# Patient Record
Sex: Female | Born: 1990 | Race: White | Hispanic: Yes | Marital: Single | Smoking: Former smoker
Health system: Southern US, Community
[De-identification: ages and names within clinical notes are randomized; demographics above are authoritative.]

## PROBLEM LIST (undated history)

## (undated) DIAGNOSIS — F909 Attention-deficit hyperactivity disorder, unspecified type: Secondary | ICD-10-CM

## (undated) DIAGNOSIS — F32A Depression, unspecified: Secondary | ICD-10-CM

## (undated) DIAGNOSIS — K311 Adult hypertrophic pyloric stenosis: Secondary | ICD-10-CM

## (undated) DIAGNOSIS — F4310 Post-traumatic stress disorder, unspecified: Secondary | ICD-10-CM

## (undated) DIAGNOSIS — M797 Fibromyalgia: Secondary | ICD-10-CM

## (undated) DIAGNOSIS — K760 Fatty (change of) liver, not elsewhere classified: Secondary | ICD-10-CM

## (undated) DIAGNOSIS — M40202 Unspecified kyphosis, cervical region: Secondary | ICD-10-CM

## (undated) DIAGNOSIS — F329 Major depressive disorder, single episode, unspecified: Secondary | ICD-10-CM

## (undated) DIAGNOSIS — F429 Obsessive-compulsive disorder, unspecified: Secondary | ICD-10-CM

## (undated) DIAGNOSIS — F609 Personality disorder, unspecified: Secondary | ICD-10-CM

## (undated) DIAGNOSIS — F419 Anxiety disorder, unspecified: Secondary | ICD-10-CM

## (undated) DIAGNOSIS — R5600 Simple febrile convulsions: Secondary | ICD-10-CM

## (undated) DIAGNOSIS — F319 Bipolar disorder, unspecified: Secondary | ICD-10-CM

## (undated) DIAGNOSIS — I341 Nonrheumatic mitral (valve) prolapse: Secondary | ICD-10-CM

## (undated) HISTORY — DX: Fibromyalgia: M79.7

## (undated) HISTORY — PX: OTHER SURGICAL HISTORY: SHX169

## (undated) HISTORY — DX: Personality disorder, unspecified: F60.9

## (undated) HISTORY — DX: Adult hypertrophic pyloric stenosis: K31.1

## (undated) HISTORY — DX: Unspecified kyphosis, cervical region: M40.202

## (undated) HISTORY — DX: Attention-deficit hyperactivity disorder, unspecified type: F90.9

---

## 2014-02-17 HISTORY — PX: APPENDECTOMY: SHX54

## 2017-07-10 ENCOUNTER — Other Ambulatory Visit: Payer: Self-pay

## 2017-07-10 ENCOUNTER — Emergency Department
Admission: EM | Admit: 2017-07-10 | Discharge: 2017-07-10 | Disposition: A | Discharge status: Home / Self Care | Payer: Medicaid Other | Attending: Emergency Medicine | Admitting: Emergency Medicine

## 2017-07-10 ENCOUNTER — Encounter: Payer: Self-pay | Admitting: Emergency Medicine

## 2017-07-10 DIAGNOSIS — F319 Bipolar disorder, unspecified: Secondary | ICD-10-CM | POA: Diagnosis not present

## 2017-07-10 DIAGNOSIS — F4310 Post-traumatic stress disorder, unspecified: Secondary | ICD-10-CM

## 2017-07-10 DIAGNOSIS — F419 Anxiety disorder, unspecified: Secondary | ICD-10-CM

## 2017-07-10 DIAGNOSIS — Z760 Encounter for issue of repeat prescription: Secondary | ICD-10-CM

## 2017-07-10 DIAGNOSIS — F4312 Post-traumatic stress disorder, chronic: Secondary | ICD-10-CM | POA: Insufficient documentation

## 2017-07-10 DIAGNOSIS — F429 Obsessive-compulsive disorder, unspecified: Secondary | ICD-10-CM

## 2017-07-10 HISTORY — DX: Anxiety disorder, unspecified: F41.9

## 2017-07-10 HISTORY — DX: Bipolar disorder, unspecified: F31.9

## 2017-07-10 HISTORY — DX: Obsessive-compulsive disorder, unspecified: F42.9

## 2017-07-10 HISTORY — DX: Post-traumatic stress disorder, unspecified: F43.10

## 2017-07-10 MED ORDER — GABAPENTIN 400 MG PO CAPS: 400.0000 mg | ORAL_CAPSULE | Freq: Three times a day (TID) | ORAL | 2 refills | Status: DC

## 2017-07-10 MED ORDER — ATOMOXETINE HCL 25 MG PO CAPS: 25.0000 mg | ORAL_CAPSULE | Freq: Every day | ORAL | 2 refills | Status: DC

## 2017-07-10 MED ORDER — RISPERIDONE 2 MG PO TABS: 2.0000 mg | ORAL_TABLET | Freq: Every day | ORAL | 2 refills | Status: DC

## 2017-07-10 NOTE — ED Notes (Signed)
Pateint needs medication refill on gabapentin 400 mg TID, risperidone 2mg  at bedtime, atomoxetine 25 mg QD.

## 2017-07-10 NOTE — ED Triage Notes (Signed)
Arrive for medication refill.  States has appointment set up for February 1st with [facility] [place].

## 2017-07-10 NOTE — ED Provider Notes (Signed)
[facility] Emergency Department Provider Note  ____________________________________________  Time seen: Approximately 4:22 PM  I have reviewed the triage vital signs and the nursing notes.   HISTORY  Chief Complaint Medication Refill    HPI Courtney [name] is a 26 y.o. female who presents the emergency department requesting medication refill.  Patient needs medication refill on gabapentin 400 mg TID, risperidone 2mg  at bedtime, atomoxetine 25 mg QD. Patient just moved from [place], is establishing care with [facility] but the first available appointment was February 1.  Patient reports that she will be out of her medications prior to this time.  Patient reports that she has been on this dose for long-term with good effects. Patient has a history of anxiety, bipolar disorder, OCD, PTSD.  Patient denies any suicidal or homicidal ideations.  No other complaints at this time.    Past Medical History:  Diagnosis Date  . Anxiety   . Bipolar 1 disorder (HCC)   . OCD (obsessive compulsive disorder)   . PTSD (post-traumatic stress disorder)     There are no active problems to display for this patient.   History reviewed. No pertinent surgical history.  Prior to Admission medications   Medication Sig Start Date End Date Taking? Authorizing Provider  atomoxetine (STRATTERA) 25 MG capsule Take 1 capsule (25 mg total) by mouth daily. 07/10/17 07/10/18  Kanen Mottola, Delorise Royals, PA-C  gabapentin (NEURONTIN) 400 MG capsule Take 1 capsule (400 mg total) by mouth 3 (three) times daily. 07/10/17 07/10/18  Genita Nilsson, Delorise Royals, PA-C  risperiDONE (RISPERDAL) 2 MG tablet Take 1 tablet (2 mg total) by mouth at bedtime. 07/10/17 07/10/18  Tyreisha Ungar, Delorise Royals, PA-C    Allergies Concerta [methylphenidate hcl er (cd)] and Vyvanse [lisdexamfetamine]  No family history on file.  Social History Social History   Tobacco Use  . Smoking status: Never Smoker  . Smokeless tobacco:  Never Used  Substance Use Topics  . Alcohol use: Not on file  . Drug use: Not on file     Review of Systems  Constitutional: No fever/chills Eyes: No visual changes.  Cardiovascular: no chest pain. Respiratory: no cough. No SOB. Gastrointestinal: No abdominal pain.  No nausea, no vomiting.  No diarrhea.  No constipation. Musculoskeletal: Negative for musculoskeletal pain. Skin: Negative for rash, abrasions, lacerations, ecchymosis. Neurological: Negative for headaches, focal weakness or numbness. Psychological: History of bipolar, OCD, PTSD, anxiety.  Patient denies any suicidal or homicidal ideations.  No complaints other than necessary medication refill. 10-point ROS otherwise negative.  ____________________________________________   PHYSICAL EXAM:  VITAL SIGNS: ED Triage Vitals [07/10/17 1540]  Enc Vitals Group     BP      Pulse      Resp      Temp      Temp src      SpO2      Weight 140 lb (63.5 kg)     Height 5\' 1"  (1.549 m)     Head Circumference      Peak Flow      Pain Score      Pain Loc      Pain Edu?      Excl. in GC?      Constitutional: Alert and oriented. Well appearing and in no acute distress. Eyes: Conjunctivae are normal. PERRL. EOMI. Head: Atraumatic. ENT:      Ears:       Nose: No congestion/rhinnorhea.      Mouth/Throat: Mucous membranes are moist.  Neck: No  stridor.    Cardiovascular: Normal rate, regular rhythm. Normal S1 and S2.  Good peripheral circulation. Respiratory: Normal respiratory effort without tachypnea or retractions. Lungs CTAB. Good air entry to the bases with no decreased or absent breath sounds. Musculoskeletal: Full range of motion to all extremities. No gross deformities appreciated. Neurologic:  Normal speech and language. No gross focal neurologic deficits are appreciated.  Skin:  Skin is warm, dry and intact. No rash noted. Psychiatric: Mood and affect are normal. Speech and behavior are normal. Patient exhibits  appropriate insight and judgement.   ____________________________________________   LABS (all labs ordered are listed, but only abnormal results are displayed)  Labs Reviewed - No data to display ____________________________________________  EKG   ____________________________________________  RADIOLOGY   No results found.  ____________________________________________    PROCEDURES  Procedure(s) performed:    Procedures    Medications - No data to display   ____________________________________________   INITIAL IMPRESSION / ASSESSMENT AND PLAN / ED COURSE  Pertinent labs & imaging results that were available during my care of the patient were reviewed by me and considered in my medical decision making (see chart for details).  Review of the [place] CSRS was performed in accordance of the [facility] prior to dispensing any controlled drugs.     Patient's diagnosis is consistent with medication refill.  Patient has no complaints just needs medication refill until she can establish with new care.  Patient just [place] from [place] Patient will be discharged home with prescriptions for Strattera, Neurontin, Risperdal. Patient is to follow up with psychiatry to establish new care or primary care as needed or otherwise directed. Patient is given ED precautions to return to the ED for any worsening or new symptoms.     ____________________________________________  FINAL CLINICAL IMPRESSION(S) / ED DIAGNOSES  Final diagnoses:  Medication refill  Anxiety  Bipolar 1 disorder (HCC)  Obsessive-compulsive disorder, unspecified type  PTSD (post-traumatic stress disorder)      NEW MEDICATIONS STARTED DURING THIS VISIT:  ED Discharge Orders        Ordered    gabapentin (NEURONTIN) 400 MG capsule  3 times daily     07/10/17 1648    risperiDONE (RISPERDAL) 2 MG tablet  Daily at bedtime     07/10/17 1648    atomoxetine (STRATTERA) 25 MG capsule  Daily     07/10/17  1648          This chart was dictated using voice recognition software/Dragon. Despite best efforts to proofread, errors can occur which can change the meaning. Any change was purely unintentional.    Racheal PatchesCuthriell, Mattisen Pohlmann D, PA-C 07/10/17 1656    Arnaldo NatalMalinda, Paul F, MD 07/10/17 [phone]

## 2017-10-16 LAB — LIPID PANEL
Cholesterol: 100 (ref 0–200)
HDL: 57 (ref 35–70)
LDL Cholesterol: 32
Triglycerides: 54 (ref 40–160)

## 2017-10-16 LAB — HM PAP SMEAR

## 2017-10-16 LAB — TSH: TSH: 0.23 — AB (ref 0.41–5.90)

## 2017-10-16 LAB — HEMOGLOBIN A1C: Hemoglobin A1C: 5.9

## 2017-11-10 ENCOUNTER — Other Ambulatory Visit: Payer: Self-pay

## 2017-11-10 ENCOUNTER — Encounter: Payer: Self-pay | Admitting: Emergency Medicine

## 2017-11-10 ENCOUNTER — Emergency Department
Admission: EM | Admit: 2017-11-10 | Discharge: 2017-11-10 | Disposition: A | Discharge status: Home / Self Care | Payer: Medicaid Other | Attending: Emergency Medicine | Admitting: Emergency Medicine

## 2017-11-10 DIAGNOSIS — F419 Anxiety disorder, unspecified: Secondary | ICD-10-CM | POA: Diagnosis not present

## 2017-11-10 DIAGNOSIS — F319 Bipolar disorder, unspecified: Secondary | ICD-10-CM | POA: Insufficient documentation

## 2017-11-10 DIAGNOSIS — B9689 Other specified bacterial agents as the cause of diseases classified elsewhere: Secondary | ICD-10-CM

## 2017-11-10 DIAGNOSIS — R519 Headache, unspecified: Secondary | ICD-10-CM

## 2017-11-10 DIAGNOSIS — R51 Headache: Secondary | ICD-10-CM | POA: Diagnosis not present

## 2017-11-10 DIAGNOSIS — R300 Dysuria: Secondary | ICD-10-CM | POA: Diagnosis present

## 2017-11-10 DIAGNOSIS — N760 Acute vaginitis: Secondary | ICD-10-CM | POA: Insufficient documentation

## 2017-11-10 DIAGNOSIS — Z79899 Other long term (current) drug therapy: Secondary | ICD-10-CM | POA: Insufficient documentation

## 2017-11-10 LAB — POCT PREGNANCY, URINE: Preg Test, Ur: NEGATIVE

## 2017-11-10 LAB — URINALYSIS, COMPLETE (UACMP) WITH MICROSCOPIC
Bacteria, UA: NONE SEEN
Bilirubin Urine: NEGATIVE
Glucose, UA: NEGATIVE mg/dL
Hgb urine dipstick: NEGATIVE
Ketones, ur: 5 mg/dL — AB
Leukocytes, UA: NEGATIVE
Nitrite: NEGATIVE
Protein, ur: NEGATIVE mg/dL
Specific Gravity, Urine: 1.021 (ref 1.005–1.030)
pH: 6 (ref 5.0–8.0)

## 2017-11-10 LAB — WET PREP, GENITAL
Sperm: NONE SEEN
Trich, Wet Prep: NONE SEEN
Yeast Wet Prep HPF POC: NONE SEEN

## 2017-11-10 LAB — CHLAMYDIA/NGC RT PCR (ARMC ONLY)
Chlamydia Tr: NOT DETECTED
N gonorrhoeae: NOT DETECTED

## 2017-11-10 MED ORDER — METRONIDAZOLE 500 MG PO TABS
2000.0000 mg | ORAL_TABLET | Freq: Once | ORAL | Status: AC
  Administered 2017-11-10: 2000 mg via ORAL
  Filled 2017-11-10: qty 4

## 2017-11-10 MED ORDER — ONDANSETRON 4 MG PO TBDP
4.0000 mg | ORAL_TABLET | Freq: Once | ORAL | Status: AC
  Administered 2017-11-10: 4 mg via ORAL
  Filled 2017-11-10: qty 1

## 2017-11-10 MED ORDER — BUTALBITAL-APAP-CAFFEINE 50-325-40 MG PO TABS: 1.0000 | ORAL_TABLET | Freq: Four times a day (QID) | ORAL | 0 refills | Status: DC | PRN

## 2017-11-10 NOTE — ED Triage Notes (Signed)
Presents with some dysuria and slight vaginal discharge for about 1 week  Also has had some intermittent left sided headache   No n/v/d or fever  States she gotten new  glasses in Oct

## 2017-11-10 NOTE — ED Provider Notes (Signed)
[facility] Emergency Department Provider Note   ____________________________________________   First MD Initiated Contact with Patient 11/10/17 1227     (approximate)  I have reviewed the triage vital signs and the nursing notes.   HISTORY  Chief Complaint Dysuria and Headache   HPI Courtney [name] is a 27 y.o. female who presents to the emergency department for treatment of multiple medical complaints that include dysuria, vaginal discharge, and headache. She has a long history of headaches. This one started 2 days ago.  Patient states that she has an appointment scheduled to see a neurologist.  This headache is no different than her typical headache.  She has not attempted any alleviating measures.  Vaginal discharge started approximately 1 week ago.  She has some dysuria as well.  She has had a change in sexual partner and reports unprotected intercourse.  Past Medical History:  Diagnosis Date  . Anxiety   . Bipolar 1 disorder (HCC)   . OCD (obsessive compulsive disorder)   . PTSD (post-traumatic stress disorder)     There are no active problems to display for this patient.   History reviewed. No pertinent surgical history.  Prior to Admission medications   Medication Sig Start Date End Date Taking? Authorizing Provider  amphetamine-dextroamphetamine (ADDERALL) 30 MG tablet Take 30 mg by mouth daily.   Yes [provider]  atomoxetine (STRATTERA) 25 MG capsule Take 1 capsule (25 mg total) by mouth daily. 07/10/17 07/10/18  Cuthriell, Delorise Royals, PA-C  butalbital-acetaminophen-caffeine (FIORICET, ESGIC) 50-325-40 MG tablet Take 1 tablet by mouth every 6 (six) hours as needed for headache. 11/10/17 11/10/18  Darvell Monteforte, Rulon Eisenmenger B, FNP  gabapentin (NEURONTIN) 400 MG capsule Take 1 capsule (400 mg total) by mouth 3 (three) times daily. 07/10/17 07/10/18  Cuthriell, Delorise Royals, PA-C  risperiDONE (RISPERDAL) 2 MG tablet Take 1 tablet (2 mg total) by  mouth at bedtime. 07/10/17 07/10/18  Cuthriell, Delorise Royals, PA-C    Allergies Concerta [methylphenidate hcl er (cd)] and Vyvanse [lisdexamfetamine]  No family history on file.  Social History Social History   Tobacco Use  . Smoking status: Never Smoker  . Smokeless tobacco: Never Used  Substance Use Topics  . Alcohol use: Not on file    Comment: rarely  . Drug use: Yes    Types: Marijuana    Comment: occasional    Review of Systems  Constitutional: No fever/chills Eyes: No visual changes. ENT: No sore throat. Cardiovascular: Denies chest pain. Respiratory: Denies shortness of breath. Gastrointestinal: No abdominal pain.  Occasional nausea, no vomiting.  No diarrhea.  No constipation. Genitourinary: Positive for dysuria. Musculoskeletal: Negative for back pain. Skin: Negative for rash. Neurological: Positive for headaches, negative for focal weakness or numbness. ____________________________________________   PHYSICAL EXAM:  VITAL SIGNS: ED Triage Vitals  Enc Vitals Group     BP 11/10/17 1239 98/60     Pulse Rate 11/10/17 1239 100     Resp 11/10/17 1239 20     Temp 11/10/17 1239 97.7 F (36.5 C)     Temp Source 11/10/17 1239 Oral     SpO2 11/10/17 1239 98 %     Weight 11/10/17 1222 200 lb (90.7 kg)     Height 11/10/17 1222 4\' 11"  (1.499 m)     Head Circumference --      Peak Flow --      Pain Score 11/10/17 1221 4     Pain Loc --      Pain Edu? --  Excl. in GC? --     Constitutional: Alert and oriented. Well appearing and in no acute distress. Eyes: Conjunctivae are normal. PERRL. EOMI. Head: Atraumatic. Nose: No congestion/rhinnorhea. Mouth/Throat: Mucous membranes are moist.  Oropharynx non-erythematous. Neck: No stridor.   Respiratory: Normal respiratory effort.  No retractions. Gastrointestinal: Soft and nontender. No distention. No abdominal bruits. No CVA tenderness.  Pelvic exam reveals a thin, white frothy vaginal discharge.  Cervix is  closed.  No active bleeding noted.  No cervical motion tenderness. Musculoskeletal: No lower extremity tenderness nor edema.  No joint effusions. Neurologic:  Normal speech and language. No gross focal neurologic deficits are appreciated. No gait instability. Skin:  Skin is warm, dry and intact. No rash noted. Psychiatric: Mood and affect are normal. Speech and behavior are normal.  ____________________________________________   LABS (all labs ordered are listed, but only abnormal results are displayed)  Labs Reviewed  WET PREP, GENITAL - Abnormal; Notable for the following components:      Result Value   Clue Cells Wet Prep HPF POC PRESENT (*)    WBC, Wet Prep HPF POC FEW (*)    All other components within normal limits  URINALYSIS, COMPLETE (UACMP) WITH MICROSCOPIC - Abnormal; Notable for the following components:   Color, Urine YELLOW (*)    APPearance CLEAR (*)    Ketones, ur 5 (*)    Squamous Epithelial / LPF 0-5 (*)    All other components within normal limits  CHLAMYDIA/NGC RT PCR ([facility] ONLY)  POCT PREGNANCY, URINE  POC URINE PREG, ED   ____________________________________________  EKG  Not indicated ____________________________________________  RADIOLOGY  ED MD interpretation:   Official radiology report(s): No results found.  ____________________________________________   PROCEDURES  Procedure(s) performed: None  Procedures  Critical Care performed: No  ____________________________________________   INITIAL IMPRESSION / ASSESSMENT AND PLAN / ED COURSE  As part of my medical decision making, I reviewed the following data within the electronic MEDICAL RECORD NUMBER    27 year old female presenting to the emergency department for treatment and evaluation of headache and vaginal discharge.  Headache was mentioned more as a side note then the main reason for coming to the emergency department today.  She was encouraged to follow-up with neurology  as she has already scheduled.  She was encouraged to take Tylenol or ibuprofen for the headache until further medication management as advised by the specialist.  Pelvic exam was completed and specimen sent to the lab.  Symptoms and exam are most likely consistent with bacterial vaginosis.  ----------------------------------------- 3:09 PM on 11/10/2017 ----------------------------------------- Wet prep results consistent with symptoms and exam.  She was given 2 g of Flagyl plus Zofran while here in the emergency department. She was advised to return to the ER for symptoms of concern if unable to schedule an appointment with the health department. ____________________________________________   FINAL CLINICAL IMPRESSION(S) / ED DIAGNOSES  Final diagnoses:  Bacterial vaginosis  Nonintractable headache, unspecified chronicity pattern, unspecified headache type     ED Discharge Orders        Ordered    butalbital-acetaminophen-caffeine (FIORICET, ESGIC) 50-325-40 MG tablet  Every 6 hours PRN     11/10/17 1455       Note:  This document was prepared using Dragon voice recognition software and may include unintentional dictation errors.    Chinita Pester, FNP 11/10/17 1512    [name], MD 11/10/17 [phone]

## 2017-12-31 ENCOUNTER — Ambulatory Visit: Payer: Medicaid Other | Admitting: Family Medicine

## 2017-12-31 ENCOUNTER — Encounter: Payer: Self-pay | Admitting: Family Medicine

## 2017-12-31 VITALS — BP 108/62 | HR 93 | Temp 98.1°F | Resp 16 | Ht 60.0 in | Wt 196.4 lb

## 2017-12-31 DIAGNOSIS — F429 Obsessive-compulsive disorder, unspecified: Secondary | ICD-10-CM | POA: Insufficient documentation

## 2017-12-31 DIAGNOSIS — F419 Anxiety disorder, unspecified: Secondary | ICD-10-CM | POA: Insufficient documentation

## 2017-12-31 DIAGNOSIS — F902 Attention-deficit hyperactivity disorder, combined type: Secondary | ICD-10-CM | POA: Insufficient documentation

## 2017-12-31 DIAGNOSIS — B372 Candidiasis of skin and nail: Secondary | ICD-10-CM

## 2017-12-31 DIAGNOSIS — F312 Bipolar disorder, current episode manic severe with psychotic features: Secondary | ICD-10-CM | POA: Insufficient documentation

## 2017-12-31 DIAGNOSIS — Z79899 Other long term (current) drug therapy: Secondary | ICD-10-CM

## 2017-12-31 DIAGNOSIS — K760 Fatty (change of) liver, not elsewhere classified: Secondary | ICD-10-CM | POA: Diagnosis not present

## 2017-12-31 DIAGNOSIS — K5909 Other constipation: Secondary | ICD-10-CM

## 2017-12-31 DIAGNOSIS — Z23 Encounter for immunization: Secondary | ICD-10-CM

## 2017-12-31 DIAGNOSIS — R1031 Right lower quadrant pain: Secondary | ICD-10-CM

## 2017-12-31 DIAGNOSIS — F909 Attention-deficit hyperactivity disorder, unspecified type: Secondary | ICD-10-CM | POA: Insufficient documentation

## 2017-12-31 DIAGNOSIS — F4310 Post-traumatic stress disorder, unspecified: Secondary | ICD-10-CM | POA: Insufficient documentation

## 2017-12-31 DIAGNOSIS — M797 Fibromyalgia: Secondary | ICD-10-CM | POA: Diagnosis not present

## 2017-12-31 DIAGNOSIS — F319 Bipolar disorder, unspecified: Secondary | ICD-10-CM

## 2017-12-31 DIAGNOSIS — Z13220 Encounter for screening for lipoid disorders: Secondary | ICD-10-CM

## 2017-12-31 DIAGNOSIS — R7303 Prediabetes: Secondary | ICD-10-CM

## 2017-12-31 MED ORDER — KETOCONAZOLE 2 % EX SHAM: 1.0000 "application " | MEDICATED_SHAMPOO | Freq: Every day | CUTANEOUS | 0 refills | Status: DC

## 2017-12-31 NOTE — Progress Notes (Signed)
Name: Courtney [name]   MRN: [id number]    DOB: 1990/10/30   Date:12/31/2017       Progress Note  Subjective  Chief Complaint  Chief Complaint  Patient presents with  . Establish Care    HPI  Morbid obesity: weight started to creep up at age 27, max weight of 237 lbs in 2015, lost while homeless. January  2017 down to 157 lbs, she has been gaining weight again, pre-diabetes and would like to go back on Adipex, explained that we do not prescribe that, but we can refer her to dietician and consider saxenda or victoza. - she denies family history of thyroid cancer or personal history of pancreatitis   Abdominal pain/chronic constipation: she has intermittent abdominal pain, still daily, it can be RUQ, RLQ, also has dyspepsia, bloating, epigastric, burning sensation on anterior chest and some nausea. She also has constipation and strain to have bowel movements, bowel movements every other day, [place] can vary, but currently 3-4. She has a history of black stools but not over the past  6 months.   Bipolar disorder: seeing psychiatrist at [facility] , Dr. Arlester Marker, compliant with medication, she states currently not sleeping well, feels like having hypomania, denies suicidal thoughts or ideation.   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 She was getting medication from psychiatrist in the past. Advised to discuss it with current psychiatrist.   Pre-diabetes: she had labs done at [facility] back in March and it was 5.9%. Denies polyphagia - eats because she gets bored, she has  polydipsia and  Polyuria, states her medications makes her feel thirsty all the time.    Patient Active Problem List   Diagnosis Date Noted  . PTSD (post-traumatic stress disorder) 12/31/2017  . Bipolar 1 disorder (HCC) 12/31/2017  . Anxiety 12/31/2017  . OCD (obsessive compulsive disorder) 12/31/2017  . ADHD 12/31/2017    Past Surgical  History:  Procedure Laterality Date  . APPENDECTOMY  02/17/2014    Family History  Problem Relation Age of Onset  . COPD Mother   . Hypertension Mother   . Asthma Mother   . Arthritis Mother   . Pancreatic cancer Mother        slow growing  . ADD / ADHD Mother   . Bipolar disorder Father   . Arthritis Father   . Hypercholesterolemia Father   . Asthma Brother   . Hernia Brother   . ADD / ADHD Brother   . Hypertension Maternal Grandmother   . Heart disease Maternal Grandmother 77  . Rheumatic fever Maternal Grandmother   . Heart attack Maternal Grandmother        Bypass Surgery  . Pancreatic cancer Maternal Grandfather   . Liver cancer Maternal Grandfather   . Obesity Paternal Grandmother     Social History   Socioeconomic History  . Marital status: Single    Spouse name: Not on file  . Number of children: 0  . Years of education: Not on file  . Highest education level: Not on file  Occupational History  . Occupation: disability     Comment: mental health   Social Needs  . Financial resource strain: Somewhat hard  . Food insecurity:    Worry: Never true    Inability: Never true  . Transportation needs:    Medical: No    Non-medical: No  Tobacco Use  . Smoking status: Light Tobacco Smoker  Years: 10.00    Types: Cigarettes    Start date: 01/01/2008  . Smokeless tobacco: Never Used  . Tobacco comment: smokes very occasionally-social smoker or stressed out  Substance and Sexual Activity  . Alcohol use: Yes    Comment: occasionally drink a beer  . Drug use: Yes    Types: Marijuana  . Sexual activity: Yes    Partners: Male    Birth control/protection: Condom  Lifestyle  . Physical activity:    Days per week: 5 days    Minutes per session: 60 min  . Stress: Rather much  Relationships  . Social connections:    Talks on phone: More than three times a week    Gets together: Three times a week    Attends religious service: More than 4 times per year     Active member of club or organization: Yes    Attends meetings of clubs or organizations: More than 4 times per year    Relationship status: Never married  . Intimate partner violence:    Fear of current or ex partner: No    Emotionally abused: No    Physically abused: No    Forced sexual activity: No  Other Topics Concern  . Not on file  Social History Narrative   Moved to [place] from [facility] to be closer to family. Parents moved here in 2018.   On disability for bipolar disorder since young age, had to be placed in a group home and foster home because of behavior. She is now living with parents since she decided to take her medications.      Current Outpatient Medications:  .  amphetamine-dextroamphetamine (ADDERALL XR) 10 MG 24 hr capsule, Take 1 capsule by mouth daily., Disp: , Rfl:  .  atomoxetine (STRATTERA) 40 MG capsule, Take 1 capsule by mouth daily., Disp: , Rfl:  .  hydrOXYzine (ATARAX/VISTARIL) 25 MG tablet, Take 25-50 mg by mouth at bedtime as needed for anxiety (Insomnia)., Disp: , Rfl:  .  lamoTRIgine (LAMICTAL) 25 MG tablet, Take 2 tablets by mouth daily., Disp: , Rfl:  .  Omega-3 Fatty Acids (FISH OIL) 500 MG CAPS, Take 1 capsule by mouth daily., Disp: , Rfl:  .  risperiDONE (RISPERDAL) 1 MG tablet, Take 3 tablets by mouth at bedtime., Disp: , Rfl:   Allergies  Allergen Reactions  . Concerta [Methylphenidate Hcl Er (Cd)] Other (See Comments)    Insomnia and Manic   . Vyvanse [Lisdexamfetamine] Other (See Comments)    Bounce off of the wall     ROS  Constitutional: Negative for fever , positive weight change.  Respiratory: Negative for cough and shortness of breath.   Cardiovascular: Negative for chest pain or palpitations.  Gastrointestinal: Negative for abdominal pain, no bowel changes.  Musculoskeletal: Negative for gait problem or joint swelling.  Skin: Negative for rash.  Neurological: Negative for dizziness or headache.  No other specific complaints in a  complete review of systems (except as listed in HPI above).  Objective  Vitals:   12/31/17 1107  BP: 108/62  Pulse: 93  Resp: 16  Temp: 98.1 F (36.7 C)  TempSrc: Oral  SpO2: 97%  Weight: 196 lb 6.4 oz (89.1 kg)  Height: 5' (1.524 m)    Body mass index is 38.36 kg/m.  Physical Exam  Constitutional: Patient appears well-developed and well-nourished. Obese  No distress.  HEENT: head atraumatic, normocephalic, pupils equal and reactive to light,  neck supple, throat within normal limits Cardiovascular: Normal  rate, regular rhythm and normal heart sounds.  No murmur heard. No BLE edema. Pulmonary/Chest: Effort normal and breath sounds normal. No respiratory distress. Abdominal: Soft.  There is no tenderness Muscular skeletal: some trigger points positive  Psychiatric: Patient has a normal mood and affect. behavior is normal. Judgment and thought content normal.  Recent Results (from the past 2160 hour(s))  Urinalysis, Complete w Microscopic     Status: Abnormal   Collection Time: 11/10/17 12:43 PM  Result Value Ref Range   Color, Urine YELLOW (A) YELLOW   APPearance CLEAR (A) CLEAR   Specific Gravity, Urine 1.021 1.005 - 1.030   pH 6.0 5.0 - 8.0   Glucose, UA NEGATIVE NEGATIVE mg/dL   Hgb urine dipstick NEGATIVE NEGATIVE   Bilirubin Urine NEGATIVE NEGATIVE   Ketones, ur 5 (A) NEGATIVE mg/dL   Protein, ur NEGATIVE NEGATIVE mg/dL   Nitrite NEGATIVE NEGATIVE   Leukocytes, UA NEGATIVE NEGATIVE   RBC / HPF 6-30 0 - 5 RBC/hpf   WBC, UA 0-5 0 - 5 WBC/hpf   Bacteria, UA NONE SEEN NONE SEEN   Squamous Epithelial / LPF 0-5 (A) NONE SEEN   Mucus PRESENT     Comment: Performed at [facility], [address] [place], [place] [address]  Pregnancy, urine POC     Status: None   Collection Time: 11/10/17 12:47 PM  Result Value Ref Range   Preg Test, Ur NEGATIVE NEGATIVE    Comment:        THE SENSITIVITY OF THIS METHODOLOGY IS >24 mIU/mL   Wet prep, genital      Status: Abnormal   Collection Time: 11/10/17  1:55 PM  Result Value Ref Range   Yeast Wet Prep HPF POC NONE SEEN NONE SEEN   Trich, Wet Prep NONE SEEN NONE SEEN   Clue Cells Wet Prep HPF POC PRESENT (A) NONE SEEN   WBC, Wet Prep HPF POC FEW (A) NONE SEEN   Sperm NONE SEEN     Comment: Performed at [facility], [address] [place], [place] [address]  Chlamydia/NGC rt PCR ([facility] only)     Status: None   Collection Time: 11/10/17  1:55 PM  Result Value Ref Range   Specimen source GC/Chlam ENDOCERVICAL    Chlamydia Tr NOT DETECTED NOT DETECTED   N gonorrhoeae NOT DETECTED NOT DETECTED    Comment: (NOTE) 100  This methodology has not been evaluated in pregnant women or in 200  patients with a history of hysterectomy. 300 400  This methodology will not be performed on patients less than 65  years of age. Performed at [facility], [address] [place], [place] [address]       PHQ2/9: Depression screen [facility] 2/9 12/31/2017  Decreased Interest 1  Down, Depressed, Hopeless 0  PHQ - 2 Score 1  Altered sleeping 3  Tired, decreased energy 1  Change in appetite 2  Feeling bad or failure about yourself  3  Trouble concentrating 3  Moving slowly or fidgety/restless 3  Suicidal thoughts 2  PHQ-9 Score 18  Difficult doing work/chores Extremely dIfficult   Seeing psychiatrist    Fall Risk: Fall Risk  12/31/2017  Falls in the past year? No     Functional Status Survey: Is the patient deaf or have difficulty hearing?: Yes(clogged ears) Does the patient have difficulty seeing, even when wearing glasses/contacts?: Yes(prescription glasses-trouble seeing a couple of months after getting glasses) Does the patient have difficulty concentrating, remembering, or making  decisions?: Yes Does the patient have difficulty walking or climbing stairs?: No Does the patient have difficulty dressing or bathing?: No Does the patient have difficulty doing errands alone such  as visiting a doctor's office or shopping?: No    Assessment & Plan  1. Bipolar 1 disorder (HCC)  Continue   2. Morbid obesity (HCC)  - Amb ref to Medical Nutrition Therapy-MNT Discussed with the patient the risk posed by an increased BMI. Discussed importance of portion control, calorie counting and at least 150 minutes of physical activity weekly. Avoid sweet beverages and drink more water. Eat at least 6 servings of fruit and vegetables daily   3. Candidal intertrigo  - ketoconazole (NIZORAL) 2 % shampoo; Apply 1 application topically at bedtime. Apply to skin and remove after 5 minutes  Dispense: 120 mL; Refill: 0  4. Chronic constipation  - Ambulatory referral to Gastroenterology  5. Intermittent right lower quadrant abdominal pain  - Ambulatory referral to Gastroenterology  6. Fatty liver disease, nonalcoholic  - Ambulatory referral to Gastroenterology  7. Long-term use of high-risk medication  Reviewed labs with patient done 10/2017 at [facility]   8. Prediabetes   - Hemoglobin A1c  9. Lipid screening  - Lipid panel  10. Need for HPV vaccination  - HPV 9-valent vaccine,Recombinat  11. Need for Tdap vaccination  She states she had it recently

## 2017-12-31 NOTE — Patient Instructions (Signed)
Obesity, Adult  Obesity is the condition of having too much total body fat. Being overweight or obese means that your weight is greater than what is considered healthy for your body size. Obesity is determined by a measurement called BMI. BMI is an estimate of body fat and is calculated from height and weight. For adults, a BMI of 30 or higher is considered obese.  Obesity can eventually lead to other health concerns and major illnesses, including:  · Stroke.  · Coronary artery disease (CAD).  · Type 2 diabetes.  · Some types of cancer, including cancers of the colon, breast, uterus, and gallbladder.  · Osteoarthritis.  · High blood pressure (hypertension).  · High cholesterol.  · Sleep apnea.  · Gallbladder stones.  · Infertility problems.    What are the causes?  The main cause of obesity is taking in (consuming) more calories than your body uses for energy. Other factors that contribute to this condition may include:  · Being born with genes that make you more likely to become obese.  · Having a medical condition that causes obesity. These conditions include:  ? Hypothyroidism.  ? Polycystic ovarian syndrome (PCOS).  ? Binge-eating disorder.  ? Cushing syndrome.  · Taking certain medicines, such as steroids, antidepressants, and seizure medicines.  · Not being physically active (sedentary lifestyle).  · Living where there are limited places to exercise safely or buy healthy foods.  · Not getting enough sleep.    What increases the risk?  The following factors may increase your risk of this condition:  · Having a family history of obesity.  · Being a woman of African-American descent.  · Being a man of Hispanic descent.    What are the signs or symptoms?  Having excessive body fat is the main symptom of this condition.  How is this diagnosed?  This condition may be diagnosed based on:  · Your symptoms.  · Your medical history.  · A physical exam. Your health care provider may measure:  ? Your BMI. If you are an  adult with a BMI between 25 and less than 30, you are considered overweight. If you are an adult with a BMI of 30 or higher, you are considered obese.  ? The distances around your hips and your waist (circumferences). These may be compared to each other to help diagnose your condition.  ? Your skinfold thickness. Your health care provider may gently pinch a fold of your skin and measure it.    How is this treated?  Treatment for this condition often includes changing your lifestyle. Treatment may include some or all of the following:  · Dietary changes. Work with your health care provider and a dietitian to set a weight-loss goal that is healthy and reasonable for you. Dietary changes may include eating:  ? Smaller portions. A portion size is the amount of a particular food that is healthy for you to eat at one time. This varies from person to person.  ? Low-calorie or low-fat options.  ? More whole grains, fruits, and vegetables.  · Regular physical activity. This may include aerobic activity (cardio) and strength training.  · Medicine to help you lose weight. Your health care provider may prescribe medicine if you are unable to lose 1 pound a week after 6 weeks of eating more healthily and doing more physical activity.  · Surgery. Surgical options may include gastric banding and gastric bypass. Surgery may be done if:  ? Other   treatments have not helped to improve your condition.  ? You have a BMI of 40 or higher.  ? You have life-threatening health problems related to obesity.    Follow these instructions at home:    Eating and drinking    · Follow recommendations from your health care provider about what you eat and drink. Your health care provider may advise you to:  ? Limit fast foods, sweets, and processed snack foods.  ? Choose low-fat options, such as low-fat milk instead of whole milk.  ? Eat 5 or more servings of fruits or vegetables every day.  ? Eat at home more often. This gives you more control over  what you eat.  ? Choose healthy foods when you eat out.  ? Learn what a healthy portion size is.  ? Keep low-fat snacks on hand.  ? Avoid sugary drinks, such as soda, fruit juice, iced tea sweetened with sugar, and flavored milk.  ? Eat a healthy breakfast.  · Drink enough water to keep your urine clear or pale yellow.  · Do not go without eating for long periods of time (do not fast) or follow a fad diet. Fasting and fad diets can be unhealthy and even dangerous.  Physical Activity  · Exercise regularly, as told by your health care provider. Ask your health care provider what types of exercise are safe for you and how often you should exercise.  · Warm up and stretch before being active.  · Cool down and stretch after being active.  · Rest between periods of activity.  Lifestyle  · Limit the time that you spend in front of your TV, computer, or video game system.  · Find ways to reward yourself that do not involve food.  · Limit alcohol intake to no more than 1 drink a day for nonpregnant women and 2 drinks a day for men. One drink equals 12 oz of beer, 5 oz of wine, or 1½ oz of hard liquor.  General instructions  · Keep a weight loss journal to keep track of the food you eat and how much you exercise you get.  · Take over-the-counter and prescription medicines only as told by your health care provider.  · Take vitamins and supplements only as told by your health care provider.  · Consider joining a support group. Your health care provider may be able to recommend a support group.  · Keep all follow-up visits as told by your health care provider. This is important.  Contact a health care provider if:  · You are unable to meet your weight loss goal after 6 weeks of dietary and lifestyle changes.  This information is not intended to replace advice given to you by your health care provider. Make sure you discuss any questions you have with your health care provider.  Document Released: 09/04/2004 Document Revised:  12/31/2015 Document Reviewed: 05/16/2015  Elsevier Interactive Patient Education © 2018 Elsevier Inc.

## 2018-01-01 ENCOUNTER — Encounter: Payer: Self-pay | Admitting: Family Medicine

## 2018-01-05 ENCOUNTER — Encounter: Payer: Self-pay | Admitting: Family Medicine

## 2018-01-07 ENCOUNTER — Ambulatory Visit: Payer: Medicaid Other | Admitting: Gastroenterology

## 2018-01-07 ENCOUNTER — Encounter: Payer: Self-pay | Admitting: Gastroenterology

## 2018-01-07 VITALS — BP 102/67 | HR 76 | Temp 97.7°F | Ht 60.0 in | Wt 197.4 lb

## 2018-01-07 DIAGNOSIS — R12 Heartburn: Secondary | ICD-10-CM

## 2018-01-07 DIAGNOSIS — R1013 Epigastric pain: Secondary | ICD-10-CM | POA: Diagnosis not present

## 2018-01-07 DIAGNOSIS — K760 Fatty (change of) liver, not elsewhere classified: Secondary | ICD-10-CM

## 2018-01-07 DIAGNOSIS — K5900 Constipation, unspecified: Secondary | ICD-10-CM | POA: Diagnosis not present

## 2018-01-07 MED ORDER — RANITIDINE HCL 75 MG PO TABS: 75.0000 mg | ORAL_TABLET | Freq: Two times a day (BID) | ORAL | 1 refills | Status: DC

## 2018-01-07 MED ORDER — PSYLLIUM 55.46 % PO POWD: 1.0000 | Freq: Every day | ORAL | 1 refills | Status: DC

## 2018-01-07 NOTE — Patient Instructions (Signed)
Gastroesophageal Reflux Disease, Adult Normally, food travels down the esophagus and stays in the stomach to be digested. If a person has gastroesophageal reflux disease (GERD), food and stomach acid move back up into the esophagus. When this happens, the esophagus becomes sore and swollen (inflamed). Over time, GERD can make small holes (ulcers) in the lining of the esophagus. Follow these instructions at home: Diet  Follow a diet as told by your doctor. You may need to avoid foods and drinks such as: ? Coffee and tea (with or without caffeine). ? Drinks that contain alcohol. ? Energy drinks and sports drinks. ? Carbonated drinks or sodas. ? Chocolate and cocoa. ? Peppermint and mint flavorings. ? Garlic and onions. ? Horseradish. ? Spicy and acidic foods, such as peppers, chili powder, curry powder, vinegar, hot sauces, and BBQ sauce. ? Citrus fruit juices and citrus fruits, such as oranges, lemons, and limes. ? Tomato-based foods, such as red sauce, chili, salsa, and pizza with red sauce. ? Fried and fatty foods, such as donuts, french fries, potato chips, and high-fat dressings. ? High-fat meats, such as hot dogs, rib eye steak, sausage, ham, and bacon. ? High-fat dairy items, such as whole milk, butter, and cream cheese.  Eat small meals often. Avoid eating large meals.  Avoid drinking large amounts of liquid with your meals.  Avoid eating meals during the 2-3 hours before bedtime.  Avoid lying down right after you eat.  Do not exercise right after you eat. General instructions  Pay attention to any changes in your symptoms.  Take over-the-counter and prescription medicines only as told by your doctor. Do not take aspirin, ibuprofen, or other NSAIDs unless your doctor says it is okay.  Do not use any tobacco products, including cigarettes, chewing tobacco, and e-cigarettes. If you need help quitting, ask your doctor.  Wear loose clothes. Do not wear anything tight around  your waist.  Raise (elevate) the head of your bed about 6 inches (15 cm).  Try to lower your stress. If you need help doing this, ask your doctor.  If you are overweight, lose an amount of weight that is healthy for you. Ask your doctor about a safe weight loss goal.  Keep all follow-up visits as told by your doctor. This is important. Contact a doctor if:  You have new symptoms.  You lose weight and you do not know why it is happening.  You have trouble swallowing, or it hurts to swallow.  You have wheezing or a cough that keeps happening.  Your symptoms do not get better with treatment.  You have a hoarse voice. Get help right away if:  You have pain in your arms, neck, jaw, teeth, or back.  You feel sweaty, dizzy, or light-headed.  You have chest pain or shortness of breath.  You throw up (vomit) and your throw up looks like blood or coffee grounds.  You pass out (faint).  Your poop (stool) is bloody or black.  You cannot swallow, drink, or eat. This information is not intended to replace advice given to you by your health care provider. Make sure you discuss any questions you have with your health care provider. Document Released: 01/14/2008 Document Revised: 01/03/2016 Document Reviewed: 11/22/2014 Elsevier Interactive Patient Education  2018 [employer]. High-Fiber Diet Fiber, also called dietary fiber, is a type of carbohydrate found in fruits, vegetables, whole grains, and beans. A high-fiber diet can have many health benefits. Your health care provider may recommend a high-fiber diet  to help:  Prevent constipation. Fiber can make your bowel movements more regular.  Lower your cholesterol.  Relieve hemorrhoids, uncomplicated diverticulosis, or irritable bowel syndrome.  Prevent overeating as part of a weight-loss plan.  Prevent heart disease, type 2 diabetes, and certain cancers.  What is my plan? The recommended daily intake of fiber includes:  38  grams for men under age 43.  30 grams for men over age 91.  25 grams for women under age 6.  21 grams for women over age 68.  You can get the recommended daily intake of dietary fiber by eating a variety of fruits, vegetables, grains, and beans. Your health care provider may also recommend a fiber supplement if it is not possible to get enough fiber through your diet. What do I need to know about a high-fiber diet?  Fiber supplements have not been widely studied for their effectiveness, so it is better to get fiber through food sources.  Always check the fiber content on thenutrition facts label of any prepackaged food. Look for foods that contain at least 5 grams of fiber per serving.  Ask your dietitian if you have questions about specific foods that are related to your condition, especially if those foods are not listed in the following section.  Increase your daily fiber consumption gradually. Increasing your intake of dietary fiber too quickly may cause bloating, cramping, or gas.  Drink plenty of water. Water helps you to digest fiber. What foods can I eat? Grains Whole-grain breads. Multigrain cereal. Oats and oatmeal. Brown rice. Barley. Bulgur wheat. Millet. Bran muffins. Popcorn. Rye wafer crackers. Vegetables Sweet potatoes. Spinach. Kale. Artichokes. Cabbage. Broccoli. Green peas. Carrots. Squash. Fruits Berries. Pears. Apples. Oranges. Avocados. Prunes and raisins. Dried figs. Meats and Other Protein Sources Navy, kidney, pinto, and soy beans. Split peas. Lentils. Nuts and seeds. Dairy Fiber-fortified yogurt. Beverages Fiber-fortified soy milk. Fiber-fortified orange juice. Other Fiber bars. The items listed above may not be a complete list of recommended foods or beverages. Contact your dietitian for more options. What foods are not recommended? Grains White bread. Pasta made with refined flour. White rice. Vegetables Fried potatoes. Canned vegetables.  Well-cooked vegetables. Fruits Fruit juice. Cooked, strained fruit. Meats and Other Protein Sources Fatty cuts of meat. Fried Environmental education officer or fried fish. Dairy Milk. Yogurt. Cream cheese. Sour cream. Beverages Soft drinks. Other Cakes and pastries. Butter and oils. The items listed above may not be a complete list of foods and beverages to avoid. Contact your dietitian for more information. What are some tips for including high-fiber foods in my diet?  Eat a wide variety of high-fiber foods.  Make sure that half of all grains consumed each day are whole grains.  Replace breads and cereals made from refined flour or white flour with whole-grain breads and cereals.  Replace white rice with brown rice, bulgur wheat, or millet.  Start the day with a breakfast that is high in fiber, such as a cereal that contains at least 5 grams of fiber per serving.  Use beans in place of meat in soups, salads, or pasta.  Eat high-fiber snacks, such as berries, raw vegetables, nuts, or popcorn. This information is not intended to replace advice given to you by your health care provider. Make sure you discuss any questions you have with your health care provider. Document Released: 07/28/2005 Document Revised: 01/03/2016 Document Reviewed: 01/10/2014 Elsevier Interactive Patient Education  2018 [employer].   F/u 3 months

## 2018-01-07 NOTE — Progress Notes (Signed)
Courtney [name] [address]  [address]  [place], [place] [address]  Main: [phone]  Fax: [phone]   Gastroenterology Consultation  Referring Provider:     [name], MD Primary Care Physician:  [name], MD Primary Gastroenterologist:  Dr. Melodie [name] Reason for Consultation:     Chronic abdominal pain, fatty liver        HPI:   Chief complaint: Abdominal pain  Courtney [name] is a 27 y.o. y/o female referred for consultation & management  by Dr. [name], MD.    Reports history of chronic abdominal pain, multiple locations, midepigastric, right upper quadrant, left upper quadrant, left lower quadrant.  Cramping, 4/10, intermittent, unrelated to meals.  Reports heartburn.  Is not on any acid reducers.  Denies dysphagia.  Reports taking Protonix for 1 week about 2 years ago.  Does not know if it helped.  No nausea or vomiting.  No weight loss.  Takes magnesium for constipation, and has 1-2 loose stools every other day with it.  Patient previously lived in [place], and states she saw a GI there in 2016 due to abdominal pain.  She states an EGD and colonoscopy were scheduled, and she could not tolerate the colonoscopy prep, GoLYTELY, and her procedures were canceled.  She also reports having appendicitis at the time and underwent appendectomy.    States, she had an ultrasound of the liver by them at that time as well, and it showed fatty liver.  She was told it was due to obesity and was encouraged to lose weight.    Reports history of pyloric stenosis as a child.  No NSAID use.  Past Medical History:  Diagnosis Date  . ADHD    As a child  . Anxiety   . Bipolar 1 disorder (HCC)   . OCD (obsessive compulsive disorder)   . PTSD (post-traumatic stress disorder)   . Pyloric stenosis     Past Surgical History:  Procedure Laterality Date  . APPENDECTOMY  02/17/2014    Prior to Admission medications   Medication Sig Start Date End  Date Taking? Authorizing Provider  amphetamine-dextroamphetamine (ADDERALL XR) 10 MG 24 hr capsule Take 1 capsule by mouth daily. 12/04/17 01/03/18  [provider]  atomoxetine (STRATTERA) 40 MG capsule Take 1 capsule by mouth daily. 12/04/17 03/04/18  [provider]  hydrOXYzine (ATARAX/VISTARIL) 25 MG tablet Take 25-50 mg by mouth at bedtime as needed for anxiety (Insomnia).    [provider]  ketoconazole (NIZORAL) 2 % shampoo Apply 1 application topically at bedtime. Apply to skin and remove after 5 minutes 12/31/17   [name], MD  lamoTRIgine (LAMICTAL) 25 MG tablet Take 2 tablets by mouth daily. 12/04/17 04/03/18  [provider]  Omega-3 Fatty Acids (FISH OIL) 500 MG CAPS Take 1 capsule by mouth daily.    [provider]  risperiDONE (RISPERDAL) 1 MG tablet Take 3 tablets by mouth at bedtime. 12/04/17 03/04/18  [provider]    Family History  Problem Relation Age of Onset  . COPD Mother   . Hypertension Mother   . Asthma Mother   . Arthritis Mother   . Pancreatic cancer Mother        slow growing  . ADD / ADHD Mother   . Bipolar disorder Father   . Arthritis Father   . Hypercholesterolemia Father   . Asthma Brother   . Hernia Brother   . ADD / ADHD Brother   . Hypertension Maternal  Grandmother   . Heart disease Maternal Grandmother 19  . Rheumatic fever Maternal Grandmother   . Heart attack Maternal Grandmother        Bypass Surgery  . Pancreatic cancer Maternal Grandfather   . Liver cancer Maternal Grandfather   . Obesity Paternal Grandmother      Social History   Tobacco Use  . Smoking status: Former Smoker    Packs/day: 0.25    Years: 10.00    Pack years: 2.50    Types: Cigarettes    Start date: 01/01/2008  . Smokeless tobacco: Never Used  Substance Use Topics  . Alcohol use: Yes    Comment: occasionally drink a beer  . Drug use: Yes    Types: Marijuana    Allergies as of 01/07/2018 - Review  Complete 12/31/2017  Allergen Reaction Noted  . Concerta [methylphenidate hcl er (cd)] Other (See Comments) 07/10/2017  . Vyvanse [lisdexamfetamine] Other (See Comments) 07/10/2017    Review of Systems:    All systems reviewed and negative except where noted in HPI.   Physical Exam:  LMP 12/28/2017  Patient's last menstrual period was 12/28/2017.  Vitals:   01/07/18 1326  BP: 102/67  Pulse: 76  Temp: 97.7 F (36.5 C)  TempSrc: Oral  Weight: 197 lb 6.4 oz (89.5 kg)  Height: 5' (1.524 m)    Psych:  Alert and cooperative. Normal mood and affect. General:   Alert,  Well-developed, well-nourished, pleasant and cooperative in NAD Head:  Normocephalic and atraumatic. Eyes:  Sclera clear, no icterus.   Conjunctiva pink. Ears:  Normal auditory acuity. Nose:  No deformity, discharge, or lesions. Mouth:  No deformity or lesions,oropharynx pink & moist. Neck:  Supple; no masses or thyromegaly. Lungs:  Respirations even and unlabored.  Clear throughout to auscultation.   No wheezes, crackles, or rhonchi. No acute distress. Heart:  Regular rate and rhythm; no murmurs, clicks, rubs, or gallops. Abdomen:  Normal bowel sounds.  No bruits.  Soft, non-tender and non-distended without masses, hepatosplenomegaly or hernias noted.  No guarding or rebound tenderness.    Msk:  Symmetrical without gross deformities. Good, equal movement & strength bilaterally. Pulses:  Normal pulses noted. Extremities:  No clubbing or edema.  No cyanosis. Neurologic:  Alert and oriented x3;  grossly normal neurologically. Skin:  Intact without significant lesions or rashes. No jaundice. Lymph Nodes:  No significant cervical adenopathy. Psych:  Alert and cooperative. Normal mood and affect.   Labs: CBC, CMP reviewed in care everywhere  Imaging Studies: No results found.  Assessment and Plan:   Dewey Neukam is a 27 y.o. y/o female has been referred for chronic abdominal pain, and fatty liver  Abdominal  pain, likely due to chronic constipation versus GERD versus dyspepsia  Patient educated extensively on acid reflux lifestyle modification, including buying a bed wedge, not eating 3 hrs before bedtime, diet modifications, and handout given for the same.  Will check stool for H. Pylori Start Zantac twice daily for heartburn.  Patient asked to start this only after providing her stool sample for H. pylori  Due to loose stools, likely due to magnesium use, will also check C. difficile and GI stool panel  High-fiber diet Patient asked to stop taking magnesium MiraLAX or Metamucil daily with goal of 1-2 soft bowel movements daily.  If not at goal, patient instructed to increase dose to twice daily.  If loose stools with the medication, patient asked to decrease the medication to every other day, or half dose  daily.  Patient verbalized understanding No alarm symptoms present to indicate colonoscopy at this time  Last labs, noted in care everywhere, from March 2019 show normal liver enzymes, normal platelets, normal bilirubin and albumin.  No clinical, or chemical evidence of cirrhosis Patient educated about avoiding hepatotoxic drugs, including alcohol Will obtain liver ultrasound to evaluate reported history of fatty liver   Dr Courtney [name]

## 2018-01-08 ENCOUNTER — Other Ambulatory Visit
Admission: RE | Admit: 2018-01-08 | Discharge: 2018-01-08 | Disposition: A | Discharge status: Home / Self Care | Payer: Medicaid Other | Source: Ambulatory Visit | Attending: Gastroenterology | Admitting: Gastroenterology

## 2018-01-08 ENCOUNTER — Telehealth: Payer: Self-pay | Admitting: Gastroenterology

## 2018-01-08 DIAGNOSIS — K5900 Constipation, unspecified: Secondary | ICD-10-CM | POA: Insufficient documentation

## 2018-01-08 DIAGNOSIS — K760 Fatty (change of) liver, not elsewhere classified: Secondary | ICD-10-CM

## 2018-01-08 LAB — GASTROINTESTINAL PANEL BY PCR, STOOL (REPLACES STOOL CULTURE)

## 2018-01-08 LAB — C DIFFICILE QUICK SCREEN W PCR REFLEX
C Diff antigen: NEGATIVE
C Diff interpretation: NOT DETECTED
C Diff toxin: NEGATIVE

## 2018-01-08 NOTE — Telephone Encounter (Signed)
Pt left vm she would like to see if she can get her sonogram done closer to [employer] area  Not [place]

## 2018-01-08 NOTE — Telephone Encounter (Signed)
PT IS RETURNING CALL FOR DEBBIE SHE STATES SHE COULD NOT UNDERSTAND HER VOICE MAIL PLEASE CALL PT

## 2018-01-08 NOTE — Telephone Encounter (Signed)
Left message for pt to contact ultrasound, as she spoke to them regarding the location and appt.

## 2018-01-08 NOTE — Telephone Encounter (Signed)
After speaking with pt it was noted that I gave her [facility] instead of [facility]. Pt given # [phone] this phone call. Pt was very kind and understanding and will contact [facility] to cancel that appt.

## 2018-01-12 ENCOUNTER — Other Ambulatory Visit: Payer: Self-pay | Admitting: Gastroenterology

## 2018-01-12 DIAGNOSIS — A048 Other specified bacterial intestinal infections: Secondary | ICD-10-CM

## 2018-01-12 LAB — H. PYLORI ANTIGEN, STOOL: H. Pylori Stool Ag, Eia: POSITIVE — AB

## 2018-01-12 MED ORDER — AMOXICILLIN 500 MG PO TABS: 1000.0000 mg | ORAL_TABLET | Freq: Two times a day (BID) | ORAL | 0 refills | Status: AC

## 2018-01-12 MED ORDER — OMEPRAZOLE 20 MG PO CPDR: 20.0000 mg | DELAYED_RELEASE_CAPSULE | Freq: Two times a day (BID) | ORAL | 0 refills | Status: DC

## 2018-01-12 MED ORDER — CLARITHROMYCIN 250 MG PO TABS: 500.0000 mg | ORAL_TABLET | Freq: Two times a day (BID) | ORAL | 0 refills | Status: AC

## 2018-01-13 ENCOUNTER — Telehealth: Payer: Self-pay | Admitting: Gastroenterology

## 2018-01-13 NOTE — Telephone Encounter (Signed)
Pt left vm she states her pharmacy called with several rx refill notifications she was wondering if she was prescribted those because of her results of the stool sample one rx was amoxcicilin please call pt

## 2018-01-13 NOTE — Telephone Encounter (Signed)
Pt called

## 2018-01-14 ENCOUNTER — Telehealth: Payer: Self-pay

## 2018-01-14 NOTE — Telephone Encounter (Signed)
Copied from CRM [id number]. Topic: General - Other >> Jan 14, 2018 11:16 AM Trula Slade wrote: Reason for CRM:   Patient wanted the provider to know that Medicaid does not cover a Nutritionist and a Dietician.  Please advise.

## 2018-01-15 ENCOUNTER — Ambulatory Visit
Admission: RE | Admit: 2018-01-15 | Discharge: 2018-01-15 | Disposition: A | Discharge status: Home / Self Care | Payer: Medicaid Other | Source: Ambulatory Visit | Attending: Gastroenterology | Admitting: Gastroenterology

## 2018-01-15 DIAGNOSIS — K760 Fatty (change of) liver, not elsewhere classified: Secondary | ICD-10-CM | POA: Diagnosis present

## 2018-01-15 NOTE — Telephone Encounter (Signed)
Give her information about bariatric surgery classes at [facility]

## 2018-01-15 NOTE — Telephone Encounter (Signed)
Patient states the Dietian states it is covered but when she called Medicaid they stated it is not covered. So the patient is unable to pay out of pocket for it and did not know what to do because she is interested in [facility].

## 2018-01-15 NOTE — Telephone Encounter (Signed)
Send her some diet information for DM

## 2018-01-18 ENCOUNTER — Other Ambulatory Visit: Payer: Medicaid Other

## 2018-01-18 NOTE — Telephone Encounter (Signed)
Informed patient to call [facility] to call inquire on detailed information:  [phone]

## 2018-01-25 ENCOUNTER — Ambulatory Visit: Payer: Medicaid Other | Admitting: Skilled Nursing Facility1

## 2018-02-02 ENCOUNTER — Telehealth: Payer: Self-pay | Admitting: Gastroenterology

## 2018-02-02 NOTE — Telephone Encounter (Signed)
PT  Left vm she states she been taking her  rx Amoxicillin and rx  clomisasine for 2 weeks now and she is still having burning pain on anus and sharp abdominal pain she would like a call regarding her pain

## 2018-02-03 NOTE — Telephone Encounter (Signed)
I spoke with pt and she will contact us if pain continues of which her rectum hurts, questionable hemorrhoids and pt is using cream. She is aware that we may need to do EGD/colonoscopy.

## 2018-02-15 ENCOUNTER — Telehealth: Payer: Self-pay | Admitting: Gastroenterology

## 2018-02-15 NOTE — Telephone Encounter (Signed)
Pt was told to call Eunice Blase and let her know after she finished the rx antibiotic to call her she is still having pain please call pt cb [phone]

## 2018-02-19 NOTE — Telephone Encounter (Signed)
Left message that I would send Dr. Michele Mcalpine message to her My Chart.

## 2018-02-24 ENCOUNTER — Ambulatory Visit: Payer: Medicaid Other | Admitting: Family Medicine

## 2018-02-24 ENCOUNTER — Encounter: Payer: Self-pay | Admitting: Family Medicine

## 2018-02-24 DIAGNOSIS — M797 Fibromyalgia: Secondary | ICD-10-CM | POA: Diagnosis not present

## 2018-02-24 DIAGNOSIS — K760 Fatty (change of) liver, not elsewhere classified: Secondary | ICD-10-CM | POA: Diagnosis not present

## 2018-02-24 DIAGNOSIS — R7303 Prediabetes: Secondary | ICD-10-CM | POA: Diagnosis not present

## 2018-02-24 DIAGNOSIS — K5909 Other constipation: Secondary | ICD-10-CM | POA: Diagnosis not present

## 2018-02-24 DIAGNOSIS — F319 Bipolar disorder, unspecified: Secondary | ICD-10-CM | POA: Diagnosis not present

## 2018-02-24 DIAGNOSIS — Z114 Encounter for screening for human immunodeficiency virus [HIV]: Secondary | ICD-10-CM

## 2018-02-24 MED ORDER — LAMOTRIGINE 100 MG PO TABS: 200.0000 mg | ORAL_TABLET | Freq: Every day | ORAL | 3 refills | Status: DC
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
## 2018-02-25 LAB — COMPLETE METABOLIC PANEL WITH GFR
AG Ratio: 1.7 (calc) (ref 1.0–2.5)
ALT: 24 U/L (ref 6–29)
AST: 21 U/L (ref 10–30)
Albumin: 4.2 g/dL (ref 3.6–5.1)
Alkaline phosphatase (APISO): 49 U/L (ref 33–115)
BUN: 13 mg/dL (ref 7–25)
CO2: 24 mmol/L (ref 20–32)
Calcium: 9.4 mg/dL (ref 8.6–10.2)
Chloride: 101 mmol/L (ref 98–110)
Creat: 0.91 mg/dL (ref 0.50–1.10)
GFR, Est African American: 102 mL/min/{1.73_m2} (ref >=60)
GFR, Est Non African American: 88 mL/min/{1.73_m2} (ref >=60)
Globulin: 2.5 g/dL (calc) (ref 1.9–3.7)
Glucose, Bld: 84 mg/dL (ref 65–139)
Potassium: 3.8 mmol/L (ref 3.5–5.3)
Sodium: 137 mmol/L (ref 135–146)
Total Bilirubin: 0.4 mg/dL (ref 0.2–1.2)
Total Protein: 6.7 g/dL (ref 6.1–8.1)

## 2018-02-25 LAB — LIPID PANEL
Cholesterol: 90 mg/dL (ref <200)
HDL: 51 mg/dL (ref >50)
LDL Cholesterol (Calc): 30 mg/dL (calc)
Non-HDL Cholesterol (Calc): 39 mg/dL (calc) (ref <130)
Total CHOL/HDL Ratio: 1.8 (calc) (ref <5.0)
Triglycerides: 30 mg/dL (ref <150)

## 2018-02-25 LAB — CBC WITH DIFFERENTIAL/PLATELET
Basophils Absolute: 42 cells/uL (ref 0–200)
Basophils Relative: 0.8 %
Eosinophils Absolute: 207 cells/uL (ref 15–500)
Eosinophils Relative: 3.9 %
HCT: 37.7 % (ref 35.0–45.0)
Hemoglobin: 12.4 g/dL (ref 11.7–15.5)
Lymphs Abs: 1829 cells/uL (ref 850–3900)
MCH: 27.3 pg (ref 27.0–33.0)
MCHC: 32.9 g/dL (ref 32.0–36.0)
MCV: 83 fL (ref 80.0–100.0)
MPV: 11.2 fL (ref 7.5–12.5)
Monocytes Relative: 14.3 %
Neutro Abs: 2465 cells/uL (ref 1500–7800)
Neutrophils Relative %: 46.5 %
Platelets: 241 10*3/uL (ref 140–400)
RBC: 4.54 10*6/uL (ref 3.80–5.10)
RDW: 14.3 % (ref 11.0–15.0)
Total Lymphocyte: 34.5 %
WBC mixed population: 758 cells/uL (ref 200–950)
WBC: 5.3 10*3/uL (ref 3.8–10.8)

## 2018-02-25 LAB — HEMOGLOBIN A1C
Hgb A1c MFr Bld: 5.5 % of total Hgb (ref <5.7)
Mean Plasma Glucose: 111 (calc)
eAG (mmol/L): 6.2 (calc)

## 2018-02-25 LAB — HIV ANTIBODY (ROUTINE TESTING W REFLEX): HIV 1&2 Ab, 4th Generation: NONREACTIVE

## 2018-02-26 ENCOUNTER — Encounter (HOSPITAL_COMMUNITY): Payer: Self-pay | Admitting: *Deleted

## 2018-02-26 ENCOUNTER — Other Ambulatory Visit: Payer: Self-pay

## 2018-02-26 ENCOUNTER — Emergency Department (HOSPITAL_COMMUNITY)
Admission: EM | Admit: 2018-02-26 | Discharge: 2018-02-27 | Disposition: A | Discharge status: Other Acute Inpatient Hospital | Payer: Medicaid Other | Attending: Emergency Medicine | Admitting: Emergency Medicine

## 2018-02-26 DIAGNOSIS — F3113 Bipolar disorder, current episode manic without psychotic features, severe: Secondary | ICD-10-CM | POA: Insufficient documentation

## 2018-02-26 DIAGNOSIS — Z79899 Other long term (current) drug therapy: Secondary | ICD-10-CM | POA: Diagnosis not present

## 2018-02-26 DIAGNOSIS — F3112 Bipolar disorder, current episode manic without psychotic features, moderate: Secondary | ICD-10-CM

## 2018-02-26 DIAGNOSIS — Z87891 Personal history of nicotine dependence: Secondary | ICD-10-CM | POA: Diagnosis not present

## 2018-02-26 DIAGNOSIS — F329 Major depressive disorder, single episode, unspecified: Secondary | ICD-10-CM | POA: Diagnosis present
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
## 2018-02-27 ENCOUNTER — Inpatient Hospital Stay (HOSPITAL_COMMUNITY)
Admission: AD | Admit: 2018-02-27 | Discharge: 2018-03-03 | DRG: 885 | Disposition: A | Discharge status: Home / Self Care | Payer: Medicaid Other | Source: Intra-hospital | Attending: Psychiatry | Admitting: Psychiatry

## 2018-02-27 ENCOUNTER — Encounter (HOSPITAL_COMMUNITY): Payer: Self-pay

## 2018-02-27 ENCOUNTER — Other Ambulatory Visit: Payer: Self-pay

## 2018-02-27 DIAGNOSIS — F909 Attention-deficit hyperactivity disorder, unspecified type: Secondary | ICD-10-CM | POA: Diagnosis present

## 2018-02-27 DIAGNOSIS — R45851 Suicidal ideations: Secondary | ICD-10-CM | POA: Diagnosis present

## 2018-02-27 DIAGNOSIS — F312 Bipolar disorder, current episode manic severe with psychotic features: Secondary | ICD-10-CM

## 2018-02-27 DIAGNOSIS — G4700 Insomnia, unspecified: Secondary | ICD-10-CM | POA: Diagnosis present

## 2018-02-27 DIAGNOSIS — F3160 Bipolar disorder, current episode mixed, unspecified: Secondary | ICD-10-CM | POA: Diagnosis present

## 2018-02-27 DIAGNOSIS — F1290 Cannabis use, unspecified, uncomplicated: Secondary | ICD-10-CM | POA: Diagnosis not present

## 2018-02-27 DIAGNOSIS — F4312 Post-traumatic stress disorder, chronic: Secondary | ICD-10-CM

## 2018-02-27 DIAGNOSIS — F1220 Cannabis dependence, uncomplicated: Secondary | ICD-10-CM | POA: Diagnosis not present

## 2018-02-27 DIAGNOSIS — F1590 Other stimulant use, unspecified, uncomplicated: Secondary | ICD-10-CM | POA: Diagnosis present

## 2018-02-27 DIAGNOSIS — Z638 Other specified problems related to primary support group: Secondary | ICD-10-CM | POA: Diagnosis not present

## 2018-02-27 DIAGNOSIS — F4310 Post-traumatic stress disorder, unspecified: Secondary | ICD-10-CM | POA: Diagnosis present

## 2018-02-27 DIAGNOSIS — Z888 Allergy status to other drugs, medicaments and biological substances status: Secondary | ICD-10-CM | POA: Diagnosis not present

## 2018-02-27 DIAGNOSIS — R45850 Homicidal ideations: Secondary | ICD-10-CM | POA: Diagnosis present

## 2018-02-27 DIAGNOSIS — Z9049 Acquired absence of other specified parts of digestive tract: Secondary | ICD-10-CM

## 2018-02-27 DIAGNOSIS — Z8659 Personal history of other mental and behavioral disorders: Secondary | ICD-10-CM

## 2018-02-27 DIAGNOSIS — F3113 Bipolar disorder, current episode manic without psychotic features, severe: Secondary | ICD-10-CM | POA: Diagnosis not present

## 2018-02-27 DIAGNOSIS — Z87891 Personal history of nicotine dependence: Secondary | ICD-10-CM | POA: Diagnosis not present

## 2018-02-27 DIAGNOSIS — F419 Anxiety disorder, unspecified: Secondary | ICD-10-CM | POA: Diagnosis present

## 2018-02-27 DIAGNOSIS — Z915 Personal history of self-harm: Secondary | ICD-10-CM

## 2018-02-27 DIAGNOSIS — Z79899 Other long term (current) drug therapy: Secondary | ICD-10-CM | POA: Diagnosis not present

## 2018-02-27 DIAGNOSIS — Z818 Family history of other mental and behavioral disorders: Secondary | ICD-10-CM

## 2018-02-27 DIAGNOSIS — F319 Bipolar disorder, unspecified: Secondary | ICD-10-CM | POA: Diagnosis present

## 2018-02-27 DIAGNOSIS — F310 Bipolar disorder, current episode hypomanic: Secondary | ICD-10-CM | POA: Diagnosis not present

## 2018-02-27 LAB — CBC WITH DIFFERENTIAL/PLATELET
Basophils Absolute: 0 10*3/uL (ref 0.0–0.1)
Basophils Relative: 1 %
Eosinophils Absolute: 0.4 10*3/uL (ref 0.0–0.7)
Eosinophils Relative: 6 %
HCT: 37.3 % (ref 36.0–46.0)
Hemoglobin: 12.1 g/dL (ref 12.0–15.0)
Lymphocytes Relative: 48 %
Lymphs Abs: 3.1 10*3/uL (ref 0.7–4.0)
MCH: 27.8 pg (ref 26.0–34.0)
MCHC: 32.4 g/dL (ref 30.0–36.0)
MCV: 85.6 fL (ref 78.0–100.0)
Monocytes Absolute: 0.5 10*3/uL (ref 0.1–1.0)
Monocytes Relative: 8 %
Neutro Abs: 2.4 10*3/uL (ref 1.7–7.7)
Neutrophils Relative %: 37 %
Platelets: 257 10*3/uL (ref 150–400)
RBC: 4.36 MIL/uL (ref 3.87–5.11)
RDW: 15.6 % — ABNORMAL HIGH (ref 11.5–15.5)
WBC: 6.4 10*3/uL (ref 4.0–10.5)

## 2018-02-27 LAB — COMPREHENSIVE METABOLIC PANEL
ALT: 29 U/L (ref 0–44)
AST: 20 U/L (ref 15–41)
Albumin: 3.9 g/dL (ref 3.5–5.0)
Alkaline Phosphatase: 45 U/L (ref 38–126)
Anion gap: 8 (ref 5–15)
BUN: 13 mg/dL (ref 6–20)
CO2: 25 mmol/L (ref 22–32)
Calcium: 9.1 mg/dL (ref 8.9–10.3)
Chloride: 102 mmol/L (ref 98–111)
Creatinine, Ser: 0.95 mg/dL (ref 0.44–1.00)
GFR calc Af Amer: >60 mL/min (ref >60)
GFR calc non Af Amer: >60 mL/min (ref >60)
Glucose, Bld: 95 mg/dL (ref 70–99)
Potassium: 3.3 mmol/L — ABNORMAL LOW (ref 3.5–5.1)
Sodium: 135 mmol/L (ref 135–145)
Total Bilirubin: 0.6 mg/dL (ref 0.3–1.2)
Total Protein: 7.2 g/dL (ref 6.5–8.1)

## 2018-02-27 LAB — RAPID URINE DRUG SCREEN, HOSP PERFORMED
Amphetamines: POSITIVE — AB
Benzodiazepines: NOT DETECTED
Cocaine: NOT DETECTED
Opiates: NOT DETECTED
Tetrahydrocannabinol: POSITIVE — AB

## 2018-02-27 LAB — ETHANOL: Alcohol, Ethyl (B): <10 mg/dL (ref <10)

## 2018-02-27 LAB — I-STAT BETA HCG BLOOD, ED (MC, WL, AP ONLY): I-stat hCG, quantitative: <5 m[IU]/mL (ref <5)

## 2018-02-27 MED ORDER — HYDROXYZINE HCL 25 MG PO TABS
25.0000 mg | ORAL_TABLET | Freq: Every evening | ORAL | Status: DC | PRN
  Administered 2018-02-27: 50 mg via ORAL
  Filled 2018-02-27 (×2): qty 1

## 2018-02-27 MED ORDER — DIPHENHYDRAMINE HCL 25 MG PO CAPS: 25.0000 mg | ORAL_CAPSULE | Freq: Four times a day (QID) | ORAL | Status: DC | PRN

## 2018-02-27 MED ORDER — IBUPROFEN 400 MG PO TABS
600.0000 mg | ORAL_TABLET | Freq: Three times a day (TID) | ORAL | Status: DC | PRN
Start: 2018-02-27 — End: 2018-02-27
  Filled 2018-02-27: qty 2

## 2018-02-27 MED ORDER — LORAZEPAM 1 MG PO TABS: 1.0000 mg | ORAL_TABLET | Freq: Four times a day (QID) | ORAL | Status: DC | PRN

## 2018-02-27 MED ORDER — ACETAMINOPHEN 325 MG PO TABS
650.0000 mg | ORAL_TABLET | Freq: Four times a day (QID) | ORAL | Status: DC | PRN
  Administered 2018-02-27: 650 mg via ORAL
  Filled 2018-02-27: qty 2

## 2018-02-27 MED ORDER — HALOPERIDOL 5 MG PO TABS: 5.0000 mg | ORAL_TABLET | Freq: Four times a day (QID) | ORAL | Status: DC | PRN

## 2018-02-27 MED ORDER — HYDROXYZINE HCL 25 MG PO TABS
25.0000 mg | ORAL_TABLET | Freq: Three times a day (TID) | ORAL | Status: DC | PRN
  Administered 2018-02-27 – 2018-02-28 (×2): 25 mg via ORAL
  Filled 2018-02-27 (×2): qty 1

## 2018-02-27 MED ORDER — HYDROXYZINE HCL 25 MG PO TABS: 25.0000 mg | ORAL_TABLET | Freq: Three times a day (TID) | ORAL | Status: DC | PRN

## 2018-02-27 MED ORDER — RISPERIDONE 1 MG PO TABS: 3.0000 mg | ORAL_TABLET | Freq: Every day | ORAL | Status: DC

## 2018-02-27 MED ORDER — LAMOTRIGINE 25 MG PO TABS
200.0000 mg | ORAL_TABLET | Freq: Every day | ORAL | Status: DC
  Administered 2018-02-27: 200 mg via ORAL
  Filled 2018-02-27: qty 8

## 2018-02-27 MED ORDER — LORAZEPAM 1 MG PO TABS
1.0000 mg | ORAL_TABLET | Freq: Once | ORAL | Status: AC
  Administered 2018-02-27: 1 mg via ORAL
  Filled 2018-02-27: qty 1

## 2018-02-27 MED ORDER — ALUM & MAG HYDROXIDE-SIMETH 200-200-20 MG/5ML PO SUSP: 30.0000 mL | ORAL | Status: DC | PRN

## 2018-02-27 MED ORDER — RISPERIDONE 3 MG PO TABS
3.0000 mg | ORAL_TABLET | Freq: Every day | ORAL | Status: DC
  Administered 2018-02-27 – 2018-03-02 (×4): 3 mg via ORAL
  Filled 2018-02-27: qty 1
  Filled 2018-02-27: qty 3
  Filled 2018-02-27 (×4): qty 1

## 2018-02-27 MED ORDER — PANTOPRAZOLE SODIUM 40 MG PO TBEC
40.0000 mg | DELAYED_RELEASE_TABLET | Freq: Every day | ORAL | Status: DC
  Administered 2018-03-01 – 2018-03-03 (×3): 40 mg via ORAL
  Filled 2018-02-27 (×8): qty 1

## 2018-02-27 MED ORDER — AMPHETAMINE-DEXTROAMPHET ER 5 MG PO CP24
10.0000 mg | ORAL_CAPSULE | Freq: Every day | ORAL | Status: DC
  Filled 2018-02-27 (×3): qty 2

## 2018-02-27 MED ORDER — MAGNESIUM HYDROXIDE 400 MG/5ML PO SUSP: 30.0000 mL | Freq: Every day | ORAL | Status: DC | PRN

## 2018-02-27 MED ORDER — ACETAMINOPHEN 325 MG PO TABS
ORAL_TABLET | ORAL | Status: AC
  Administered 2018-02-27: 650 mg via ORAL
  Filled 2018-02-27: qty 2

## 2018-02-27 MED ORDER — ONDANSETRON HCL 4 MG PO TABS
4.0000 mg | ORAL_TABLET | Freq: Three times a day (TID) | ORAL | Status: DC | PRN
  Filled 2018-02-27: qty 1

## 2018-02-27 MED ORDER — TRAZODONE HCL 50 MG PO TABS
50.0000 mg | ORAL_TABLET | Freq: Every evening | ORAL | Status: DC | PRN
  Administered 2018-03-01 – 2018-03-02 (×2): 50 mg via ORAL
  Filled 2018-02-27 (×2): qty 1

## 2018-02-27 MED ORDER — HALOPERIDOL LACTATE 5 MG/ML IJ SOLN: 5.0000 mg | Freq: Four times a day (QID) | INTRAMUSCULAR | Status: DC | PRN

## 2018-02-27 MED ORDER — ATOMOXETINE HCL 60 MG PO CAPS
60.0000 mg | ORAL_CAPSULE | Freq: Every day | ORAL | Status: DC
  Filled 2018-02-27 (×3): qty 1

## 2018-02-27 MED ORDER — DIPHENHYDRAMINE HCL 50 MG/ML IJ SOLN: 25.0000 mg | Freq: Four times a day (QID) | INTRAMUSCULAR | Status: DC | PRN

## 2018-02-27 MED ORDER — PANTOPRAZOLE SODIUM 40 MG PO TBEC
40.0000 mg | DELAYED_RELEASE_TABLET | Freq: Every day | ORAL | Status: DC
  Filled 2018-02-27: qty 1

## 2018-02-27 MED ORDER — LORAZEPAM 2 MG/ML IJ SOLN: 1.0000 mg | Freq: Four times a day (QID) | INTRAMUSCULAR | Status: DC | PRN

## 2018-02-27 MED ORDER — LAMOTRIGINE 200 MG PO TABS
200.0000 mg | ORAL_TABLET | Freq: Every day | ORAL | Status: DC
  Administered 2018-02-28 – 2018-03-03 (×4): 200 mg via ORAL
  Filled 2018-02-27 (×6): qty 1

## 2018-02-27 MED ORDER — ACETAMINOPHEN 325 MG PO TABS
650.0000 mg | ORAL_TABLET | Freq: Once | ORAL | Status: AC
  Administered 2018-02-27: 650 mg via ORAL
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
## 2018-02-27 NOTE — Progress Notes (Signed)
Did not attend group 
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
## 2018-02-27 NOTE — Tx Team (Signed)
Initial Treatment Plan 02/27/2018 3:18 PM Montserrat Shek WUJ:[id number]    PATIENT STRESSORS: Financial difficulties Marital or family conflict Occupational concerns Substance abuse   PATIENT STRENGTHS: Barrister's clerk for treatment/growth Supportive family/friends   PATIENT IDENTIFIED PROBLEMS: depression  anxiety  "learn coping skills"  "spirituality/work on anxiety."               DISCHARGE CRITERIA:  Ability to meet basic life and health needs Adequate post-discharge living arrangements Improved stabilization in mood, thinking, and/or behavior Medical problems require only outpatient monitoring  PRELIMINARY DISCHARGE PLAN: Attend aftercare/continuing care group Attend PHP/IOP Return to previous living arrangement  PATIENT/FAMILY INVOLVEMENT: This treatment plan has been presented to and reviewed with the patient, Raevin Wierenga, and/or family member.  The patient and family have been given the opportunity to ask questions and make suggestions.  Bethann Punches, RN 02/27/2018, 3:18 PM
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
## 2018-02-27 NOTE — BH Assessment (Addendum)
Reassessment:  02/27/2018 Patient presented orientated x4, mood "happy...doing good", affect flat with pressured speech and circumstantial thought process, denied current SI, HI, AVH, and paranoia.  Patient denied having current thoughts of harming Step Father, although she reports having them in the past.  Patient reports feeling "refreshed" from last night sleep, denied feeling manic, however indicated feeling "hyper."  Patient had to be redirected back to assessment questions several times.  Patient reports a plan to return home after hospital discharge to help Mother who had surgery recently.    Per Reola Calkins, NP; Patient continues to meet inpatient criteria and placement will be sought.  Dr. Charm Barges provided Patient's disposition.

## 2018-02-27 NOTE — ED Notes (Signed)
When asked if pt feels suicidal, she states, "I don't really feel suicidal, I just don't feel comfortable where I live". Pt denies plan of suicide.

## 2018-02-27 NOTE — ED Notes (Signed)
TTS in progress 
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
## 2018-02-27 NOTE — ED Provider Notes (Signed)
8:15 AM- IVY from [facility] called and states they are still recommending inpatient and she is on a bed search.   Terrilee Files, MD 02/28/18 [phone]

## 2018-02-27 NOTE — Progress Notes (Signed)
Patient has been accepted to [facility], bed 506-1. The accepting provider is Feliz Beam and the attending doctor is Dr. Viviano Simas. AC will call nurse when the bed is available.   Moss Mc MSW, LCSW, LCAS Clinical Social Worker 02/27/2018 9:43 AM

## 2018-02-27 NOTE — ED Notes (Signed)
[facility] recommended over nite observation and patient is to be re-evaluated in the am.

## 2018-02-27 NOTE — ED Provider Notes (Signed)
4:45 AM [facility], [facility], states they recommend inpatient admission, [facility] does not have a bed right now, but may later this morning. She is voluntarily.    Devoria Albe, MD 02/27/18 [phone]

## 2018-02-27 NOTE — ED Notes (Signed)
Patient given a sandwich and soda.

## 2018-02-28 LAB — TSH: TSH: 1.56 u[IU]/mL (ref 0.350–4.500)

## 2018-02-28 MED ORDER — PSYLLIUM 95 % PO PACK
1.0000 | PACK | Freq: Every day | ORAL | Status: DC
  Administered 2018-02-28 – 2018-03-02 (×3): 1 via ORAL
  Filled 2018-02-28 (×4): qty 1

## 2018-02-28 NOTE — [facility] Group Notes (Signed)
[facility] Group Notes:  (Nursing)  Date:  02/28/2018  Time:  5:58 PM  Type of Therapy:  Nurse Education  Participation Level:  Active  Participation Quality:  Appropriate and Attentive  Affect:  Appropriate  Cognitive:  Alert and Appropriate  Insight:  Appropriate and Good  Engagement in Group:  Engaged  Modes of Intervention:  Education and Exploration  Summary of Progress/Problems: Group discussed 'thought chaining' for problem solving- identified how behaviors, thoughts, and emotions are interrelated.   Shela Nevin 02/28/2018, 5:58 PM

## 2018-02-28 NOTE — [facility] Group Notes (Signed)
[facility] LCSW Group Therapy Note  Date/Time:  02/28/2018  11:00AM-12:00PM  Type of Therapy and Topic:  Group Therapy:  Music and Mood  Participation Level:  Active   Description of Group: In this process group, members listened to a variety of genres of music and identified that different types of music evoke different responses.  Patients were encouraged to identify music that was soothing for them and music that was energizing for them.  Patients discussed how this knowledge can help with wellness and recovery in various ways including managing depression and anxiety as well as encouraging healthy sleep habits.    Therapeutic Goals: 1. Patients will explore the impact of different varieties of music on mood 2. Patients will verbalize the thoughts they have when listening to different types of music 3. Patients will identify music that is soothing to them as well as music that is energizing to them 4. Patients will discuss how to use this knowledge to assist in maintaining wellness and recovery 5. Patients will explore the use of music as a coping skill  Summary of Patient Progress:  At the beginning of group, patient expressed that he felt "good" and she interacted a great deal during group, at times having to be redirected from talking over the music or being intrusive with other patients.  She said at the end of group she felt "amazing."  Therapeutic Modalities: Solution Focused Brief Therapy Activity   Ambrose Mantle, LCSW
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
## 2018-02-28 NOTE — Progress Notes (Signed)
[facility] MD Progress Note  02/28/2018 2:03 PM Courtney [name]  MRN:  [id number] Subjective: I am doing amazing.  Of being grounded for weeks, and I cannot do it anymore.  I am moving out now.  I smoked marijuana when I need to but not every second of the day.  I wanted to kill myself and my dead as soon as I got out of the house.  However once I was on the elements I immediately began to feel better because I knew I would have to come back there.  I can take care of myself.  Last time I praised for patients, however I got only jobs did help with my patients.  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  I do what I got to do, and will have him to pick me up.   Objective: 27 year old female with past psychiatric history, admitted to the hospital for suicidal homicidal ideations after increased stressors at home.  Patient presents with anxious affect, and mood is congruent.  She presents with pressured speech, flight of ideas, tangentiality, and increased volume.  She remains alert and oriented, calm and cooperative.  She is currently minimizing her depression, anxiety, and helplessness at this time rating both 0 out of 10 with 10 being the worst.  Patient is observed interacting well with peers and milieu, however continues to present with rapid speech.  There does not appear to be any obvious thought blocking, preoccupations, and or response to internal stimuli.  She was previously started on medication to include hydroxyzine, Lamictal, and risperidone which were her home medications.  She was also started on Adderall XR and Strattera which is being held during his hospital admission.  She is  denied any suicidal, homicidal, and or auditory visual hallucinations. Principal Problem: <principal problem not specified> Diagnosis:   Patient Active Problem List   Diagnosis Date Noted  . Bipolar disorder, unspecified (HCC) [F31.9] 02/27/2018  . Posttraumatic stress disorder  [F43.10]   . Cannabis dependence (HCC) [F12.20]   . PTSD (post-traumatic stress disorder) [F43.10] 12/31/2017  . Bipolar I disorder, current or most recent episode manic, with psychotic features (HCC) [F31.2] 12/31/2017  . Anxiety [F41.9] 12/31/2017  . OCD (obsessive compulsive disorder) [F42.9] 12/31/2017  . ADHD [F90.9] 12/31/2017  . Prediabetes [R73.03] 12/31/2017   Total Time spent with patient: 30 minutes  Past Psychiatric History: Patient admitted 2/20 hospitalizations while she lived in [place].  This is her first hospitalization at [facility].  She has been on multiple medications in the past.  [facility].  Past Medical History:  Past Medical History:  Diagnosis Date  . ADHD    As a child  . Anxiety   . Bipolar 1 disorder (HCC)   . OCD (obsessive compulsive disorder)   . PTSD (post-traumatic stress disorder)   . Pyloric stenosis     Past Surgical History:  Procedure Laterality Date  . APPENDECTOMY  02/17/2014   Family History:  Family History  Problem Relation Age of Onset  . COPD Mother   . Hypertension Mother   . Asthma Mother   . Arthritis Mother   . Pancreatic cancer Mother        slow growing  . ADD / ADHD Mother   . Other Mother        Spinal Stenosis - currently in surgery today  . Bipolar disorder Father   .  Arthritis Father   . Hypercholesterolemia Father   . Asthma Brother   . Hernia Brother   . ADD / ADHD Brother   . Hypertension Maternal Grandmother   . Heart disease Maternal Grandmother 37  . Rheumatic fever Maternal Grandmother   . Heart attack Maternal Grandmother        Bypass Surgery  . Pancreatic cancer Maternal Grandfather   . Liver cancer Maternal Grandfather   . Obesity Paternal Grandmother    Family Psychiatric  History: She stated she had a family history of bipolar disorder as well as depression.  She stated her mother had bipolar disorder.   Social History:  Social  History   Substance and Sexual Activity  Alcohol Use Yes   Comment: occasionally drink a beer     Social History   Substance and Sexual Activity  Drug Use Yes  . Types: Marijuana   Comment: cocaine in the past but not currently     Social History   Socioeconomic History  . Marital status: Single    Spouse name: Not on file  . Number of children: 0  . Years of education: Not on file  . Highest education level: Not on file  Occupational History  . Occupation: disability     Comment: mental health   Social Needs  . Financial resource strain: Somewhat hard  . Food insecurity:    Worry: Never true    Inability: Never true  . Transportation needs:    Medical: No    Non-medical: No  Tobacco Use  . Smoking status: Former Smoker    Packs/day: 0.25    Years: 10.00    Pack years: 2.50    Types: Cigarettes    Start date: 01/01/2008  . Smokeless tobacco: Never Used  Substance and Sexual Activity  . Alcohol use: Yes    Comment: occasionally drink a beer  . Drug use: Yes    Types: Marijuana    Comment: cocaine in the past but not currently   . Sexual activity: Yes    Partners: Male    Birth control/protection: Condom  Lifestyle  . Physical activity:    Days per week: 5 days    Minutes per session: 60 min  . Stress: Rather much  Relationships  . Social connections:    Talks on phone: More than three times a week    Gets together: Three times a week    Attends religious service: More than 4 times per year    Active member of club or organization: Yes    Attends meetings of clubs or organizations: More than 4 times per year    Relationship status: Never married  Other Topics Concern  . Not on file  Social History Narrative   Moved to [place] from [facility] to be closer to family. Parents moved here in 2018.   On disability for bipolar disorder since young age, had to be placed in a group home and foster home because of behavior. She is now living with parents since she decided to  take her medications.    Additional Social History:    Pain Medications: See MAR Prescriptions: See MAR Over the Counter: See MAR History of alcohol / drug use?: Yes Longest period of sobriety (when/how long): Unknown Negative Consequences of Use: Personal relationships Withdrawal Symptoms: Other (Comment)(none) Name of Substance 1: Marijuana 1 - Age of First Use: Adolescent 1 - Amount (size/oz): "1 hit" 1 - Frequency: Daily 1 - Duration: Ongoing 1 -  Last Use / Amount: 02/26/18                  Sleep: Fair  Appetite:  Fair  Current Medications: Current Facility-Administered Medications  Medication Dose Route Frequency Provider Last Rate Last Dose  . acetaminophen (TYLENOL) tablet 650 mg  650 mg Oral Q6H PRN Money, Gerlene Burdock, FNP   650 mg at 02/27/18 2143  . alum & mag hydroxide-simeth (MAALOX/MYLANTA) 200-200-20 MG/5ML suspension 30 mL  30 mL Oral Q4H PRN Money, Feliz Beam B, FNP      . diphenhydrAMINE (BENADRYL) capsule 25 mg  25 mg Oral Q6H PRN Money, Gerlene Burdock, FNP       Or  . diphenhydrAMINE (BENADRYL) injection 25 mg  25 mg Intramuscular Q6H PRN Money, Feliz Beam B, FNP      . haloperidol (HALDOL) tablet 5 mg  5 mg Oral Q6H PRN Money, Feliz Beam B, FNP       Or  . haloperidol lactate (HALDOL) injection 5 mg  5 mg Intramuscular Q6H PRN Money, Gerlene Burdock, FNP      . hydrOXYzine (ATARAX/VISTARIL) tablet 25 mg  25 mg Oral TID PRN Mariel Craft, MD   25 mg at 02/27/18 2141  . lamoTRIgine (LAMICTAL) tablet 200 mg  200 mg Oral Daily Money, Gerlene Burdock, FNP   200 mg at 02/28/18 0826  . LORazepam (ATIVAN) tablet 1 mg  1 mg Oral Q6H PRN Money, Gerlene Burdock, FNP       Or  . LORazepam (ATIVAN) injection 1 mg  1 mg Intramuscular Q6H PRN Money, Feliz Beam B, FNP      . magnesium hydroxide (MILK OF MAGNESIA) suspension 30 mL  30 mL Oral Daily PRN Money, Gerlene Burdock, FNP      . pantoprazole (PROTONIX) EC tablet 40 mg  40 mg Oral Daily Money, Gerlene Burdock, FNP      . psyllium (HYDROCIL/METAMUCIL) packet 1  packet  1 packet Oral QHS Mariel Craft, MD      . risperiDONE (RISPERDAL) tablet 3 mg  3 mg Oral QHS Money, Gerlene Burdock, FNP   3 mg at 02/27/18 2139  . traZODone (DESYREL) tablet 50 mg  50 mg Oral QHS PRN Money, Gerlene Burdock, FNP        Lab Results:  Results for orders placed or performed during the hospital encounter of 02/27/18 (from the past 48 hour(s))  TSH     Status: None   Collection Time: 02/28/18  6:33 AM  Result Value Ref Range   TSH 1.560 0.350 - 4.500 uIU/mL    Comment: Performed by a 3rd Generation assay with a functional sensitivity of <=0.01 uIU/mL. Performed at [facility], [address] [place], [place] [address]     Blood Alcohol level:  Lab Results  Component Value Date   ETH <10 02/27/2018    Metabolic Disorder Labs: Lab Results  Component Value Date   HGBA1C 5.5 02/24/2018   MPG 111 02/24/2018   No results found for: PROLACTIN Lab Results  Component Value Date   CHOL 90 02/24/2018   TRIG 30 02/24/2018   HDL 51 02/24/2018   CHOLHDL 1.8 02/24/2018   LDLCALC 30 02/24/2018   LDLCALC 32 10/16/2017    Physical Findings: AIMS: Facial and Oral Movements Muscles of Facial Expression: None, normal Lips and Perioral Area: None, normal Jaw: None, normal Tongue: None, normal,Extremity Movements Upper (arms, wrists, hands, fingers): None, normal Lower (legs, knees, ankles, toes): None, normal, Trunk Movements Neck, shoulders,  hips: None, normal, Overall Severity Severity of abnormal movements (highest score from questions above): None, normal Incapacitation due to abnormal movements: None, normal Patient's awareness of abnormal movements (rate only patient's report): No Awareness, Dental Status Current problems with teeth and/or dentures?: No Does patient usually wear dentures?: No  CIWA:  CIWA-Ar Total: 0 COWS:  COWS Total Score: 1  Musculoskeletal: Strength & Muscle Tone: within normal limits Gait & Station: normal Patient leans:  N/A  Psychiatric Specialty Exam: Physical Exam  ROS  Blood pressure 114/78, pulse 80, temperature 98.7 F (37.1 C), temperature source Oral, resp. rate 20, height 4' 11.92" (1.522 m), weight 85.3 kg (188 lb), last menstrual period 02/22/2018, SpO2 99 %.Body mass index is 36.81 kg/m.  General Appearance: Fairly Groomed  Eye Contact:  Good  Speech:  Clear and Coherent and Pressured  Volume:  Increased  Mood:  Euphoric  Affect:  Appropriate and Congruent  Thought Process:  Linear and Descriptions of Associations: Intact  Orientation:  Full (Time, Place, and Person)  Thought Content:  Logical  Suicidal Thoughts:  No  Homicidal Thoughts:  No  Memory:  Immediate;   Fair Recent;   Fair  Judgement:  Intact  Insight:  Present  Psychomotor Activity:  Normal and Increased  Concentration:  Concentration: Fair and Attention Span: Fair  Recall:  [employer] of Knowledge:  Fair  Language:  Fair  Akathisia:  No  Handed:  Right  AIMS (if indicated):     Assets:  Communication Skills Desire for Improvement Financial Resources/Insurance Leisure Time Physical Health Social Support Vocational/Educational  ADL's:  Intact  Cognition:  WNL  Sleep:  Number of Hours: 6.75     Treatment Plan Summary: Daily contact with patient to assess and evaluate symptoms and progress in treatment and Medication management  Treatment Plan/Recommendations:  1 Admit for crisis management and stabilization. Estimated length of stay 5-7 days past his current stay of 1.  2 Individual and group therapy. 3 Medication management for depression, and anxiety to reduce current symptoms to base line and improve the overall levels of functioning: Medications reviewed with the patient and she stated no untoward effects, home medications in place.  Continue the lamical 200 mg p.o. daily, risperidone 3 mg p.o. nightly, and trazodone 50 mg p.o. nightly as needed for insomnia.  Patient also has severe agitation protocol  ordered to include, Haldol, and Ativan for severe anxiety, psychosis, and moderate agitation. 4 Coping skills for depression and anxiety developing.  5 Continue crisis stabilization and management.  6 Address health issues- monitor vital signs, stable; , orders placed.  7 Treatment plan in progress to prevent relapse prevention and self care.  8 Psychosocial education regarding relapse prevention and self care 9 Heath care follow up as needed for any health concerns 10 Call for consult with hospitalist for additional specialty patient services as needed.    Truman Hayward, FNP 02/28/2018, 2:03 PM

## 2018-02-28 NOTE — [facility] Group Notes (Signed)
Patient did not attend orientation and goals group.  

## 2018-02-28 NOTE — Progress Notes (Signed)
D. Pt presents with an anxious affect congruent with mood- very talkative but friendly during interactions- calm, cooperative behavior. Per pt's self inventory, pt rates her depression, hopelessness and anxiety a 0/0/1, respectively. Pt observed interacting well with peers in milieu- Pt writes that her most important goal today to work on is "getting home" and writes that she will do "everything that is expected of me" to meet that goal. Pt currently denies SI/HI and AV hallucinations A. Labs and vitals monitored. Pt compliant with medications. Pt supported emotionally and encouraged to express concerns and ask questions.   R. Pt remains safe with 15 minute checks. Will continue POC.

## 2018-03-01 DIAGNOSIS — Z87891 Personal history of nicotine dependence: Secondary | ICD-10-CM

## 2018-03-01 DIAGNOSIS — F310 Bipolar disorder, current episode hypomanic: Secondary | ICD-10-CM

## 2018-03-01 NOTE — Progress Notes (Signed)
Patient denies SI, HI and AVH this shift.  Patient has been compliant with medications, attended groups and engaged in unit activities.  Patient has had no incidents of behavioral dsycontrol.   Assess patient for safety, offer medications as prescribed, engage patient in 1:1 staff talks.   Patient able to contract for safety. Continue to monitor as planned.  

## 2018-03-01 NOTE — Progress Notes (Signed)
[facility] MD Progress Note  03/01/2018 5:57 PM Courtney [name]  MRN:  [id number]     Courtney [name] is seen, chart reviewed. The chart findings discussed with the treatment team. Courtney [name] is seen with treatment team.  She discusses being raised in foster care for "behavioral problems (arrested at age 27 years) and having PTSD as a result from "things I saw".  She states a history of self harm as a teen, and cut again 2 weeks ago.  She has a hx of overdose in 2013 after finding out she was homeless. She describes her last SI to overdose with alcohol was 2017 prior to this admission.  Patient requests medication review and states that she was on Straterra and Adderall from her outpatient provider. She smokes marijuana "when I need to".  She denies SI, HI AVH today.  She states that she was wanting to hurt her step dad when she was grounded, but did not intend to hurt him. Reports a good appetite and states she is resting well. Courtney [name] denies any symptoms of depression, delusional thoughts or paranoia, and does not appear to be responding to any internal stimuli. Patient is visible on the milieu.  Patient seen attending group sessions with active and engaged participation. Courtney [name] has agreed to continue the current plan of care already in progress. She denies any other issues or concerns. Support encouragement reassurance was provided.   HPI:  27 year old female with past psychiatric history, admitted to the hospital for suicidal homicidal ideations after increased stressors at home.  Patient presents with anxious affect, and mood is congruent.  She presents with pressured speech, flight of ideas, tangentiality, and increased volume.  She remains alert and oriented, calm and cooperative.  She is currently minimizing her depression, anxiety, and helplessness at this time rating both 0 out of 10 with 10 being the worst.  Patient is observed interacting well with peers and milieu, however continues  to present with rapid speech.  There does not appear to be any obvious thought blocking, preoccupations, and or response to internal stimuli.  She was previously started on medication to include hydroxyzine, Lamictal, and risperidone which were her home medications.  She was also started on Adderall XR and Strattera which is being held during his hospital admission.  She is  denied any suicidal, homicidal, and or auditory visual hallucinations.   Principal Problem: Bipolar disorder, unspecified (HCC) Diagnosis:   Patient Active Problem List   Diagnosis Date Noted  . Bipolar disorder, unspecified (HCC) [F31.9] 02/27/2018  . Posttraumatic stress disorder [F43.10]   . Cannabis dependence (HCC) [F12.20]   . PTSD (post-traumatic stress disorder) [F43.10] 12/31/2017  . Bipolar I disorder, current or most recent episode manic, with psychotic features (HCC) [F31.2] 12/31/2017  . Anxiety [F41.9] 12/31/2017  . OCD (obsessive compulsive disorder) [F42.9] 12/31/2017  . ADHD [F90.9] 12/31/2017  . Prediabetes [R73.03] 12/31/2017   Total Time spent with patient: 35 min  Past Psychiatric History: Patient admitted 2/20 hospitalizations while she lived in [place].  This is her first hospitalization at [facility]  She has been on multiple medications in the past.  [facility].  Past Medical History:  Past Medical History:  Diagnosis Date  . ADHD    As a child  . Anxiety   . Bipolar 1 disorder (HCC)   . OCD (obsessive compulsive disorder)   . PTSD (post-traumatic stress disorder)   . Pyloric stenosis  Past Surgical History:  Procedure Laterality Date  . APPENDECTOMY  02/17/2014   Family History:  Family History  Problem Relation Age of Onset  . COPD Mother   . Hypertension Mother   . Asthma Mother   . Arthritis Mother   . Pancreatic cancer Mother        slow growing  . ADD / ADHD Mother   . Other Mother        Spinal Stenosis  - currently in surgery today  . Bipolar disorder Father   . Arthritis Father   . Hypercholesterolemia Father   . Asthma Brother   . Hernia Brother   . ADD / ADHD Brother   . Hypertension Maternal Grandmother   . Heart disease Maternal Grandmother 24  . Rheumatic fever Maternal Grandmother   . Heart attack Maternal Grandmother        Bypass Surgery  . Pancreatic cancer Maternal Grandfather   . Liver cancer Maternal Grandfather   . Obesity Paternal Grandmother    Family Psychiatric  History: She stated she had a family history of bipolar disorder as well as depression.  She stated her mother had bipolar disorder.   Social History:  Social History   Substance and Sexual Activity  Alcohol Use Yes   Comment: occasionally drink a beer     Social History   Substance and Sexual Activity  Drug Use Yes  . Types: Marijuana   Comment: cocaine in the past but not currently     Social History   Socioeconomic History  . Marital status: Single    Spouse name: Not on file  . Number of children: 0  . Years of education: Not on file  . Highest education level: Not on file  Occupational History  . Occupation: disability     Comment: mental health   Social Needs  . Financial resource strain: Somewhat hard  . Food insecurity:    Worry: Never true    Inability: Never true  . Transportation needs:    Medical: No    Non-medical: No  Tobacco Use  . Smoking status: Former Smoker    Packs/day: 0.25    Years: 10.00    Pack years: 2.50    Types: Cigarettes    Start date: 01/01/2008  . Smokeless tobacco: Never Used  Substance and Sexual Activity  . Alcohol use: Yes    Comment: occasionally drink a beer  . Drug use: Yes    Types: Marijuana    Comment: cocaine in the past but not currently   . Sexual activity: Yes    Partners: Male    Birth control/protection: Condom  Lifestyle  . Physical activity:    Days per week: 5 days    Minutes per session: 60 min  . Stress: Rather much   Relationships  . Social connections:    Talks on phone: More than three times a week    Gets together: Three times a week    Attends religious service: More than 4 times per year    Active member of club or organization: Yes    Attends meetings of clubs or organizations: More than 4 times per year    Relationship status: Never married  Other Topics Concern  . Not on file  Social History Narrative   Moved to [place] from [facility] to be closer to family. Parents moved here in 2018.   On disability for bipolar disorder since young age, had to be placed in a group home  and foster home because of behavior. She is now living with parents since she decided to take her medications.    Additional Social History:    Pain Medications: See MAR Prescriptions: See MAR Over the Counter: See MAR History of alcohol / drug use?: Yes Longest period of sobriety (when/how long): Unknown Negative Consequences of Use: Personal relationships Withdrawal Symptoms: Other (Comment)(none) Name of Substance 1: Marijuana 1 - Age of First Use: Adolescent 1 - Amount (size/oz): "1 hit" 1 - Frequency: Daily 1 - Duration: Ongoing 1 - Last Use / Amount: 02/26/18                  Sleep: Fair  Appetite:  Fair  Current Medications: Current Facility-Administered Medications  Medication Dose Route Frequency Provider Last Rate Last Dose  . acetaminophen (TYLENOL) tablet 650 mg  650 mg Oral Q6H PRN Money, Gerlene Burdock, FNP   650 mg at 02/27/18 2143  . alum & mag hydroxide-simeth (MAALOX/MYLANTA) 200-200-20 MG/5ML suspension 30 mL  30 mL Oral Q4H PRN Money, Feliz Beam B, FNP      . diphenhydrAMINE (BENADRYL) capsule 25 mg  25 mg Oral Q6H PRN Money, Gerlene Burdock, FNP       Or  . diphenhydrAMINE (BENADRYL) injection 25 mg  25 mg Intramuscular Q6H PRN Money, Feliz Beam B, FNP      . haloperidol (HALDOL) tablet 5 mg  5 mg Oral Q6H PRN Money, Feliz Beam B, FNP       Or  . haloperidol lactate (HALDOL) injection 5 mg  5 mg Intramuscular Q6H PRN  Money, Gerlene Burdock, FNP      . hydrOXYzine (ATARAX/VISTARIL) tablet 25 mg  25 mg Oral TID PRN Mariel Craft, MD   25 mg at 02/28/18 2123  . lamoTRIgine (LAMICTAL) tablet 200 mg  200 mg Oral Daily Money, Gerlene Burdock, FNP   200 mg at 03/01/18 1610  . LORazepam (ATIVAN) tablet 1 mg  1 mg Oral Q6H PRN Money, Gerlene Burdock, FNP       Or  . LORazepam (ATIVAN) injection 1 mg  1 mg Intramuscular Q6H PRN Money, Feliz Beam B, FNP      . magnesium hydroxide (MILK OF MAGNESIA) suspension 30 mL  30 mL Oral Daily PRN Money, Gerlene Burdock, FNP      . pantoprazole (PROTONIX) EC tablet 40 mg  40 mg Oral Daily Money, Gerlene Burdock, FNP   40 mg at 03/01/18 0738  . psyllium (HYDROCIL/METAMUCIL) packet 1 packet  1 packet Oral QHS Mariel Craft, MD   1 packet at 02/28/18 2121  . risperiDONE (RISPERDAL) tablet 3 mg  3 mg Oral QHS Money, Gerlene Burdock, FNP   3 mg at 02/28/18 2121  . traZODone (DESYREL) tablet 50 mg  50 mg Oral QHS PRN Money, Gerlene Burdock, FNP        Lab Results:  Results for orders placed or performed during the hospital encounter of 02/27/18 (from the past 48 hour(s))  TSH     Status: None   Collection Time: 02/28/18  6:33 AM  Result Value Ref Range   TSH 1.560 0.350 - 4.500 uIU/mL    Comment: Performed by a 3rd Generation assay with a functional sensitivity of <=0.01 uIU/mL. Performed at [facility], [address], [place], [place] [address]     Blood Alcohol level:  Lab Results  Component Value Date   [facility] <10 02/27/2018    Metabolic Disorder Labs: Lab Results  Component Value Date  HGBA1C 5.5 02/24/2018   MPG 111 02/24/2018   No results found for: PROLACTIN Lab Results  Component Value Date   CHOL 90 02/24/2018   TRIG 30 02/24/2018   HDL 51 02/24/2018   CHOLHDL 1.8 02/24/2018   LDLCALC 30 02/24/2018   LDLCALC 32 10/16/2017    Physical Findings: AIMS: Facial and Oral Movements Muscles of Facial Expression: None, normal Lips and Perioral Area: None, normal Jaw: None,  normal Tongue: None, normal,Extremity Movements Upper (arms, wrists, hands, fingers): None, normal Lower (legs, knees, ankles, toes): None, normal, Trunk Movements Neck, shoulders, hips: None, normal, Overall Severity Severity of abnormal movements (highest score from questions above): None, normal Incapacitation due to abnormal movements: None, normal Patient's awareness of abnormal movements (rate only patient's report): No Awareness, Dental Status Current problems with teeth and/or dentures?: No Does patient usually wear dentures?: No  CIWA:  CIWA-Ar Total: 0 COWS:  COWS Total Score: 1  Musculoskeletal: Strength & Muscle Tone: within normal limits Gait & Station: normal Patient leans: N/A  Psychiatric Specialty Exam: Physical Exam  ROS  Blood pressure 137/86, pulse 81, temperature 98.2 F (36.8 C), temperature source Oral, resp. rate 16, height 4' 11.92" (1.522 m), weight 85.3 kg (188 lb), last menstrual period 02/22/2018, SpO2 99 %.Body mass index is 36.81 kg/m.  General Appearance: Fairly Groomed  Eye Contact:  Good  Speech:  Clear and Coherent and Pressured  Volume:  Increased  Mood:  Euphoric  Affect:  Appropriate and Congruent  Thought Process:  Linear and Descriptions of Associations: Intact  Orientation:  Full (Time, Place, and Person)  Thought Content:  Logical  Suicidal Thoughts:  No  Homicidal Thoughts:  No  Memory:  Immediate;   Fair Recent;   Fair  Judgement:  Intact  Insight:  Present  Psychomotor Activity:  Normal and Increased  Concentration:  Concentration: Fair and Attention Span: Fair  Recall:  [employer] of Knowledge:  Fair  Language:  Fair  Akathisia:  No  Handed:  Right  AIMS (if indicated):     Assets:  Communication Skills Desire for Improvement Financial Resources/Insurance Leisure Time Physical Health Social Support Vocational/Educational  ADL's:  Intact  Cognition:  WNL  Sleep:  Number of Hours: 6.75     Treatment Plan  Summary: Daily contact with patient to assess and evaluate symptoms and progress in treatment and Medication management  Treatment Plan/Recommendations:  1 Admit for crisis management and stabilization. Estimated length of stay 5-7 days past his current stay of 1.  2 Individual and group therapy. 3 Medication management for depression, and anxiety to reduce current symptoms to base line and improve the overall levels of functioning: Medications reviewed with the patient and she stated no untoward effects, home medications in place.  Continue the Lamictal 200 mg p.o. daily, risperidone 3 mg p.o. nightly, and trazodone 50 mg p.o. nightly as needed for insomnia.  Patient also has severe agitation protocol ordered to include, Haldol, and Ativan for severe anxiety, psychosis, and moderate agitation. 4 Coping skills for depression and anxiety developing.  5 Continue crisis stabilization and management.  6 Address health issues- monitor vital signs, stable; , orders placed.  7 Treatment plan in progress to prevent relapse prevention and self care.  8 Psychosocial education regarding relapse prevention and self care 9 Heath care follow up as needed for any health concerns 10 Call for consult with hospitalist for additional specialty patient services as needed.   Medications:  -Continue Lamictal 200 mg QD for  mood stabilization  -Continue Risperdal 3mg  at HS for psychotic symptoms   -Continue Trazodone 50 mg at HS PRN for sleep  - Continue Atarax 25 mg tid prn.  - Will not recommend Straterra or Adderall    Mariel Craft, MD 03/01/2018, 5:57 PM

## 2018-03-01 NOTE — Progress Notes (Signed)
Recreation Therapy Notes  Date: 7.22.19 Time: 1000 Location: 500 Hall Dayroom  Group Topic: Coping Skills  Goal Area(s) Addresses:  Patient will be able to identify positive coping skills. Patient will be able to identify benefit of using coping skills post d/c.  Behavioral Response: Engaged  Intervention: Worksheet  Activity: Orthoptist.  Patients were to identify the things that have them "stuck" and write them inside the web.  Patients were to then write positive coping skills on the outside of the web they could use to deal with their struggles.  Education: Pharmacologist, Building control surveyor.   Education Outcome: Acknowledges understanding/In group clarification offered/Needs additional education.   Clinical Observations/Feedback: Pt identified her struggles as anxiety, lack of freedom, self-esteem, money, energy and social life.  Pt also stated she struggles with depression, her step dad and "me time".  Pt identified her coping skills as shopping, music, reading, singing, writing, reaching out for help, exercise, cry it out and daily affirmations.  Pt stated if she used her positive coping skills she would be "happier".    Caroll Rancher, LRT/CTRS     Lillia Abed, Hodan Wurtz A 03/01/2018 2:18 PM

## 2018-03-01 NOTE — Progress Notes (Signed)
Adult Psychoeducational Group Note  Date:  03/01/2018 Time:  9:22 PM  Group Topic/Focus:  Wrap-Up Group:   The focus of this group is to help patients review their daily goal of treatment and discuss progress on daily workbooks.  Participation Level:  Active  Participation Quality:  Appropriate  Affect:  Appropriate  Cognitive:  Appropriate  Insight: Appropriate  Engagement in Group:  Engaged  Modes of Intervention:  Discussion  Additional Comments: The patient expressed that she rates today a 9 and attended groups.The patient also said that she reached all goals.  Octavio Manns 03/01/2018, 9:22 PM

## 2018-03-01 NOTE — Tx Team (Signed)
Interdisciplinary Treatment and Diagnostic Plan Update  03/01/2018 Time of Session: 8:49 AM  Courtney [name] MRN: [id number]  Principal Diagnosis: <principal problem not specified>  Secondary Diagnoses: Active Problems:   Bipolar I disorder, current or most recent episode manic, with psychotic features ([place])   Bipolar disorder, unspecified ([place])   Posttraumatic stress disorder   Cannabis dependence ([place])   Current Medications:  Current Facility-Administered Medications  Medication Dose Route Frequency Provider Last Rate Last Dose  . acetaminophen (TYLENOL) tablet 650 mg  650 mg Oral Q6H PRN Money, Lowry Ram, FNP   650 mg at 02/27/18 2143  . alum & mag hydroxide-simeth (MAALOX/MYLANTA) 200-200-20 MG/5ML suspension 30 mL  30 mL Oral Q4H PRN Money, Darnelle Maffucci B, FNP      . diphenhydrAMINE (BENADRYL) capsule 25 mg  25 mg Oral Q6H PRN Money, Lowry Ram, FNP       Or  . diphenhydrAMINE (BENADRYL) injection 25 mg  25 mg Intramuscular Q6H PRN Money, Darnelle Maffucci B, FNP      . haloperidol (HALDOL) tablet 5 mg  5 mg Oral Q6H PRN Money, Darnelle Maffucci B, FNP       Or  . haloperidol lactate (HALDOL) injection 5 mg  5 mg Intramuscular Q6H PRN Money, Lowry Ram, FNP      . hydrOXYzine (ATARAX/VISTARIL) tablet 25 mg  25 mg Oral TID PRN Lavella Hammock, MD   25 mg at 02/28/18 2123  . lamoTRIgine (LAMICTAL) tablet 200 mg  200 mg Oral Daily Money, Lowry Ram, FNP   200 mg at 03/01/18 8638  . LORazepam (ATIVAN) tablet 1 mg  1 mg Oral Q6H PRN Money, Lowry Ram, FNP       Or  . LORazepam (ATIVAN) injection 1 mg  1 mg Intramuscular Q6H PRN Money, Darnelle Maffucci B, FNP      . magnesium hydroxide (MILK OF MAGNESIA) suspension 30 mL  30 mL Oral Daily PRN Money, Lowry Ram, FNP      . pantoprazole (PROTONIX) EC tablet 40 mg  40 mg Oral Daily Money, Lowry Ram, FNP   40 mg at 03/01/18 0738  . psyllium (HYDROCIL/METAMUCIL) packet 1 packet  1 packet Oral QHS Lavella Hammock, MD   1 packet at 02/28/18 2121  . risperiDONE (RISPERDAL) tablet 3 mg  3  mg Oral QHS Money, Lowry Ram, FNP   3 mg at 02/28/18 2121  . traZODone (DESYREL) tablet 50 mg  50 mg Oral QHS PRN Money, Lowry Ram, FNP        PTA Medications: Medications Prior to Admission  Medication Sig Dispense Refill Last Dose  . amphetamine-dextroamphetamine (ADDERALL XR) 10 MG 24 hr capsule Take 1 capsule by mouth daily.   Past Week at Unknown time  . Ascorbic Acid (VITAMIN C) 1000 MG tablet Take 1,000 mg by mouth daily.   Past Week at Unknown time  . atomoxetine (STRATTERA) 60 MG capsule Take 60 mg by mouth daily.    Past Week at Unknown time  . hydrOXYzine (ATARAX/VISTARIL) 25 MG tablet Take 25 mg by mouth at bedtime as needed for anxiety (Insomnia).    Past Week at Unknown time  . lamoTRIgine (LAMICTAL) 100 MG tablet Take 2 tablets (200 mg total) by mouth daily. 60 tablet 3 Past Week at Unknown time  . Multiple Vitamin (MULTIVITAMIN) tablet Take 1 tablet by mouth daily.   Past Week at Unknown time  . Omega-3 Fatty Acids (FISH OIL) 500 MG CAPS Take 1 capsule by mouth daily.   Past Week  at Unknown time  . Psyllium 55.46 % POWD Take 1 Scoop by mouth daily. 1 Bottle 1 Past Week at Unknown time  . risperiDONE (RISPERDAL) 1 MG tablet Take 3 tablets by mouth at bedtime.   Past Week at Unknown time  . Vitamin E 400 units TABS Take by mouth daily.   Past Week at Unknown time    Patient Stressors: Financial difficulties Marital or family conflict Occupational concerns Substance abuse  Patient Strengths: Agricultural engineer for treatment/growth Supportive family/friends  Treatment Modalities: Medication Management, Group therapy, Case management,  1 to 1 session with clinician, Psychoeducation, Recreational therapy.   Physician Treatment Plan for Primary Diagnosis: <principal problem not specified> Long Term Goal(s): Improvement in symptoms so as ready for discharge  Short Term Goals: Ability to identify changes in lifestyle to reduce recurrence of condition will  improve Ability to verbalize feelings will improve Ability to disclose and discuss suicidal ideas Ability to demonstrate self-control will improve Ability to identify and develop effective coping behaviors will improve Ability to maintain clinical measurements within normal limits will improve Ability to identify triggers associated with substance abuse/mental health issues will improve Ability to identify changes in lifestyle to reduce recurrence of condition will improve Ability to verbalize feelings will improve Ability to disclose and discuss suicidal ideas Ability to demonstrate self-control will improve Ability to identify and develop effective coping behaviors will improve Ability to maintain clinical measurements within normal limits will improve Ability to identify triggers associated with substance abuse/mental health issues will improve  Medication Management: Evaluate patient's response, side effects, and tolerance of medication regimen.  Therapeutic Interventions: 1 to 1 sessions, Unit Group sessions and Medication administration.  Evaluation of Outcomes: Adequate for Discharge  Physician Treatment Plan for Secondary Diagnosis: Active Problems:   Bipolar I disorder, current or most recent episode manic, with psychotic features ([place])   Bipolar disorder, unspecified ([place])   Posttraumatic stress disorder   Cannabis dependence ([place])   Long Term Goal(s): Improvement in symptoms so as ready for discharge  Short Term Goals: Ability to identify changes in lifestyle to reduce recurrence of condition will improve Ability to verbalize feelings will improve Ability to disclose and discuss suicidal ideas Ability to demonstrate self-control will improve Ability to identify and develop effective coping behaviors will improve Ability to maintain clinical measurements within normal limits will improve Ability to identify triggers associated with substance abuse/mental health issues  will improve Ability to identify changes in lifestyle to reduce recurrence of condition will improve Ability to verbalize feelings will improve Ability to disclose and discuss suicidal ideas Ability to demonstrate self-control will improve Ability to identify and develop effective coping behaviors will improve Ability to maintain clinical measurements within normal limits will improve Ability to identify triggers associated with substance abuse/mental health issues will improve  Medication Management: Evaluate patient's response, side effects, and tolerance of medication regimen.  Therapeutic Interventions: 1 to 1 sessions, Unit Group sessions and Medication administration.  Evaluation of Outcomes: Adequate for Discharge   RN Treatment Plan for Primary Diagnosis: <principal problem not specified> Long Term Goal(s): Knowledge of disease and therapeutic regimen to maintain health will improve  Short Term Goals: Ability to identify and develop effective coping behaviors will improve and Compliance with prescribed medications will improve  Medication Management: RN will administer medications as ordered by provider, will assess and evaluate patient's response and provide education to patient for prescribed medication. RN will report any adverse and/or side effects to prescribing provider.  Therapeutic Interventions:  1 on 1 counseling sessions, Psychoeducation, Medication administration, Evaluate responses to treatment, Monitor vital signs and CBGs as ordered, Perform/monitor CIWA, COWS, AIMS and Fall Risk screenings as ordered, Perform wound care treatments as ordered.  Evaluation of Outcomes: Adequate for Discharge   LCSW Treatment Plan for Primary Diagnosis: <principal problem not specified> Long Term Goal(s): Safe transition to appropriate next level of care at discharge, Engage patient in therapeutic group addressing interpersonal concerns.  Short Term Goals: Engage patient in  aftercare planning with referrals and resources  Therapeutic Interventions: Assess for all discharge needs, 1 to 1 time with Social worker, Explore available resources and support systems, Assess for adequacy in community support network, Educate family and significant other(s) on suicide prevention, Complete Psychosocial Assessment, Interpersonal group therapy.  Evaluation of Outcomes: Met  Return home, follow up with current provider   Progress in Treatment: Attending groups:Has attended 50% of groups Participating in groups: Yes when attending Taking medication as prescribed: Yes Toleration medication: Yes, no side effects reported at this time Family/Significant other contact made: No Patient understands diagnosis: Yes AEB asking for help with  Discussing patient identified problems/goals with staff: Yes Medical problems stabilized or resolved: Yes Denies suicidal/homicidal ideation: Yes Issues/concerns per patient self-inventory: None Other: N/A  New problem(s) identified: None identified at this time.   New Short Term/Long Term Goal(s): "I need help with learning more coping skills, and patience.  Discharge Plan or Barriers:   Reason for Continuation of Hospitalization: Anger/rage Depression  Medication stabilization   Estimated Length of Stay: Likely d/c tomorrow  Attendees: Patient: Courtney [name] 03/01/2018  8:49 AM  Physician: Melba Coon, MD 03/01/2018  8:49 AM  Nursing: Sena Hitch, RN 03/01/2018  8:49 AM  RN Care Manager: Lars Pinks, RN 03/01/2018  8:49 AM  Social Worker: Ripley Fraise 03/01/2018  8:49 AM  Recreational Therapist: Winfield Cunas 03/01/2018  8:49 AM  Other: Norberto Sorenson 03/01/2018  8:49 AM  Other:  03/01/2018  8:49 AM    Scribe for Treatment Team:  Roque Lias LCSW 03/01/2018 8:49 AM

## 2018-03-01 NOTE — [facility] Group Notes (Signed)
LCSW Group Therapy Note   03/01/2018 1:15pm   Type of Therapy and Topic:  Group Therapy:  Overcoming Obstacles   Participation Level:  Did Not Attend   Description of Group:    In this group patients will be encouraged to explore what they see as obstacles to their own wellness and recovery. They will be guided to discuss their thoughts, feelings, and behaviors related to these obstacles. The group will process together ways to cope with barriers, with attention given to specific choices patients can make. Each patient will be challenged to identify changes they are motivated to make in order to overcome their obstacles. This group will be process-oriented, with patients participating in exploration of their own experiences as well as giving and receiving support and challenge from other group members.   Therapeutic Goals: 1. Patient will identify personal and current obstacles as they relate to admission. 2. Patient will identify barriers that currently interfere with their wellness or overcoming obstacles.  3. Patient will identify feelings, thought process and behaviors related to these barriers. 4. Patient will identify two changes they are willing to make to overcome these obstacles:      Summary of Patient Progress      Therapeutic Modalities:   Cognitive Behavioral Therapy Solution Focused Therapy Motivational Interviewing Relapse Prevention Therapy  Ida Rogue, LCSW 03/01/2018 4:11 PM

## 2018-03-01 NOTE — Progress Notes (Signed)
Patient has been up in the hallway and nursing station talking with staff tonight. She is anxious but pleasant with pressured speech. She is concerned with how much longer she will be here and Clinical research associate encouraged her to speak with her doctor on tomorrow concerning discharge. She was compliant with her medications. Support given and safety maintained on unit with 15 min checks.

## 2018-03-02 ENCOUNTER — Encounter (HOSPITAL_COMMUNITY): Payer: Self-pay | Admitting: Behavioral Health

## 2018-03-02 MED ORDER — PANTOPRAZOLE SODIUM 40 MG PO TBEC: 40.0000 mg | DELAYED_RELEASE_TABLET | Freq: Every day | ORAL | 0 refills | Status: DC

## 2018-03-02 MED ORDER — RISPERIDONE 1 MG PO TABS: 3.0000 mg | ORAL_TABLET | Freq: Every day | ORAL | 0 refills | Status: DC

## 2018-03-02 MED ORDER — TRAZODONE HCL 50 MG PO TABS: 50.0000 mg | ORAL_TABLET | Freq: Every evening | ORAL | 0 refills | Status: DC | PRN

## 2018-03-02 MED ORDER — HYDROXYZINE HCL 25 MG PO TABS: 25.0000 mg | ORAL_TABLET | Freq: Three times a day (TID) | ORAL | 0 refills | Status: DC | PRN

## 2018-03-02 MED ORDER — LAMOTRIGINE 100 MG PO TABS: 200.0000 mg | ORAL_TABLET | Freq: Every day | ORAL | 0 refills | Status: DC

## 2018-03-02 NOTE — Progress Notes (Signed)
Pt attended AA group this evening.  

## 2018-03-02 NOTE — Progress Notes (Signed)
Recreation Therapy Notes  Date: 7.23.19 Time: 1000 Location: 500 Hall Dayroom  Group Topic: Communication  Goal Area(s) Addresses:  Patient will effectively communicate with peers in group.  Patient will verbalize benefit of healthy communication. Patient will verbalize positive effect of healthy communication on post d/c goals.  Patient will identify communication techniques that made activity effective for group.   Behavioral Response: Engaged  Intervention: Drawings, pencils, blank paper  Activity: Geometrical Drawings.  Patients were put into groups of 2.  Each person would take turns being the listener and the speaker.  The speaker will describe the picture to their partner.  The listener is to draw the picture as it is described to them.  However, the listener can not ask any questions besides "can you repeat that".  Partners would then switch roles and get a different picture.  Education: Communication, Discharge Planning  Education Outcome: Acknowledges understanding/In group clarification offered/Needs additional education.   Clinical Observations/Feedback: Pt stated eye contact was apart of communication.  Pt stated her partner gave detailed instructions.  Pt stated as the speaker, it was hard not being able to confirm things with her partner.  Pt also stated enunciating your words will help others understand what you are saying.      Caroll Rancher, LRT/CTRS      Caroll Rancher A 03/02/2018 11:47 AM

## 2018-03-02 NOTE — Progress Notes (Signed)
Recreation Therapy Notes  Animal-Assisted Activity (AAA) Program Checklist/Progress Notes Patient Eligibility Criteria Checklist & Daily Group note for Rec Tx Intervention  Date: 7.23.19 Time: 1430 Location: 300 Hall Group Room   AAA/T Program Assumption of Risk Form signed by Patient/ or Parent Legal Guardian YES   Patient is free of allergies or sever asthma YES   Patient reports no fear of animals YES  Patient reports no history of cruelty to animals YES   Patient understands his/her participation is voluntary YES   Patient washes hands before animal contact YES   Patient washes hands after animal contact YES   Behavioral Response: Engaged  Education: Charity fundraiser, Appropriate Animal Interaction   Education Outcome: Acknowledges understanding/In group clarification offered/Needs additional education.   Clinical Observations/Feedback:  Pt attended and participated in activity.    Caroll Rancher, LRT/CTRS     Caroll Rancher A 03/02/2018 3:34 PM

## 2018-03-02 NOTE — Progress Notes (Signed)
Recreation Therapy Notes  INPATIENT RECREATION THERAPY ASSESSMENT  Patient Details Name: Courtney [name] MRN: [id number] DOB: [birth year] Today's Date: 03/02/2018       Information Obtained From: Patient  Able to Participate in Assessment/Interview: Yes  Patient Presentation: Oriented, Alert  Reason for Admission (Per Patient): Self-injurious Behavior, Other (Comments)(Pt stated she was having thoughts of hurting herself; Needed to get out, felt she was going to hurt her step dad)  Patient Stressors: Family, Other (Comment)(Step dad; Surveyor, quantity)  Coping Skills:   Self-Injury, Journal, Sports, Arguments, Music, Exercise, Impulsivity, Talk, Art, Prayer, Other (Comment), Avoidance, Intrusive Behavior, Read, Dance, Hot Bath/Shower(Netflix)  Leisure Interests (2+):  Exercise - Lifting Weights, Individual - Writing, Individual - Other (Comment), Music - Singing(Do make up)  Frequency of Recreation/Participation: Other (Comment)(Daily)  Awareness of Community Resources:  Yes  Community Resources:  Research scientist (physical sciences), Nutritional therapist, Other (Comment)(Bars)  Current Use: Yes  If no, Barriers?:    Expressed Interest in [employer] Information: No  [place]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

## 2018-03-02 NOTE — Progress Notes (Signed)
Patient ID: Courtney [name], female   DOB: [birth year], 27 y.o.   MRN: [id number] Pt continue to be very loud and hyper-verbal. Pt denied all, "I just had to get away from my stepfather; I felt better immediately I got into the EMS vehicle." Pt admitted to be manic, "I will rather be manic than to be depressed." Support, encouragement, and safe environment provided. 15-minute safety checks continue. Pt attended wrap-up group. Pt was med compliant.

## 2018-03-02 NOTE — Progress Notes (Signed)
D: Patient observed up and restless on unit. Mood elevated, patient intrusive. Patient states she is leaving tomorrow and hopes to leave as early as possible. "My brother wants me home early, before he goes to work." Patient's affect animated, speech somewhat tangential. Reports various somatic complaints.   A: Medicated per orders, prn trazadone given for sleep. Medication education provided. Level III obs in place for safety. Emotional support offered. Patient encouraged to complete Suicide Safety Plan before discharge. Encouraged to attend and participate in unit programming.    R: Patient verbalizes understanding of POC. On reassess, patient states she cannot sleep however has been in the dayroom socializing. Instructed to go to bed and patient complied. Patient denies SI/HI/AVH and remains safe on level III obs. Will continue to monitor throughout the night.
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
## 2018-03-02 NOTE — Progress Notes (Signed)
[facility] MD Progress Note  03/02/2018 6:56 PM Courtney [name]  MRN:  [id number]     Courtney [name] is seen, chart reviewed. The chart findings discussed with the treatment team. Courtney [name] is seen. Patient was set for discharge, but was unable to have safe transportation or place to stay.  Her mother is in hospital, and patient is unable to go home to step-father.  She has a Veterinary surgeon that is helping her to arrange safe housing tomorrow. Patient is distraught about her circumstances. She is hopeful for discharge tomorrow. Patient makes several requests for personal belongings in order to prepare for discharge, and gets upset when she can not have them due to it being a violation of unit rules.  Patient is transferred to 300 unit to allow for more group therapy. Reports a good appetite and states she is resting well. Courtney [name] denies any symptoms of depression, SIHI, AVH, delusional thoughts or paranoia, and does not appear to be responding to any internal stimuli. Patient is visible on the milieu.  Patient seen attending group sessions with active and engaged participation. Courtney [name] has agreed to continue the current plan of care already in progress. She denies any other issues or concerns. Support encouragement reassurance was provided.    HPI:  27 year old female with past psychiatric history, admitted to the hospital for suicidal homicidal ideations after increased stressors at home.  Patient presents with anxious affect, and mood is congruent.  She presents with pressured speech, flight of ideas, tangentiality, and increased volume.  She remains alert and oriented, calm and cooperative.  She is currently minimizing her depression, anxiety, and helplessness at this time rating both 0 out of 10 with 10 being the worst.  Patient is observed interacting well with peers and milieu, however continues to present with rapid speech.  There does not appear to be any obvious thought blocking,  preoccupations, and or response to internal stimuli.  She was previously started on medication to include hydroxyzine, Lamictal, and risperidone which were her home medications.  She was also started on Adderall XR and Strattera which is being held during his hospital admission.  She is  denied any suicidal, homicidal, and or auditory visual hallucinations.   Principal Problem: Bipolar disorder, unspecified (HCC) Diagnosis:   Patient Active Problem List   Diagnosis Date Noted  . Bipolar disorder, unspecified (HCC) [F31.9] 02/27/2018  . Posttraumatic stress disorder [F43.10]   . Cannabis dependence (HCC) [F12.20]   . PTSD (post-traumatic stress disorder) [F43.10] 12/31/2017  . Bipolar I disorder, current or most recent episode manic, with psychotic features (HCC) [F31.2] 12/31/2017  . Anxiety [F41.9] 12/31/2017  . OCD (obsessive compulsive disorder) [F42.9] 12/31/2017  . ADHD [F90.9] 12/31/2017  . Prediabetes [R73.03] 12/31/2017   Total Time spent with patient: 35 min  Past Psychiatric History: Patient admitted 2/20 hospitalizations while she lived in [place].  This is her first hospitalization at [facility].  She has been on multiple medications in the past.  [facility] issues.  Past Medical History:  Past Medical History:  Diagnosis Date  . ADHD    As a child  . Anxiety   . Bipolar 1 disorder (HCC)   . OCD (obsessive compulsive disorder)   . PTSD (post-traumatic stress disorder)   . Pyloric stenosis     Past Surgical History:  Procedure Laterality Date  . APPENDECTOMY  02/17/2014   Family History:  Family History  Problem Relation Age of Onset  .  COPD Mother   . Hypertension Mother   . Asthma Mother   . Arthritis Mother   . Pancreatic cancer Mother        slow growing  . ADD / ADHD Mother   . Other Mother        Spinal Stenosis - currently in surgery today  . Bipolar disorder Father   . Arthritis Father   .  Hypercholesterolemia Father   . Asthma Brother   . Hernia Brother   . ADD / ADHD Brother   . Hypertension Maternal Grandmother   . Heart disease Maternal Grandmother 58  . Rheumatic fever Maternal Grandmother   . Heart attack Maternal Grandmother        Bypass Surgery  . Pancreatic cancer Maternal Grandfather   . Liver cancer Maternal Grandfather   . Obesity Paternal Grandmother    Family Psychiatric  History: She stated she had a family history of bipolar disorder as well as depression.  She stated her mother had bipolar disorder.   Social History:  Social History   Substance and Sexual Activity  Alcohol Use Yes   Comment: occasionally drink a beer     Social History   Substance and Sexual Activity  Drug Use Yes  . Types: Marijuana   Comment: cocaine in the past but not currently     Social History   Socioeconomic History  . Marital status: Single    Spouse name: Not on file  . Number of children: 0  . Years of education: Not on file  . Highest education level: Not on file  Occupational History  . Occupation: disability     Comment: mental health   Social Needs  . Financial resource strain: Somewhat hard  . Food insecurity:    Worry: Never true    Inability: Never true  . Transportation needs:    Medical: No    Non-medical: No  Tobacco Use  . Smoking status: Former Smoker    Packs/day: 0.25    Years: 10.00    Pack years: 2.50    Types: Cigarettes    Start date: 01/01/2008  . Smokeless tobacco: Never Used  Substance and Sexual Activity  . Alcohol use: Yes    Comment: occasionally drink a beer  . Drug use: Yes    Types: Marijuana    Comment: cocaine in the past but not currently   . Sexual activity: Yes    Partners: Male    Birth control/protection: Condom  Lifestyle  . Physical activity:    Days per week: 5 days    Minutes per session: 60 min  . Stress: Rather much  Relationships  . Social connections:    Talks on phone: More than three times a  week    Gets together: Three times a week    Attends religious service: More than 4 times per year    Active member of club or organization: Yes    Attends meetings of clubs or organizations: More than 4 times per year    Relationship status: Never married  Other Topics Concern  . Not on file  Social History Narrative   Moved to [place] from [facility] to be closer to family. Parents moved here in 2018.   On disability for bipolar disorder since young age, had to be placed in a group home and foster home because of behavior. She is now living with parents since she decided to take her medications.    Additional Social History:  Pain Medications: See MAR Prescriptions: See MAR Over the Counter: See MAR History of alcohol / drug use?: Yes Longest period of sobriety (when/how long): Unknown Negative Consequences of Use: Personal relationships Withdrawal Symptoms: Other (Comment)(none) Name of Substance 1: Marijuana 1 - Age of First Use: Adolescent 1 - Amount (size/oz): "1 hit" 1 - Frequency: Daily 1 - Duration: Ongoing 1 - Last Use / Amount: 02/26/18                  Sleep: Good  Appetite:  Good  Current Medications: Current Facility-Administered Medications  Medication Dose Route Frequency Provider Last Rate Last Dose  . acetaminophen (TYLENOL) tablet 650 mg  650 mg Oral Q6H PRN Money, Gerlene Burdock, FNP   650 mg at 02/27/18 2143  . alum & mag hydroxide-simeth (MAALOX/MYLANTA) 200-200-20 MG/5ML suspension 30 mL  30 mL Oral Q4H PRN Money, Feliz Beam B, FNP      . diphenhydrAMINE (BENADRYL) capsule 25 mg  25 mg Oral Q6H PRN Money, Gerlene Burdock, FNP       Or  . diphenhydrAMINE (BENADRYL) injection 25 mg  25 mg Intramuscular Q6H PRN Money, Feliz Beam B, FNP      . haloperidol (HALDOL) tablet 5 mg  5 mg Oral Q6H PRN Money, Feliz Beam B, FNP       Or  . haloperidol lactate (HALDOL) injection 5 mg  5 mg Intramuscular Q6H PRN Money, Gerlene Burdock, FNP      . hydrOXYzine (ATARAX/VISTARIL) tablet 25 mg  25 mg Oral  TID PRN Mariel Craft, MD   25 mg at 02/28/18 2123  . lamoTRIgine (LAMICTAL) tablet 200 mg  200 mg Oral Daily Money, Gerlene Burdock, FNP   200 mg at 03/02/18 0829  . LORazepam (ATIVAN) tablet 1 mg  1 mg Oral Q6H PRN Money, Gerlene Burdock, FNP       Or  . LORazepam (ATIVAN) injection 1 mg  1 mg Intramuscular Q6H PRN Money, Feliz Beam B, FNP      . magnesium hydroxide (MILK OF MAGNESIA) suspension 30 mL  30 mL Oral Daily PRN Money, Gerlene Burdock, FNP      . pantoprazole (PROTONIX) EC tablet 40 mg  40 mg Oral Daily Money, Gerlene Burdock, FNP   40 mg at 03/02/18 0829  . psyllium (HYDROCIL/METAMUCIL) packet 1 packet  1 packet Oral QHS Mariel Craft, MD   1 packet at 03/01/18 2114  . risperiDONE (RISPERDAL) tablet 3 mg  3 mg Oral QHS Money, Gerlene Burdock, FNP   3 mg at 03/01/18 2052  . traZODone (DESYREL) tablet 50 mg  50 mg Oral QHS PRN Money, Gerlene Burdock, FNP   50 mg at 03/01/18 2052    Lab Results:  No results found for this or any previous visit (from the past 48 hour(s)).  Blood Alcohol level:  Lab Results  Component Value Date   ETH <10 02/27/2018    Metabolic Disorder Labs: Lab Results  Component Value Date   HGBA1C 5.5 02/24/2018   MPG 111 02/24/2018   No results found for: PROLACTIN Lab Results  Component Value Date   CHOL 90 02/24/2018   TRIG 30 02/24/2018   HDL 51 02/24/2018   CHOLHDL 1.8 02/24/2018   LDLCALC 30 02/24/2018   LDLCALC 32 10/16/2017    Physical Findings: AIMS: Facial and Oral Movements Muscles of Facial Expression: None, normal Lips and Perioral Area: None, normal Jaw: None, normal Tongue: None, normal,Extremity Movements Upper (arms, wrists, hands, fingers): None, normal Lower (legs, knees,  ankles, toes): None, normal, Trunk Movements Neck, shoulders, hips: None, normal, Overall Severity Severity of abnormal movements (highest score from questions above): None, normal Incapacitation due to abnormal movements: None, normal Patient's awareness of abnormal movements (rate only  patient's report): No Awareness, Dental Status Current problems with teeth and/or dentures?: No Does patient usually wear dentures?: No  CIWA:  CIWA-Ar Total: 0 COWS:  COWS Total Score: 1  Musculoskeletal: Strength & Muscle Tone: within normal limits Gait & Station: normal Patient leans: N/A  Psychiatric Specialty Exam: Physical Exam  Nursing note and vitals reviewed. Constitutional: She is oriented to person, place, and time.  Neurological: She is alert and oriented to person, place, and time.  Psychiatric:  Intrusive, at nurses desk with multiple requests and emotionally labile when doesn't get what requested      Review of Systems  Constitutional: Negative.   Respiratory: Negative.   Cardiovascular: Negative.   Gastrointestinal: Negative.   Musculoskeletal: Negative.   Neurological: Negative.   Psychiatric/Behavioral: Negative for depression, hallucinations, substance abuse and suicidal ideas. The patient is not nervous/anxious and does not have insomnia.     Blood pressure 139/79, pulse 80, temperature 99.1 F (37.3 C), resp. rate 16, height 4' 11.92" (1.522 m), weight 85.3 kg (188 lb), last menstrual period 02/22/2018, SpO2 99 %.Body mass index is 36.81 kg/m.  General Appearance: Neat  Eye Contact:  Good  Speech:  Clear and Coherent and Pressured  Volume:  Increased  Mood:  Irritable  Affect:  Congruent  Thought Process:  Linear and Descriptions of Associations: Intact  Orientation:  Full (Time, Place, and Person)  Thought Content:  Logical  Suicidal Thoughts:  No  Homicidal Thoughts:  No  Memory:  Immediate;   Fair Recent;   Fair  Judgement:  Intact  Insight:  Present  Psychomotor Activity:  Normal and Increased  Concentration:  Concentration: Fair and Attention Span: Fair  Recall:  [employer] of Knowledge:  Fair  Language:  Fair  Akathisia:  No  Handed:  Right  AIMS (if indicated):     Assets:  Communication Skills Desire for Improvement Financial  Resources/Insurance Leisure Time Physical Health Social Support Vocational/Educational  ADL's:  Intact  Cognition:  WNL  Sleep:  Number of Hours: 6.75     Treatment Plan Summary: Daily contact with patient to assess and evaluate symptoms and progress in treatment and Medication management  Treatment Plan/Recommendations:   Continue inpatient admission  Medication:  Continue the Lamictal 200 mg p.o. Daily for mood stabilization Continue Risperdal 3 mg HS for psychotic features.   PRN medications: trazodone 50 mg p.o. nightly as needed for insomnia.  Patient also has severe agitation protocol ordered to include, Haldol, and Ativan for severe anxiety, psychosis, and moderate agitation.   Individual and group therapy. -Coping skills for depression and anxiety developing.  - Continue crisis stabilization and management.  - Address health issues- monitor vital signs, stable; , orders placed.  - Treatment plan in progress to prevent relapse prevention and self care.  - Psychosocial education regarding relapse prevention and self care - Heath care follow up as needed for any health concerns - Call for consult with hospitalist for additional specialty patient services as needed.   Disposition planning ongoing.    - Will not recommend Straterra or Adderall    Mariel Craft, MD 03/02/2018, 6:56 PM

## 2018-03-02 NOTE — Plan of Care (Signed)
Problem: Medication: Goal: Compliance with prescribed medication regimen will improve Outcome: Progressing   Problem: Safety: Goal: Ability to disclose and discuss suicidal ideas will improve Outcome: Progressing D: Pt presents with irritable / labile mood blunted affect and crying spells at intervals during shift. Verbally abusive towards staff "I'm using my fucking phone and you're disrespecting me" when staff attempted to review unit rules with her in relation to phone use. Pt was later came up to staff and was apologetic for her behavior earlier "I'm sorry for everything, Denies SI, HI, AVH and pain. Rates her depression, anxiety and hopelessness all 0/10. Reports she's slept well last night with good appetite, normal energy and good concentration level.  A: Introduced self to pt. Emotional support and availability provided. All medications given with verbal education and effects monitored. Safety checks maintained at Q 15 minutes intervals without outburst or self harm gestures.   R: Pt receptive to care. Compliant with medications when offered. Denies adverse drug reactions when assessed this shift. Tolerates all PO intake well. Remains safe on and off unit.

## 2018-03-02 NOTE — BHH Group Notes (Signed)
LCSW Group Therapy Notes 03/02/2018 1:15pm Type of Therapy and Topic:  Group Therapy:  Communication Participation Level:  Active  Description of Group: Patients will identify how individuals communicate with one another appropriately and inappropriately.  Patients will be guided to discuss their thoughts, feelings and behaviors related to barriers when communicating.  The group will process together ways to execute positive and appropriate communication with attention given to how one uses behavior, tone and body language.  Patients will be encouraged to reflect on a situation where they were successfully able to communicate and what made this example successful.  Group will identify specific changes they are motivated to make in order to overcome communication barriers with self, peers, authority, and parents.  This group will be process-oriented with patients participating in exploration of their own experiences, giving and receiving support, and challenging self and other group members.   Therapeutic Goals 1. Patient will identify how people communicate (body language, facial expression, and electronics).  Group will also discuss tone, voice and how these impact what is communicated and what is received. 2. Patient will identify feelings (such as fear or worry), thought process and behaviors related to why people internalize feelings rather than express self openly. 3. Patient will identify two changes they are willing to make to overcome communication barriers 4. Members will then practice through role play how to communicate using I statements, I feel statements, and acknowledging feelings rather than displacing feelings on others Summary of Patient Progress: Stayed the entire time, engaged throughout.  Cited friends both in [place] and here as supports.  Stated they give her advice and support, and she reciprocates.  Therapeutic Modalities Cognitive Behavioral Therapy Motivational  Interviewing Solution Focused Therapy  Courtney [name], [place] 03/02/2018 2:07 PM

## 2018-03-03 NOTE — Progress Notes (Signed)
Patient was cheerful and elevated upon initial approach. Patient voiced no concerns, denies SI HI AVH. Patient compliant with medications prescribed per provider. No questions or concerns.  Safety maintained with 15 minute checks as well as environmental checks. Will continue to monitor.

## 2018-03-03 NOTE — Progress Notes (Signed)
Discharge note: Patient reviewed discharge paperwork with RN including prescriptions, follow up appointments, and lab work. Patient given the opportunity to ask questions. All concerns were addressed. All belongings were returned to patient. Denied SI/HI/AVH. Patient thanked staff for their care while at the hospital.  Patient was discharged to lobby with taxi voucher in hand. Writer walked patient to taxi.

## 2018-03-03 NOTE — BHH Suicide Risk Assessment (Signed)
[facility] Discharge Suicide Risk Assessment   Principal Problem: Bipolar disorder, unspecified [facility]) Discharge Diagnoses:  Patient Active Problem List   Diagnosis Date Noted  . Bipolar disorder, unspecified (HCC) [F31.9] 02/27/2018  . Posttraumatic stress disorder [F43.10]   . Cannabis dependence (HCC) [F12.20]   . PTSD (post-traumatic stress disorder) [F43.10] 12/31/2017  . Bipolar I disorder, current or most recent episode manic, with psychotic features (HCC) [F31.2] 12/31/2017  . Anxiety [F41.9] 12/31/2017  . OCD (obsessive compulsive disorder) [F42.9] 12/31/2017  . ADHD [F90.9] 12/31/2017  . Prediabetes [R73.03] 12/31/2017    Total Time spent with patient: 35 min   HPI:  27 year old female with past psychiatric history, admitted to the hospital for suicidal homicidal ideations after increased stressors at home.  Patient presents with anxious affect, and mood is congruent.  She presents with pressured speech, flight of ideas, tangentiality, and increased volume.  She remains alert and oriented, calm and cooperative.  She is currently minimizing her depression, anxiety, and helplessness at this time rating both 0 out of 10 with 10 being the worst.  Patient is observed interacting well with peers and milieu, however continues to present with rapid speech.  There does not appear to be any obvious thought blocking, preoccupations, and or response to internal stimuli.  She was previously started on medication to include hydroxyzine, Lamictal, and risperidone which were her home medications.  She was also started on Adderall XR and Strattera which is being held during his hospital admission.  She is  denied any suicidal, homicidal, and or auditory visual hallucinations.   Courtney [name] is seen, chart reviewed. The chart findings discussed with the treatment team. Patient was set for discharge yesterday, but was unable to have safe transportation or place to stay.  Her mother is in hospital, and  patient is unable to go home to step-father.  She has been able to secure safe housing, and transportation by her brother. She is optimistic about the plan.  She is smiling, reports her mood as "good and feels ready to discharge". Reports a good appetite and states she is resting well. Courtney [name] denies any symptoms of depression, SIHI, AVH, delusional thoughts or paranoia, and does not appear to be responding to any internal stimuli. Patient is visible on the milieu. She has attended group sessions with active and engaged participation. Courtney [name] agreed to continue the current plan of care already in progress. She was able to engage in safety planning including plan to return to [facility] or contact emergency services if she feels unable to maintain her own safety or the safety of others. Pt had no further questions, comments, or concerns. She denies any other issues or concerns. Support encouragement reassurance was provided.  Musculoskeletal: Strength & Muscle Tone: within normal limits Gait & Station: normal Patient leans: N/A  Psychiatric Specialty Exam: Review of Systems  Constitutional: Negative.   Respiratory: Negative.   Cardiovascular: Negative.   Gastrointestinal: Negative.   Genitourinary: Negative.   Musculoskeletal: Negative.   Neurological: Negative.   Psychiatric/Behavioral: Negative for depression, hallucinations, substance abuse and suicidal ideas. The patient is not nervous/anxious and does not have insomnia.     Blood pressure 127/90, pulse 99, temperature 98.3 F (36.8 C), temperature source Oral, resp. rate 16, height 4' 11.92" (1.522 m), weight 85.3 kg (188 lb), last menstrual period 02/22/2018, SpO2 99 %.Body mass index is 36.81 kg/m.  General Appearance: Casual and Neat  Eye Contact::  Good  Speech:  Clear and Coherent and Normal Rate409  Volume:  Normal  Mood:  Euthymic  Affect:  Appropriate and Congruent  Thought Process:  Coherent, Goal Directed,  Linear and Descriptions of Associations: Intact  Orientation:  Full (Time, Place, and Person)  Thought Content:  Logical and Hallucinations: None  Suicidal Thoughts:  No  Homicidal Thoughts:  No  Memory:  Immediate;   Good Recent;   Good Remote;   Good  Judgement:  Fair  Insight:  Fair  Psychomotor Activity:  Normal  Concentration:  Good  Recall:  Good  Fund of Knowledge:Good  Language: Good  Akathisia:  No  AIMS (if indicated):     Assets:  Communication Skills Desire for Improvement Housing Social Support Transportation  Sleep:  Number of Hours: 6.25  Cognition: WNL  ADL's:  Intact   Mental Status Per Nursing Assessment::   On Admission:  NA(pt denies)  Demographic Factors:  Adolescent or young adult, Caucasian, Low socioeconomic status and Unemployed  Loss Factors: No longer able to live with mother and step-father  Historical Factors: Prior suicide attempts, Family history of mental illness or substance abuse, Impulsivity and behavioral problems  Risk Reduction Factors:   Positive social support, Positive therapeutic relationship and Positive coping skills or problem solving skills  Continued Clinical Symptoms:  Bipolar Disorder:   Mixed State  In remission  Cognitive Features That Contribute To Risk:  None    Suicide Risk:  Minimal: No identifiable suicidal ideation.  Patients presenting with no risk factors but with morbid ruminations; may be classified as minimal risk based on the severity of the depressive symptoms  Follow-up Information    [facility] Follow up on 03/05/2018.   Why:  Friday at 10AM with Nickie Retort. Also, please follow up with Judee Clara for medication management. Contact information: [address] [place], [place] [address] Phone: [phone] F: [phone]          Plan Of Care/Follow-up recommendations:  Activity:  as tolerated Diet:  as tolerated   Continue medications as per  hospitalization:  Continue the Lamictal 200 mg p.o. Daily for mood stabilization Continue Risperdal 3 mg HS for psychotic features. Do not recommend restarting Adderall after discharge, and use of Strattera under supervision of prescribing provider.  PRN medications: trazodone 50 mg p.o. nightly as needed for insomnia.    She was able to engage in safety planning including plan to return to [facility] or contact emergency services if she feels unable to maintain her own safety or the safety of others. Pt had no further questions, comments, or concerns.    Discharge 03/03/18 with travel voucher to care of her brother.  She was able to engage in safety planning including plan to return to [facility] or contact emergency services if she feels unable to maintain her own safety or the safety of others. Pt had no further questions, comments, or concerns.     Mariel Craft, MD 03/03/2018, 9:23 AM

## 2018-03-03 NOTE — Progress Notes (Signed)
Recreation Therapy Notes  Date: 7.24.19 Time: 0930 Location: [address] Dayroom  Group Topic: Stress Management  Goal Area(s) Addresses:  Patient will verbalize importance of using healthy stress management.  Patient will identify positive emotions associated with healthy stress management.   Behavioral Response: Engaged  Intervention: Stress Management  Activity : Meditation.  LRT introduced the stress management technique of meditation.  LRT played a meditation that focused on choices.  Patients were to follow along as meditation was read to fully engage in the activity.  Education:  Stress Management, Discharge Planning.   Education Outcome: Acknowledges edcuation/In group clarification offered/Needs additional education  Clinical Observations/Feedback: Pt attended and participated in activity.    Caroll Rancher, LRT/CTRS     Lillia Abed, Analyah Mcconnon A 03/03/2018 10:41 AM

## 2018-03-03 NOTE — Plan of Care (Signed)
  Problem: Coping: Goal: Coping ability will improve Outcome: Adequate for Discharge   Problem: Health Behavior/Discharge Planning: Goal: Identification of resources available to assist in meeting health care needs will improve Outcome: Adequate for Discharge   Problem: Medication: Goal: Compliance with prescribed medication regimen will improve Outcome: Adequate for Discharge   Problem: Education: Goal: Knowledge of [facility] Education information/materials will improve Outcome: Adequate for Discharge Goal: Emotional status will improve Outcome: Adequate for Discharge Goal: Mental status will improve Outcome: Adequate for Discharge Goal: Verbalization of understanding the information provided will improve Outcome: Adequate for Discharge   Problem: Coping: Goal: Ability to identify and develop effective coping behavior will improve Outcome: Adequate for Discharge   Problem: Self-Concept: Goal: Ability to identify factors that promote anxiety will improve Outcome: Adequate for Discharge Goal: Level of anxiety will decrease Outcome: Adequate for Discharge Goal: Ability to modify response to factors that promote anxiety will improve Outcome: Adequate for Discharge   Problem: Education: Goal: Utilization of techniques to improve thought processes will improve Outcome: Adequate for Discharge Goal: Knowledge of the prescribed therapeutic regimen will improve Outcome: Adequate for Discharge   Problem: Activity: Goal: Interest or engagement in leisure activities will improve Outcome: Adequate for Discharge Goal: Imbalance in normal sleep/wake cycle will improve Outcome: Adequate for Discharge   Problem: Coping: Goal: Coping ability will improve Outcome: Adequate for Discharge Goal: Will verbalize feelings Outcome: Adequate for Discharge   Problem: Health Behavior/Discharge Planning: Goal: Ability to make decisions will improve Outcome: Adequate for  Discharge Goal: Compliance with therapeutic regimen will improve Outcome: Adequate for Discharge   Problem: Role Relationship: Goal: Will demonstrate positive changes in social behaviors and relationships Outcome: Adequate for Discharge   Problem: Safety: Goal: Ability to disclose and discuss suicidal ideas will improve Outcome: Adequate for Discharge Goal: Ability to identify and utilize support systems that promote safety will improve Outcome: Adequate for Discharge   Problem: Self-Concept: Goal: Will verbalize positive feelings about self Outcome: Adequate for Discharge Goal: Level of anxiety will decrease Outcome: Adequate for Discharge

## 2018-03-03 NOTE — [facility] Suicide Risk Assessment (Signed)
[facility] INPATIENT:  Family/Significant Other Suicide Prevention Education  Suicide Prevention Education:  Patient Refusal for Family/Significant Other Suicide Prevention Education: The patient Courtney [name] has refused to provide written consent for family/significant other to be provided Family/Significant Other Suicide Prevention Education during admission and/or prior to discharge.  Physician notified.  SPE completed with pt, as pt refused to consent to family contact. SPI pamphlet provided to pt and pt was encouraged to share information with support network, ask questions, and talk about any concerns relating to SPE. Pt denies access to guns/firearms and verbalized understanding of information provided. Mobile Crisis information also provided to pt.   Rona Ravens LCSW 03/03/2018, 8:56 AM

## 2018-03-03 NOTE — Progress Notes (Signed)
  [facility] Adult Case Management Discharge Plan :  Will you be returning to the same living situation after discharge:  No. Pt plans to go to brother's home.  At discharge, do you have transportation home?: Yes,  taxi voucher Do you have the ability to pay for your medications: Yes,  mental health  Release of information consent forms completed and submitted to medical records by CSW.  Patient to Follow up at: Follow-up Information    [facility] Follow up on 03/05/2018.   Why:  Friday at 10AM with Nickie Retort. Also, please follow up with Judee Clara for medication management. Contact information: [address] [place], [place] [address] Phone: [phone] F: [phone]          Next level of care provider has access to [facility] Link:no  Safety Planning and Suicide Prevention discussed: Yes,  SPE completed with pt; collateral contact made with pt's mother by [facility] LCSW. SPI pamphlet and Mobile Crisis information provided to pt.   Have you used any form of tobacco in the last 30 days? (Cigarettes, Smokeless Tobacco, Cigars, and/or Pipes): Yes  Has patient been referred to the Quitline?: Patient refused referral  Patient has been referred for addiction treatment: Yes  Rona Ravens, LCSW 03/03/2018, 8:54 AM

## 2018-03-19 DIAGNOSIS — F321 Major depressive disorder, single episode, moderate: Secondary | ICD-10-CM | POA: Insufficient documentation

## 2018-03-19 DIAGNOSIS — F419 Anxiety disorder, unspecified: Secondary | ICD-10-CM | POA: Insufficient documentation

## 2018-03-22 ENCOUNTER — Encounter: Payer: Self-pay | Admitting: Family Medicine

## 2018-03-24 ENCOUNTER — Telehealth: Payer: Self-pay

## 2018-03-24 NOTE — Telephone Encounter (Signed)
Pt called stating she is having sharp stomach pain, burping, burning sensation in stomach near "belly button". She is barely eating. What do you advise?

## 2018-03-30 NOTE — Telephone Encounter (Signed)
Left message that a stool specimen ordered on 6/4 for f/u to H Pylori does not look like this was done, to have this completed to make sure infection was cleared up and further assessment after that.

## 2018-04-19 ENCOUNTER — Ambulatory Visit: Payer: Medicaid Other | Admitting: Gastroenterology

## 2018-04-19 ENCOUNTER — Encounter: Payer: Self-pay | Admitting: Gastroenterology

## 2018-04-19 DIAGNOSIS — K5900 Constipation, unspecified: Secondary | ICD-10-CM

## 2018-04-30 NOTE — Telephone Encounter (Signed)
Pt was no show for 04/19/2018 appointment.

## 2018-05-31 ENCOUNTER — Ambulatory Visit: Payer: Medicaid Other | Admitting: Family Medicine

## 2018-06-26 ENCOUNTER — Other Ambulatory Visit: Payer: Self-pay

## 2018-06-26 ENCOUNTER — Emergency Department
Admission: EM | Admit: 2018-06-26 | Discharge: 2018-06-26 | Disposition: A | Discharge status: Home / Self Care | Payer: Medicaid Other | Attending: Emergency Medicine | Admitting: Emergency Medicine

## 2018-06-26 ENCOUNTER — Encounter: Payer: Self-pay | Admitting: Emergency Medicine

## 2018-06-26 DIAGNOSIS — Z760 Encounter for issue of repeat prescription: Secondary | ICD-10-CM

## 2018-06-26 DIAGNOSIS — F419 Anxiety disorder, unspecified: Secondary | ICD-10-CM | POA: Insufficient documentation

## 2018-06-26 DIAGNOSIS — Z79899 Other long term (current) drug therapy: Secondary | ICD-10-CM | POA: Insufficient documentation

## 2018-06-26 DIAGNOSIS — F429 Obsessive-compulsive disorder, unspecified: Secondary | ICD-10-CM | POA: Diagnosis not present

## 2018-06-26 DIAGNOSIS — F909 Attention-deficit hyperactivity disorder, unspecified type: Secondary | ICD-10-CM | POA: Insufficient documentation

## 2018-06-26 DIAGNOSIS — F4310 Post-traumatic stress disorder, unspecified: Secondary | ICD-10-CM | POA: Insufficient documentation

## 2018-06-26 DIAGNOSIS — F319 Bipolar disorder, unspecified: Secondary | ICD-10-CM

## 2018-06-26 DIAGNOSIS — Z87891 Personal history of nicotine dependence: Secondary | ICD-10-CM | POA: Insufficient documentation

## 2018-06-26 MED ORDER — HYDROXYZINE HCL 50 MG PO TABS: ORAL_TABLET | ORAL | 0 refills | Status: AC

## 2018-06-26 MED ORDER — HYDROXYZINE HCL 50 MG PO TABS: ORAL_TABLET | ORAL | 0 refills | Status: DC

## 2018-06-26 MED ORDER — RISPERIDONE 2 MG PO TABS: 4.0000 mg | ORAL_TABLET | Freq: Every day | ORAL | 0 refills | Status: DC

## 2018-06-26 MED ORDER — LAMOTRIGINE 100 MG PO TABS: 200.0000 mg | ORAL_TABLET | Freq: Every day | ORAL | 0 refills | Status: DC

## 2018-06-26 NOTE — Discharge Instructions (Addendum)
Take the medications as prescribed.  We have provided a one-month supply.  You need to follow-up with a primary care doctor and/her mental health provider within the next month to continue your medications.  You should also have a PPD test placed to screen for tuberculosis.  Return to the ER for any new or acute medical issues.

## 2018-06-26 NOTE — ED Notes (Signed)
Pt to ED for med refill , denies SI/HI . States concussion x72months ago and continue memory issues.

## 2018-06-26 NOTE — ED Triage Notes (Addendum)
States needs refill for respiradol, lamictal, and vistiril. States has been off meds for 2 days. Denies SI. Arrives with duffle bag, states just got in from [facility] and will be staying with parents. Also states had a fall in September and has headaches since. States lost glasses 2 months ago and has not been using since and vision is poor.

## 2018-06-26 NOTE — ED Provider Notes (Signed)
[facility] Emergency Department Provider Note ____________________________________________   First MD Initiated Contact with Patient 06/26/18 [phone]     (approximate)  I have reviewed the triage vital signs and the nursing notes.   HISTORY  Chief Complaint Medication Refill and Headache    HPI Courtney [name] is a 27 y.o. female with PMH as noted below who presents with multiple complaints.  1.  The patient was recently released from jail and states that she ran out of several of her medications a few days ago.  She requests prescriptions for Risperdal, Lamictal, and Vistaril.  She denies any acute psychiatric symptoms.  2.  The patient states that while she was in jail a few months ago she slipped and fell.  She has had some intermittent "memory issues" since that time.  She denies any severe headaches, weakness or numbness, vision changes, or other neurologic symptoms  3.  The patient wanted to know if there are any diseases she should be tested for after having been released from jail.   Past Medical History:  Diagnosis Date  . ADHD    As a child  . Anxiety   . Bipolar 1 disorder (HCC)   . OCD (obsessive compulsive disorder)   . PTSD (post-traumatic stress disorder)   . Pyloric stenosis     Patient Active Problem List   Diagnosis Date Noted  . Severe anxiety 03/19/2018  . Moderate major depression (HCC) 03/19/2018  . Bipolar disorder, unspecified (HCC) 02/27/2018  . Posttraumatic stress disorder   . Cannabis dependence (HCC)   . PTSD (post-traumatic stress disorder) 12/31/2017  . Bipolar I disorder, current or most recent episode manic, with psychotic features (HCC) 12/31/2017  . Anxiety 12/31/2017  . OCD (obsessive compulsive disorder) 12/31/2017  . ADHD 12/31/2017  . Prediabetes 12/31/2017    Past Surgical History:  Procedure Laterality Date  . APPENDECTOMY  02/17/2014    Prior to Admission medications   Medication Sig Start  Date End Date Taking? Authorizing Provider  Ascorbic Acid (VITAMIN C) 1000 MG tablet Take 1,000 mg by mouth daily.    [provider]  hydrOXYzine (ATARAX/VISTARIL) 50 MG tablet Take 1 tablet (50 mg total) by mouth daily with breakfast AND 2 tablets (100 mg total) at bedtime. 06/26/18 07/26/18  [name] Bucy, MD  lamoTRIgine (LAMICTAL) 100 MG tablet Take 2 tablets (200 mg total) by mouth daily. 06/26/18 07/26/18  [name] Bucy, MD  Multiple Vitamin (MULTIVITAMIN) tablet Take 1 tablet by mouth daily.    [provider]  Omega-3 Fatty Acids (FISH OIL) 500 MG CAPS Take 1 capsule by mouth daily.    [provider]  pantoprazole (PROTONIX) 40 MG tablet Take 1 tablet (40 mg total) by mouth daily. 03/03/18   Denzil Magnuson, NP  Psyllium 55.46 % POWD Take 1 Scoop by mouth daily. 01/07/18   Pasty Spillers, MD  risperiDONE (RISPERDAL) 2 MG tablet Take 2 tablets (4 mg total) by mouth at bedtime. 06/26/18 07/26/18  [name] Bucy, MD  traZODone (DESYREL) 50 MG tablet Take 1 tablet (50 mg total) by mouth at bedtime as needed for sleep. 03/02/18   Denzil Magnuson, NP  Vitamin E 400 units TABS Take by mouth daily.    [provider]    Allergies Concerta [methylphenidate hcl er (cd)] and Vyvanse [lisdexamfetamine]  Family History  Problem Relation Age of Onset  . COPD Mother   . Hypertension Mother   . Asthma Mother   . Arthritis Mother   .  Pancreatic cancer Mother        slow growing  . ADD / ADHD Mother   . Other Mother        Spinal Stenosis - currently in surgery today  . Bipolar disorder Father   . Arthritis Father   . Hypercholesterolemia Father   . Asthma Brother   . Hernia Brother   . ADD / ADHD Brother   . Hypertension Maternal Grandmother   . Heart disease Maternal Grandmother 71  . Rheumatic fever Maternal Grandmother   . Heart attack Maternal Grandmother        Bypass Surgery  . Pancreatic cancer Maternal Grandfather   .  Liver cancer Maternal Grandfather   . Obesity Paternal Grandmother     Social History Social History   Tobacco Use  . Smoking status: Former Smoker    Packs/day: 0.25    Years: 10.00    Pack years: 2.50    Types: Cigarettes    Start date: 01/01/2008  . Smokeless tobacco: Never Used  Substance Use Topics  . Alcohol use: Yes    Comment: occasionally drink a beer  . Drug use: Yes    Types: Marijuana    Comment: cocaine in the past but not currently     Review of Systems  Constitutional: No fever. Eyes: No visual changes. ENT: No sore throat. Cardiovascular: Denies chest pain. Respiratory: Denies shortness of breath. Gastrointestinal: No vomiting.  Genitourinary: Negative for dysuria.  Musculoskeletal: Negative for back pain. Skin: Negative for rash. Neurological: Negative for headache.   ____________________________________________   PHYSICAL EXAM:  VITAL SIGNS: ED Triage Vitals  Enc Vitals Group     BP 06/26/18 0933 120/74     Pulse Rate 06/26/18 0933 85     Resp 06/26/18 0933 18     Temp 06/26/18 0933 97.8 F (36.6 C)     Temp Source 06/26/18 0933 Oral     SpO2 06/26/18 0933 98 %     Weight 06/26/18 0934 175 lb (79.4 kg)     Height 06/26/18 0934 4\' 11"  (1.499 m)     Head Circumference --      Peak Flow --      Pain Score 06/26/18 0933 5     Pain Loc --      Pain Edu? --      Excl. in GC? --     Constitutional: Alert and oriented. Well appearing and in no acute distress. Eyes: Conjunctivae are normal.  EOMI.  PERRLA. Head: Atraumatic. Nose: No congestion/rhinnorhea. Mouth/Throat: Mucous membranes are moist.   Neck: Normal range of motion.  Cardiovascular: Good peripheral circulation. Respiratory: Normal respiratory effort.  Gastrointestinal: No distention.  Musculoskeletal: Extremities warm and well perfused.  Neurologic:  Normal speech and language.  Motor and sensory intact in all extremities.  Cranial nerves II through XII intact.  Normal  coordination.  No gross focal neurologic deficits are appreciated.  Skin:  Skin is warm and dry. No rash noted. Psychiatric: Mood and affect are normal.  No SI or HI.  Speech and behavior are normal.  ____________________________________________   LABS (all labs ordered are listed, but only abnormal results are displayed)  Labs Reviewed - No data to display ____________________________________________  EKG   ____________________________________________  RADIOLOGY    ____________________________________________   PROCEDURES  Procedure(s) performed: No  Procedures  Critical Care performed: No ____________________________________________   INITIAL IMPRESSION / ASSESSMENT AND PLAN / ED COURSE  Pertinent labs & imaging results that were available during  my care of the patient were reviewed by me and considered in my medical decision making (see chart for details).  27 year old female with PMH as noted above presents after having just returned to the area of the [place] and states that she was just released from prison.  She has primarily come to request medication with prescriptions since she just ran out of her medications, but also reports a head injury a few months ago and wanted to find out about general testing for disease that she could have been exposed to while in jail.  1.  Prescriptions: I have provided prescriptions for a one-month supply of the patient's Vistaril, Lamictal, and Risperdal.  She reports that while she was in jail the Risperdal was increased to 4 mg so this is what I prescribed.  She has plans to follow-up with a primary care doctor and a behavioral health provider now that she has returned to the area.  2.  Head injury: The patient has a normal neurologic exam.  I advised that her that her symptoms are consistent with a mild concussion, but given the time interval there is no indication for imaging or further ED work-up.  She can follow-up with her  regular doctor.  3.  Disease screening: I advised patient that screening for TB at her regular doctor with a PPD would be appropriate  The patient currently denies SI or HI and has no acute psychiatric symptoms.  She is calm and cooperative.  I answered all of her questions.  Return precautions given, and she expresses understanding. ____________________________________________   FINAL CLINICAL IMPRESSION(S) / ED DIAGNOSES  Final diagnoses:  Encounter for medication refill      NEW MEDICATIONS STARTED DURING THIS VISIT:  Current Discharge Medication List       Note:  This document was prepared using Dragon voice recognition software and may include unintentional dictation errors.    [name] Bucy, MD 06/26/18 1043

## 2018-07-20 ENCOUNTER — Encounter: Payer: Self-pay | Admitting: Family Medicine

## 2018-07-20 ENCOUNTER — Ambulatory Visit: Payer: Medicaid Other | Admitting: Family Medicine

## 2018-07-20 VITALS — BP 96/70 | HR 70 | Resp 16 | Ht 60.0 in | Wt 175.0 lb

## 2018-07-20 DIAGNOSIS — F319 Bipolar disorder, unspecified: Secondary | ICD-10-CM

## 2018-07-20 DIAGNOSIS — R202 Paresthesia of skin: Secondary | ICD-10-CM

## 2018-07-20 DIAGNOSIS — Z79899 Other long term (current) drug therapy: Secondary | ICD-10-CM

## 2018-07-20 DIAGNOSIS — M797 Fibromyalgia: Secondary | ICD-10-CM

## 2018-07-20 DIAGNOSIS — Z23 Encounter for immunization: Secondary | ICD-10-CM | POA: Diagnosis not present

## 2018-07-20 DIAGNOSIS — K760 Fatty (change of) liver, not elsewhere classified: Secondary | ICD-10-CM

## 2018-07-20 DIAGNOSIS — R7303 Prediabetes: Secondary | ICD-10-CM | POA: Diagnosis not present

## 2018-07-20 NOTE — Progress Notes (Signed)
Name: Courtney [name]   MRN: [id number]    DOB: October 03, 1990   Date:07/21/2018       Progress Note  Subjective  Chief Complaint  Chief Complaint  Patient presents with  . Numbness  . Abdominal Pain    HPI  Obesity: weight started to creep up at age 27, max weight of 237 lbs in 2015, lost while homeless. January 2017 down to 157 lbs, she has been eating healthier, avoiding sodas, drinking water, going to the gym, and states home scale showed a 10 lbs weight loss.   H. Pylori: seen by GI , treated for 2 weeks but still has symptoms of epigastric pain, some anal pain and also some right lower quadrant pain, no longer on pantoprazole, she has follow up with GI tomorrow.   Bipolar disorder: she was seeing psychiatrist at [name] , Dr. Arlester Marker, but is currently going to local Judee Clara NP for medications and a therapist at [facility]. She spent a lot of time talking about the way she was arrested, also about [employer], how mistreated she was in the past. She states current regiment is working well for her. She went to [place] to live with friend after argument with her father but he is back and getting along better now  FMS: she used to take gabapentin inconsistently and stopped, recently noticing more pain, tingling and burning all over her body including her scalp, numbness worse on hands and feet, she has some myoclonus at times, we will check B12 and consider EMG  Pre-diabetes: we will recheck labs, denies polyphagia, polyuria or polydipsia.   Patient Active Problem List   Diagnosis Date Noted  . Severe anxiety 03/19/2018  . Moderate major depression (HCC) 03/19/2018  . Bipolar disorder, unspecified (HCC) 02/27/2018  . Posttraumatic stress disorder   . Cannabis dependence (HCC)   . PTSD (post-traumatic stress disorder) 12/31/2017  . Bipolar I disorder, current or most recent episode manic, with psychotic features (HCC) 12/31/2017  . Anxiety 12/31/2017  . OCD  (obsessive compulsive disorder) 12/31/2017  . ADHD 12/31/2017  . Prediabetes 12/31/2017    Past Surgical History:  Procedure Laterality Date  . APPENDECTOMY  02/17/2014    Family History  Problem Relation Age of Onset  . COPD Mother   . Hypertension Mother   . Asthma Mother   . Arthritis Mother   . Pancreatic cancer Mother        slow growing  . ADD / ADHD Mother   . Other Mother        Spinal Stenosis - currently in surgery today  . Bipolar disorder Father   . Arthritis Father   . Hypercholesterolemia Father   . Asthma Brother   . Hernia Brother   . ADD / ADHD Brother   . Hypertension Maternal Grandmother   . Heart disease Maternal Grandmother 38  . Rheumatic fever Maternal Grandmother   . Heart attack Maternal Grandmother        Bypass Surgery  . Pancreatic cancer Maternal Grandfather   . Liver cancer Maternal Grandfather   . Obesity Paternal Grandmother     Social History   Socioeconomic History  . Marital status: Single    Spouse name: Not on file  . Number of children: 0  . Years of education: Not on file  . Highest education level: Not on file  Occupational History  . Occupation: disability     Comment: mental health   Social Needs  . Physicist, medical  strain: Somewhat hard  . Food insecurity:    Worry: Never true    Inability: Never true  . Transportation needs:    Medical: No    Non-medical: No  Tobacco Use  . Smoking status: Former Smoker    Packs/day: 0.25    Years: 10.00    Pack years: 2.50    Types: Cigarettes    Start date: 01/01/2008  . Smokeless tobacco: Never Used  Substance and Sexual Activity  . Alcohol use: Yes    Comment: occasionally drink a beer  . Drug use: Yes    Types: Marijuana    Comment: cocaine in the past but not currently   . Sexual activity: Yes    Partners: Male    Birth control/protection: Condom  Lifestyle  . Physical activity:    Days per week: 5 days    Minutes per session: 60 min  . Stress: Rather much   Relationships  . Social connections:    Talks on phone: More than three times a week    Gets together: Three times a week    Attends religious service: More than 4 times per year    Active member of club or organization: Yes    Attends meetings of clubs or organizations: More than 4 times per year    Relationship status: Never married  . Intimate partner violence:    Fear of current or ex partner: No    Emotionally abused: No    Physically abused: No    Forced sexual activity: No  Other Topics Concern  . Not on file  Social History Narrative   Moved to [place] from [facility] to be closer to family. Parents moved here in 2018.   On disability for bipolar disorder since young age, had to be placed in a group home and foster home because of behavior. She is now living with parents since she decided to take her medications.      Current Outpatient Medications:  .  hydrOXYzine (ATARAX/VISTARIL) 50 MG tablet, Take 1 tablet (50 mg total) by mouth daily with breakfast AND 2 tablets (100 mg total) at bedtime., Disp: 90 tablet, Rfl: 0 .  lamoTRIgine (LAMICTAL) 100 MG tablet, Take 2 tablets (200 mg total) by mouth daily., Disp: 60 tablet, Rfl: 0 .  Multiple Vitamin (MULTIVITAMIN) tablet, Take 1 tablet by mouth daily., Disp: , Rfl:  .  Psyllium 55.46 % POWD, Take 1 Scoop by mouth daily., Disp: 1 Bottle, Rfl: 1 .  risperiDONE (RISPERDAL) 2 MG tablet, Take 2 tablets (4 mg total) by mouth at bedtime., Disp: 60 tablet, Rfl: 0 .  ARIPiprazole (ABILIFY) 20 MG tablet, Take 1 tablet by mouth at bedtime., Disp: , Rfl: 0 .  CONCERTA 36 MG CR tablet, Take 1 tablet by mouth daily., Disp: , Rfl: 0  Allergies  Allergen Reactions  . Vyvanse [Lisdexamfetamine] Other (See Comments)    Bounce off of the wall    I personally reviewed active problem list, medication list, allergies, family history, social history with the patient/caregiver today.   ROS  Constitutional: Negative for fever, positive for  weight change.   Respiratory: Negative for cough and shortness of breath.   Cardiovascular: Negative for chest pain or palpitations.  Gastrointestinal: Negative for abdominal pain, no bowel changes.  Musculoskeletal: Negative for gait problem or joint swelling.  Skin: Negative for rash.  Neurological: Negative for dizziness or headache.  No other specific complaints in a complete review of systems (except as listed in HPI  above).  Objective  Vitals:   07/20/18 1412  BP: 96/70  Pulse: 70  Resp: 16  SpO2: 97%  Weight: 175 lb (79.4 kg)  Height: 5' (1.524 m)     Body mass index is 34.18 kg/m.  Physical Exam  Constitutional: Patient appears well-developed and well-nourished. Obese  No distress.  HEENT: head atraumatic, normocephalic, pupils equal and reactive to light, neck supple, throat within normal limits Cardiovascular: Normal rate, regular rhythm and normal heart sounds.  No murmur heard. No BLE edema. Pulmonary/Chest: Effort normal and breath sounds normal. No respiratory distress. Abdominal: Soft.  There is no tenderness. Psychiatric: Patient has a normal mood and affect. behavior is normal. Judgment and thought content normal. Neurological: she has paresthesia on hands and feet  PHQ2/9: Depression screen [facility] 2/9 07/20/2018 02/24/2018 12/31/2017  Decreased Interest 2 0 1  Down, Depressed, Hopeless 1 1 0  PHQ - 2 Score 3 1 1   Altered sleeping 0 3 3  Tired, decreased energy 2 0 1  Change in appetite 0 3 2  Feeling bad or failure about yourself  3 1 3   Trouble concentrating 1 2 3   Moving slowly or fidgety/restless 1 3 3   Suicidal thoughts 0 3 2  PHQ-9 Score 10 8 18   Difficult doing work/chores Very difficult Extremely dIfficult Extremely dIfficult     Fall Risk: Fall Risk  07/20/2018 12/31/2017  Falls in the past year? 0 No      Assessment & Plan  1. Paresthesia  - B12 and Folate Panel  2. Need for Tdap vaccination  - Tdap vaccine greater than or equal to 7yo IM  3.  Need for HPV vaccine  - HPV 9-valent vaccine,Recombinat  4. Bipolar 1 disorder (HCC)  Under the care of psychiatrist   5. Prediabetes  - Hemoglobin A1c  6. Fibromyalgia  She has taken gabapentin in the past, still has daily pain   7. Fatty liver disease, nonalcoholic  Recheck labs  8. Long-term use of high-risk medication  - COMPLETE METABOLIC PANEL WITH GFR

## 2018-07-21 ENCOUNTER — Ambulatory Visit (INDEPENDENT_AMBULATORY_CARE_PROVIDER_SITE_OTHER): Payer: Medicaid Other | Admitting: Gastroenterology

## 2018-07-21 ENCOUNTER — Encounter: Payer: Self-pay | Admitting: Gastroenterology

## 2018-07-21 VITALS — BP 108/69 | HR 86 | Ht 60.0 in | Wt 175.4 lb

## 2018-07-21 DIAGNOSIS — K760 Fatty (change of) liver, not elsewhere classified: Secondary | ICD-10-CM | POA: Diagnosis not present

## 2018-07-21 DIAGNOSIS — A048 Other specified bacterial intestinal infections: Secondary | ICD-10-CM | POA: Diagnosis not present

## 2018-07-21 LAB — COMPLETE METABOLIC PANEL WITH GFR
AG Ratio: 1.4 (calc) (ref 1.0–2.5)
ALT: 12 U/L (ref 6–29)
AST: 14 U/L (ref 10–30)
Albumin: 4.1 g/dL (ref 3.6–5.1)
Alkaline phosphatase (APISO): 49 U/L (ref 33–115)
BUN: 16 mg/dL (ref 7–25)
CO2: 29 mmol/L (ref 20–32)
Calcium: 9.7 mg/dL (ref 8.6–10.2)
Chloride: 102 mmol/L (ref 98–110)
Creat: 0.77 mg/dL (ref 0.50–1.10)
GFR, Est African American: 124 mL/min/{1.73_m2} (ref >=60)
GFR, Est Non African American: 107 mL/min/{1.73_m2} (ref >=60)
Globulin: 3 g/dL (calc) (ref 1.9–3.7)
Glucose, Bld: 88 mg/dL (ref 65–99)
Potassium: 4.7 mmol/L (ref 3.5–5.3)
Sodium: 140 mmol/L (ref 135–146)
Total Bilirubin: 0.3 mg/dL (ref 0.2–1.2)
Total Protein: 7.1 g/dL (ref 6.1–8.1)

## 2018-07-21 LAB — B12 AND FOLATE PANEL
Folate: 15.2 ng/mL
Vitamin B-12: 547 pg/mL (ref 200–1100)

## 2018-07-21 LAB — HEMOGLOBIN A1C
Hgb A1c MFr Bld: 5.5 % of total Hgb (ref <5.7)
Mean Plasma Glucose: 111 (calc)
eAG (mmol/L): 6.2 (calc)

## 2018-07-21 NOTE — Progress Notes (Signed)
[name], MD [address]  [address]  [place], [place] [address]  Main: [phone]  Fax: [phone]   Primary Care Physician: [name], MD  Primary Gastroenterologist:  Dr. [name]  Chief Complaint  Patient presents with  . Follow-up    rectum pain, irregular stool    HPI: Courtney [name] is a 27 y.o. female here for follow-up of fatty liver, H. pylori, abdominal pain.  Patient was treated with triple therapy and has completed therapy but continues to report abdominal pain.  Also reports rectal pain and bright red blood per rectum that she attributes to hemorrhoids.  Labs otherwise reassuring with normal hemoglobin, platelets, liver function, liver enzymes.  Underwent liver ultrasound in May 2019 that showed fatty liver.  Reports chronic history of abdominal pain, diffuse, crampy, 410, intermittent, unrelated to meals.  No dysphagia.  She states she was scheduled for an EGD and colonoscopy in 2016 due to abdominal pain but as she could not tolerate the colonoscopy prep her procedures were canceled.  Has history of appendectomy.  Current Outpatient Medications  Medication Sig Dispense Refill  . ARIPiprazole (ABILIFY) 20 MG tablet Take 1 tablet by mouth at bedtime.  0  . CONCERTA 36 MG CR tablet Take 1 tablet by mouth daily.  0  . hydrOXYzine (ATARAX/VISTARIL) 50 MG tablet Take 1 tablet (50 mg total) by mouth daily with breakfast AND 2 tablets (100 mg total) at bedtime. 90 tablet 0  . lamoTRIgine (LAMICTAL) 100 MG tablet Take 2 tablets (200 mg total) by mouth daily. 60 tablet 0  . Multiple Vitamin (MULTIVITAMIN) tablet Take 1 tablet by mouth daily.    . Psyllium 55.46 % POWD Take 1 Scoop by mouth daily. 1 Bottle 1  . risperiDONE (RISPERDAL) 2 MG tablet Take 2 tablets (4 mg total) by mouth at bedtime. 60 tablet 0   No current facility-administered medications for this visit.     Allergies as of 07/21/2018 - Review Complete 07/21/2018    Allergen Reaction Noted  . Vyvanse [lisdexamfetamine] Other (See Comments) 07/10/2017    ROS:  General: Negative for anorexia, weight loss, fever, chills, fatigue, weakness. ENT: Negative for hoarseness, difficulty swallowing , nasal congestion. CV: Negative for chest pain, angina, palpitations, dyspnea on exertion, peripheral edema.  Respiratory: Negative for dyspnea at rest, dyspnea on exertion, cough, sputum, wheezing.  GI: See history of present illness. GU:  Negative for dysuria, hematuria, urinary incontinence, urinary frequency, nocturnal urination.  Endo: Negative for unusual weight change.    Physical Examination:   BP 108/69   Pulse 86   Ht 5' (1.524 m)   Wt 175 lb 6.4 oz (79.6 kg)   BMI 34.26 kg/m   General: Well-nourished, well-developed in no acute distress.  Eyes: No icterus. Conjunctivae pink. Mouth: Oropharyngeal mucosa moist and pink , no lesions erythema or exudate. Neck: Supple, Trachea midline Abdomen: Bowel sounds are normal, nontender, nondistended, no hepatosplenomegaly or masses, no abdominal bruits or hernia , no rebound or guarding.   Extremities: No lower extremity edema. No clubbing or deformities. Neuro: Alert and oriented x 3.  Grossly intact. Skin: Warm and dry, no jaundice.   Psych: Alert and cooperative, normal mood and affect.   Labs: CMP     Component Value Date/Time   NA 140 07/20/2018 1433   K 4.7 07/20/2018 1433   CL 102 07/20/2018 1433   CO2 29 07/20/2018 1433   GLUCOSE 88 07/20/2018 1433   BUN 16 07/20/2018 1433   CREATININE  0.77 07/20/2018 1433   CALCIUM 9.7 07/20/2018 1433   PROT 7.1 07/20/2018 1433   ALBUMIN 3.9 02/27/2018 0140   AST 14 07/20/2018 1433   ALT 12 07/20/2018 1433   ALKPHOS 45 02/27/2018 0140   BILITOT 0.3 07/20/2018 1433   GFRNONAA 107 07/20/2018 1433   GFRAA 124 07/20/2018 1433   Lab Results  Component Value Date   WBC 6.4 02/27/2018   HGB 12.1 02/27/2018   HCT 37.3 02/27/2018   MCV 85.6 02/27/2018    PLT 257 02/27/2018    Imaging Studies: No results found.  Assessment and Plan:   Courtney [name] is a 27 y.o. y/o female with abdominal pain despite H. pylori treatment here for follow-up  An EGD and colonoscopy was supposedly scheduled in the past in [place] but patient was unable to tolerate the prep at that time With availability of Plenvu and Suprep, which are much less volume patient would like to attempt colonoscopy given her ongoing rectal pain and possible bleeding from hemorrhoids Her labs are otherwise reassuring  EGD would also allow [name] to rule out any peptic ulcer disease given her ongoing abdominal pain despite H. pylori treatment  I have discussed alternative options, risks & benefits,  which include, but are not limited to, bleeding, infection, perforation,respiratory complication & drug reaction.  The patient agrees with this plan & written consent will be obtained.    She is unsure if she received hepatitis B vaccine in the past We will check for immunity and if not present she should get hepatitis a and B vaccination due to her history of fatty liver Finding of fatty liver on imaging discussed with patient Diet, weight loss, and exercise encouraged along with avoiding hepatotoxic drugs including alcohol Risk of progression to cirrhosis if above measures are not instituted were discussed as well, and patient verbalized understanding    Dr [name]

## 2018-07-22 ENCOUNTER — Other Ambulatory Visit: Payer: Self-pay

## 2018-07-22 LAB — HEPATITIS A ANTIBODY, TOTAL: Hep A Total Ab: NEGATIVE

## 2018-07-22 LAB — HEPATITIS B SURFACE ANTIGEN: Hepatitis B Surface Ag: NEGATIVE

## 2018-07-22 LAB — HCV COMMENT:

## 2018-07-22 LAB — HEPATITIS C ANTIBODY (REFLEX): HCV Ab: <0.1 s/co ratio (ref 0.0–0.9)

## 2018-07-22 LAB — HEPATITIS B CORE ANTIBODY, TOTAL: Hep B Core Total Ab: NEGATIVE

## 2018-07-22 LAB — HEPATITIS B SURFACE ANTIBODY,QUALITATIVE: Hep B Surface Ab, Qual: NONREACTIVE

## 2018-07-30 ENCOUNTER — Other Ambulatory Visit: Payer: Self-pay

## 2018-07-30 ENCOUNTER — Telehealth: Payer: Self-pay | Admitting: Gastroenterology

## 2018-07-30 DIAGNOSIS — K6289 Other specified diseases of anus and rectum: Secondary | ICD-10-CM

## 2018-07-30 DIAGNOSIS — R1084 Generalized abdominal pain: Secondary | ICD-10-CM

## 2018-07-30 NOTE — Telephone Encounter (Signed)
Pt is calling to schedule a endoscopy and colonoscopy please call pt

## 2018-07-30 NOTE — Telephone Encounter (Signed)
I spoke with pt and EGD/Colonoscopy scheduled for 09/03/2018 at [facility]. Prep instructions given on visit.

## 2018-08-16 ENCOUNTER — Encounter: Payer: Self-pay | Admitting: Family Medicine

## 2018-08-20 ENCOUNTER — Ambulatory Visit: Payer: Medicaid Other | Admitting: Family Medicine

## 2018-08-26 ENCOUNTER — Ambulatory Visit: Payer: Medicaid Other | Admitting: Family Medicine

## 2018-08-26 ENCOUNTER — Ambulatory Visit
Admission: RE | Admit: 2018-08-26 | Discharge: 2018-08-26 | Disposition: A | Discharge status: Home / Self Care | Payer: Medicaid Other | Attending: Family Medicine | Admitting: Family Medicine

## 2018-08-26 ENCOUNTER — Ambulatory Visit
Admission: RE | Admit: 2018-08-26 | Discharge: 2018-08-26 | Disposition: A | Discharge status: Home / Self Care | Payer: Medicaid Other | Source: Ambulatory Visit | Attending: Family Medicine | Admitting: Family Medicine

## 2018-08-26 ENCOUNTER — Encounter: Payer: Self-pay | Admitting: Family Medicine

## 2018-08-26 VITALS — BP 110/70 | HR 100 | Temp 98.0°F | Resp 16 | Ht 60.0 in | Wt 170.7 lb

## 2018-08-26 DIAGNOSIS — R05 Cough: Secondary | ICD-10-CM | POA: Diagnosis present

## 2018-08-26 DIAGNOSIS — M797 Fibromyalgia: Secondary | ICD-10-CM

## 2018-08-26 DIAGNOSIS — R0602 Shortness of breath: Secondary | ICD-10-CM | POA: Insufficient documentation

## 2018-08-26 DIAGNOSIS — R059 Cough, unspecified: Secondary | ICD-10-CM

## 2018-08-26 DIAGNOSIS — R202 Paresthesia of skin: Secondary | ICD-10-CM | POA: Diagnosis not present

## 2018-08-26 MED ORDER — BUDESONIDE-FORMOTEROL FUMARATE 80-4.5 MCG/ACT IN AERO: 2.0000 | INHALATION_SPRAY | Freq: Two times a day (BID) | RESPIRATORY_TRACT | 3 refills | Status: DC

## 2018-08-26 NOTE — Progress Notes (Signed)
Name: Courtney [name]   MRN: [id number]    DOB: [birth year]   Date:08/26/2018       Progress Note  Subjective  Chief Complaint  Chief Complaint  Patient presents with  . Follow-up    lab work  . Cough    for 3 weeks    HPI  Pt presents for follow up - she is new to me today.  FMS: she used to take gabapentin inconsistently and stopped,recently noticing more pain, tingling and burning all over her body including her scalp, numbness worse on hands and feet, she has some myoclonus at times.  PCP checked Labs in December 2019 - normal B12, folate, A1C, CMP.  Per notes from PCP Dr. Carlynn Purl - suggested referral to neurology which we will provide today.  Did recommend that she consider pain management referral in the future if Neurology unable to determine cause of pain.  Cough: Ongoing for about 3 weeks - congested/productive cough.  Started out as a cold, and cough never went away completely.  Denies fevers/chills, no abnormal body aches, no hemoptysis, she is not smoking, no shortness of breath, sore throat, chest pain.  Discussed prednisone, she wants to avoid due to risk of mood issues.  Patient Active Problem List   Diagnosis Date Noted  . Severe anxiety 03/19/2018  . Moderate major depression (HCC) 03/19/2018  . Bipolar disorder, unspecified (HCC) 02/27/2018  . Posttraumatic stress disorder   . Cannabis dependence (HCC)   . PTSD (post-traumatic stress disorder) 12/31/2017  . Bipolar I disorder, current or most recent episode manic, with psychotic features (HCC) 12/31/2017  . Anxiety 12/31/2017  . OCD (obsessive compulsive disorder) 12/31/2017  . ADHD 12/31/2017  . Prediabetes 12/31/2017    Past Surgical History:  Procedure Laterality Date  . APPENDECTOMY  02/17/2014    Family History  Problem Relation Age of Onset  . COPD Mother   . Hypertension Mother   . Asthma Mother   . Arthritis Mother   . Pancreatic cancer Mother        slow growing  . ADD / ADHD Mother   .  Other Mother        Spinal Stenosis - currently in surgery today  . Bipolar disorder Father   . Arthritis Father   . Hypercholesterolemia Father   . Asthma Brother   . Hernia Brother   . ADD / ADHD Brother   . Hypertension Maternal Grandmother   . Heart disease Maternal Grandmother 58  . Rheumatic fever Maternal Grandmother   . Heart attack Maternal Grandmother        Bypass Surgery  . Pancreatic cancer Maternal Grandfather   . Liver cancer Maternal Grandfather   . Obesity Paternal Grandmother     Social History   Socioeconomic History  . Marital status: Single    Spouse name: Not on file  . Number of children: 0  . Years of education: Not on file  . Highest education level: Not on file  Occupational History  . Occupation: disability     Comment: mental health   Social Needs  . Financial resource strain: Somewhat hard  . Food insecurity:    Worry: Never true    Inability: Never true  . Transportation needs:    Medical: No    Non-medical: No  Tobacco Use  . Smoking status: Former Smoker    Packs/day: 0.25    Years: 10.00    Pack years: 2.50    Types: Cigarettes  Start date: 01/01/2008  . Smokeless tobacco: Never Used  Substance and Sexual Activity  . Alcohol use: Yes    Comment: occasionally drink a beer  . Drug use: Not Currently    Types: Marijuana    Comment: cocaine in the past but not currently   . Sexual activity: Yes    Partners: Male    Birth control/protection: Condom  Lifestyle  . Physical activity:    Days per week: 5 days    Minutes per session: 60 min  . Stress: Rather much  Relationships  . Social connections:    Talks on phone: More than three times a week    Gets together: Three times a week    Attends religious service: More than 4 times per year    Active member of club or organization: Yes    Attends meetings of clubs or organizations: More than 4 times per year    Relationship status: Never married  . Intimate partner violence:     Fear of current or ex partner: No    Emotionally abused: No    Physically abused: No    Forced sexual activity: No  Other Topics Concern  . Not on file  Social History Narrative   Moved to [place] from [facility] to be closer to family. Parents moved here in 2018.   On disability for bipolar disorder since young age, had to be placed in a group home and foster home because of behavior. She is now living with parents since she decided to take her medications.      Current Outpatient Medications:  .  ARIPiprazole (ABILIFY) 20 MG tablet, Take 1 tablet by mouth at bedtime., Disp: , Rfl: 0 .  CONCERTA 36 MG CR tablet, Take 1 tablet by mouth daily., Disp: , Rfl: 0 .  lamoTRIgine (LAMICTAL) 200 MG tablet, Take 200 mg by mouth daily., Disp: , Rfl:  .  Multiple Vitamin (MULTIVITAMIN) tablet, Take 1 tablet by mouth daily., Disp: , Rfl:  .  Psyllium 55.46 % POWD, Take 1 Scoop by mouth daily., Disp: 1 Bottle, Rfl: 1 .  risperiDONE (RISPERDAL) 2 MG tablet, Take 2 tablets (4 mg total) by mouth at bedtime., Disp: 60 tablet, Rfl: 0 .  lamoTRIgine (LAMICTAL) 100 MG tablet, Take 2 tablets (200 mg total) by mouth daily., Disp: 60 tablet, Rfl: 0  Allergies  Allergen Reactions  . Vyvanse [Lisdexamfetamine] Other (See Comments)    Bounce off of the wall    I personally reviewed active problem list, medication list, allergies, notes from last encounter, lab results with the patient/caregiver today.   ROS  Ten systems reviewed and is negative except as mentioned in HPI.  Objective  Vitals:   08/26/18 1326  BP: 110/70  Pulse: 100  Resp: 16  Temp: 98 F (36.7 C)  TempSrc: Oral  SpO2: 96%  Weight: 170 lb 11.2 oz (77.4 kg)  Height: 5' (1.524 m)   Body mass index is 33.34 kg/m.  Physical Exam Constitutional: Patient appears well-developed and well-nourished. No distress.  HENT: Head: Normocephalic and atraumatic. Neck: Normal range of motion. Neck supple. Cardiovascular: Normal rate, regular rhythm and  normal heart sounds.  No murmur heard. No BLE edema. Pulmonary/Chest: Effort normal and breath sounds normal. No respiratory distress. Musculoskeletal: Normal range of motion, no joint effusions. No gross deformities Neurological: Pt is alert and oriented to person, place, and time. No cranial nerve deficit. Coordination, balance, strength, speech and gait are normal.  Skin: Skin is warm  and dry. No rash noted. No erythema.  Psychiatric: Patient has a normal mood and affect. behavior is normal. Judgment and thought content normal.  No results found for this or any previous visit (from the past 72 hour(s)).  PHQ2/9: Depression screen [facility] 2/9 08/26/2018 07/20/2018 02/24/2018 12/31/2017  Decreased Interest 1 2 0 1  Down, Depressed, Hopeless 1 1 1  0  PHQ - 2 Score 2 3 1 1   Altered sleeping 2 0 3 3  Tired, decreased energy 1 2 0 1  Change in appetite 0 0 3 2  Feeling bad or failure about yourself  - 3 1 3   Trouble concentrating 2 1 2 3   Moving slowly or fidgety/restless 0 1 3 3   Suicidal thoughts 0 0 3 2  PHQ-9 Score 7 10 8 18   Difficult doing work/chores Not difficult at all Very difficult Extremely dIfficult Extremely dIfficult   Fall Risk: Fall Risk  08/26/2018 07/20/2018 12/31/2017  Falls in the past year? 0 0 No  Number falls in past yr: 0 - -  Injury with Fall? 0 - -  Follow up Falls evaluation completed - -   Assessment & Plan  1. Paresthesia - Ambulatory referral to Neurology  2. Fibromyalgia - Ambulatory referral to Neurology  3. Cough - budesonide-formoterol (SYMBICORT) 80-4.5 MCG/ACT inhaler; Inhale 2 puffs into the lungs 2 (two) times daily.  Dispense: 1 Inhaler; Refill: 3 - DG Chest 2 View; Future  4. Shortness of breath - budesonide-formoterol (SYMBICORT) 80-4.5 MCG/ACT inhaler; Inhale 2 puffs into the lungs 2 (two) times daily.  Dispense: 1 Inhaler; Refill: 3 - DG Chest 2 View; Future

## 2018-08-26 NOTE — Addendum Note (Signed)
Addended by: Rihan Schueler, Sherrill Raring on: 08/26/2018 02:09 PM   Modules accepted: Orders

## 2018-09-03 ENCOUNTER — Ambulatory Visit: Payer: Medicaid Other | Admitting: Anesthesiology

## 2018-09-03 ENCOUNTER — Encounter: Payer: Self-pay | Admitting: *Deleted

## 2018-09-03 ENCOUNTER — Ambulatory Visit
Admission: RE | Admit: 2018-09-03 | Discharge: 2018-09-03 | Disposition: A | Discharge status: Home / Self Care | Payer: Medicaid Other | Attending: Gastroenterology | Admitting: Gastroenterology

## 2018-09-03 ENCOUNTER — Encounter
Admission: RE | Disposition: A | Discharge status: Home / Self Care | Payer: Self-pay | Source: Home / Self Care | Attending: Gastroenterology

## 2018-09-03 ENCOUNTER — Telehealth: Payer: Self-pay | Admitting: Gastroenterology

## 2018-09-03 DIAGNOSIS — Z87891 Personal history of nicotine dependence: Secondary | ICD-10-CM | POA: Insufficient documentation

## 2018-09-03 DIAGNOSIS — Q453 Other congenital malformations of pancreas and pancreatic duct: Secondary | ICD-10-CM | POA: Diagnosis not present

## 2018-09-03 DIAGNOSIS — F4310 Post-traumatic stress disorder, unspecified: Secondary | ICD-10-CM | POA: Insufficient documentation

## 2018-09-03 DIAGNOSIS — F429 Obsessive-compulsive disorder, unspecified: Secondary | ICD-10-CM | POA: Diagnosis not present

## 2018-09-03 DIAGNOSIS — Z79899 Other long term (current) drug therapy: Secondary | ICD-10-CM | POA: Diagnosis not present

## 2018-09-03 DIAGNOSIS — F319 Bipolar disorder, unspecified: Secondary | ICD-10-CM | POA: Diagnosis not present

## 2018-09-03 DIAGNOSIS — K621 Rectal polyp: Secondary | ICD-10-CM | POA: Diagnosis not present

## 2018-09-03 DIAGNOSIS — R1013 Epigastric pain: Secondary | ICD-10-CM | POA: Diagnosis present

## 2018-09-03 DIAGNOSIS — K2950 Unspecified chronic gastritis without bleeding: Secondary | ICD-10-CM | POA: Insufficient documentation

## 2018-09-03 DIAGNOSIS — K228 Other specified diseases of esophagus: Secondary | ICD-10-CM

## 2018-09-03 DIAGNOSIS — K921 Melena: Secondary | ICD-10-CM | POA: Diagnosis not present

## 2018-09-03 DIAGNOSIS — F419 Anxiety disorder, unspecified: Secondary | ICD-10-CM | POA: Diagnosis not present

## 2018-09-03 DIAGNOSIS — K6289 Other specified diseases of anus and rectum: Secondary | ICD-10-CM

## 2018-09-03 DIAGNOSIS — K210 Gastro-esophageal reflux disease with esophagitis: Secondary | ICD-10-CM | POA: Diagnosis not present

## 2018-09-03 DIAGNOSIS — R1084 Generalized abdominal pain: Secondary | ICD-10-CM

## 2018-09-03 HISTORY — PX: ESOPHAGOGASTRODUODENOSCOPY (EGD) WITH PROPOFOL: SHX5813

## 2018-09-03 HISTORY — PX: COLONOSCOPY WITH PROPOFOL: SHX5780

## 2018-09-03 HISTORY — DX: Fatty (change of) liver, not elsewhere classified: K76.0

## 2018-09-03 SURGERY — COLONOSCOPY WITH PROPOFOL
Anesthesia: General

## 2018-09-03 MED ORDER — SODIUM CHLORIDE 0.9 % IV SOLN
INTRAVENOUS | Status: DC
  Administered 2018-09-03: 1000 mL via INTRAVENOUS

## 2018-09-03 MED ORDER — PROPOFOL 500 MG/50ML IV EMUL
INTRAVENOUS | Status: AC
  Filled 2018-09-03: qty 50

## 2018-09-03 MED ORDER — LIDOCAINE HCL (CARDIAC) PF 100 MG/5ML IV SOSY
PREFILLED_SYRINGE | INTRAVENOUS | Status: DC | PRN
  Administered 2018-09-03: 50 mg via INTRAVENOUS

## 2018-09-03 MED ORDER — PROPOFOL 10 MG/ML IV BOLUS
INTRAVENOUS | Status: AC
  Filled 2018-09-03: qty 20

## 2018-09-03 MED ORDER — LIDOCAINE HCL (PF) 1 % IJ SOLN
INTRAMUSCULAR | Status: AC
  Administered 2018-09-03: 0.3 mL
  Filled 2018-09-03: qty 2

## 2018-09-03 MED ORDER — PROPOFOL 500 MG/50ML IV EMUL
INTRAVENOUS | Status: DC | PRN
  Administered 2018-09-03: 100 ug/kg/min via INTRAVENOUS

## 2018-09-03 NOTE — Telephone Encounter (Signed)
Pt would like to change her pharmacy to [facility] in [place] to [address] please change

## 2018-09-03 NOTE — Transfer of Care (Signed)
Immediate Anesthesia Transfer of Care Note  Patient: Courtney [name]  Procedure(s) Performed: COLONOSCOPY WITH PROPOFOL (N/A ) ESOPHAGOGASTRODUODENOSCOPY (EGD) WITH PROPOFOL (N/A )  Patient Location: PACU and Endoscopy Unit  Anesthesia Type:MAC  Level of Consciousness: awake  Airway & Oxygen Therapy: Patient Spontanous Breathing  Post-op Assessment: Report given to RN  Post vital signs: Reviewed  Last Vitals:  Vitals Value Taken Time  BP    Temp    Pulse 77 09/03/2018  9:04 AM  Resp 15 09/03/2018  9:04 AM  SpO2 100 % 09/03/2018  9:04 AM  Vitals shown include unvalidated device data.  Last Pain:  Vitals:   09/03/18 0903  TempSrc: (P) Tympanic  PainSc:          Complications: No apparent anesthesia complications

## 2018-09-03 NOTE — Telephone Encounter (Signed)
Done

## 2018-09-03 NOTE — Anesthesia Post-op Follow-up Note (Signed)
Anesthesia QCDR form completed.        

## 2018-09-03 NOTE — Op Note (Signed)
[facility] Patient Name: Courtney [name] Procedure Date: 09/03/2018 7:21 AM MRN: [id number] Account #: [id number] Date of Birth: [birth year] Admit Type: Outpatient Age: 28 Room: [facility] ENDO ROOM 2 Gender: Female Note Status: Finalized Procedure:            Colonoscopy Indications:          Hematochezia Providers:             B. Maximino Greenland MD, MD Medicines:            Monitored Anesthesia Care Complications:        No immediate complications. Procedure:            Pre-Anesthesia Assessment:                       - ASA Grade Assessment: II - A patient with mild                        systemic disease.                       - Prior to the procedure, a History and Physical was                        performed, and patient medications, allergies and                        sensitivities were reviewed. The patient's tolerance of                        previous anesthesia was reviewed.                       - The risks and benefits of the procedure and the                        sedation options and risks were discussed with the                        patient. All questions were answered and informed                        consent was obtained.                       - Patient identification and proposed procedure were                        verified prior to the procedure by the physician, the                        nurse, the anesthesiologist, the anesthetist and the                        technician. The procedure was verified in the procedure                        room.                       After obtaining informed consent, the colonoscope was  passed under direct vision. Throughout the procedure,                        the patient's blood pressure, pulse, and oxygen                        saturations were monitored continuously. The                        Colonoscope was introduced through the anus and       advanced to the the cecum, identified by appendiceal                        orifice and ileocecal valve. The colonoscopy was                        performed with ease. The patient tolerated the                        procedure well. The quality of the bowel preparation                        was good. Findings:      The perianal and digital rectal examinations were normal.      A 3 mm polyp was found in the rectum. The polyp was sessile. The polyp       was removed with a cold biopsy forceps. Resection and retrieval were       complete.      The exam was otherwise without abnormality.      The rectum, sigmoid colon, descending colon, transverse colon, ascending       colon and cecum appeared normal.      The retroflexed view of the distal rectum and anal verge was normal and       showed no anal or rectal abnormalities. Impression:           - One 3 mm polyp in the rectum, removed with a cold                        biopsy forceps. Resected and retrieved.                       - The examination was otherwise normal.                       - The rectum, sigmoid colon, descending colon,                        transverse colon, ascending colon and cecum are normal.                       - The distal rectum and anal verge are normal on                        retroflexion view. Recommendation:       - Discharge patient to home (with escort).                       - Advance diet as tolerated.                       -  Continue present medications.                       - Await pathology results.                       - The findings and recommendations were discussed with                        the patient.                       - The findings and recommendations were discussed with                        the patient's family.                       - Return to primary care physician as previously                        scheduled.                       - Repeat colonoscopy in 5 years if  polyp shows adenoma.                        If hyperplastic repeat at age 28 for screenig. Procedure Code(s):    --- Professional ---                       [id number], Colonoscopy, flexible; with biopsy, single or                        multiple Diagnosis Code(s):    --- Professional ---                       K62.1, Rectal polyp                       K92.1, Melena (includes Hematochezia) CPT copyright 2018 American Medical Association. All rights reserved. The codes documented in this report are preliminary and upon coder review may  be revised to meet current compliance requirements.  Melodie BouillonVarnita , MD Michel BickersVarnita B. Maximino Greenlandahiliani MD, MD 09/03/2018 9:00:27 AM This report has been signed electronically. Number of Addenda: 0 Note Initiated On: 09/03/2018 7:21 AM Scope Withdrawal Time: 0 hours 15 minutes 45 seconds  Total Procedure Duration: 0 hours 21 minutes 22 seconds  Estimated Blood Loss: Estimated blood loss: none.      [facility]

## 2018-09-03 NOTE — Op Note (Signed)
[facility] Gastroenterology Patient Name: Courtney [name] Procedure Date: 09/03/2018 7:22 AM MRN: [id number] Account #: [id number] Date of Birth: 1991/02/11 Admit Type: Outpatient Age: 28 Room: [facility] ENDO ROOM 2 Gender: Female Note Status: Finalized Procedure:            Upper GI endoscopy Indications:          Epigastric abdominal pain Providers:            Cloys Vera B. Maximino Greenland MD, MD Medicines:            Monitored Anesthesia Care Complications:        No immediate complications. Procedure:            Pre-Anesthesia Assessment:                       - Prior to the procedure, a History and Physical was                        performed, and patient medications, allergies and                        sensitivities were reviewed. The patient's tolerance of                        previous anesthesia was reviewed.                       - The risks and benefits of the procedure and the                        sedation options and risks were discussed with the                        patient. All questions were answered and informed                        consent was obtained.                       - Patient identification and proposed procedure were                        verified prior to the procedure by the physician, the                        nurse, the anesthesiologist, the anesthetist and the                        technician. The procedure was verified in the procedure                        room.                       - ASA Grade Assessment: II - A patient with mild                        systemic disease.                       After obtaining informed consent, the endoscope was  passed under direct vision. Throughout the procedure,                        the patient's blood pressure, pulse, and oxygen                        saturations were monitored continuously. The Endoscope                        was introduced through the mouth, and  advanced to the                        second part of duodenum. The upper GI endoscopy was                        accomplished with ease. The patient tolerated the                        procedure well. Findings:      One tongue of salmon-colored mucosa was present from 34 to 35 cm. No       other visible abnormalities were present. The maximum longitudinal       extent of these esophageal mucosal changes was 1 cm in length. Mucosa       was biopsied with a cold forceps for histology in a targeted manner and       in 4 quadrants.      The exam of the esophagus was otherwise normal.      A single 5 mm papule (nodule) with no bleeding and no stigmata of recent       bleeding was found in the gastric antrum. It appeared umbilicated and       suggestive of a pancreatic rest.      The entire examined stomach was normal. Biopsies were obtained in the       gastric body, at the incisura and in the gastric antrum with cold       forceps for histology. Biopsies were taken with a cold forceps for       Helicobacter pylori testing.      The duodenal bulb, second portion of the duodenum and examined duodenum       were normal. Impression:           - Salmon-colored mucosa suspicious for short-segment                        Barrett's esophagus. Biopsied.                       - A single papule (nodule) found in the stomach.                       - Normal stomach. Biopsied.                       - Normal duodenal bulb, second portion of the duodenum                        and examined duodenum.                       - Biopsies were obtained in the gastric body, at the  incisura and in the gastric antrum. Recommendation:       - Discharge patient to home (with escort).                       - Advance diet as tolerated.                       - Continue present medications.                       - Patient has a contact number available for                        emergencies. The  signs and symptoms of potential                        delayed complications were discussed with the patient.                        Return to normal activities tomorrow. Written discharge                        instructions were provided to the patient.                       - Discharge patient to home (with escort).                       - The findings and recommendations were discussed with                        the patient.                       - The findings and recommendations were discussed with                        the patient's family.                       - Follow an antireflux regimen.                       - Take prescribed proton pump inhibitor or H2 blocker                        (antacid) medications 30 - 60 minutes before meals.                       - Return to my office as previously scheduled. Procedure Code(s):    --- Professional ---                       [phone], Esophagogastroduodenoscopy, flexible, transoral;                        with biopsy, single or multiple Diagnosis Code(s):    --- Professional ---                       K22.8, Other specified diseases of esophagus  K31.89, Other diseases of stomach and duodenum                       R10.13, Epigastric pain CPT copyright 2018 American Medical Association. All rights reserved. The codes documented in this report are preliminary and upon coder review may  be revised to meet current compliance requirements.  [name], MD Michel Bickers B. Maximino Greenland MD, MD 09/03/2018 8:30:53 AM This report has been signed electronically. Number of Addenda: 0 Note Initiated On: 09/03/2018 7:22 AM Estimated Blood Loss: Estimated blood loss: none.      [facility]

## 2018-09-03 NOTE — H&P (Signed)
[name], MD [address], [place], [place], [address] [address], [address], [place], [place], [address] Phone: [phone]  Fax: [phone]  Primary Care Physician:  [name], MD   Pre-Procedure History & Physical: HPI:  Courtney [name] is a 28 y.o. female is here for a colonoscopy and EGD.   Past Medical History:  Diagnosis Date  . ADHD    As a child  . Anxiety   . Bipolar 1 disorder (HCC)   . Fatty liver   . OCD (obsessive compulsive disorder)   . PTSD (post-traumatic stress disorder)   . Pyloric stenosis     Past Surgical History:  Procedure Laterality Date  . APPENDECTOMY  02/17/2014  . pyloric stenosis repair      Prior to Admission medications   Medication Sig Start Date End Date Taking? Authorizing Provider  ARIPiprazole (ABILIFY) 20 MG tablet Take 1 tablet by mouth at bedtime. 07/06/18   [provider]  CONCERTA 36 MG CR tablet Take 1 tablet by mouth daily. 07/07/18   Garnet Koyanagi, FNP  lamoTRIgine (LAMICTAL) 100 MG tablet Take 2 tablets (200 mg total) by mouth daily. 06/26/18 07/26/18  [name] Bucy, MD  lamoTRIgine (LAMICTAL) 200 MG tablet Take 200 mg by mouth daily.    [provider]  Multiple Vitamin (MULTIVITAMIN) tablet Take 1 tablet by mouth daily.    [provider]  Psyllium 55.46 % POWD Take 1 Scoop by mouth daily. 01/07/18   Pasty Spillers, MD  risperiDONE (RISPERDAL) 2 MG tablet Take 2 tablets (4 mg total) by mouth at bedtime. 06/26/18 08/26/18  [name] Bucy, MD    Allergies as of 07/30/2018 - Review Complete 07/21/2018  Allergen Reaction Noted  . Vyvanse [lisdexamfetamine] Other (See Comments) 07/10/2017    Family History  Problem Relation Age of Onset  . COPD Mother   . Hypertension Mother   . Asthma Mother   . Arthritis Mother   . Pancreatic cancer Mother        slow growing  . ADD / ADHD Mother   . Other Mother        Spinal Stenosis - currently in  surgery today  . Bipolar disorder Father   . Arthritis Father   . Hypercholesterolemia Father   . Asthma Brother   . Hernia Brother   . ADD / ADHD Brother   . Hypertension Maternal Grandmother   . Heart disease Maternal Grandmother 71  . Rheumatic fever Maternal Grandmother   . Heart attack Maternal Grandmother        Bypass Surgery  . Pancreatic cancer Maternal Grandfather   . Liver cancer Maternal Grandfather   . Obesity Paternal Grandmother     Social History   Socioeconomic History  . Marital status: Single    Spouse name: Not on file  . Number of children: 0  . Years of education: Not on file  . Highest education level: Not on file  Occupational History  . Occupation: disability     Comment: mental health   Social Needs  . Financial resource strain: Somewhat hard  . Food insecurity:    Worry: Never true    Inability: Never true  . Transportation needs:    Medical: No    Non-medical: No  Tobacco Use  . Smoking status: Former Smoker    Packs/day: 0.25    Years: 10.00    Pack years: 2.50    Types: Cigarettes    Start date: 01/01/2008  .  Smokeless tobacco: Never Used  Substance and Sexual Activity  . Alcohol use: Yes    Comment: occasionally drink a beer  . Drug use: Not Currently    Types: Marijuana    Comment: cocaine in the past but not currently   . Sexual activity: Yes    Partners: Male    Birth control/protection: Condom  Lifestyle  . Physical activity:    Days per week: 5 days    Minutes per session: 60 min  . Stress: Rather much  Relationships  . Social connections:    Talks on phone: More than three times a week    Gets together: Three times a week    Attends religious service: More than 4 times per year    Active member of club or organization: Yes    Attends meetings of clubs or organizations: More than 4 times per year    Relationship status: Never married  . Intimate partner violence:    Fear of current or ex partner: No    Emotionally  abused: No    Physically abused: No    Forced sexual activity: No  Other Topics Concern  . Not on file  Social History Narrative   Moved to [place] from [facility] to be closer to family. Parents moved here in 2018.   On disability for bipolar disorder since young age, had to be placed in a group home and foster home because of behavior. She is now living with parents since she decided to take her medications.     Review of Systems: See HPI, otherwise negative ROS  Physical Exam: BP 114/72   Pulse 94   Temp 97.8 F (36.6 C) (Tympanic)   Resp 16   Ht 5' (1.524 m)   Wt 74.4 kg   SpO2 99%   BMI 32.03 kg/m  General:   Alert,  pleasant and cooperative in NAD Head:  Normocephalic and atraumatic. Neck:  Supple; no masses or thyromegaly. Lungs:  Clear throughout to auscultation, normal respiratory effort.    Heart:  +S1, +S2, Regular rate and rhythm, No edema. Abdomen:  Soft, nontender and nondistended. Normal bowel sounds, without guarding, and without rebound.   Neurologic:  Alert and  oriented x4;  grossly normal neurologically.  Impression/Plan: Courtney [name] is here for a colonoscopy and EGD rectal bleeding and abdominal pain.   Risks, benefits, limitations, and alternatives regarding the procedures have been reviewed with the patient.  Questions have been answered.  All parties agreeable.   Pasty Spillers, MD  09/03/2018, 8:12 AM

## 2018-09-03 NOTE — Anesthesia Preprocedure Evaluation (Signed)
Anesthesia Evaluation  Patient identified by MRN, date of birth, ID band Patient awake    Reviewed: Allergy & Precautions, NPO status , Patient's Chart, lab work & pertinent test results  History of Anesthesia Complications Negative for: history of anesthetic complications  Airway Mallampati: I  TM Distance: >3 FB Neck ROM: Full    Dental no notable dental hx.    Pulmonary neg pulmonary ROS, neg sleep apnea, neg COPD, former smoker,    breath sounds clear to auscultation- rhonchi (-) wheezing      Cardiovascular Exercise Tolerance: Good (-) hypertension(-) CAD, (-) Past MI, (-) Cardiac Stents and (-) CABG  Rhythm:Regular Rate:Normal - Systolic murmurs and - Diastolic murmurs    Neuro/Psych neg Seizures PSYCHIATRIC DISORDERS Anxiety Depression Bipolar Disorder negative neurological ROS     GI/Hepatic negative GI ROS, Neg liver ROS,   Endo/Other  negative endocrine ROSneg diabetes  Renal/GU negative Renal ROS     Musculoskeletal negative musculoskeletal ROS (+)   Abdominal (+) + obese,   Peds  Hematology negative hematology ROS (+)   Anesthesia Other Findings Past Medical History: No date: ADHD     Comment:  As a child No date: Anxiety No date: Bipolar 1 disorder (HCC) No date: Fatty liver No date: OCD (obsessive compulsive disorder) No date: PTSD (post-traumatic stress disorder) No date: Pyloric stenosis   Reproductive/Obstetrics                             Anesthesia Physical Anesthesia Plan  ASA: II  Anesthesia Plan: General   Post-op Pain Management:    Induction: Intravenous  PONV Risk Score and Plan: 2 and Propofol infusion  Airway Management Planned: Natural Airway  Additional Equipment:   Intra-op Plan:   Post-operative Plan:   Informed Consent: I have reviewed the patients History and Physical, chart, labs and discussed the procedure including the risks,  benefits and alternatives for the proposed anesthesia with the patient or authorized representative who has indicated his/her understanding and acceptance.     Dental advisory given  Plan Discussed with: CRNA and Anesthesiologist  Anesthesia Plan Comments:         Anesthesia Quick Evaluation

## 2018-09-03 NOTE — Anesthesia Postprocedure Evaluation (Signed)
Anesthesia Post Note  Patient: Sherin Ledingham  Procedure(s) Performed: COLONOSCOPY WITH PROPOFOL (N/A ) ESOPHAGOGASTRODUODENOSCOPY (EGD) WITH PROPOFOL (N/A )  Patient location during evaluation: Endoscopy Anesthesia Type: General Level of consciousness: awake and alert and oriented Pain management: pain level controlled Vital Signs Assessment: post-procedure vital signs reviewed and stable Respiratory status: spontaneous breathing, nonlabored ventilation and respiratory function stable Cardiovascular status: blood pressure returned to baseline and stable Postop Assessment: no signs of nausea or vomiting Anesthetic complications: no     Last Vitals:  Vitals:   09/03/18 0903 09/03/18 0905  BP: (!) 95/39 (!) 96/55  Pulse:  74  Resp:  15  Temp: (!) 36 C   SpO2:  100%    Last Pain:  Vitals:   09/03/18 0938  TempSrc:   PainSc: 0-No pain                 Rhyli Depaula

## 2018-09-06 ENCOUNTER — Encounter: Payer: Self-pay | Admitting: Gastroenterology

## 2018-09-06 LAB — SURGICAL PATHOLOGY

## 2018-09-07 ENCOUNTER — Other Ambulatory Visit: Payer: Self-pay | Admitting: Gastroenterology

## 2018-09-07 MED ORDER — OMEPRAZOLE 20 MG PO CPDR: 20.0000 mg | DELAYED_RELEASE_CAPSULE | Freq: Every day | ORAL | 0 refills | Status: DC

## 2018-09-22 ENCOUNTER — Telehealth: Payer: Self-pay

## 2018-09-22 ENCOUNTER — Encounter: Payer: Self-pay | Admitting: Family Medicine

## 2018-09-22 ENCOUNTER — Ambulatory Visit: Payer: Medicaid Other | Admitting: Family Medicine

## 2018-09-22 VITALS — BP 98/60 | HR 75 | Temp 97.6°F | Resp 16 | Ht 60.0 in | Wt 168.5 lb

## 2018-09-22 DIAGNOSIS — F4310 Post-traumatic stress disorder, unspecified: Secondary | ICD-10-CM

## 2018-09-22 DIAGNOSIS — F902 Attention-deficit hyperactivity disorder, combined type: Secondary | ICD-10-CM

## 2018-09-22 DIAGNOSIS — F319 Bipolar disorder, unspecified: Secondary | ICD-10-CM | POA: Diagnosis not present

## 2018-09-22 NOTE — Telephone Encounter (Signed)
Copied from CRM [id number]. Topic: General - Other >> Sep 22, 2018  4:07 PM Jaquita Rector A wrote: Reason for CRM: Marcelino Duster from [facility] called to request Fl2 form faxed to these 2 numbers.   Fax# [phone]  Att. Velora Heckler ( At [facility]) and also to [facility] Fax# [phone]

## 2018-09-22 NOTE — Telephone Encounter (Signed)
Marcelino Duster from [facility] called to request Fl2 form faxed to these 2 numbers.   Fax# [phone]  Att. Velora Heckler ( At [facility]) and also to [facility] Fax# [phone]

## 2018-09-22 NOTE — Progress Notes (Signed)
Name: Courtney [name]   MRN: [id number]    DOB: June 11, 1991   Date:09/22/2018       Progress Note  Subjective  Chief Complaint  Chief Complaint  Patient presents with  . Form Completion    FL-2 form completion  . Mental Health Problem    HPI  Mental health problems: she is living with parents and twin brother, she is unable to work secondary to mental health problems including bipolar disorder, ADHD, depression with psychotic features, PTSD and anxiety. She is seeing Judee Clara, NP in [place] for medication management, also a therapist,  and recently seeing social worker that is trying to get her own place to live. She needs FL2 forms filled out today. Reviewed medications today with patient. She states mood has been stable, no suicidal thoughts or ideation, normal energy level, normal appetite.    Patient Active Problem List   Diagnosis Date Noted  . Columnar epithelial-lined lower esophagus   . 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 Abdominal pain, epigastric   . Rectal polyp   . Melena   . Severe anxiety 03/19/2018  . Moderate major depression (HCC) 03/19/2018  . Bipolar disorder, unspecified (HCC) 02/27/2018  . Posttraumatic stress disorder   . Cannabis dependence (HCC)   . PTSD (post-traumatic stress disorder) 12/31/2017  . Bipolar I disorder, current or most recent episode manic, with psychotic features (HCC) 12/31/2017  . Anxiety 12/31/2017  . OCD (obsessive compulsive disorder) 12/31/2017  . ADHD 12/31/2017  . Prediabetes 12/31/2017    Past Surgical History:  Procedure Laterality Date  . APPENDECTOMY  02/17/2014  . COLONOSCOPY WITH PROPOFOL N/A 09/03/2018   Procedure: COLONOSCOPY WITH PROPOFOL;  Surgeon: Pasty Spillers, MD;  Location: [facility] ENDOSCOPY;  Service: Endoscopy;  Laterality: N/A;  . ESOPHAGOGASTRODUODENOSCOPY (EGD) WITH PROPOFOL N/A 09/03/2018   Procedure: ESOPHAGOGASTRODUODENOSCOPY (EGD) WITH PROPOFOL;  Surgeon: Pasty Spillers, MD;   Location: [facility] ENDOSCOPY;  Service: Endoscopy;  Laterality: N/A;  . pyloric stenosis repair      Family History  Problem Relation Age of Onset  . COPD Mother   . Hypertension Mother   . Asthma Mother   . Arthritis Mother   . Pancreatic cancer Mother        slow growing  . ADD / ADHD Mother   . Other Mother        Spinal Stenosis - currently in surgery today  . Bipolar disorder Father   . Arthritis Father   . Hypercholesterolemia Father   . Asthma Brother   . Hernia Brother   . ADD / ADHD Brother   . Hypertension Maternal Grandmother   . Heart disease Maternal Grandmother 89  . Rheumatic fever Maternal Grandmother   . Heart attack Maternal Grandmother        Bypass Surgery  . Pancreatic cancer Maternal Grandfather   . Liver cancer Maternal Grandfather   . Obesity Paternal Grandmother     Social History   Socioeconomic History  . Marital status: Single    Spouse name: Not on file  . Number of children: 0  . Years of education: Not on file  . Highest education level: Not on file  Occupational History  . Occupation: disability     Comment: mental health   Social Needs  . Financial resource strain: Somewhat hard  . Food insecurity:    Worry: Never true    Inability: Never true  . Transportation needs:    Medical: No    Non-medical: No  Tobacco Use  . Smoking status: Former Smoker    Packs/day: 0.25    Years: 10.00    Pack years: 2.50    Types: Cigarettes    Start date: 01/01/2008  . Smokeless tobacco: Never Used  Substance and Sexual Activity  . Alcohol use: Yes    Comment: occasionally drink a beer  . Drug use: Not Currently    Types: Marijuana    Comment: cocaine in the past but not currently   . Sexual activity: Yes    Partners: Male    Birth control/protection: Condom  Lifestyle  . Physical activity:    Days per week: 5 days    Minutes per session: 60 min  . Stress: Rather much  Relationships  . Social connections:    Talks on phone: More than  three times a week    Gets together: Three times a week    Attends religious service: More than 4 times per year    Active member of club or organization: Yes    Attends meetings of clubs or organizations: More than 4 times per year    Relationship status: Never married  . Intimate partner violence:    Fear of current or ex partner: No    Emotionally abused: No    Physically abused: No    Forced sexual activity: No  Other Topics Concern  . Not on file  Social History Narrative   [place]. Parents moved here in 2018.   On disability for bipolar disorder since young age, had to be placed in a group home and foster home because of behavior. She is now living with parents since she decided to take her medications.      Current Outpatient Medications:  .  ABILIFY MAINTENA 400 MG SRER injection, INJ 1 ML IM MONTHLY, Disp: , Rfl:  .  CONCERTA 36 MG CR tablet, Take 1 tablet by mouth daily., Disp: , Rfl: 0 .  hydrOXYzine (ATARAX/VISTARIL) 50 MG tablet, TK 2 TS PO HS, Disp: , Rfl:  .  lamoTRIgine (LAMICTAL) 200 MG tablet, Take 200 mg by mouth daily., Disp: , Rfl:  .  Multiple Vitamin (MULTIVITAMIN) tablet, Take 1 tablet by mouth daily., Disp: , Rfl:  .  Psyllium 55.46 % POWD, Take 1 Scoop by mouth daily., Disp: 1 Bottle, Rfl: 1 .  ARIPiprazole (ABILIFY) 20 MG tablet, Take 1 tablet by mouth at bedtime., Disp: , Rfl: 0 .  lamoTRIgine (LAMICTAL) 100 MG tablet, Take 2 tablets (200 mg total) by mouth daily., Disp: 60 tablet, Rfl: 0 .  risperidone (RISPERDAL) 4 MG tablet, Take 1 tablet by mouth every evening., Disp: , Rfl:   Allergies  Allergen Reactions  . Vyvanse [Lisdexamfetamine] Other (See Comments)    Bounce off of the wall    I personally reviewed active problem list, medication list, allergies, family history, social history with the patient/caregiver today.   ROS  Ten systems reviewed and is negative except as mentioned in  HPI  Objective  Vitals:   09/22/18 1310  BP: 98/60  Pulse: 75  Resp: 16  Temp: 97.6 F (36.4 C)  TempSrc: Oral  SpO2: 95%  Weight: 168 lb 8 oz (76.4 kg)  Height: 5' (1.524 m)    Body mass index is 32.91 kg/m.  Physical Exam  Constitutional: Patient appears well-developed and well-nourished. Obese  No distress.  HEENT: head atraumatic, normocephalic, pupils equal and reactive to light, neck supple, throat within  normal limits Cardiovascular: Normal rate, regular rhythm and normal heart sounds.  No murmur heard. No BLE edema. Pulmonary/Chest: Effort normal and breath sounds normal. No respiratory distress. Abdominal: Soft.  There is no tenderness. Psychiatric: Patient has a normal mood and affect. behavior is normal. Judgment and thought content normal . Recent Results (from the past 2160 hour(s))  COMPLETE METABOLIC PANEL WITH GFR     Status: None   Collection Time: 07/20/18  2:33 PM  Result Value Ref Range   Glucose, Bld 88 65 - 99 mg/dL    Comment: .            Fasting reference interval .    BUN 16 7 - 25 mg/dL   Creat 4.00 8.67 - 6.19 mg/dL   GFR, Est Non African American 107 > OR = 60 mL/min/1.81m2   GFR, Est African American 124 > OR = 60 mL/min/1.72m2   BUN/Creatinine Ratio NOT APPLICABLE 6 - 22 (calc)   Sodium 140 135 - 146 mmol/L   Potassium 4.7 3.5 - 5.3 mmol/L   Chloride 102 98 - 110 mmol/L   CO2 29 20 - 32 mmol/L   Calcium 9.7 8.6 - 10.2 mg/dL   Total Protein 7.1 6.1 - 8.1 g/dL   Albumin 4.1 3.6 - 5.1 g/dL   Globulin 3.0 1.9 - 3.7 g/dL (calc)   AG Ratio 1.4 1.0 - 2.5 (calc)   Total Bilirubin 0.3 0.2 - 1.2 mg/dL   Alkaline phosphatase (APISO) 49 33 - 115 U/L   AST 14 10 - 30 U/L   ALT 12 6 - 29 U/L  Hemoglobin A1c     Status: None   Collection Time: 07/20/18  2:33 PM  Result Value Ref Range   Hgb A1c MFr Bld 5.5 <5.7 % of total Hgb    Comment: For the purpose of screening for the presence of diabetes: . <5.7%       Consistent with the absence  of diabetes 5.7-6.4%    Consistent with increased risk for diabetes             (prediabetes) > or =6.5%  Consistent with diabetes . This assay result is consistent with a decreased risk of diabetes. . Currently, no consensus exists regarding use of hemoglobin A1c for diagnosis of diabetes in children. . According to American Diabetes Association (ADA) guidelines, hemoglobin A1c <7.0% represents optimal control in non-pregnant diabetic patients. Different metrics may apply to specific patient populations.  Standards of Medical Care in Diabetes(ADA). .    Mean Plasma Glucose 111 (calc)   eAG (mmol/L) 6.2 (calc)  B12 and Folate Panel     Status: None   Collection Time: 07/20/18  2:33 PM  Result Value Ref Range   Vitamin B-12 547 200 - 1,100 pg/mL   Folate 15.2 ng/mL    Comment:                            Reference Range                            Low:           <3.4                            Borderline:    3.4-5.4  Normal:        >5.4 .   Hepatitis A antibody, total     Status: None   Collection Time: 07/21/18  2:43 PM  Result Value Ref Range   Hep A Total Ab Negative Negative  Hepatitis B surface antigen     Status: None   Collection Time: 07/21/18  2:43 PM  Result Value Ref Range   Hepatitis B Surface Ag Negative Negative  Hepatitis B surface antibody,qualitative     Status: None   Collection Time: 07/21/18  2:43 PM  Result Value Ref Range   Hep B Surface Ab, Qual Non Reactive     Comment:               Non Reactive: Inconsistent with immunity,                             less than 10 mIU/mL               Reactive:     Consistent with immunity,                             greater than 9.9 mIU/mL   Hepatitis B core antibody, total     Status: None   Collection Time: 07/21/18  2:43 PM  Result Value Ref Range   Hep B Core Total Ab Negative Negative  Hepatitis c antibody (reflex)     Status: None   Collection Time: 07/21/18  2:43 PM  Result  Value Ref Range   HCV Ab <0.1 0.0 - 0.9 s/co ratio  HCV Comment:     Status: None   Collection Time: 07/21/18  2:43 PM  Result Value Ref Range   Comment: Comment     Comment: Non reactive HCV antibody screen is consistent with no HCV infection, unless recent infection is suspected or other evidence exists to indicate HCV infection.   Surgical pathology     Status: None   Collection Time: 09/03/18  8:21 AM  Result Value Ref Range   SURGICAL PATHOLOGY      Surgical Pathology CASE: [id number] [facility]     SPECIMEN SUBMITTED: A. Stomach, r/o H. pylori; cbx B. GEJ, Barretts; cbx C. Rectum polyp; cbx  CLINICAL HISTORY: None provided  PRE-OPERATIVE DIAGNOSIS: R10.84 abdominal pain, K62.89 rectal pain  POST-OPERATIVE DIAGNOSIS: Barrett's esophagus polyp     DIAGNOSIS: A. STOMACH; COLD BIOPSY: - GASTRIC ANTRAL AND OXYNTIC MUCOSA WITH MILD CHRONIC INACTIVE GASTRITIS. - NEGATIVE FOR H. PYLORI, DYSPLASIA, AND MALIGNANCY.  B. GASTROESOPHAGEAL JUNCTION; COLD BIOPSY: - SQUAMOCOLUMNAR MUCOSA WITH FEATURES OF REFLUX GASTROESOPHAGITIS. - NO INCREASE IN INTRAEPITHELIAL EOSINOPHILS (LESS THAN 2 PER HPF). - NEGATIVE FOR INTESTINAL METAPLASIA, DYSPLASIA, AND MALIGNANCY.  C. RECTAL POLYP; COLD BIOPSY: - CHANGES COMPATIBLE WITH MUCOSAL PROLAPSE. - NEGATIVE FOR DYSPLASIA AND MALIGNANCY.  Note: Multiple additional deeper recut levels were examined.  GROSS DESCRIPTION: A. La beled: C BX stomach rule out H. pylori Received: Formalin Tissue fragment(s): 2 Size: 0.3 cm Description: Tan soft tissue fragments Entirely submitted in 1 cassette.  B. Labeled: C BX GEJ for Barrett's Received: Formalin Tissue fragment(s): 2 Size: 0.2-0.3 cm Description: Tan soft tissue fragments Entirely submitted in 1 cassette.  C. Labeled: C BX rectal polyp Received: Formalin Tissue fragment(s): 2 Size: 0.2-0.3 cm Description: Tan soft tissue  fragments Entirely submitted in 1 cassette.    Final  Diagnosis performed by Katherine Mantle, MD.   Electronically signed 09/06/2018 2:28:33PM The electronic signature indicates that the named Attending Pathologist has evaluated the specimen  Technical component performed at [facility], [address] [place], [place] [address] Lab: [phone] Dir: Jolene Schimke, MD, MMM  Professional component performed at [facility], [address] [place], [place], [place] [address] Lab: [phone] Dir: Georgiann Cocker.  Rubinas, MD     PHQ2/9: Depression screen [facility] 2/9 09/22/2018 08/26/2018 07/20/2018 02/24/2018 12/31/2017  Decreased Interest 0 1 2 0 1  Down, Depressed, Hopeless 0 1 1 1  0  PHQ - 2 Score 0 2 3 1 1   Altered sleeping 0 2 0 3 3  Tired, decreased energy 1 1 2  0 1  Change in appetite 0 0 0 3 2  Feeling bad or failure about yourself  1 - 3 1 3   Trouble concentrating 0 2 1 2 3   Moving slowly or fidgety/restless 0 0 1 3 3   Suicidal thoughts 0 0 0 3 2  PHQ-9 Score 2 7 10 8 18   Difficult doing work/chores Not difficult at all Not difficult at all Very difficult Extremely dIfficult Extremely dIfficult     Fall Risk: Fall Risk  09/22/2018 08/26/2018 07/20/2018 12/31/2017  Falls in the past year? 0 0 0 No  Number falls in past yr: 0 0 - -  Injury with Fall? 0 0 - -  Follow up - Falls evaluation completed - -      Functional Status Survey: Is the patient deaf or have difficulty hearing?: No Does the patient have difficulty seeing, even when wearing glasses/contacts?: No Does the patient have difficulty concentrating, remembering, or making decisions?: No Does the patient have difficulty walking or climbing stairs?: No Does the patient have difficulty dressing or bathing?: No Does the patient have difficulty doing errands alone such as visiting a doctor's office or shopping?: No    Assessment & Plan  1. Bipolar 1 disorder (HCC)  Continue follow up with psychiatrist , FL2  forms filled out   2. PTSD (post-traumatic stress disorder)   3. Attention deficit hyperactivity disorder (ADHD), combined type

## 2018-09-22 NOTE — Telephone Encounter (Signed)
Courtney [name] please let patient know, her biopsies were benign. There is no signs of H. pylori infection in her stomach. She had a mild sign of barretts on her endoscopy. It is important to prevent reflux and she should use a bed wedge at night, not lay down for 3 hrs after eating, and avoid foods that cause reflux. We will start her on Omeprazole 20 mg daily to prevent worsening of Barrett's. She should have an EGD in 3 years to reassess her esophagus for Barretts. Please set recall   Her rectal polyp was benign. She should have a colonoscopy when she is 46 for screening.   There was an area of her stomach that should pancreatic tissue. This needs further evaluation with EUS. If she is agreeable please contact Ladora Daniel via epic to schedule.   LEFT MESSAGE TO CONTACT OFFICE.

## 2018-09-22 NOTE — Telephone Encounter (Signed)
Pt is returning a call for debbie please call her on cb [phone]

## 2018-09-23 NOTE — Telephone Encounter (Signed)
Pt is calling about prev. Message for results

## 2018-09-23 NOTE — Telephone Encounter (Signed)
Dee(student) will fax them now.

## 2018-09-24 MED ORDER — OMEPRAZOLE 20 MG PO CPDR: 20.0000 mg | DELAYED_RELEASE_CAPSULE | Freq: Every day | ORAL | 3 refills | Status: DC

## 2018-09-24 NOTE — Telephone Encounter (Signed)
Pt is calling about pev. 2 mssages for test results

## 2018-09-24 NOTE — Telephone Encounter (Signed)
[employer] on mobile number. Called [phone] and spoke with pt. Informed of results and needs repeat 3 year EGD, will send recall letter. Would like omeprazole rx sent to [facility] [address] near [employer]. Also pt to expect a call from Ladora Daniel to schedule EUS. Pt contact number [phone].

## 2018-09-27 ENCOUNTER — Telehealth: Payer: Self-pay

## 2018-09-27 ENCOUNTER — Other Ambulatory Visit: Payer: Self-pay

## 2018-09-27 NOTE — Telephone Encounter (Signed)
Received referral for EUS from Dr. Maximino Greenland for EUS to evaluate gastric papule. I have contacted Courtney [name] and she has been scheduled for 10/15/18 with Dr. Nanda Quinton at [facility]. I have went over her instructions and a copy has been mailed to her home address. Denies diabetes or anticoagulants.   INSTRUCTIONS FOR ENDOSCOPIC ULTRASOUND -Your procedure has been scheduled for March 5th with Dr. Nanda Quinton at  [facility]. -The hospital may contact you to pre-register over the phone.  -To get your scheduled arrival time, please call the Endoscopy unit at  [phone] between 1-3 p.m. on:  March 4th    -ON THE DAY OF YOU PROCEDURE:   1. If you are scheduled for a morning procedure, nothing to drink after midnight  -If you are scheduled for an afternoon procedure, you may have clear liquids until 5 hours prior  to the procedure but no carbonated drinks or broth  2. NO FOOD THE DAY OF YOUR PROCEDURE  3. You may take your heart, seizure, blood pressure, Parkinson's or breathing medications at  6am with just enough water to get your pills down  4. Do not take any oral Diabetic medications the morning of your procedure.  5. If you are a diabetic and are using insulin, please notify your prescribing physician of this  procedure, as your dose may need to be altered related to not being able to eat or drink.   6. Do not take vitamins, iron, or fish oil for 5 days before your procedure     -On the day of your procedure, come to the [facility] Admitting/Registration desk (First desk on the right) at the scheduled arrival time. You MUST have someone drive you home from your procedure. You must have a responsible adult with a valid driver's license who is on site throughout your entire procedure and who can stay with you for several hours after your procedure. You may not go home alone in a taxi, shuttle [place] or bus, as the drivers will not be responsible for you.  --If you have any  questions please call me at the above contact

## 2018-10-04 ENCOUNTER — Telehealth: Payer: Self-pay | Admitting: Gastroenterology

## 2018-10-04 DIAGNOSIS — R7989 Other specified abnormal findings of blood chemistry: Secondary | ICD-10-CM

## 2018-10-04 NOTE — Telephone Encounter (Signed)
Patient called & L/M on V/M she would like to cancel her procedure for 10-14-2018 & r/s.

## 2018-10-07 ENCOUNTER — Telehealth: Payer: Self-pay

## 2018-10-07 NOTE — Telephone Encounter (Signed)
Dr. Malvin Johns sent fax referring patient to go to Endocrinology because her labs showed elevated free T4. Please sign referral.

## 2018-10-07 NOTE — Telephone Encounter (Signed)
Received call from Courtney [name] asking to reschedule her EUS due to appointment conflict. She then realized that she will not have a conflict and would like to keep her procedure scheduled for 3/5. No changes in dates made.

## 2018-10-08 NOTE — Telephone Encounter (Signed)
Left message for pt to contact office to reschedule EUS for 10/14/18.

## 2018-10-11 ENCOUNTER — Other Ambulatory Visit (HOSPITAL_COMMUNITY): Payer: Self-pay | Admitting: Neurology

## 2018-10-11 ENCOUNTER — Other Ambulatory Visit: Payer: Self-pay | Admitting: Neurology

## 2018-10-11 DIAGNOSIS — E559 Vitamin D deficiency, unspecified: Secondary | ICD-10-CM | POA: Insufficient documentation

## 2018-10-11 DIAGNOSIS — E538 Deficiency of other specified B group vitamins: Secondary | ICD-10-CM | POA: Insufficient documentation

## 2018-10-11 DIAGNOSIS — G35 Multiple sclerosis: Secondary | ICD-10-CM

## 2018-10-11 NOTE — Telephone Encounter (Signed)
Sent message to [facility] [place] that schedules EUS procedures that pt is wanting to reschedule her procedure, scheduled for 10/14/18. Pt has not returned call.

## 2018-10-14 ENCOUNTER — Other Ambulatory Visit: Payer: Self-pay

## 2018-10-14 ENCOUNTER — Ambulatory Visit: Payer: Medicaid Other | Admitting: Certified Registered Nurse Anesthetist

## 2018-10-14 ENCOUNTER — Ambulatory Visit
Admission: RE | Admit: 2018-10-14 | Discharge: 2018-10-14 | Disposition: A | Discharge status: Home / Self Care | Payer: Medicaid Other | Attending: Gastroenterology | Admitting: Gastroenterology

## 2018-10-14 ENCOUNTER — Encounter
Admission: RE | Disposition: A | Discharge status: Home / Self Care | Payer: Self-pay | Source: Home / Self Care | Attending: Gastroenterology

## 2018-10-14 DIAGNOSIS — Z888 Allergy status to other drugs, medicaments and biological substances status: Secondary | ICD-10-CM | POA: Diagnosis not present

## 2018-10-14 DIAGNOSIS — F419 Anxiety disorder, unspecified: Secondary | ICD-10-CM | POA: Diagnosis not present

## 2018-10-14 DIAGNOSIS — Z79899 Other long term (current) drug therapy: Secondary | ICD-10-CM | POA: Diagnosis not present

## 2018-10-14 DIAGNOSIS — F429 Obsessive-compulsive disorder, unspecified: Secondary | ICD-10-CM | POA: Insufficient documentation

## 2018-10-14 DIAGNOSIS — F909 Attention-deficit hyperactivity disorder, unspecified type: Secondary | ICD-10-CM | POA: Diagnosis not present

## 2018-10-14 DIAGNOSIS — K3189 Other diseases of stomach and duodenum: Secondary | ICD-10-CM | POA: Diagnosis present

## 2018-10-14 DIAGNOSIS — F4310 Post-traumatic stress disorder, unspecified: Secondary | ICD-10-CM | POA: Diagnosis not present

## 2018-10-14 DIAGNOSIS — F319 Bipolar disorder, unspecified: Secondary | ICD-10-CM | POA: Diagnosis not present

## 2018-10-14 HISTORY — DX: Major depressive disorder, single episode, unspecified: F32.9

## 2018-10-14 HISTORY — DX: Depression, unspecified: F32.A

## 2018-10-14 HISTORY — PX: EUS: SHX5427

## 2018-10-14 LAB — URINE DRUG SCREEN, QUALITATIVE (ARMC ONLY)
Amphetamines, Ur Screen: NOT DETECTED
Barbiturates, Ur Screen: NOT DETECTED
Benzodiazepine, Ur Scrn: NOT DETECTED
Cannabinoid 50 Ng, Ur ~~LOC~~: NOT DETECTED
Cocaine Metabolite,Ur ~~LOC~~: NOT DETECTED
MDMA (Ecstasy)Ur Screen: NOT DETECTED
Methadone Scn, Ur: NOT DETECTED
Opiate, Ur Screen: NOT DETECTED
Phencyclidine (PCP) Ur S: NOT DETECTED
Tricyclic, Ur Screen: POSITIVE — AB

## 2018-10-14 LAB — POCT PREGNANCY, URINE: Preg Test, Ur: NEGATIVE

## 2018-10-14 SURGERY — ULTRASOUND, UPPER GI TRACT, ENDOSCOPIC
Anesthesia: General

## 2018-10-14 MED ORDER — PROPOFOL 500 MG/50ML IV EMUL
INTRAVENOUS | Status: DC | PRN
  Administered 2018-10-14: 150 ug/kg/min via INTRAVENOUS

## 2018-10-14 MED ORDER — LIDOCAINE HCL (PF) 2 % IJ SOLN
INTRAMUSCULAR | Status: AC
  Filled 2018-10-14: qty 10

## 2018-10-14 MED ORDER — LIDOCAINE HCL (CARDIAC) PF 100 MG/5ML IV SOSY
PREFILLED_SYRINGE | INTRAVENOUS | Status: DC | PRN
  Administered 2018-10-14: 50 mg via INTRAVENOUS

## 2018-10-14 MED ORDER — PROPOFOL 500 MG/50ML IV EMUL
INTRAVENOUS | Status: AC
  Filled 2018-10-14: qty 50

## 2018-10-14 MED ORDER — SODIUM CHLORIDE 0.9 % IV SOLN
INTRAVENOUS | Status: DC
  Administered 2018-10-14: 13:00:00 via INTRAVENOUS

## 2018-10-14 MED ORDER — MIDAZOLAM HCL 2 MG/2ML IJ SOLN
INTRAMUSCULAR | Status: DC | PRN
  Administered 2018-10-14: 2 mg via INTRAVENOUS

## 2018-10-14 MED ORDER — MIDAZOLAM HCL 2 MG/2ML IJ SOLN
INTRAMUSCULAR | Status: AC
  Filled 2018-10-14: qty 2

## 2018-10-14 MED ORDER — PROPOFOL 10 MG/ML IV BOLUS
INTRAVENOUS | Status: DC | PRN
  Administered 2018-10-14: 20 mg via INTRAVENOUS
  Administered 2018-10-14: 50 mg via INTRAVENOUS

## 2018-10-14 NOTE — Anesthesia Procedure Notes (Signed)
Date/Time: 10/14/2018 1:51 PM Performed by: Ginger Carne, CRNA Pre-anesthesia Checklist: Patient identified and Emergency Drugs available Patient Re-evaluated:Patient Re-evaluated prior to induction Oxygen Delivery Method: Nasal cannula Preoxygenation: Pre-oxygenation with 100% oxygen Induction Type: IV induction

## 2018-10-14 NOTE — Anesthesia Post-op Follow-up Note (Signed)
Anesthesia QCDR form completed.        

## 2018-10-14 NOTE — H&P (Signed)
Outpatient short stay form Pre-procedure 10/14/2018 12:44 PM Courtney [name],  [name], MD  Primary Physician: [name], MD   Reason for visit:  No chief complaint on file.  Submucosal nodule found on recent EGD  -for EUS to evaluate. Currently no abd pain. No diagnosis found.   History of present illness:  Incidental finding of antral nodule at time of recent EGD.   No current facility-administered medications for this encounter.   Medications Prior to Admission  Medication Sig Dispense Refill Last Dose  . ABILIFY MAINTENA 400 MG SRER injection INJ 1 ML IM MONTHLY   Past Week at Unknown time  . CONCERTA 36 MG CR tablet Take 1 tablet by mouth daily.  0 10/13/2018 at Unknown time  . hydrOXYzine (ATARAX/VISTARIL) 50 MG tablet TK 2 TS PO HS   10/13/2018 at Unknown time  . lamoTRIgine (LAMICTAL) 200 MG tablet Take 200 mg by mouth daily.   10/13/2018 at Unknown time  . Multiple Vitamin (MULTIVITAMIN) tablet Take 1 tablet by mouth daily.   10/13/2018 at Unknown time  . risperidone (RISPERDAL) 4 MG tablet Take 1 tablet by mouth every evening.   10/13/2018 at Unknown time  . ARIPiprazole (ABILIFY) 20 MG tablet Take 1 tablet by mouth at bedtime.  0 Not Taking at Unknown time  . lamoTRIgine (LAMICTAL) 100 MG tablet Take 2 tablets (200 mg total) by mouth daily. 60 tablet 0 Taking  . omeprazole (PRILOSEC) 20 MG capsule Take 1 capsule (20 mg total) by mouth daily. (Patient not taking: Reported on 10/14/2018) 30 capsule 3 Not Taking at Unknown time  . Psyllium 55.46 % POWD Take 1 Scoop by mouth daily. 1 Bottle 1 Taking    Allergies  Allergen Reactions  . Vyvanse [Lisdexamfetamine] Other (See Comments)    Bounce off of the wall    Past Medical History:  Diagnosis Date  . ADHD    As a child  . Anxiety   . Bipolar 1 disorder (HCC)   . Fatty liver   . OCD (obsessive compulsive disorder)   . PTSD (post-traumatic stress disorder)   . Pyloric stenosis        Physical Exam  Alert NAD Mental status -  oriented.   Pertinant exam for procedure: Abd soft NT.     Planned proceedures: For upper EUS.    [name] MD Gastroenterology 10/14/2018  12:44 PM

## 2018-10-14 NOTE — Transfer of Care (Signed)
Immediate Anesthesia Transfer of Care Note  Patient: Courtney [name]  Procedure(s) Performed: FULL UPPER ENDOSCOPIC ULTRASOUND (EUS) RADIAL (N/A )  Patient Location: PACU  Anesthesia Type:General  Level of Consciousness: awake, alert  and oriented  Airway & Oxygen Therapy: Patient Spontanous Breathing  Post-op Assessment: Report given to RN and Post -op Vital signs reviewed and stable  Post vital signs: Reviewed and stable  Last Vitals:  Vitals Value Taken Time  BP 113/66 10/14/2018  2:14 PM  Temp 36.1 C 10/14/2018  2:14 PM  Pulse 102 10/14/2018  2:14 PM  Resp 24 10/14/2018  2:14 PM  SpO2 96 % 10/14/2018  2:14 PM    Last Pain:  Vitals:   10/14/18 1414  TempSrc: Tympanic  PainSc: 0-No pain         Complications: No apparent anesthesia complications

## 2018-10-14 NOTE — Anesthesia Preprocedure Evaluation (Signed)
Anesthesia Evaluation  Patient identified by MRN, date of birth, ID band Patient awake    Reviewed: Allergy & Precautions, NPO status , Patient's Chart, lab work & pertinent test results  History of Anesthesia Complications Negative for: history of anesthetic complications  Airway Mallampati: I  TM Distance: >3 FB Neck ROM: Full    Dental  (+) Dental Advidsory Given   Pulmonary neg pulmonary ROS, neg sleep apnea, neg COPD, former smoker,    breath sounds clear to auscultation- rhonchi (-) wheezing      Cardiovascular Exercise Tolerance: Good (-) hypertension(-) CAD, (-) Past MI, (-) Cardiac Stents and (-) CABG negative cardio ROS   Rhythm:Regular Rate:Normal - Systolic murmurs and - Diastolic murmurs    Neuro/Psych neg Seizures PSYCHIATRIC DISORDERS Anxiety Depression Bipolar Disorder negative neurological ROS     GI/Hepatic negative GI ROS, Neg liver ROS,   Endo/Other  negative endocrine ROSneg diabetes  Renal/GU negative Renal ROS     Musculoskeletal negative musculoskeletal ROS (+)   Abdominal (+) + obese,   Peds  Hematology negative hematology ROS (+)   Anesthesia Other Findings Past Medical History: No date: ADHD     Comment:  As a child No date: Anxiety No date: Bipolar 1 disorder (HCC) No date: Fatty liver No date: OCD (obsessive compulsive disorder) No date: PTSD (post-traumatic stress disorder) No date: Pyloric stenosis   Reproductive/Obstetrics                            Anesthesia Physical  Anesthesia Plan  ASA: II  Anesthesia Plan: General   Post-op Pain Management:    Induction: Intravenous  PONV Risk Score and Plan: 2 and Propofol infusion and TIVA  Airway Management Planned: Natural Airway and Nasal Cannula  Additional Equipment:   Intra-op Plan:   Post-operative Plan:   Informed Consent: I have reviewed the patients History and Physical, chart, labs  and discussed the procedure including the risks, benefits and alternatives for the proposed anesthesia with the patient or authorized representative who has indicated his/her understanding and acceptance.     Dental advisory given  Plan Discussed with: CRNA and Anesthesiologist  Anesthesia Plan Comments:        Anesthesia Quick Evaluation

## 2018-10-14 NOTE — Op Note (Signed)
[facility] Gastroenterology Patient Name: Courtney [name] Procedure Date: 10/14/2018 12:16 PM MRN: [id number] Account #: [id number] Date of Birth: [birth year] Admit Type: Outpatient Age: 28 Room: [facility] ENDO ROOM 3 Gender: Female Note Status: Finalized Procedure:            Upper EUS Indications:          Gastric deformity on endoscopy/Subepithelial tumor                        versus extrinsic compression Providers:            [name] Referring MD:         Onnie Boer. Carlynn Purl, MD (Referring MD), Dolphus Jenny.                        Maximino Greenland MD, MD (Referring MD) Medicines:            Monitored Anesthesia Care Complications:        No immediate complications. Procedure:            Pre-Anesthesia Assessment:                       - Please see pre-anesthesia assessment documentation                        already completed in Epic.                       After obtaining informed consent, the endoscope was                        passed under direct vision. Throughout the procedure,                        the patient's blood pressure, pulse, and oxygen                        saturations were monitored continuously. The Endoscope                        was introduced through the mouth, and advanced to the                        stomach for ultrasound examination. The upper EUS was                        accomplished without difficulty. The patient tolerated                        the procedure well. Findings:      ENDOSCOPIC FINDING: :      The examined esophagus was endoscopically normal.      A small, submucosal, non-circumferential mass with no bleeding and no       stigmata of recent bleeding was found on the greater curvature of the       gastric antrum. Central umbilication      The examined duodenum was endoscopically normal.      ENDOSONOGRAPHIC FINDING: :      An oval intramural (subepithelial) lesion was found in the antrum of the       stomach. The  lesion was anechoic (small tubular duct like  structure) and       hypoechoic. Sonographically, the lesion appeared to originate from the       intramural wall, but the wall layer could not be determined. The lesion       also appeared to involve the following wall layer(s): deep mucosa (Layer       2) and submucosa (Layer 3). The lesion measured 7 mm (in maximum       thickness). The lesion also measured 3 mm in diameter.      No abnormal-appearing lymph nodes were seen during endosonographic       examination in the celiac region (level 20) and in the perigastric       region. Impression:           - Normal esophagus.                       - Gastric lesion on the greater curvature of the                        gastric antrum - umbilicated and consistent with                        pancreas rest.                       - Normal examined duodenum.                       EUS:                       - An intramural (subepithelial) lesion was found in the                        antrum of the stomach. The lesion appeared to originate                        from within the intramural wall, but the wall layer                        could not be determined. Tissue has not been obtained.                        However, the endosonographic appearance together with                        the endoscopic appearance, is suggestive of aberrant                        pancreas. No other abnormality seen in the wall deep to                        this lesion.                       - No specimens collected. Recommendation:       - Return to referring physician.                       - Patient has a contact number available for  emergencies. The signs and symptoms of potential                        delayed complications were discussed with the patient.                        Return to normal activities tomorrow. Written discharge                        instructions were provided to the  patient.                       - The findings and recommendations were discussed with                        the patient and their family. Procedure Code(s):    --- Professional ---                       [phone], 52, Esophagogastroduodenoscopy, flexible,                        transoral; with endoscopic ultrasound examination                        limited to the esophagus, stomach or duodenum, and                        adjacent structures Diagnosis Code(s):    --- Professional ---                       K31.89, Other diseases of stomach and duodenum CPT copyright 2018 American Medical Association. All rights reserved. The codes documented in this report are preliminary and upon coder review may  be revised to meet current compliance requirements. Attending Participation:      I personally performed the entire procedure. [name],  10/14/2018 2:20:21 PM This report has been signed electronically. Number of Addenda: 0 Note Initiated On: 10/14/2018 12:16 PM      [facility]

## 2018-10-15 ENCOUNTER — Ambulatory Visit
Admission: RE | Admit: 2018-10-15 | Discharge: 2018-10-15 | Disposition: A | Discharge status: Home / Self Care | Payer: Medicaid Other | Source: Ambulatory Visit | Attending: Neurology | Admitting: Neurology

## 2018-10-15 DIAGNOSIS — G35 Multiple sclerosis: Secondary | ICD-10-CM | POA: Diagnosis present

## 2018-10-15 NOTE — Anesthesia Postprocedure Evaluation (Signed)
Anesthesia Post Note  Patient: Courtney [name]  Procedure(s) Performed: FULL UPPER ENDOSCOPIC ULTRASOUND (EUS) RADIAL (N/A )  Patient location during evaluation: Endoscopy Anesthesia Type: General Level of consciousness: awake and alert Pain management: pain level controlled Vital Signs Assessment: post-procedure vital signs reviewed and stable Respiratory status: spontaneous breathing, nonlabored ventilation, respiratory function stable and patient connected to nasal cannula oxygen Cardiovascular status: blood pressure returned to baseline and stable Postop Assessment: no apparent nausea or vomiting Anesthetic complications: no     Last Vitals:  Vitals:   10/14/18 1424 10/14/18 1434  BP: 106/75 112/81  Pulse: 93 87  Resp: 16 14  Temp:    SpO2: 99% 98%    Last Pain:  Vitals:   10/14/18 1434  TempSrc:   PainSc: 0-No pain                 Lenard Simmer

## 2018-10-19 ENCOUNTER — Telehealth: Payer: Self-pay

## 2018-10-19 ENCOUNTER — Other Ambulatory Visit: Payer: Self-pay | Admitting: Family Medicine

## 2018-10-19 DIAGNOSIS — R7989 Other specified abnormal findings of blood chemistry: Secondary | ICD-10-CM

## 2018-10-19 NOTE — Telephone Encounter (Signed)
Endocrinology on referral states they need a abnormal TSH level to process referral. Please order and patient has been notified to come in to have blood test completed. Her mother will bring her March 18th at 10:30 a.m.

## 2018-11-20 ENCOUNTER — Other Ambulatory Visit: Payer: Self-pay

## 2018-11-20 ENCOUNTER — Emergency Department
Admission: EM | Admit: 2018-11-20 | Discharge: 2018-11-21 | Disposition: A | Discharge status: Other Acute Inpatient Hospital | Payer: Medicaid Other | Attending: Emergency Medicine | Admitting: Emergency Medicine

## 2018-11-20 DIAGNOSIS — R45851 Suicidal ideations: Secondary | ICD-10-CM | POA: Diagnosis not present

## 2018-11-20 DIAGNOSIS — F319 Bipolar disorder, unspecified: Secondary | ICD-10-CM | POA: Insufficient documentation

## 2018-11-20 DIAGNOSIS — Z590 Homelessness: Secondary | ICD-10-CM | POA: Diagnosis not present

## 2018-11-20 DIAGNOSIS — Z79899 Other long term (current) drug therapy: Secondary | ICD-10-CM | POA: Insufficient documentation

## 2018-11-20 DIAGNOSIS — Z046 Encounter for general psychiatric examination, requested by authority: Secondary | ICD-10-CM | POA: Insufficient documentation

## 2018-11-20 DIAGNOSIS — R45850 Homicidal ideations: Secondary | ICD-10-CM | POA: Insufficient documentation

## 2018-11-20 DIAGNOSIS — Z87891 Personal history of nicotine dependence: Secondary | ICD-10-CM | POA: Insufficient documentation

## 2018-11-20 DIAGNOSIS — F329 Major depressive disorder, single episode, unspecified: Secondary | ICD-10-CM | POA: Diagnosis present

## 2018-11-20 LAB — CBC
HCT: 41.5 % (ref 36.0–46.0)
Hemoglobin: 13.6 g/dL (ref 12.0–15.0)
MCH: 27.7 pg (ref 26.0–34.0)
MCHC: 32.8 g/dL (ref 30.0–36.0)
MCV: 84.5 fL (ref 80.0–100.0)
Platelets: 316 10*3/uL (ref 150–400)
RBC: 4.91 MIL/uL (ref 3.87–5.11)
RDW: 15.3 % (ref 11.5–15.5)
WBC: 9.5 10*3/uL (ref 4.0–10.5)
nRBC: 0 % (ref 0.0–0.2)

## 2018-11-20 LAB — COMPREHENSIVE METABOLIC PANEL
ALT: 20 U/L (ref 0–44)
AST: 22 U/L (ref 15–41)
Albumin: 4.6 g/dL (ref 3.5–5.0)
Alkaline Phosphatase: 55 U/L (ref 38–126)
Anion gap: 15 (ref 5–15)
BUN: 12 mg/dL (ref 6–20)
CO2: 27 mmol/L (ref 22–32)
Calcium: 9.8 mg/dL (ref 8.9–10.3)
Chloride: 99 mmol/L (ref 98–111)
Creatinine, Ser: 0.94 mg/dL (ref 0.44–1.00)
GFR calc Af Amer: >60 mL/min (ref >60)
GFR calc non Af Amer: >60 mL/min (ref >60)
Glucose, Bld: 94 mg/dL (ref 70–99)
Potassium: 3.8 mmol/L (ref 3.5–5.1)
Sodium: 141 mmol/L (ref 135–145)
Total Bilirubin: 0.7 mg/dL (ref 0.3–1.2)
Total Protein: 8.1 g/dL (ref 6.5–8.1)

## 2018-11-20 LAB — URINE DRUG SCREEN, QUALITATIVE (ARMC ONLY)
Amphetamines, Ur Screen: NOT DETECTED
Barbiturates, Ur Screen: NOT DETECTED
Benzodiazepine, Ur Scrn: NOT DETECTED
Cannabinoid 50 Ng, Ur ~~LOC~~: POSITIVE — AB
Cocaine Metabolite,Ur ~~LOC~~: NOT DETECTED
MDMA (Ecstasy)Ur Screen: NOT DETECTED
Methadone Scn, Ur: NOT DETECTED
Opiate, Ur Screen: NOT DETECTED
Phencyclidine (PCP) Ur S: NOT DETECTED
Tricyclic, Ur Screen: POSITIVE — AB

## 2018-11-20 LAB — ETHANOL: Alcohol, Ethyl (B): <10 mg/dL (ref <10)

## 2018-11-20 LAB — ACETAMINOPHEN LEVEL: Acetaminophen (Tylenol), Serum: <10 ug/mL — ABNORMAL LOW (ref 10–30)

## 2018-11-20 LAB — SALICYLATE LEVEL: Salicylate Lvl: <7 mg/dL (ref 2.8–30.0)

## 2018-11-20 MED ORDER — RISPERIDONE 1 MG PO TABS
2.0000 mg | ORAL_TABLET | Freq: Every evening | ORAL | Status: DC
  Administered 2018-11-20: 2 mg via ORAL
  Filled 2018-11-20: qty 2

## 2018-11-20 MED ORDER — ARIPIPRAZOLE 15 MG PO TABS
15.0000 mg | ORAL_TABLET | Freq: Every day | ORAL | Status: DC
  Filled 2018-11-20 (×2): qty 1

## 2018-11-20 MED ORDER — NORTRIPTYLINE HCL 10 MG PO CAPS
10.0000 mg | ORAL_CAPSULE | Freq: Every day | ORAL | Status: DC
  Administered 2018-11-20: 10 mg via ORAL
  Filled 2018-11-20 (×2): qty 1

## 2018-11-20 MED ORDER — HYDROXYZINE HCL 25 MG PO TABS
50.0000 mg | ORAL_TABLET | Freq: Every day | ORAL | Status: DC
  Administered 2018-11-20: 50 mg via ORAL
  Filled 2018-11-20: qty 2

## 2018-11-20 NOTE — ED Notes (Signed)
SOC called report given.  SOC machine placed in patients room.

## 2018-11-20 NOTE — ED Notes (Signed)
Pt. Presents to ED voluntary with HI thoughts toward step-father.  Pt. States she had been living at step-fathers residence.  Pt. Told if she came to hospital she would not be welcomed back.  Pt. States she is no homeless.  Pt. States SI thoughts are intermittent.  Pt. States she has never been to this hospital but has been dx with bi-polar.

## 2018-11-20 NOTE — ED Provider Notes (Signed)
[facility] Emergency Department Provider Note   ____________________________________________   First MD Initiated Contact with Patient 11/20/18 1941     (approximate)  I have reviewed the triage vital signs and the nursing notes.   HISTORY  Chief Complaint Psychiatric Evaluation    HPI Courtney [name] is a 28 y.o. female here for evaluation for intermittent thoughts of self-harm or harming others  Patient reports she lives in a group home, to get upset at times about things and when she gets upset she says situationally she will feel like she wants to hurt herself or others.  She reports is a something is been going on for her for years.  Feels like the symptoms got little worse lately despite being on all of her medications.  She reports a history of bipolar disorder and ADHD primarily.  Reports sometimes she will have suicidal ideations toward her stepfather, but does not currently live with him or see him on a regular basis   And currently does not want to harm herself, but does wish to see a psychiatrist.  Past Medical History:  Diagnosis Date  . ADHD    As a child  . Anxiety   . Bipolar 1 disorder (HCC)   . Depression   . Fatty liver   . OCD (obsessive compulsive disorder)   . PTSD (post-traumatic stress disorder)   . Pyloric stenosis     Patient Active Problem List   Diagnosis Date Noted  . Columnar epithelial-lined lower esophagus   . 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 Abdominal pain, epigastric   . Rectal polyp   . Melena   . Severe anxiety 03/19/2018  . Moderate major depression (HCC) 03/19/2018  . Bipolar disorder, unspecified (HCC) 02/27/2018  . Posttraumatic stress disorder   . Cannabis dependence (HCC)   . PTSD (post-traumatic stress disorder) 12/31/2017  . Bipolar I disorder, current or most recent episode manic, with psychotic features (HCC) 12/31/2017  . Anxiety 12/31/2017  . OCD (obsessive compulsive disorder)  12/31/2017  . ADHD 12/31/2017  . Prediabetes 12/31/2017    Past Surgical History:  Procedure Laterality Date  . APPENDECTOMY  02/17/2014  . COLONOSCOPY WITH PROPOFOL N/A 09/03/2018   Procedure: COLONOSCOPY WITH PROPOFOL;  Surgeon: Pasty Spillers, MD;  Location: [facility] ENDOSCOPY;  Service: Endoscopy;  Laterality: N/A;  . ESOPHAGOGASTRODUODENOSCOPY (EGD) WITH PROPOFOL N/A 09/03/2018   Procedure: ESOPHAGOGASTRODUODENOSCOPY (EGD) WITH PROPOFOL;  Surgeon: Pasty Spillers, MD;  Location: [facility] ENDOSCOPY;  Service: Endoscopy;  Laterality: N/A;  . EUS N/A 10/14/2018   Procedure: FULL UPPER ENDOSCOPIC ULTRASOUND (EUS) RADIAL;  Surgeon: Rayann Heman, MD;  Location: [facility] ENDOSCOPY;  Service: Endoscopy;  Laterality: N/A;  . pyloric stenosis repair      Prior to Admission medications   Medication Sig Start Date End Date Taking? Authorizing Provider  ABILIFY MAINTENA 400 MG SRER injection INJ 1 ML IM MONTHLY 08/31/18   [provider]  ARIPiprazole (ABILIFY) 20 MG tablet Take 1 tablet by mouth at bedtime. 07/06/18   [provider]  CONCERTA 36 MG CR tablet Take 1 tablet by mouth daily. 07/07/18   Garnet Koyanagi, FNP  hydrOXYzine (ATARAX/VISTARIL) 50 MG tablet TK 2 TS PO HS 08/31/18   [provider]  lamoTRIgine (LAMICTAL) 100 MG tablet Take 2 tablets (200 mg total) by mouth daily. 06/26/18 07/26/18  [name] Bucy, MD  lamoTRIgine (LAMICTAL) 200 MG tablet Take 200 mg by mouth daily.    [provider]  Multiple Vitamin (MULTIVITAMIN) tablet Take 1 tablet by mouth daily.    [provider]  omeprazole (PRILOSEC) 20 MG capsule Take 1 capsule (20 mg total) by mouth daily. Patient not taking: Reported on 10/14/2018 09/24/18   Melodie Bouillonahiliani, Varnita B, MD  Psyllium 55.46 % POWD Take 1 Scoop by mouth daily. 01/07/18   Pasty Spillersahiliani, Varnita B, MD  risperidone (RISPERDAL) 4 MG tablet Take 1 tablet by mouth every evening. 09/01/18   Garnet KoyanagiJohnson, Julie R, FNP     Allergies Vyvanse [lisdexamfetamine]  Family History  Problem Relation Age of Onset  . COPD Mother   . Hypertension Mother   . Asthma Mother   . Arthritis Mother   . Pancreatic cancer Mother        slow growing  . ADD / ADHD Mother   . Other Mother        Spinal Stenosis - currently in surgery today  . Bipolar disorder Father   . Arthritis Father   . Hypercholesterolemia Father   . Asthma Brother   . Hernia Brother   . ADD / ADHD Brother   . Hypertension Maternal Grandmother   . Heart disease Maternal Grandmother [id number]  . Rheumatic fever Maternal Grandmother   . Heart attack Maternal Grandmother        Bypass Surgery  . Pancreatic cancer Maternal Grandfather   . Liver cancer Maternal Grandfather   . Obesity Paternal Grandmother     Social History Social History   Tobacco Use  . Smoking status: Former Smoker    Packs/day: 0.25    Years: 10.00    Pack years: 2.50    Types: Cigarettes    Start date: 01/01/2008  . Smokeless tobacco: Never Used  Substance Use Topics  . Alcohol use: Yes    Comment: occasionally drink a beer  . Drug use: Not Currently    Types: Marijuana    Comment: cocaine in the past but not currently     Review of Systems Constitutional: No fever/chills Eyes: No visual changes. ENT: No sore throat. Cardiovascular: Denies chest pain. Respiratory: Denies shortness of breath. Gastrointestinal: No abdominal pain.   Genitourinary: Negative for dysuria. Musculoskeletal: Negative for back pain. Skin: Negative for rash. Neurological: Negative for headaches, areas of focal weakness or numbness.    ____________________________________________   PHYSICAL EXAM:  VITAL SIGNS: ED Triage Vitals  Enc Vitals Group     BP 11/20/18 1924 128/67     Pulse Rate 11/20/18 1924 91     Resp 11/20/18 1924 18     Temp 11/20/18 1924 (!) 97.4 F (36.3 C)     Temp Source 11/20/18 1924 Oral     SpO2 11/20/18 1924 97 %     Weight 11/20/18 1923 156 lb 8 oz (71  kg)     Height 11/20/18 1923 4\' 11"  (1.499 m)     Head Circumference --      Peak Flow --      Pain Score 11/20/18 1922 0     Pain Loc --      Pain Edu? --      Excl. in GC? --     Constitutional: Alert and oriented. Well appearing and in no acute distress. Eyes: Conjunctivae are normal. Head: Atraumatic. Nose: No congestion/rhinnorhea. Mouth/Throat: Mucous membranes are moist. Neck: No stridor.  Cardiovascular: Normal rate, regular rhythm. Grossly normal heart sounds.  Good peripheral circulation. Respiratory: Normal respiratory effort.  No retractions. Lungs CTAB. Gastrointestinal: Soft and nontender. No distention. Musculoskeletal: No  lower extremity tenderness nor edema. Neurologic:  Normal speech and language. No gross focal neurologic deficits are appreciated.  Skin:  Skin is warm, dry and intact. No rash noted. Psychiatric: Mood and affect are normal. Speech and behavior are normal.  Is very calm, does not appear aggressive or agitated at all.  ____________________________________________   LABS (all labs ordered are listed, but only abnormal results are displayed)  Labs Reviewed  COMPREHENSIVE METABOLIC PANEL  ETHANOL  CBC  SALICYLATE LEVEL  ACETAMINOPHEN LEVEL  URINE DRUG SCREEN, QUALITATIVE ([facility] ONLY)  POC URINE PREG, ED   ____________________________________________  EKG   ____________________________________________  RADIOLOGY   ____________________________________________   PROCEDURES  Procedure(s) performed: None  Procedures  Critical Care performed: No  ____________________________________________   INITIAL IMPRESSION / ASSESSMENT AND PLAN / ED COURSE  Pertinent labs & imaging results that were available during my care of the patient were reviewed by me and considered in my medical decision making (see chart for details).   Patient presents for evaluation with intermittent episodes of feeling homicidal and suicidal.  She is currently  calm and well collected, denies active ideations but reports these are very situational.  She had them earlier today but have now improved.  She is voluntarily seeking care, and she is calm and not actively endorsing suicidal ideation.  Will place her for a consultation with our TTS team as well as tele-psychiatrist.  We will continue to observe her here in the emergency room, should she start to exhibit concerning symptoms again then I would place her under involuntary commitment, but at this time she does not appear to meet criteria pending psychiatry consult.  Patient in agreement with this plan.  She does request her nortriptyline which she takes in the evening for pain recently prescribed.    ----------------------------------------- 9:52 PM on 11/20/2018 -----------------------------------------  Patient placed under involuntary commitment after being seen by psychiatrist who recommends inpatient stay and IVC.  Additionally, Collier Flowers of security following up with appropriate authorities regarding the patient's homicidal threats and psychiatry recommending this be reported as a duty to warn  TTS consulted.  Patient will be admitted to psychiatry.  IVC paperwork completed.  ____________________________________________   FINAL CLINICAL IMPRESSION(S) / ED DIAGNOSES  Final diagnoses:  Bipolar affective disorder, remission status unspecified (HCC)        Note:  This document was prepared using Dragon voice recognition software and may include unintentional dictation errors       Sharyn Creamer, MD 11/20/18 2255

## 2018-11-20 NOTE — ED Notes (Signed)
Patient with multiple body piercings that she is cooperative with taking them out.  Patient does reports that her nose piecing and two in each ear that she is unable to remove, reports she has tried and is unable to remove them.  Black colored shirt, gray colored bra, black colored jeans, black colored socks, black colored underwear and black colored tennis shoes.    Patient reports she has $100 cash in her bag.  Instructed to let nurse in back know so that it can be locked in hospital safe for security.  All other belongings will be secured on unit.

## 2018-11-20 NOTE — ED Notes (Signed)
TTS SOC machine placed in patients room, TTS contacted, speaking now with patient via [facility] machine.

## 2018-11-20 NOTE — ED Notes (Signed)
SOC called for consult 2014

## 2018-11-20 NOTE — ED Notes (Signed)
Patient's "therapist" is Charlynn Grimes [phone]

## 2018-11-20 NOTE — ED Triage Notes (Signed)
Patient reports having suicidal and homicidal ideations for the past 5 days.  Patient contracts for safety to self and other while here.

## 2018-11-20 NOTE — BH Assessment (Signed)
TTS is available for assessment at any time. Number for machine [phone]

## 2018-11-20 NOTE — ED Notes (Signed)
Pt. Finished SOC, SOC taken out of room.

## 2018-11-20 NOTE — BH Assessment (Signed)
Pt has been evaluated by TTS and SOC. Pt meets criteria for inpatient treatment. Full assessment note to follow. Pt to be admitted to BMU once orders have been secured.

## 2018-11-21 ENCOUNTER — Other Ambulatory Visit: Payer: Self-pay

## 2018-11-21 ENCOUNTER — Inpatient Hospital Stay
Admission: EM | Admit: 2018-11-21 | Discharge: 2018-11-26 | DRG: 885 | Disposition: A | Discharge status: Home / Self Care | Payer: Medicaid Other | Source: Intra-hospital | Attending: Psychiatry | Admitting: Psychiatry

## 2018-11-21 ENCOUNTER — Encounter: Payer: Self-pay | Admitting: Psychiatry

## 2018-11-21 DIAGNOSIS — M419 Scoliosis, unspecified: Secondary | ICD-10-CM | POA: Diagnosis present

## 2018-11-21 DIAGNOSIS — F4312 Post-traumatic stress disorder, chronic: Secondary | ICD-10-CM | POA: Diagnosis not present

## 2018-11-21 DIAGNOSIS — R45851 Suicidal ideations: Secondary | ICD-10-CM | POA: Diagnosis present

## 2018-11-21 DIAGNOSIS — Z62811 Personal history of psychological abuse in childhood: Secondary | ICD-10-CM | POA: Diagnosis present

## 2018-11-21 DIAGNOSIS — Z9049 Acquired absence of other specified parts of digestive tract: Secondary | ICD-10-CM | POA: Diagnosis not present

## 2018-11-21 DIAGNOSIS — F1210 Cannabis abuse, uncomplicated: Secondary | ICD-10-CM | POA: Diagnosis not present

## 2018-11-21 DIAGNOSIS — Z87891 Personal history of nicotine dependence: Secondary | ICD-10-CM

## 2018-11-21 DIAGNOSIS — R45850 Homicidal ideations: Secondary | ICD-10-CM | POA: Diagnosis present

## 2018-11-21 DIAGNOSIS — Z818 Family history of other mental and behavioral disorders: Secondary | ICD-10-CM

## 2018-11-21 DIAGNOSIS — F1220 Cannabis dependence, uncomplicated: Secondary | ICD-10-CM | POA: Diagnosis present

## 2018-11-21 DIAGNOSIS — Z590 Homelessness: Secondary | ICD-10-CM

## 2018-11-21 DIAGNOSIS — Z915 Personal history of self-harm: Secondary | ICD-10-CM | POA: Diagnosis not present

## 2018-11-21 DIAGNOSIS — Z62810 Personal history of physical and sexual abuse in childhood: Secondary | ICD-10-CM | POA: Diagnosis present

## 2018-11-21 DIAGNOSIS — F314 Bipolar disorder, current episode depressed, severe, without psychotic features: Secondary | ICD-10-CM | POA: Diagnosis not present

## 2018-11-21 DIAGNOSIS — F429 Obsessive-compulsive disorder, unspecified: Secondary | ICD-10-CM | POA: Diagnosis present

## 2018-11-21 DIAGNOSIS — F321 Major depressive disorder, single episode, moderate: Principal | ICD-10-CM | POA: Diagnosis present

## 2018-11-21 DIAGNOSIS — F319 Bipolar disorder, unspecified: Secondary | ICD-10-CM | POA: Diagnosis present

## 2018-11-21 DIAGNOSIS — K760 Fatty (change of) liver, not elsewhere classified: Secondary | ICD-10-CM | POA: Diagnosis present

## 2018-11-21 LAB — PREGNANCY, URINE: Preg Test, Ur: NEGATIVE

## 2018-11-21 MED ORDER — MAGNESIUM HYDROXIDE 400 MG/5ML PO SUSP: 30.0000 mL | Freq: Every day | ORAL | Status: DC | PRN

## 2018-11-21 MED ORDER — NORTRIPTYLINE HCL 10 MG PO CAPS
20.0000 mg | ORAL_CAPSULE | Freq: Every day | ORAL | Status: DC
  Administered 2018-11-21 – 2018-11-25 (×5): 20 mg via ORAL
  Filled 2018-11-21 (×7): qty 2

## 2018-11-21 MED ORDER — RISPERIDONE 1 MG PO TABS
4.0000 mg | ORAL_TABLET | Freq: Every evening | ORAL | Status: DC
  Administered 2018-11-21 – 2018-11-25 (×5): 4 mg via ORAL
  Filled 2018-11-21 (×5): qty 4

## 2018-11-21 MED ORDER — HYDROXYZINE HCL 50 MG PO TABS
100.0000 mg | ORAL_TABLET | Freq: Every day | ORAL | Status: DC
  Administered 2018-11-21 – 2018-11-25 (×5): 100 mg via ORAL
  Filled 2018-11-21 (×5): qty 2

## 2018-11-21 MED ORDER — LAMOTRIGINE 100 MG PO TABS
200.0000 mg | ORAL_TABLET | Freq: Every day | ORAL | Status: DC
  Administered 2018-11-21 – 2018-11-26 (×6): 200 mg via ORAL
  Filled 2018-11-21 (×6): qty 2

## 2018-11-21 MED ORDER — LAMOTRIGINE 25 MG PO TABS
50.0000 mg | ORAL_TABLET | Freq: Every day | ORAL | Status: DC
  Administered 2018-11-21: 50 mg via ORAL
  Filled 2018-11-21: qty 2

## 2018-11-21 MED ORDER — ACETAMINOPHEN 325 MG PO TABS: 650.0000 mg | ORAL_TABLET | Freq: Four times a day (QID) | ORAL | Status: DC | PRN

## 2018-11-21 MED ORDER — TRAZODONE HCL 50 MG PO TABS: 50.0000 mg | ORAL_TABLET | Freq: Every evening | ORAL | Status: DC | PRN

## 2018-11-21 MED ORDER — HYDROXYZINE HCL 50 MG PO TABS: 50.0000 mg | ORAL_TABLET | Freq: Every day | ORAL | Status: DC | PRN

## 2018-11-21 MED ORDER — NORTRIPTYLINE HCL 10 MG PO CAPS
10.0000 mg | ORAL_CAPSULE | Freq: Every day | ORAL | Status: DC
  Filled 2018-11-21: qty 1

## 2018-11-21 MED ORDER — RISPERIDONE 1 MG PO TABS: 2.0000 mg | ORAL_TABLET | Freq: Every evening | ORAL | Status: DC

## 2018-11-21 MED ORDER — ALUM & MAG HYDROXIDE-SIMETH 200-200-20 MG/5ML PO SUSP: 30.0000 mL | ORAL | Status: DC | PRN

## 2018-11-21 MED ORDER — ARIPIPRAZOLE 5 MG PO TABS: 15.0000 mg | ORAL_TABLET | Freq: Every day | ORAL | Status: DC

## 2018-11-21 MED ORDER — HYDROXYZINE HCL 50 MG PO TABS: 50.0000 mg | ORAL_TABLET | Freq: Every day | ORAL | Status: DC

## 2018-11-21 NOTE — ED Notes (Signed)
Pt. Has $100.00 in lockup.  Key in pixas (#[phone])

## 2018-11-21 NOTE — [facility] Group Notes (Signed)
LCSW Group Therapy Note 11/21/2018 1:15pm  Type of Therapy and Topic: Group Therapy: Feelings Around Returning Home & Establishing a Supportive Framework and Supporting Oneself When Supports Not Available  Participation Level: Did Not Attend  Description of Group:  Patients first processed thoughts and feelings about upcoming discharge. These included fears of upcoming changes, lack of change, new living environments, judgements and expectations from others and overall stigma of mental health issues. The group then discussed the definition of a supportive framework, what that looks and feels like, and how do to discern it from an unhealthy non-supportive network. The group identified different types of supports as well as what to do when your family/friends are less than helpful or unavailable  Therapeutic Goals  1. Patient will identify one healthy supportive network that they can use at discharge. 2. Patient will identify one factor of a supportive framework and how to tell it from an unhealthy network. 3. Patient able to identify one coping skill to use when they do not have positive supports from others. 4. Patient will demonstrate ability to communicate their needs through discussion and/or role plays.  Summary of Patient Progress:     Therapeutic Modalities Cognitive Behavioral Therapy Motivational Interviewing   Courtney [name]  CUEBAS-COLON, LCSW 11/21/2018 11:12 AM  
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
## 2018-11-21 NOTE — ED Notes (Signed)
Opened her door to visualize pt - she awakened  - introduced myself to her  Plan of care discussed  She is IVC  [facility] referral for inpatient admission  Meal times discussed   Continue to monitor
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
## 2018-11-21 NOTE — Tx Team (Signed)
Initial Treatment Plan 11/21/2018 12:02 PM [name] FXO:[id number]    PATIENT STRESSORS: Financial difficulties Marital or family conflict Other: depression, suicidal thoughts   PATIENT STRENGTHS: Ability for insight Capable of independent living Communication skills Motivation for treatment/growth Physical Health Special hobby/interest Work skills   PATIENT IDENTIFIED PROBLEMS: Suicidal thoughts 11/21/2018  Depression 11/21/2018  Anxiety 11/21/2018  Family conflict 11/21/2018               DISCHARGE CRITERIA:  Adequate post-discharge living arrangements Improved stabilization in mood, thinking, and/or behavior Motivation to continue treatment in a less acute level of care Need for constant or close observation no longer present Verbal commitment to aftercare and medication compliance  PRELIMINARY DISCHARGE PLAN: Outpatient therapy Participate in family therapy Placement in alternative living arrangements  PATIENT/FAMILY INVOLVEMENT: This treatment plan has been presented to and reviewed with the patient, Courtney [name].The patient has been given the opportunity to ask questions and make suggestions.  Lenox Ponds, RN 11/21/2018, 12:02 PM

## 2018-11-21 NOTE — Plan of Care (Signed)
Patient just recently admitted to the unit. Patient has not had sufficient time to show progressions at this time. Will continue to monitor for progressions.   Problem: Education: Goal: Emotional status will improve Outcome: Not Progressing Goal: Mental status will improve Outcome: Not Progressing   Problem: Coping: Goal: Ability to verbalize frustrations and anger appropriately will improve Outcome: Not Progressing   Problem: Safety: Goal: Periods of time without injury will increase Outcome: Not Progressing   Problem: Coping: Goal: Coping ability will improve Outcome: Not Progressing   Problem: Health Behavior/Discharge Planning: Goal: Compliance with therapeutic regimen will improve Outcome: Not Progressing   Problem: Safety: Goal: Ability to disclose and discuss suicidal ideas will improve Outcome: Not Progressing   Problem: Health Behavior/Discharge Planning: Goal: Identification of resources available to assist in meeting health care needs will improve Outcome: Not Progressing
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
## 2018-11-21 NOTE — Progress Notes (Signed)
Patient is pleasant and cooperative, adjusting adequately in the unit , but stayed in her room most of the evening, was up for her medications with compliance, patient states that suicidal thoughts comes and goes but I am not acting on them , patient is aware of positive coping skills, contract for safety,  denies any SI/HI//AVH , encourage patient to verbalize her feeling, and to participate in prescribed social activities wit peers, understood information provided, .no somatic symptoms  And did not request any sleep aid and only requiring 15 minutes safety check

## 2018-11-21 NOTE — ED Notes (Signed)
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
## 2018-11-21 NOTE — BH Assessment (Signed)
Patient is to be admitted to [facility] by Dr. Lenon Ahmadi.  Attending Physician will be Dr. Toni Amend.   Patient has been assigned to room 309, by [facility] Charge Nurse Demetria.   Intake Paper Work has been signed and placed on patient chart.  ER staff is aware of the admission:  French Ana, ER Secretary    Dr. Phineas Real, ER MD   Amy T.  Patient's Nurse   Ivin Booty, Patient Access.

## 2018-11-21 NOTE — ED Notes (Signed)
BEHAVIORAL HEALTH ROUNDING Patient sleeping: Yes.   Patient alert and oriented: eyes closed   Behavior appropriate: Yes.  ; If no, describe:  Nutrition and fluids offered: Yes  Toileting and hygiene offered: sleeping Sitter present: q 15 minute observations and security monitoring Law enforcement present: yes  ODS 

## 2018-11-21 NOTE — ED Notes (Signed)
ED  Is the patient under IVC or is there intent for IVC: Yes.   Is the patient medically cleared: Yes.   Is there vacancy in the ED BHU: Yes.   Is the population mix appropriate for patient: Yes.   Is the patient awaiting placement in inpatient or outpatient setting: Yes.  inpatient treatment    Has the patient had a psychiatric consult: Yes.   SOC Survey of unit performed for contraband, proper placement and condition of furniture, tampering with fixtures in bathroom, shower, and each patient room: Yes.  ; Findings:  APPEARANCE/BEHAVIOR Calm and cooperative NEURO ASSESSMENT Orientation: oriented x3   Hallucinations: No.None noted (Hallucinations)  Denies  Speech: Normal Gait: normal RESPIRATORY ASSESSMENT Even  Unlabored respirations  CARDIOVASCULAR ASSESSMENT Pulses equal   regular rate  Skin warm and dry   GASTROINTESTINAL ASSESSMENT no GI complaint EXTREMITIES Full ROM  PLAN OF CARE Provide calm/safe environment. Vital signs assessed twice daily. ED BHU Assessment once each 12-hour shift. Collaborate with TTS when available  or as condition indicates. Assure the ED provider has rounded once each shift. Provide and encourage hygiene. Provide redirection as needed. Assess for escalating behavior; address immediately and inform ED provider.  Assess family dynamic and appropriateness for visitation as needed: Yes.  ; If necessary, describe findings:  Educate the patient/family about BHU procedures/visitation: Yes.  ; If necessary, describe findings:

## 2018-11-21 NOTE — ED Notes (Signed)
KEY from [employer]  - key #[id number] sent to [facility]  Pt with valuables in the hospital safe

## 2018-11-21 NOTE — Progress Notes (Signed)
D: Received patient from [facility] Emergency Department. Patient skin assessment completed with Demetria, RN, skin is intact with some minor bruising noted (see flowsheets), no contraband found with all unit prohibited items locked and stored away for discharge. Pt. Was admitted under the services of, Dr. Toni Amend.   Patient during the admissions processes is pleasant and cooperative, engages good. Pt. Able to be complaint with completing all of admissions. Pt. During interviewing endorses passive suicidal thoughts, but able to contract for safety on the unit. Pt. Express she has been feeling suicidal do to now being homeless (asked not to return to parents home) and this is a major stressor for her. Pt. Does not identify any specific plans and/or intent to harm self, just states, "sometimes" for most answers. Pt. Also is feeling homicidal and angry towards family members due to verbal agruements with family and recent request from family to not return home, making her homeless. Pt. Endorses her mood as mostly depressed and anxious, rating these at, "7/10" for both. Pt. Denies physical pain, just upset with her situation in life.   A: Patient oriented to unit/room/call light. Pt. Given extensive admissions education. Patient was encourage to participate in unit activities and continue with plan of care being put into place. Q x 15 minute observation checks were initiated for safety. Pt. Given lunch to eat.   R: Patient is receptive to treatment plan being put into place and safety to be maintained on unit per MD orders.

## 2018-11-22 DIAGNOSIS — F321 Major depressive disorder, single episode, moderate: Principal | ICD-10-CM

## 2018-11-22 LAB — LIPID PANEL
Cholesterol: 118 mg/dL (ref 0–200)
HDL: 73 mg/dL (ref >40)
LDL Cholesterol: 39 mg/dL (ref 0–99)
Total CHOL/HDL Ratio: 1.6 RATIO
Triglycerides: 31 mg/dL (ref <150)
VLDL: 6 mg/dL (ref 0–40)

## 2018-11-22 NOTE — BHH Counselor (Signed)
CSW provided pt with a list of boarding houses, per pt request.

## 2018-11-22 NOTE — Progress Notes (Signed)
Recreation Therapy Notes  INPATIENT RECREATION THERAPY ASSESSMENT  Patient Details Name: Courtney [name] MRN: [id number] DOB: 08/13/1990 Today's Date: 11/22/2018       Information Obtained From: Patient  Able to Participate in Assessment/Interview: Yes  Patient Presentation: Responsive  Reason for Admission (Per Patient): Active Symptoms, Suicidal Ideation  Patient Stressors: Family  Coping Skills:   Journal, Write, TV, Music, Exercise, Talk  Leisure Interests (2+):  Music - Listen, Individual - TV, Social - Friends  Frequency of Recreation/Participation: Monthly  Awareness of Community Resources:  Yes  Community Resources:  [employer]  Current Use:    If no, Barriers?:    Expressed Interest in [employer] Information:    [place] of Residence:  Production manager  Patient Main Form of Transportation: Other (Comment)(Parents)  Patient Strengths:  empathetic  Patient Identified Areas of Improvement:  Be more considerate, less selfish, open up more and less profanity  Patient Goal for Hospitalization:  Have someone to talk to and find housing  Current SI (including self-harm):  No  Current HI:  No  Current AVH: No  Staff Intervention Plan: Group Attendance, Collaborate with Interdisciplinary Treatment Team  Consent to Intern Participation: N/A  Ignatz Deis 11/22/2018, 3:24 PM

## 2018-11-22 NOTE — BHH Counselor (Signed)
CSW left VM for Marcelino Duster Murdock(therapist, [facility]) to coordinate discharge follow up and gain insight on pt progress. Pt states she has an ACT team provided by [facility].

## 2018-11-22 NOTE — [facility] Suicide Risk Assessment (Signed)
[facility] INPATIENT:  Family/Significant Other Suicide Prevention Education  Suicide Prevention Education:  Education Completed; Courtney [name], mother [phone]) has been identified by the patient as the family member/significant other with whom the patient will be residing, and identified as the person(s) who will aid the patient in the event of a mental health crisis (suicidal ideations/suicide attempt).  With written consent from the patient, the family member/significant other has been provided the following suicide prevention education, prior to the and/or following the discharge of the patient.  The suicide prevention education provided includes the following:  Suicide risk factors  Suicide prevention and interventions  National Suicide Hotline telephone number  [facility] assessment telephone number  [facility] Emergency Assistance 911  [facility] and/or Residential Mobile Crisis Unit telephone number  Request made of family/significant other to:  Remove weapons (e.g., guns, rifles, knives), all items previously/currently identified as safety concern.    Remove drugs/medications (over-the-counter, prescriptions, illicit drugs), all items previously/currently identified as a safety concern.  The family member/significant other verbalizes understanding of the suicide prevention education information provided.  The family member/significant other agrees to remove the items of safety concern listed above. Mrs. Maxey reports the pt cannot return to her home. She says the pt moved in Nov. 2019 once she was released from jail. She raised concern about the pt not following her house rules and for this reason she will not be allowed to return. She further states concern about the pt smoking marijuana in the home, negative peer influence and inappropriate behavior on social media. Mrs. Maxey encouraged CSW to contact pt's therapist to assist with finding the pt  housing.  Courtney [name] 11/22/2018, 10:00 AM

## 2018-11-22 NOTE — Tx Team (Addendum)
Interdisciplinary Treatment and Diagnostic Plan Update  11/22/2018 Time of Session: 230p Courtney [name] MRN: [id number]  Principal Diagnosis: <principal problem not specified>  Secondary Diagnoses: Active Problems:   Chronic post-traumatic stress disorder (PTSD)   Bipolar disorder (HCC)   Cannabis abuse   Current Medications:  Current Facility-Administered Medications  Medication Dose Route Frequency Provider Last Rate Last Dose  . acetaminophen (TYLENOL) tablet 650 mg  650 mg Oral Q6H PRN Thalia Party, MD      . alum & mag hydroxide-simeth (MAALOX/MYLANTA) 200-200-20 MG/5ML suspension 30 mL  30 mL Oral Q4H PRN Paliy, Alisa, MD      . hydrOXYzine (ATARAX/VISTARIL) tablet 100 mg  100 mg Oral QHS Darliss Ridgel, MD   100 mg at 11/21/18 2115  . hydrOXYzine (ATARAX/VISTARIL) tablet 50 mg  50 mg Oral Daily PRN Darliss Ridgel, MD      . lamoTRIgine (LAMICTAL) tablet 200 mg  200 mg Oral Daily Darliss Ridgel, MD   200 mg at 11/22/18 0820  . magnesium hydroxide (MILK OF MAGNESIA) suspension 30 mL  30 mL Oral Daily PRN Thalia Party, MD      . nortriptyline (PAMELOR) capsule 20 mg  20 mg Oral QHS Darliss Ridgel, MD   20 mg at 11/21/18 2124  . risperiDONE (RISPERDAL) tablet 4 mg  4 mg Oral QPM Darliss Ridgel, MD   4 mg at 11/21/18 1705   PTA Medications: Medications Prior to Admission  Medication Sig Dispense Refill Last Dose  . ABILIFY MAINTENA 400 MG SRER injection INJ 1 ML IM MONTHLY   Unknown at Unknown  . ARIPiprazole (ABILIFY) 20 MG tablet Take 1 tablet by mouth at bedtime.  0 Not Taking at Unknown time  . CONCERTA 36 MG CR tablet Take 1 tablet by mouth daily.  0 Unknown at Unknown  . hydrOXYzine (ATARAX/VISTARIL) 50 MG tablet Take 100 mg by mouth at bedtime.    Unknown at Unknown  . lamoTRIgine (LAMICTAL) 100 MG tablet Take 2 tablets (200 mg total) by mouth daily. (Patient not taking: Reported on 11/21/2018) 60 tablet 0 Not Taking at Unknown  . lamoTRIgine (LAMICTAL) 200 MG tablet Take  200 mg by mouth daily.   Unknown at Unknown  . Multiple Vitamin (MULTIVITAMIN) tablet Take 1 tablet by mouth daily.   Unknown at Unknown  . nortriptyline (PAMELOR) 10 MG capsule Start Nortriptyline (Pamelor) 10 mg nightly for one week, then increase to 20 mg nightly   Unknown at Unknown  . omeprazole (PRILOSEC) 20 MG capsule Take 1 capsule (20 mg total) by mouth daily. (Patient not taking: Reported on 10/14/2018) 30 capsule 3 Not Taking at Unknown time  . Psyllium 55.46 % POWD Take 1 Scoop by mouth daily. 1 Bottle 1 Unknown at Unknown  . risperidone (RISPERDAL) 4 MG tablet Take 1 tablet by mouth every evening.   Unknown at Unknown    Patient Stressors: Financial difficulties Marital or family conflict Other: depression, suicidal thoughts  Patient Strengths: Ability for insight Capable of independent living Manufacturing systems engineer Motivation for treatment/growth Physical Health Special hobby/interest Work skills  Treatment Modalities: Medication Management, Group therapy, Case management,  1 to 1 session with clinician, Psychoeducation, Recreational therapy.   Physician Treatment Plan for Primary Diagnosis: <principal problem not specified> Long Term Goal(s): Improvement in symptoms so as ready for discharge Improvement in symptoms so as ready for discharge   Short Term Goals: Ability to identify changes in lifestyle to reduce recurrence of condition will improve Ability to  verbalize feelings will improve Ability to disclose and discuss suicidal ideas Ability to demonstrate self-control will improve Ability to identify and develop effective coping behaviors will improve Ability to maintain clinical measurements within normal limits will improve Compliance with prescribed medications will improve Ability to identify triggers associated with substance abuse/mental health issues will improve Ability to identify changes in lifestyle to reduce recurrence of condition will improve Ability to  verbalize feelings will improve Ability to disclose and discuss suicidal ideas Ability to demonstrate self-control will improve Ability to identify and develop effective coping behaviors will improve Ability to maintain clinical measurements within normal limits will improve Compliance with prescribed medications will improve Ability to identify triggers associated with substance abuse/mental health issues will improve  Medication Management: Evaluate patient's response, side effects, and tolerance of medication regimen.  Therapeutic Interventions: 1 to 1 sessions, Unit Group sessions and Medication administration.  Evaluation of Outcomes: Progressing  Physician Treatment Plan for Secondary Diagnosis: Active Problems:   Chronic post-traumatic stress disorder (PTSD)   Bipolar disorder (HCC)   Cannabis abuse  Long Term Goal(s): Improvement in symptoms so as ready for discharge Improvement in symptoms so as ready for discharge   Short Term Goals: Ability to identify changes in lifestyle to reduce recurrence of condition will improve Ability to verbalize feelings will improve Ability to disclose and discuss suicidal ideas Ability to demonstrate self-control will improve Ability to identify and develop effective coping behaviors will improve Ability to maintain clinical measurements within normal limits will improve Compliance with prescribed medications will improve Ability to identify triggers associated with substance abuse/mental health issues will improve Ability to identify changes in lifestyle to reduce recurrence of condition will improve Ability to verbalize feelings will improve Ability to disclose and discuss suicidal ideas Ability to demonstrate self-control will improve Ability to identify and develop effective coping behaviors will improve Ability to maintain clinical measurements within normal limits will improve Compliance with prescribed medications will  improve Ability to identify triggers associated with substance abuse/mental health issues will improve     Medication Management: Evaluate patient's response, side effects, and tolerance of medication regimen.  Therapeutic Interventions: 1 to 1 sessions, Unit Group sessions and Medication administration.  Evaluation of Outcomes: Progressing   RN Treatment Plan for Primary Diagnosis: <principal problem not specified> Long Term Goal(s): Knowledge of disease and therapeutic regimen to maintain health will improve  Short Term Goals: Ability to participate in decision making will improve, Ability to verbalize feelings will improve, Ability to disclose and discuss suicidal ideas, Ability to identify and develop effective coping behaviors will improve and Compliance with prescribed medications will improve  Medication Management: RN will administer medications as ordered by provider, will assess and evaluate patient's response and provide education to patient for prescribed medication. RN will report any adverse and/or side effects to prescribing provider.  Therapeutic Interventions: 1 on 1 counseling sessions, Psychoeducation, Medication administration, Evaluate responses to treatment, Monitor vital signs and CBGs as ordered, Perform/monitor CIWA, COWS, AIMS and Fall Risk screenings as ordered, Perform wound care treatments as ordered.  Evaluation of Outcomes: Progressing   LCSW Treatment Plan for Primary Diagnosis: <principal problem not specified> Long Term Goal(s): Safe transition to appropriate next level of care at discharge, Engage patient in therapeutic group addressing interpersonal concerns.  Short Term Goals: Engage patient in aftercare planning with referrals and resources  Therapeutic Interventions: Assess for all discharge needs, 1 to 1 time with Social worker, Explore available resources and support systems, Assess for adequacy in  community support network, Educate family and  significant other(s) on suicide prevention, Complete Psychosocial Assessment, Interpersonal group therapy.  Evaluation of Outcomes: Progressing   Progress in Treatment: Attending groups: Yes. Participating in groups: Yes. Taking medication as prescribed: Yes. Toleration medication: Yes. Family/Significant other contact made: Yes, individual(s) contacted:  [name], mother Patient understands diagnosis: Yes. Discussing patient identified problems/goals with staff: Yes. Medical problems stabilized or resolved: Yes. Denies suicidal/homicidal ideation: Yes. Issues/concerns per patient self-inventory: No. Other: NA  New problem(s) identified: No, Describe:  none reported  New Short Term/Long Term Goal(s): "Get advice on how to look at things in a more optimistic way" Patient Goals:  "Get advice on how to look at things in a more optimistic way"  Discharge Plan or Barriers: Pt cannot return to her parents home, request information on boarding houses. Pt will follow up with [facility] where she receives outpatient treatment  Reason for Continuation of Hospitalization: Medication stabilization  Estimated Length of Stay: 3-5 days  Recreational Therapy: Patient Stressors: N/A Patient Goal: Patient will engage in groups without prompting or encouragement from LRT x3 group sessions within 5 recreation therapy group sessions  Attendees: Patient:Courtney [name] 11/22/2018 3:16 PM  Physician: Mordecai Rasmussen 11/22/2018 3:16 PM  Nursing: Curt Bears Chisem 11/22/2018 3:16 PM  RN Care Manager: 11/22/2018 3:16 PM  Social Worker: Lowella Dandy Sharon Moton 11/22/2018 3:16 PM  Recreational Therapist: Garret Reddish 11/22/2018 3:16 PM  Other:  11/22/2018 3:16 PM  Other:  11/22/2018 3:16 PM  Other: 11/22/2018 3:16 PM    Scribe for Treatment Team: Suzan Slick, LCSW 11/22/2018 3:16 PM
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
## 2018-11-22 NOTE — [facility] Group Notes (Signed)
LCSW Group Therapy Note   11/22/2018 1:00 PM  Type of Therapy and Topic:  Group Therapy:  Overcoming Obstacles   Participation Level:  Active   Description of Group:    In this group patients will be encouraged to explore what they see as obstacles to their own wellness and recovery. They will be guided to discuss their thoughts, feelings, and behaviors related to these obstacles. The group will process together ways to cope with barriers, with attention given to specific choices patients can make. Each patient will be challenged to identify changes they are motivated to make in order to overcome their obstacles. This group will be process-oriented, with patients participating in exploration of their own experiences as well as giving and receiving support and challenge from other group members.   Therapeutic Goals: 1. Patient will identify personal and current obstacles as they relate to admission. 2. Patient will identify barriers that currently interfere with their wellness or overcoming obstacles.  3. Patient will identify feelings, thought process and behaviors related to these barriers. 4. Patient will identify two changes they are willing to make to overcome these obstacles:      Summary of Patient Progress Patient was present and attentive in group.  Patient identified that her biggest obstacles as homelessness and instability. Patient reports feelings that her family "is tired of me".  Patient participated in how to address the obstacles such as reaching out to supports, delegating tasks, being open to change, pets, spending time with family and being consistent with medications.  Patient participated in discussion on coping skills identifying that making jewelry has helped her to cope in the past.     Therapeutic Modalities:   Cognitive Behavioral Therapy Solution Focused Therapy Motivational Interviewing Relapse Prevention Therapy  Penni Homans, MSW, LCSW 11/22/2018 2:37 PM

## 2018-11-22 NOTE — Plan of Care (Signed)
Patient continues to endorse some passive ideations but states, "I feel safe here." Patient  expressed some depression and patient was encouraged to go out and attend some group sessions today instead of being isolative to her room and sleeping all day. Patient stated that she would make every effort to getting up and going. Compliant with her treatment plan, milieu remains safe with q 15 minute safety checks. Will continue to monitor.

## 2018-11-22 NOTE — Progress Notes (Signed)
Recreation Therapy Notes  Date: 11/22/2018  Time: 9:30 am   Location: Craft room   Behavioral response: N/A   Intervention Topic: Strengths  Discussion/Intervention: Patient did not attend group.   Clinical Observations/Feedback:  Patient did not attend group.   Akshita Italiano LRT/CTRS        Aubreigh Fuerte 11/22/2018 10:57 AM 

## 2018-11-23 LAB — HEMOGLOBIN A1C
Hgb A1c MFr Bld: 5.5 % (ref 4.8–5.6)
Mean Plasma Glucose: 111 mg/dL

## 2018-11-23 NOTE — Progress Notes (Signed)
Patient is sleeping well and maintaining adequate safety guide lines. Denies any SI,HI,AVH , expresses no physical  concerns as patient takes her P.M medication, patient said I am very safe in here. Support and encouragement is provided, patient only require 15 minutes rounding for safety no distress.

## 2018-11-23 NOTE — [facility] Counselor (Signed)
CSW spoke with Ellamae Sia, case manager/SA specialist at [facility]. Zella Ball reports the pt is receiving CST and they have been working with her for 3 mths to assist her with finding  housing. Nevertheless, Zella Ball says they were pursuing a group home but the pt declined, stating she wants independence. CSW informed her the pt was given a list of boarding houses but has concern about the weekly/monthly cost. Zella Ball states she will meet with Rudean Hitt house owner) on 4/15 to inspect his property, should the pt decide to go there. Zella Ball raised concern about the treatment the pt was experiencing while living in her parents home. She describes the parents as being "controlling and treating her like a child." she says the parents monitor everything she does and leave her very little room for error. She reports pt's mother is her payee. Zella Ball states she will follow up with CSW after she meets with Merceda Elks.

## 2018-11-23 NOTE — Progress Notes (Signed)
Recreation Therapy Notes  Date: 11/23/2018  Time: 9:30 am  Location: Craft Room  Behavioral response: Appropriate  Intervention Topic: Necessities  Discussion/Intervention:  Group content on today was focused on necessities. The group defined necessities and how they determine their necessities. Individuals expressed how many necessities they have and if it changes from day to day. Patients described the difference between wants and needs. The group explained how they have overspent on wants in the past. Individuals described a reoccurring necessity for them. The intervention "What I need" helped patients differentiate between wants and needs.  Clinical Observations/Feedback:  Patient came to group late and identified animal food and her bills are reoccurring expenses she has. Individual was social with peers and staff while participating in the intervention. Marcelle Bebout LRT/CTRS         Ivor Kishi 11/23/2018 11:55 AM

## 2018-11-23 NOTE — [facility] Counselor (Signed)
CSW left vm at 1024am for Charlynn Grimes, therapist at [facility]. CSW attempted to contact Ellamae Sia at 1021am, case Production designer, theatre/television/film at [employer]  per pt request. Unable to leave vm due to mailbox being full.

## 2018-11-23 NOTE — [facility] Group Notes (Signed)
Feelings Around Diagnosis 11/23/2018 1PM  Type of Therapy/Topic:  Group Therapy:  Feelings about Diagnosis  Participation Level:  Active   Description of Group:   This group will allow patients to explore their thoughts and feelings about diagnoses they have received. Patients will be guided to explore their level of understanding and acceptance of these diagnoses. Facilitator will encourage patients to process their thoughts and feelings about the reactions of others to their diagnosis and will guide patients in identifying ways to discuss their diagnosis with significant others in their lives. This group will be process-oriented, with patients participating in exploration of their own experiences, giving and receiving support, and processing challenge from other group members.   Therapeutic Goals: 1. Patient will demonstrate understanding of diagnosis as evidenced by identifying two or more symptoms of the disorder 2. Patient will be able to express two feelings regarding the diagnosis 3. Patient will demonstrate their ability to communicate their needs through discussion and/or role play  Summary of Patient Progress:  Actively and appropriately engaged in the group. Patient was able to provide support and validation to other group members.Patient practiced active listening when interacting with the facilitator and other group members. Patient identified feeling anxious and worried about her living situation. Patient demonstrated insight and respected boundaries.      Therapeutic Modalities:   Cognitive Behavioral Therapy Brief Therapy Feelings Identification    Suzan Slick, LCSW 11/23/2018 2:45 PM

## 2018-11-23 NOTE — Plan of Care (Signed)
Patient is alert and oriented X 4, denies SI, HI and AVH today. Patient is pleasant, soft spoken. Patient has been out in the milieu today present in the dayroom and in groups. Patient is eating and sleeping well and appropriately interacting with peers on the unit. No self harming behaviors observed. Problem: Education: Goal: Emotional status will improve Outcome: Progressing Goal: Mental status will improve Outcome: Progressing   Problem: Coping: Goal: Ability to verbalize frustrations and anger appropriately will improve Outcome: Progressing   Problem: Safety: Goal: Periods of time without injury will increase Outcome: Progressing   Problem: Coping: Goal: Coping ability will improve Outcome: Progressing   Problem: Health Behavior/Discharge Planning: Goal: Compliance with therapeutic regimen will improve Outcome: Progressing

## 2018-11-23 NOTE — Progress Notes (Signed)
[facility] MD Progress Note  11/23/2018 1:33 PM Courtney [name]  MRN:  [id number] Subjective: Patient seen and chart reviewed.  Patient with a history of depression and chronic impulsivity and suicidal ideation.  Patient today says she is feeling significantly better.  Mood is more hopeful and optimistic.  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  Affect upbeat.  No sign of psychosis.  Denies any suicidal ideation.  No medical complaints. Principal Problem: Moderate major depression (HCC) Diagnosis: Principal Problem:   Moderate major depression (HCC) Active Problems:   Chronic post-traumatic stress disorder (PTSD)   Bipolar disorder (HCC)   Cannabis abuse  Total Time spent with patient: 30 minutes  Past Psychiatric History: Patient has a history of chronic mental health problems and has had a large number of hospitalizations over the time.  She has a history of self injury.  Past Medical History:  Past Medical History:  Diagnosis Date  . ADHD    As a child  . Anxiety   . Bipolar 1 disorder (HCC)   . Depression   . Fatty liver   . OCD (obsessive compulsive disorder)   . PTSD (post-traumatic stress disorder)   . Pyloric stenosis     Past Surgical History:  Procedure Laterality Date  . APPENDECTOMY  02/17/2014  . COLONOSCOPY WITH PROPOFOL N/A 09/03/2018   Procedure: COLONOSCOPY WITH PROPOFOL;  Surgeon: Pasty Spillers, MD;  Location: [facility] ENDOSCOPY;  Service: Endoscopy;  Laterality: N/A;  . ESOPHAGOGASTRODUODENOSCOPY (EGD) WITH PROPOFOL N/A 09/03/2018   Procedure: ESOPHAGOGASTRODUODENOSCOPY (EGD) WITH PROPOFOL;  Surgeon: Pasty Spillers, MD;  Location: [facility] ENDOSCOPY;  Service: Endoscopy;  Laterality: N/A;  . EUS N/A 10/14/2018   Procedure: FULL UPPER ENDOSCOPIC ULTRASOUND (EUS) RADIAL;  Surgeon: Rayann Heman, MD;  Location: [facility] ENDOSCOPY;  Service: Endoscopy;  Laterality: N/A;  . pyloric stenosis repair      Family History:  Family History  Problem Relation Age of Onset  . COPD Mother   . Hypertension Mother   . Asthma Mother   . Arthritis Mother   . Pancreatic cancer Mother        slow growing  . ADD / ADHD Mother   . Other Mother        Spinal Stenosis - currently in surgery today  . Bipolar disorder Father   . Arthritis Father   . Hypercholesterolemia Father   . Asthma Brother   . Hernia Brother   . ADD / ADHD Brother   . Hypertension Maternal Grandmother   . Heart disease Maternal Grandmother 32  . Rheumatic fever Maternal Grandmother   . Heart attack Maternal Grandmother        Bypass Surgery  . Pancreatic cancer Maternal Grandfather   . Liver cancer Maternal Grandfather   . Obesity Paternal Grandmother    Family Psychiatric  History: None reported except for ADHD and a brother Social History:  Social History   Substance and Sexual Activity  Alcohol Use Yes   Comment: occasionally drink a beer     Social History   Substance and Sexual Activity  Drug Use Not Currently  . Types: Marijuana   Comment: cocaine in the past but not currently     Social History   Socioeconomic History  . Marital status: Single    Spouse name: Not on file  . Number of children: 0  . Years of education: Not on file  . Highest education  level: Not on file  Occupational History  . Occupation: disability     Comment: mental health   Social Needs  . Financial resource strain: Somewhat hard  . Food insecurity:    Worry: Never true    Inability: Never true  . Transportation needs:    Medical: No    Non-medical: No  Tobacco Use  . Smoking status: Former Smoker    Packs/day: 0.25    Years: 10.00    Pack years: 2.50    Types: Cigarettes    Start date: 01/01/2008  . Smokeless tobacco: Never Used  Substance and Sexual Activity  . Alcohol use: Yes    Comment: occasionally drink a beer  . Drug use: Not Currently    Types: Marijuana    Comment: cocaine in the past but not  currently   . Sexual activity: Yes    Partners: Male    Birth control/protection: Condom  Lifestyle  . Physical activity:    Days per week: 5 days    Minutes per session: 60 min  . Stress: Rather much  Relationships  . Social connections:    Talks on phone: More than three times a week    Gets together: Three times a week    Attends religious service: More than 4 times per year    Active member of club or organization: Yes    Attends meetings of clubs or organizations: More than 4 times per year    Relationship status: Never married  Other Topics Concern  . Not on file  Social History Narrative   Moved to [place] from [facility] to be closer to family. Parents moved here in 2018.   On disability for bipolar disorder since young age, had to be placed in a group home and foster home because of behavior. She is now living with parents since she decided to take her medications.    Additional Social History:                         Sleep: Fair  Appetite:  Fair  Current Medications: Current Facility-Administered Medications  Medication Dose Route Frequency Provider Last Rate Last Dose  . acetaminophen (TYLENOL) tablet 650 mg  650 mg Oral Q6H PRN Thalia Party, MD      . alum & mag hydroxide-simeth (MAALOX/MYLANTA) 200-200-20 MG/5ML suspension 30 mL  30 mL Oral Q4H PRN Paliy, Alisa, MD      . hydrOXYzine (ATARAX/VISTARIL) tablet 100 mg  100 mg Oral QHS Darliss Ridgel, MD   100 mg at 11/22/18 2144  . hydrOXYzine (ATARAX/VISTARIL) tablet 50 mg  50 mg Oral Daily PRN Darliss Ridgel, MD      . lamoTRIgine (LAMICTAL) tablet 200 mg  200 mg Oral Daily Darliss Ridgel, MD   200 mg at 11/23/18 0814  . magnesium hydroxide (MILK OF MAGNESIA) suspension 30 mL  30 mL Oral Daily PRN Thalia Party, MD      . nortriptyline (PAMELOR) capsule 20 mg  20 mg Oral QHS Darliss Ridgel, MD   20 mg at 11/22/18 2143  . risperiDONE (RISPERDAL) tablet 4 mg  4 mg Oral QPM Darliss Ridgel, MD   4 mg at 11/22/18 1702     Lab Results: No results found for this or any previous visit (from the past 48 hour(s)).  Blood Alcohol level:  Lab Results  Component Value Date   ETH <10 11/20/2018   ETH <10 02/27/2018  Metabolic Disorder Labs: Lab Results  Component Value Date   HGBA1C 5.5 11/20/2018   MPG 111 11/20/2018   MPG 111 07/20/2018   No results found for: PROLACTIN Lab Results  Component Value Date   CHOL 118 11/20/2018   TRIG 31 11/20/2018   HDL 73 11/20/2018   CHOLHDL 1.6 11/20/2018   VLDL 6 11/20/2018   LDLCALC 39 11/20/2018   LDLCALC 30 02/24/2018    Physical Findings: AIMS:  , ,  ,  ,    CIWA:    COWS:     Musculoskeletal: Strength & Muscle Tone: within normal limits Gait & Station: normal Patient leans: N/A  Psychiatric Specialty Exam: Physical Exam  Nursing note and vitals reviewed. Constitutional: She appears well-developed and well-nourished.  HENT:  Head: Normocephalic and atraumatic.  Eyes: Pupils are equal, round, and reactive to light. Conjunctivae are normal.  Neck: Normal range of motion.  Cardiovascular: Regular rhythm and normal heart sounds.  Respiratory: Effort normal.  GI: Soft.  Musculoskeletal: Normal range of motion.  Neurological: She is alert.  Skin: Skin is warm and dry.  Psychiatric: She has a normal mood and affect. Her behavior is normal. Judgment and thought content normal.    Review of Systems  Constitutional: Negative.   HENT: Negative.   Eyes: Negative.   Respiratory: Negative.   Cardiovascular: Negative.   Gastrointestinal: Negative.   Musculoskeletal: Negative.   Skin: Negative.   Neurological: Negative.   Psychiatric/Behavioral: Negative.     Blood pressure 109/65, pulse 91, temperature 98.2 F (36.8 C), temperature source Oral, resp. rate 18, height 4\' 11"  (1.499 m), weight 71 kg, last menstrual period 11/06/2018, SpO2 99 %.Body mass index is 31.61 kg/m.  General Appearance: Casual  Eye Contact:  Good  Speech:  Clear and  Coherent  Volume:  Normal  Mood:  Euthymic  Affect:  Appropriate  Thought Process:  Goal Directed  Orientation:  Full (Time, Place, and Person)  Thought Content:  Logical  Suicidal Thoughts:  No  Homicidal Thoughts:  No  Memory:  Immediate;   Fair Recent;   Fair Remote;   Fair  Judgement:  Fair  Insight:  Fair  Psychomotor Activity:  Normal  Concentration:  Concentration: Fair  Recall:  [employer] of Knowledge:  Fair  Language:  Fair  Akathisia:  No  Handed:  Right  AIMS (if indicated):     Assets:  Communication Skills Desire for Improvement Physical Health Resilience Social Support  ADL's:  Intact  Cognition:  WNL  Sleep:  Number of Hours: 7.45     Treatment Plan Summary: Daily contact with patient to assess and evaluate symptoms and progress in treatment, Medication management and Plan Patient seems to bounce back and is feeling much better.  No longer having any active suicidal thoughts.  Cooperative with treatment.  Asks appropriate questions and interacts appropriately.  She is searching for some kind of better living situation.  Social work has been working with her.  No indication to change any medicine at this point.  Anticipated length of stay of no more than another day probably.  Encourage patient and her group attendance.  Psychoeducation and supportive counseling done.  Mordecai Rasmussen, MD 11/23/2018, 1:33 PM

## 2018-11-24 MED ORDER — RISPERIDONE 4 MG PO TABS: 4.0000 mg | ORAL_TABLET | Freq: Every evening | ORAL | 0 refills | Status: DC

## 2018-11-24 MED ORDER — NORTRIPTYLINE HCL 10 MG PO CAPS: 20.0000 mg | ORAL_CAPSULE | Freq: Every day | ORAL | 0 refills | Status: DC

## 2018-11-24 MED ORDER — LAMOTRIGINE 200 MG PO TABS: 200.0000 mg | ORAL_TABLET | Freq: Every day | ORAL | 0 refills | Status: DC

## 2018-11-24 NOTE — Plan of Care (Signed)
Patient stated she was feeling better but still had a little depression because she was homeless and doesn't know where she will be going when gets out of here.   Problem: Education: Goal: Emotional status will improve Outcome: Progressing Goal: Mental status will improve Outcome: Progressing

## 2018-11-24 NOTE — Plan of Care (Signed)
Patient is alert and oriented X 4,denies SI,HI and AVH. Patient is pleasant, animated when talking with staff and peers on the unit. Patient morning medication was given later because patient wanted to sleep this morning. Patient has no complaints, interaction is appropriate on the unit, sleeping and eating well. No self harming behaviors noted. Safety checks Q 15 minutes. Problem: Education: Goal: Emotional status will improve Outcome: Progressing Goal: Mental status will improve Outcome: Progressing   Problem: Coping: Goal: Ability to verbalize frustrations and anger appropriately will improve Outcome: Progressing   Problem: Safety: Goal: Periods of time without injury will increase Outcome: Progressing   Problem: Coping: Goal: Coping ability will improve Outcome: Progressing

## 2018-11-24 NOTE — [facility] Group Notes (Signed)
LCSW Group Therapy Note  11/24/2018 11:46 AM  Type of Therapy/Topic:  Group Therapy:  Emotion Regulation  Participation Level:  Active   Description of Group:   The purpose of this group is to assist patients in learning to regulate negative emotions and experience positive emotions. Patients will be guided to discuss ways in which they have been vulnerable to their negative emotions. These vulnerabilities will be juxtaposed with experiences of positive emotions or situations, and patients will be challenged to use positive emotions to combat negative ones. Special emphasis will be placed on coping with negative emotions in conflict situations, and patients will process healthy conflict resolution skills.  Therapeutic Goals: 1. Patient will identify two positive emotions or experiences to reflect on in order to balance out negative emotions 2. Patient will label two or more emotions that they find the most difficult to experience 3. Patient will demonstrate positive conflict resolution skills through discussion and/or role plays  Summary of Patient Progress: Pt was appropriate and respectful in group. Pt was able to identify various feelings and emotions she has been feeling. Pt reported that she voluntarily admitted herself to the unit because she felt she needed help. Pt reported that she was feeling alone prior to coming to the hospital and at the beginning of her stay, but now she feels better and like she has more supports from friends.   Therapeutic Modalities:   Cognitive Behavioral Therapy Feelings Identification Dialectical Behavioral Therapy   Iris Pert, MSW, LCSW Clinical Social Work 11/24/2018 11:46 AM

## 2018-11-24 NOTE — Progress Notes (Signed)
Recreation Therapy Notes  Date: 11/24/2018  Time: 9:30 am   Location: Craft room   Behavioral response: N/A   Intervention Topic: Relaxation    Discussion/Intervention: Patient did not attend group.   Clinical Observations/Feedback:  Patient did not attend group.   Courtney [name] LRT/CTRS        Courtney [name] 11/24/2018 11:53 AM 

## 2018-11-24 NOTE — Progress Notes (Signed)
[facility] MD Progress Note  11/24/2018 3:25 PM Courtney [name]  MRN:  [id number] Subjective: Follow-up for this patient with depression.  Patient seen chart reviewed.  Patient has no new complaints.  Affect is upbeat.  Thoughts are lucid.  Denies any suicidal ideation.  Denies any psychotic symptoms.  She is working on arranging for disposition by calling around to boarding houses. Principal Problem: Moderate major depression (HCC) Diagnosis: Principal Problem:   Moderate major depression (HCC) Active Problems:   Chronic post-traumatic stress disorder (PTSD)   Bipolar disorder (HCC)   Cannabis abuse  Total Time spent with patient: 20 minutes  Past Psychiatric History: Patient has a history of chronic mental health problems with multiple hospitalizations  Past Medical History:  Past Medical History:  Diagnosis Date  . ADHD    As a child  . Anxiety   . Bipolar 1 disorder (HCC)   . Depression   . Fatty liver   . OCD (obsessive compulsive disorder)   . PTSD (post-traumatic stress disorder)   . Pyloric stenosis     Past Surgical History:  Procedure Laterality Date  . APPENDECTOMY  02/17/2014  . COLONOSCOPY WITH PROPOFOL N/A 09/03/2018   Procedure: COLONOSCOPY WITH PROPOFOL;  Surgeon: Pasty Spillers, MD;  Location: [facility] ENDOSCOPY;  Service: Endoscopy;  Laterality: N/A;  . ESOPHAGOGASTRODUODENOSCOPY (EGD) WITH PROPOFOL N/A 09/03/2018   Procedure: ESOPHAGOGASTRODUODENOSCOPY (EGD) WITH PROPOFOL;  Surgeon: Pasty Spillers, MD;  Location: [facility] ENDOSCOPY;  Service: Endoscopy;  Laterality: N/A;  . EUS N/A 10/14/2018   Procedure: FULL UPPER ENDOSCOPIC ULTRASOUND (EUS) RADIAL;  Surgeon: Rayann Heman, MD;  Location: [facility] ENDOSCOPY;  Service: Endoscopy;  Laterality: N/A;  . pyloric stenosis repair     Family History:  Family History  Problem Relation Age of Onset  . COPD Mother   . Hypertension Mother   . Asthma Mother   . Arthritis Mother   . Pancreatic cancer Mother        slow  growing  . ADD / ADHD Mother   . Other Mother        Spinal Stenosis - currently in surgery today  . Bipolar disorder Father   . Arthritis Father   . Hypercholesterolemia Father   . Asthma Brother   . Hernia Brother   . ADD / ADHD Brother   . Hypertension Maternal Grandmother   . Heart disease Maternal Grandmother 39  . Rheumatic fever Maternal Grandmother   . Heart attack Maternal Grandmother        Bypass Surgery  . Pancreatic cancer Maternal Grandfather   . Liver cancer Maternal Grandfather   . Obesity Paternal Grandmother    Family Psychiatric  History: See previous Social History:  Social History   Substance and Sexual Activity  Alcohol Use Yes   Comment: occasionally drink a beer     Social History   Substance and Sexual Activity  Drug Use Not Currently  . Types: Marijuana   Comment: cocaine in the past but not currently     Social History   Socioeconomic History  . Marital status: Single    Spouse name: Not on file  . Number of children: 0  . Years of education: Not on file  . Highest education level: Not on file  Occupational History  . Occupation: disability     Comment: mental health   Social Needs  . Financial resource strain: Somewhat hard  . Food insecurity:    Worry: Never true    Inability:[facility] Never true  .  Transportation needs:    Medical: No    Non-medical: No  Tobacco Use  . Smoking status: Former Smoker    Packs/day: 0.25    Years: 10.00    Pack years: 2.50    Types: Cigarettes    Start date: 01/01/2008  . Smokeless tobacco: Never Used  Substance and Sexual Activity  . Alcohol use: Yes    Comment: occasionally drink a beer  . Drug use: Not Currently    Types: Marijuana    Comment: cocaine in the past but not currently   . Sexual activity: Yes    Partners: Male    Birth control/protection: Condom  Lifestyle  . Physical activity:    Days per week: 5 days    Minutes per session: 60 min  . Stress: Rather much  Relationships  .  Social connections:    Talks on phone: More than three times a week    Gets together: Three times a week    Attends religious service: More than 4 times per year    Active member of club or organization: Yes    Attends meetings of clubs or organizations: More than 4 times per year    Relationship status: Never married  Other Topics Concern  . Not on file  Social History Narrative   Moved to [place] from [facility] to be closer to family. Parents moved here in 2018.   On disability for bipolar disorder since young age, had to be placed in a group home and foster home because of behavior. She is now living with parents since she decided to take her medications.    Additional Social History:                         Sleep: Fair  Appetite:  Fair  Current Medications: Current Facility-Administered Medications  Medication Dose Route Frequency Provider Last Rate Last Dose  . acetaminophen (TYLENOL) tablet 650 mg  650 mg Oral Q6H PRN Thalia Party, MD      . alum & mag hydroxide-simeth (MAALOX/MYLANTA) 200-200-20 MG/5ML suspension 30 mL  30 mL Oral Q4H PRN Paliy, Alisa, MD      . hydrOXYzine (ATARAX/VISTARIL) tablet 100 mg  100 mg Oral QHS Darliss Ridgel, MD   100 mg at 11/23/18 2113  . hydrOXYzine (ATARAX/VISTARIL) tablet 50 mg  50 mg Oral Daily PRN Darliss Ridgel, MD      . lamoTRIgine (LAMICTAL) tablet 200 mg  200 mg Oral Daily Darliss Ridgel, MD   200 mg at 11/24/18 1149  . magnesium hydroxide (MILK OF MAGNESIA) suspension 30 mL  30 mL Oral Daily PRN Thalia Party, MD      . nortriptyline (PAMELOR) capsule 20 mg  20 mg Oral QHS Darliss Ridgel, MD   20 mg at 11/23/18 2112  . risperiDONE (RISPERDAL) tablet 4 mg  4 mg Oral QPM Darliss Ridgel, MD   4 mg at 11/23/18 1754    Lab Results: No results found for this or any previous visit (from the past 48 hour(s)).  Blood Alcohol level:  Lab Results  Component Value Date   ETH <10 11/20/2018   ETH <10 02/27/2018    Metabolic Disorder  Labs: Lab Results  Component Value Date   HGBA1C 5.5 11/20/2018   MPG 111 11/20/2018   MPG 111 07/20/2018   No results found for: PROLACTIN Lab Results  Component Value Date   CHOL 118 11/20/2018   TRIG 31  11/20/2018   HDL 73 11/20/2018   CHOLHDL 1.6 11/20/2018   VLDL 6 11/20/2018   LDLCALC 39 11/20/2018   LDLCALC 30 02/24/2018    Physical Findings: AIMS:  , ,  ,  ,    CIWA:    COWS:     Musculoskeletal: Strength & Muscle Tone: within normal limits Gait & Station: normal Patient leans: N/A  Psychiatric Specialty Exam: Physical Exam  Nursing note and vitals reviewed. Constitutional: She appears well-developed and well-nourished.  HENT:  Head: Normocephalic and atraumatic.  Eyes: Pupils are equal, round, and reactive to light. Conjunctivae are normal.  Neck: Normal range of motion.  Cardiovascular: Regular rhythm and normal heart sounds.  Respiratory: Effort normal.  GI: Soft.  Musculoskeletal: Normal range of motion.  Neurological: She is alert.  Skin: Skin is warm and dry.  Psychiatric: She has a normal mood and affect. Her speech is normal and behavior is normal. Judgment and thought content normal. Cognition and memory are normal.    Review of Systems  Constitutional: Negative.   HENT: Negative.   Eyes: Negative.   Respiratory: Negative.   Cardiovascular: Negative.   Gastrointestinal: Negative.   Musculoskeletal: Negative.   Skin: Negative.   Neurological: Negative.   Psychiatric/Behavioral: Negative.     Blood pressure (!) 114/50, pulse 73, temperature 97.8 F (36.6 C), temperature source Oral, resp. rate 16, height 4\' 11"  (1.499 m), weight 71 kg, last menstrual period 11/06/2018, SpO2 95 %.Body mass index is 31.61 kg/m.  General Appearance: Casual  Eye Contact:  Good  Speech:  Normal Rate  Volume:  Normal  Mood:  Euthymic  Affect:  Appropriate  Thought Process:  Coherent  Orientation:  Full (Time, Place, and Person)  Thought Content:  Logical   Suicidal Thoughts:  No  Homicidal Thoughts:  No  Memory:  Immediate;   Fair Recent;   Fair Remote;   Fair  Judgement:  Fair  Insight:  Fair  Psychomotor Activity:  Normal  Concentration:  Concentration: Fair  Recall:  [employer] of Knowledge:  Fair  Language:  Fair  Akathisia:  No  Handed:  Right  AIMS (if indicated):     Assets:  Desire for Improvement Physical Health  ADL's:  Intact  Cognition:  WNL  Sleep:  Number of Hours: 8.15     Treatment Plan Summary: Daily contact with patient to assess and evaluate symptoms and progress in treatment, Medication management and Plan No change to medication management.  Supportive therapy and psychoeducation.  Social work is working with the patient to arrange for outpatient care and arrange for her to find a living place.  Hope for discharge by tomorrow.  Mordecai Rasmussen, MD 11/24/2018, 3:25 PM

## 2018-11-24 NOTE — [facility] Suicide Risk Assessment (Signed)
[facility] Discharge Suicide Risk Assessment   Principal Problem: Moderate major depression (HCC) Discharge Diagnoses: Principal Problem:   Moderate major depression (HCC) Active Problems:   Chronic post-traumatic stress disorder (PTSD)   Bipolar disorder (HCC)   Cannabis abuse   Total Time spent with patient: 45 minutes  Musculoskeletal: Strength & Muscle Tone: within normal limits Gait & Station: normal Patient leans: N/A  Psychiatric Specialty Exam: Review of Systems  Constitutional: Negative.   HENT: Negative.   Eyes: Negative.   Respiratory: Negative.   Cardiovascular: Negative.   Gastrointestinal: Negative.   Musculoskeletal: Negative.   Skin: Negative.   Neurological: Negative.   Psychiatric/Behavioral: Negative.     Blood pressure (!) 114/50, pulse 73, temperature 97.8 F (36.6 C), temperature source Oral, resp. rate 16, height 4\' 11"  (1.499 m), weight 71 kg, last menstrual period 11/06/2018, SpO2 95 %.Body mass index is 31.61 kg/m.  General Appearance: Casual  Eye Contact::  Good  Speech:  Clear and Coherent409  Volume:  Normal  Mood:  Euthymic  Affect:  Congruent  Thought Process:  Goal Directed  Orientation:  Full (Time, Place, and Person)  Thought Content:  Logical  Suicidal Thoughts:  No  Homicidal Thoughts:  No  Memory:  Immediate;   Fair Recent;   Fair Remote;   Fair  Judgement:  Fair  Insight:  Fair  Psychomotor Activity:  Normal  Concentration:  Fair  Recall:  [employer] of Knowledge:Fair  Language: Fair  Akathisia:  No  Handed:  Right  AIMS (if indicated):     Assets:  Desire for Improvement Physical Health Social Support  Sleep:  Number of Hours: 8.15  Cognition: WNL  ADL's:  Intact   Mental Status Per Nursing Assessment::   On Admission:  Self-harm thoughts  Demographic Factors:  Living alone  Loss Factors: Financial problems/change in socioeconomic status  Historical Factors: Prior suicide attempts  Risk Reduction Factors:    Positive social support and Positive therapeutic relationship  Continued Clinical Symptoms:  Depression:   Impulsivity  Cognitive Features That Contribute To Risk:  Closed-mindedness    Suicide Risk:  Minimal: No identifiable suicidal ideation.  Patients presenting with no risk factors but with morbid ruminations; may be classified as minimal risk based on the severity of the depressive symptoms  Follow-up Information    [facility] Follow up.   Why:  Your [employer] will follow up with you.  Contact information: [address],  [place], [place] [address] [phone] Phone [phone] Fax          Plan Of Care/Follow-up recommendations:  Activity:  Activity as tolerated Diet:  Regular diet Other:  Follow-up with her community support team in [place].  Mordecai Rasmussen, MD 11/24/2018, 3:29 PM

## 2018-11-24 NOTE — Progress Notes (Signed)
D - Patient was in the day room upon arrival to the unit. Patient was pleasant during assessment and medication administration. Patient denies SI/HI/AVH, anxiety and pain. Patient endorses "some depression because of my situation." When assessed about her situation the patient stated, "I am am homeless and I don't know where I am going to be going when I do get out of here."   A - Patient was compliant with medication per MD orders and procedures on the unit. Patient given education. Patient given support and encouragement to be active in her treatment plan. Patient informed to let staff know if there are any issues or problems on the unit.   R - Patient being monitored Q 15 minutes for safety per unit protocol. Patient remains safe on the unit.

## 2018-11-25 NOTE — Progress Notes (Signed)
[facility] MD Progress Note  11/25/2018 2:54 PM Courtney [name]  MRN:  [id number] Subjective: Patient seen and chart reviewed.  Follow-up for this patient with recurrent depression.  Patient states her mood today is feeling fine.  She denies any suicidal or homicidal ideation.  Denies any hallucinations.  Affect is upbeat.  No physical complaints.  Tolerating medicine without difficulty.  Patient wants to be discharged tomorrow. Principal Problem: Moderate major depression (HCC) Diagnosis: Principal Problem:   Moderate major depression (HCC) Active Problems:   Chronic post-traumatic stress disorder (PTSD)   Bipolar disorder (HCC)   Cannabis abuse  Total Time spent with patient: 15 minutes  Past Psychiatric History: Patient has a past history of multiple hospitalizations chronic depression ADHD anxiety disorder and PTSD.  Past Medical History:  Past Medical History:  Diagnosis Date  . ADHD    As a child  . Anxiety   . Bipolar 1 disorder (HCC)   . Depression   . Fatty liver   . OCD (obsessive compulsive disorder)   . PTSD (post-traumatic stress disorder)   . Pyloric stenosis     Past Surgical History:  Procedure Laterality Date  . APPENDECTOMY  02/17/2014  . COLONOSCOPY WITH PROPOFOL N/A 09/03/2018   Procedure: COLONOSCOPY WITH PROPOFOL;  Surgeon: Pasty Spillers, MD;  Location: [facility] ENDOSCOPY;  Service: Endoscopy;  Laterality: N/A;  . ESOPHAGOGASTRODUODENOSCOPY (EGD) WITH PROPOFOL N/A 09/03/2018   Procedure: ESOPHAGOGASTRODUODENOSCOPY (EGD) WITH PROPOFOL;  Surgeon: Pasty Spillers, MD;  Location: [facility] ENDOSCOPY;  Service: Endoscopy;  Laterality: N/A;  . EUS N/A 10/14/2018   Procedure: FULL UPPER ENDOSCOPIC ULTRASOUND (EUS) RADIAL;  Surgeon: Rayann Heman, MD;  Location: [facility] ENDOSCOPY;  Service: Endoscopy;  Laterality: N/A;  . pyloric stenosis repair     Family History:  Family History  Problem Relation Age of Onset  . COPD Mother   . Hypertension Mother   . Asthma Mother    . Arthritis Mother   . Pancreatic cancer Mother        slow growing  . ADD / ADHD Mother   . Other Mother        Spinal Stenosis - currently in surgery today  . Bipolar disorder Father   . Arthritis Father   . Hypercholesterolemia Father   . Asthma Brother   . Hernia Brother   . ADD / ADHD Brother   . Hypertension Maternal Grandmother   . Heart disease Maternal Grandmother 71  . Rheumatic fever Maternal Grandmother   . Heart attack Maternal Grandmother        Bypass Surgery  . Pancreatic cancer Maternal Grandfather   . Liver cancer Maternal Grandfather   . Obesity Paternal Grandmother    Family Psychiatric  History: See previous Social History:  Social History   Substance and Sexual Activity  Alcohol Use Yes   Comment: occasionally drink a beer     Social History   Substance and Sexual Activity  Drug Use Not Currently  . Types: Marijuana   Comment: cocaine in the past but not currently     Social History   Socioeconomic History  . Marital status: Single    Spouse name: Not on file  . Number of children: 0  . Years of education: Not on file  . Highest education level: Not on file  Occupational History  . Occupation: disability     Comment: mental health   Social Needs  . Financial resource strain: Somewhat hard  . Food insecurity:    Worry: Never  true    Inability: Never true  . Transportation needs:    Medical: No    Non-medical: No  Tobacco Use  . Smoking status: Former Smoker    Packs/day: 0.25    Years: 10.00    Pack years: 2.50    Types: Cigarettes    Start date: 01/01/2008  . Smokeless tobacco: Never Used  Substance and Sexual Activity  . Alcohol use: Yes    Comment: occasionally drink a beer  . Drug use: Not Currently    Types: Marijuana    Comment: cocaine in the past but not currently   . Sexual activity: Yes    Partners: Male    Birth control/protection: Condom  Lifestyle  . Physical activity:    Days per week: 5 days    Minutes per  session: 60 min  . Stress: Rather much  Relationships  . Social connections:    Talks on phone: More than three times a week    Gets together: Three times a week    Attends religious service: More than 4 times per year    Active member of club or organization: Yes    Attends meetings of clubs or organizations: More than 4 times per year    Relationship status: Never married  Other Topics Concern  . Not on file  Social History Narrative   Moved to [place] from [facility] to be closer to family. Parents moved here in 2018.   On disability for bipolar disorder since young age, had to be placed in a group home and foster home because of behavior. She is now living with parents since she decided to take her medications.    Additional Social History:                         Sleep: Fair  Appetite:  Fair  Current Medications: Current Facility-Administered Medications  Medication Dose Route Frequency Provider Last Rate Last Dose  . acetaminophen (TYLENOL) tablet 650 mg  650 mg Oral Q6H PRN Thalia Party, MD      . alum & mag hydroxide-simeth (MAALOX/MYLANTA) 200-200-20 MG/5ML suspension 30 mL  30 mL Oral Q4H PRN Paliy, Alisa, MD      . hydrOXYzine (ATARAX/VISTARIL) tablet 100 mg  100 mg Oral QHS Darliss Ridgel, MD   100 mg at 11/24/18 2131  . hydrOXYzine (ATARAX/VISTARIL) tablet 50 mg  50 mg Oral Daily PRN Darliss Ridgel, MD      . lamoTRIgine (LAMICTAL) tablet 200 mg  200 mg Oral Daily Darliss Ridgel, MD   200 mg at 11/25/18 0825  . magnesium hydroxide (MILK OF MAGNESIA) suspension 30 mL  30 mL Oral Daily PRN Thalia Party, MD      . nortriptyline (PAMELOR) capsule 20 mg  20 mg Oral QHS Darliss Ridgel, MD   20 mg at 11/24/18 2131  . risperiDONE (RISPERDAL) tablet 4 mg  4 mg Oral QPM Darliss Ridgel, MD   4 mg at 11/24/18 1744    Lab Results: No results found for this or any previous visit (from the past 48 hour(s)).  Blood Alcohol level:  Lab Results  Component Value Date   ETH <10  11/20/2018   ETH <10 02/27/2018    Metabolic Disorder Labs: Lab Results  Component Value Date   HGBA1C 5.5 11/20/2018   MPG 111 11/20/2018   MPG 111 07/20/2018   No results found for: PROLACTIN Lab Results  Component Value Date  CHOL 118 11/20/2018   TRIG 31 11/20/2018   HDL 73 11/20/2018   CHOLHDL 1.6 11/20/2018   VLDL 6 11/20/2018   LDLCALC 39 11/20/2018   LDLCALC 30 02/24/2018    Physical Findings: AIMS:  , ,  ,  ,    CIWA:    COWS:     Musculoskeletal: Strength & Muscle Tone: within normal limits Gait & Station: normal Patient leans: N/A  Psychiatric Specialty Exam: Physical Exam  Nursing note and vitals reviewed. Constitutional: She appears well-developed and well-nourished.  HENT:  Head: Normocephalic and atraumatic.  Eyes: Pupils are equal, round, and reactive to light. Conjunctivae are normal.  Neck: Normal range of motion.  Cardiovascular: Regular rhythm and normal heart sounds.  Respiratory: Effort normal. No respiratory distress.  GI: Soft.  Musculoskeletal: Normal range of motion.  Neurological: She is alert.  Skin: Skin is warm and dry.  Psychiatric: She has a normal mood and affect. Her speech is normal and behavior is normal. Judgment and thought content normal. Cognition and memory are normal.    Review of Systems  Constitutional: Negative.   HENT: Negative.   Eyes: Negative.   Respiratory: Negative.   Cardiovascular: Negative.   Gastrointestinal: Negative.   Musculoskeletal: Negative.   Skin: Negative.   Neurological: Negative.   Psychiatric/Behavioral: Negative.     Blood pressure 114/73, pulse 70, temperature 97.8 F (36.6 C), temperature source Oral, resp. rate 16, height 4\' 11"  (1.499 m), weight 71 kg, last menstrual period 11/06/2018, SpO2 97 %.Body mass index is 31.61 kg/m.  General Appearance: Casual  Eye Contact:  Good  Speech:  Clear and Coherent  Volume:  Normal  Mood:  Euthymic  Affect:  Congruent  Thought Process:   Goal Directed  Orientation:  Full (Time, Place, and Person)  Thought Content:  Logical  Suicidal Thoughts:  No  Homicidal Thoughts:  No  Memory:  Immediate;   Fair Recent;   Fair Remote;   Fair  Judgement:  Fair  Insight:  Fair  Psychomotor Activity:  Normal  Concentration:  Concentration: Fair  Recall:  [employer]  Fund of Knowledge:  Fair  Language:  Fair  Akathisia:  No  Handed:  Right  AIMS (if indicated):     Assets:  Communication Skills Desire for Improvement Housing Physical Health Resilience  ADL's:  Intact  Cognition:  WNL  Sleep:  Number of Hours: 7.5     Treatment Plan Summary: Daily contact with patient to assess and evaluate symptoms and progress in treatment, Medication management and Plan Patient appears to be medically and psychiatrically stable.  Does not appear to be at current high risk of acting out dangerously.  No evidence of psychosis.  We discussed discharge planning with the patient and she is currently stating that she is hoping to stay with another patient here on the ward when that person is discharged.  Both the social worker and I have told the patient that we strongly discouraged this.  This is almost inevitably a bad idea and will complicate her disposition and increase risk of relapse of symptoms.  We will be giving the patient plenty of information about boarding houses and other places to stay with the strong encouragement that she do that.  Follow-up will be in the community.  Prescriptions are prepared.  Likely discharge tomorrow.  Mordecai RasmussenJohn , MD 11/25/2018, 2:54 PM

## 2018-11-25 NOTE — Plan of Care (Signed)
Patient stated to this writer that she was feeling better. Patient is hopeful to be getting out tomorrow.   Problem: Education: Goal: Emotional status will improve Outcome: Progressing Goal: Mental status will improve Outcome: Progressing

## 2018-11-25 NOTE — [facility] Counselor (Addendum)
CSW spoke with the pt about the upcoming discharge plan. Pt is scheduled to d/c on 11/26/18. Pt says she plans to live with another patient who is here at the hospital upon discharge. She says she will be staying at [address] [place] [address]. CSW informed the pt this plan is not encouraged and suggested to her pursuing a boarding house. The pt states she still has the list of boarding houses but is not interested in renting a room at this time. CSW discussed with her following up at the [facility]. The pt states she will go there if her plan changes. Physician also talked with the pt and informed her he is not in support of her living with another patient and suggested a different discharge plan. The pt declined. CSW spoke with Robin Palmer([facility] case manager [phone]) and informed her of the pt's discharge plan. She says she reached out to [employer] but has not heard back from him about the pt renting a room at his boarding house. She states the CST will follow up with the pt on 11/26/2018 upon her discharge from the hospital.

## 2018-11-25 NOTE — Progress Notes (Signed)
Recreation Therapy Notes  Date: 11/25/2018  Time: 9:30 am  Location: Craft Room  Behavioral response: Appropriate  Intervention Topic: Communication  Discussion/Intervention:  Group content today was focused on communication. The group defined communication and ways to communicate with others. Individuals stated reason why communication is important and some reasons to communicate with others. Patients expressed if they thought they were good at communicating with others and ways they could improve their communication skills. The group identified important parts of communication and some experiences they have had in the past with communication. The group participated in the intervention "What is that?", where they had a chance to test out their communication skills and identify ways to improve their communication techniques.  Clinical Observations/Feedback:  Patient came to group and defined communication as talking and exchanging words. She identified using her inside voice is a positive way she communicates. Participant stated she could use less profanity, try to understand more and be empathetic. Individual was social with peers and staff while participating in the intervention. Rozlynn Lippold LRT/CTRS           Thierno Hun 11/25/2018 11:01 AM

## 2018-11-25 NOTE — Progress Notes (Signed)
D: Patient's affect overly bright; she is pleasant and well-mannered.  She denies any SI/HI/AVH.  She is looking forward to discharge tomorrow.  She is planing to move in with another patient, which has been discouraged.  She has been referred to either a boarding house or [facility]; however, patient appears intent in her plan to live with another patient.  A: Continue to monitor medication management and MD orders.  Safety checks completed every 15 minutes per protocol.  Offer support and encouragement as needed.  R: Patient is receptive to staff; her behavior is appropriate.

## 2018-11-25 NOTE — Progress Notes (Signed)
D - Patient was in the day room upon arrival to the unit. Patient was pleasant during assessment and medication administration. Patient denies SI/HI/AVH, anxiety, pain and depression with this Clinical research associate. Patient stated, "I had a really good day. I think I have someone to go and I am hopeful they will let me leave tomorrow or Friday."    A - Patient was compliant with medication per MD orders and procedures on the unit. Patient given education. Patient given support and encouragement to be active in her treatment plan. Patient informed to let staff know if there are any issues or problems on the unit.   R - Patient being monitored Q 15 minutes for safety per unit protocol. Patient remains safe on the unit.

## 2018-11-25 NOTE — Plan of Care (Signed)
Patient presented cheerful and cooperative. Able to recognize her medications and complied with time for taking them. Interacting well with other patients in the breakroom without Problem: Education: Goal: Emotional status will improve Outcome: Progressing Goal: Mental status will improve Outcome: Progressing   Problem: Coping: Goal: Ability to verbalize frustrations and anger appropriately will improve Outcome: Progressing   Problem: Safety: Goal: Periods of time without injury will increase Outcome: Progressing   Problem: Coping: Goal: Coping ability will improve Outcome: Progressing   Problem: Health Behavior/Discharge Planning: Goal: Compliance with therapeutic regimen will improve Outcome: Progressing    difficulty. Looking forward to her discharge since she is "feeling better and happier."

## 2018-11-25 NOTE — BHH Group Notes (Signed)
LCSW Group Therapy Note  11/25/2018 1:00 PM  Type of Therapy/Topic:  Group Therapy:  Balance in Life  Participation Level:  Active  Description of Group:    This group will address the concept of balance and how it feels and looks when one is unbalanced. Patients will be encouraged to process areas in their lives that are out of balance and identify reasons for remaining unbalanced. Facilitators will guide patients in utilizing problem-solving interventions to address and correct the stressor making their life unbalanced. Understanding and applying boundaries will be explored and addressed for obtaining and maintaining a balanced life. Patients will be encouraged to explore ways to assertively make their unbalanced needs known to significant others in their lives, using other group members and facilitator for support and feedback.  Therapeutic Goals: 1. Patient will identify two or more emotions or situations they have that consume much of in their lives. 2. Patient will identify signs/triggers that life has become out of balance:  3. Patient will identify two ways to set boundaries in order to achieve balance in their lives:  4. Patient will demonstrate ability to communicate their needs through discussion and/or role plays  Summary of Patient Progress: Patient was present for group.  Patient appeared to be attentive while in group.  Patient engaged in discussion on feeling associated with feeling unbalanced and balanced in life.  Together the group compiled the following list of emotions that are associated with feeling unbalanced " jealousy, anger, hopelessness, anxiety, sadness, anger, busted and disgusted, lack of support, sick and tired, abandonment and abuse".  Patient was able to discuss how the emotions have impacted them.  Patient was supportive of other group members.  Patient's discussed problem solving and compiled the following list of emotions that are associated with feeling balanced  "loved, cared for, validated, valued, positive, happy, aware, supported, like everything is going all right, excited, control, motivated/determined, content/comfortable and responsible".  Therapeutic Modalities:   Cognitive Behavioral Therapy Solution-Focused Therapy Assertiveness Training  Penni Homans MSW, LCSW 11/25/2018 11:52 AM

## 2018-11-26 NOTE — Progress Notes (Signed)
Patient denies SI/HI, denies A/V hallucinations. Patient verbalizes understanding of discharge instructions, follow up care and prescriptions. Patient given all belongings from [facility] locker. Patient escorted out by staff, transported by family. 

## 2018-11-26 NOTE — Progress Notes (Signed)
Recreation Therapy Notes  INPATIENT RECREATION TR PLAN  Patient Details Name: Courtney [name] MRN: [id number] DOB: 1990-08-16 Today's Date: 11/26/2018  Rec Therapy Plan Is patient appropriate for Therapeutic Recreation?: Yes Treatment times per week: at least 3 Estimated Length of Stay: 5-7 days TR Treatment/Interventions: Group participation (Comment)  Discharge Criteria Pt will be discharged from therapy if:: Discharged Treatment plan/goals/alternatives discussed and agreed upon by:: Patient/family  Discharge Summary Short term goals set: Patient will engage in groups without prompting or encouragement from LRT x3 group sessions within 5 recreation therapy group sessions Short term goals met: Complete Progress toward goals comments: Groups attended Which groups?: Communication, Other (Comment)(Emotions, Necessities) Reason goals not met: N/A Therapeutic equipment acquired: N/A Reason patient discharged from therapy: Discharge from hospital Pt/family agrees with progress & goals achieved: Yes Date patient discharged from therapy: 11/26/18   Arley Garant 11/26/2018, 11:13 AM

## 2018-11-26 NOTE — Progress Notes (Signed)
D- Patient alert and oriented. Affect/mood. Denies SI, HI, AVH, and pain. Quotes "happy to be going home". Goal: want to stay healthy". A- Scheduled medications administered to patient, per MD orders. Support and encouragement provided.  Routine safety checks conducted every 15 minutes.  Patient informed to notify staff with problems or concerns. R- No adverse drug reactions noted. Patient contracts for safety at this time. Patient compliant with medications and treatment plan. Patient receptive, calm, and cooperative. Patient interacts well with others on the unit.  Patient remains safe at this time.

## 2018-11-26 NOTE — Progress Notes (Signed)
Recreation Therapy Notes   Date: 11/26/2018  Time: 9:30 am  Location: Craft Room  Behavioral response: Appropriate  Intervention Topic: Emotions   Discussion/Intervention:  Group content on today was focused on emotions. The group identified what emotions are and why it is important to have emotions. Patients expressed some positive and negative emotions. Individuals gave some past experiences on how they normally dealt with emotions in the past. The group described some positive ways to deal with emotions in the future. Patients participated in the intervention "Name the Megan [place]on" where individuals were given a chance to experience different emotions.  Clinical Observations/Feedback:  Patient came to group and defined emotions as feelings. She identified that she uses music and exercise as a way to deal with her feelings. Individual was social with peers and staff while participating in the intervention. Linley Moskal LRT/CTRS           Adelheid Hoggard 11/26/2018 11:05 AM

## 2018-11-26 NOTE — BHH Counselor (Signed)
CSW provided pt with a list of shelter resources. Pt reports she has the list of boarding houses that was given to her by CSW. Pt says she still plans to move in with another patient she met while in the hospital. CSW encouraged the pt to utilize the resources given to her to access stable housing.

## 2018-11-26 NOTE — BHH Counselor (Signed)
CSW received a phone call from the pt's CST team leader(Michelle Murdock). Ms. Courtney [name] says she is aware that the pt plans to go and live with a patient she met while in the hospital and is not in agreement with the pt's decision. CSW informed her the pt is scheduled to be discharged today. CST team is scheduled to meet with the pt today.

## 2018-11-26 NOTE — Progress Notes (Signed)
  [facility] Adult Case Management Discharge Plan :  Will you be returning to the same living situation after discharge:  No. pt will be living with another pt she met while in the hospital. CSW and physician have encouraged her to find other housing but the pt declined. At discharge, do you have transportation home?: Yes,  pt will be discharging with another pt. The other pt's mother will provide transportation Do you have the ability to pay for your medications: Yes,  insurance  Release of information consent forms completed and in the chart;  Patient's signature needed at discharge.  Patient to Follow up at: Follow-up Information    [facility]. Go on 11/26/2018.   Why:  Your [employer] will meet with you on Friday, November 26, 2018. Please contact your case manager, Vira Agar at [phone] if you have any questions.  Contact information: [address],  [place], [place] [address] [phone] Phone [phone] Fax          Next level of care provider has access to [place] and Suicide Prevention discussed: Yes,  Eusebio Friendly, mother  Have you used any form of tobacco in the last 30 days? (Cigarettes, Smokeless Tobacco, Cigars, and/or Pipes): No  Has patient been referred to the Quitline?: N/A patient is not a smoker  Patient has been referred for addiction treatment: N/A  Yvette Rack, LCSW 11/26/2018, 8:59 AM

## 2018-11-26 NOTE — Discharge Summary (Signed)
Physician Discharge Summary Note  Patient:  Courtney [name] is an 28 y.o., female MRN:  [id number] DOB:  1990/10/19 Patient phone:  [phone] (home)  Patient address:   [address] [place] [address],  Total Time spent with patient: 30 minutes  Date of Admission:  11/21/2018 Date of Discharge: November 26, 2018  Reason for Admission: Admitted because of depression with concern about possible suicidality chronic anxiety mood instability.  Principal Problem: Moderate major depression ([place]) Discharge Diagnoses: Principal Problem:   Moderate major depression ([facility]) Active Problems:   Chronic post-traumatic stress disorder (PTSD)   Bipolar disorder ([place])   Cannabis abuse   Past Psychiatric History: Patient has a history of multiple hospitalizations in the past.  Diagnoses of PTSD and bipolar disorder and depression.  History of suicidality in the past.  Past Medical History:  Past Medical History:  Diagnosis Date  . ADHD    As a child  . Anxiety   . Bipolar 1 disorder ([place])   . Depression   . Fatty liver   . OCD (obsessive compulsive disorder)   . PTSD (post-traumatic stress disorder)   . Pyloric stenosis     Past Surgical History:  Procedure Laterality Date  . APPENDECTOMY  02/17/2014  . COLONOSCOPY WITH PROPOFOL N/A 09/03/2018   Procedure: COLONOSCOPY WITH PROPOFOL;  Surgeon: Virgel Manifold, MD;  Location: [facility] ENDOSCOPY;  Service: Endoscopy;  Laterality: N/A;  . ESOPHAGOGASTRODUODENOSCOPY (EGD) WITH PROPOFOL N/A 09/03/2018   Procedure: ESOPHAGOGASTRODUODENOSCOPY (EGD) WITH PROPOFOL;  Surgeon: Virgel Manifold, MD;  Location: [facility] ENDOSCOPY;  Service: Endoscopy;  Laterality: N/A;  . EUS N/A 10/14/2018   Procedure: FULL UPPER ENDOSCOPIC ULTRASOUND (EUS) RADIAL;  Surgeon: Jola Schmidt, MD;  Location: [facility] ENDOSCOPY;  Service: Endoscopy;  Laterality: N/A;  . pyloric stenosis repair     Family History:  Family History  Problem Relation Age of Onset  . COPD  Mother   . Hypertension Mother   . Asthma Mother   . Arthritis Mother   . Pancreatic cancer Mother        slow growing  . ADD / ADHD Mother   . Other Mother        Spinal Stenosis - currently in surgery today  . Bipolar disorder Father   . Arthritis Father   . Hypercholesterolemia Father   . Asthma Brother   . Hernia Brother   . ADD / ADHD Brother   . Hypertension Maternal Grandmother   . Heart disease Maternal Grandmother 40  . Rheumatic fever Maternal Grandmother   . Heart attack Maternal Grandmother        Bypass Surgery  . Pancreatic cancer Maternal Grandfather   . Liver cancer Maternal Grandfather   . Obesity Paternal Grandmother    Family Psychiatric  History: See previous notes Social History:  Social History   Substance and Sexual Activity  Alcohol Use Yes   Comment: occasionally drink a beer     Social History   Substance and Sexual Activity  Drug Use Not Currently  . Types: Marijuana   Comment: cocaine in the past but not currently     Social History   Socioeconomic History  . Marital status: Single    Spouse name: Not on file  . Number of children: 0  . Years of education: Not on file  . Highest education level: Not on file  Occupational History  . Occupation: disability     Comment: mental health   Social Needs  . Financial  resource strain: Somewhat hard  . Food insecurity:    Worry: Never true    Inability: Never true  . Transportation needs:    Medical: No    Non-medical: No  Tobacco Use  . Smoking status: Former Smoker    Packs/day: 0.25    Years: 10.00    Pack years: 2.50    Types: Cigarettes    Start date: 01/01/2008  . Smokeless tobacco: Never Used  Substance and Sexual Activity  . Alcohol use: Yes    Comment: occasionally drink a beer  . Drug use: Not Currently    Types: Marijuana    Comment: cocaine in the past but not currently   . Sexual activity: Yes    Partners: Male    Birth control/protection: Condom  Lifestyle  .  Physical activity:    Days per week: 5 days    Minutes per session: 60 min  . Stress: Rather much  Relationships  . Social connections:    Talks on phone: More than three times a week    Gets together: Three times a week    Attends religious service: More than 4 times per year    Active member of club or organization: Yes    Attends meetings of clubs or organizations: More than 4 times per year    Relationship status: Never married  Other Topics Concern  . Not on file  Social History Narrative   Moved to [place] from [facility] to be closer to family. Parents moved here in 2018.   On disability for bipolar disorder since young age, had to be placed in a group home and foster home because of behavior. She is now living with parents since she decided to take her medications.     Hospital Course: Admitted to the psychiatric ward.  Patient showed no behavior problems on the unit.  She was calm and appropriate.  Denied suicidal ideation.  Participated in groups appropriately.  Took care of her hygiene appropriately.  She was put [name] on medication consistent with what she had taken previously although we did not provide her stimulant while she was here.  Patient engaged with treatment team appropriately and worked on appropriate discharge plans.  She is agreeable to outpatient treatment in the community.  She at this time is not voicing any suicidal ideation does not appear to be psychotic and is agreeable to appropriate outpatient treatment and no longer meets commitment criteria.  Patient has been counseled multiple times that living with someone she met here on the psychiatric ward is a bad idea and is likely to worsen her mental health problems.  She has been provided information about local boarding houses and strongly encouraged to find a way to live by herself.  Physical Findings: AIMS:  , ,  ,  ,    CIWA:    COWS:     Musculoskeletal: Strength & Muscle Tone: within normal limits Gait & Station:  normal Patient leans: N/A  Psychiatric Specialty Exam: Physical Exam  Nursing note and vitals reviewed. Constitutional: She appears well-developed and well-nourished.  HENT:  Head: Normocephalic and atraumatic.  Eyes: Pupils are equal, round, and reactive to light. Conjunctivae are normal.  Neck: Normal range of motion.  Cardiovascular: Regular rhythm and normal heart sounds.  Respiratory: Effort normal. No respiratory distress.  GI: Soft.  Musculoskeletal: Normal range of motion.  Neurological: She is alert.  Skin: Skin is warm and dry.  Psychiatric: She has a normal mood and affect. Her  speech is normal and behavior is normal. Judgment and thought content normal. Cognition and memory are normal.    Review of Systems  Constitutional: Negative.   HENT: Negative.   Eyes: Negative.   Respiratory: Negative.   Cardiovascular: Negative.   Gastrointestinal: Negative.   Musculoskeletal: Negative.   Skin: Negative.   Neurological: Negative.   Psychiatric/Behavioral: Negative.     Blood pressure (!) 107/56, pulse 64, temperature 97.8 F (36.6 C), temperature source Oral, resp. rate 16, height _0  (1.499 m), weight 71 kg, last menstrual period 11/06/2018, SpO2 99 %.Body mass index is 31.61 kg/m.  General Appearance: Casual  Eye Contact:  Good  Speech:  Clear and Coherent  Volume:  Normal  Mood:  Euthymic  Affect:  Congruent  Thought Process:  Goal Directed  Orientation:  Full (Time, Place, and Person)  Thought Content:  Logical  Suicidal Thoughts:  No  Homicidal Thoughts:  No  Memory:  Immediate;   Fair Recent;   Fair Remote;   Fair  Judgement:  Fair  Insight:  Fair  Psychomotor Activity:  Decreased  Concentration:  Concentration: Fair  Recall:  [place] of Knowledge:  Fair  Language:  Fair  Akathisia:  No  Handed:  Right  AIMS (if indicated):     Assets:  Desire for Improvement Physical Health  ADL's:  Intact  Cognition:  WNL  Sleep:  Number of Hours: 8      Have you used any form of tobacco in the last 30 days? (Cigarettes, Smokeless Tobacco, Cigars, and/or Pipes): No  Has this patient used any form of tobacco in the last 30 days? (Cigarettes, Smokeless Tobacco, Cigars, and/or Pipes) Yes, Yes, A prescription for an FDA-approved tobacco cessation medication was offered at discharge and the patient refused  Blood Alcohol level:  Lab Results  Component Value Date   [facility] <10 11/20/2018   ETH <10 32/54/9826    Metabolic Disorder Labs:  Lab Results  Component Value Date   HGBA1C 5.5 11/20/2018   MPG 111 11/20/2018   MPG 111 07/20/2018   No results found for: PROLACTIN Lab Results  Component Value Date   CHOL 118 11/20/2018   TRIG 31 11/20/2018   HDL 73 11/20/2018   CHOLHDL 1.6 11/20/2018   VLDL 6 11/20/2018   LDLCALC 39 11/20/2018   LDLCALC 30 02/24/2018    See Psychiatric Specialty Exam and Suicide Risk Assessment completed by Attending Physician prior to discharge.  Discharge destination:  Home  Is patient on multiple antipsychotic therapies at discharge:  No   Has Patient had three or more failed trials of antipsychotic monotherapy by history:  No  Recommended Plan for Multiple Antipsychotic Therapies: NA  Discharge Instructions    Diet - low sodium heart healthy   Complete by:  As directed    Increase activity slowly   Complete by:  As directed      Allergies as of 11/26/2018      Reactions   Vyvanse [lisdexamfetamine] Other (See Comments)   Bounce off of the wall      Medication List    STOP taking these medications   Abilify Maintena 400 MG Srer injection Generic drug:  ARIPiprazole ER   ARIPiprazole 20 MG tablet Commonly known as:  ABILIFY   Concerta 36 MG CR tablet Generic drug:  methylphenidate   hydrOXYzine 50 MG tablet Commonly known as:  ATARAX/VISTARIL   multivitamin tablet   omeprazole 20 MG capsule Commonly known as:  PRILOSEC  Psyllium 55.46 % Powd     TAKE these medications      Indication  lamoTRIgine 200 MG tablet Commonly known as:  LAMICTAL Take 1 tablet (200 mg total) by mouth daily. What changed:  Another medication with the same name was removed. Continue taking this medication, and follow the directions you see here.  Indication:  Depressive Phase of Manic-Depression   nortriptyline 10 MG capsule Commonly known as:  PAMELOR Take 2 capsules (20 mg total) by mouth at bedtime. What changed:  See the new instructions.  Indication:  Depression   risperidone 4 MG tablet Commonly known as:  RISPERDAL Take 1 tablet (4 mg total) by mouth every evening.  Indication:  Major Depressive Disorder      Follow-up Information    [facility]. Go on 11/26/2018.   Why:  Your [employer] will meet with you on Friday, November 26, 2018. Please contact your case manager, Vira Agar at [phone] if you have any questions.  Contact information: [address]  [place], [place] [address] [phone] Phone [phone] Fax          Follow-up recommendations:  Activity:  Activity as tolerated Diet:  Regular diet Other:  Continue medicine with antipsychotics and mood stabilizers.  Follow-up with her community support team through the office in [place].  Comments: Patient was engaged in individual and group counseling and given counseling and psychoeducation on appropriate management of her illness.  Signed: Alethia Berthold, MD 11/26/2018, 4:06 PM

## 2019-02-01 ENCOUNTER — Telehealth: Payer: Self-pay

## 2019-02-01 NOTE — Telephone Encounter (Signed)
Copied from [place] [id number]. Topic: General - Inquiry >> Jan 31, 2019  1:28 PM Mathis Bud wrote: Reason for CRM: Patient is requesting medical records be sent to [facility].  Patient is also requesting to speak with nurse to update new medical records before they are sent.   [facility]. Phone: [phone] Fax # [phone] Address: [address], [place] [address]  Patient call back # [phone]

## 2019-02-01 NOTE — Telephone Encounter (Signed)
Copied from CRM #[id number]. Topic: General - Inquiry >> Jan 31, 2019  1:28 PM [name], Courtney Lauren wrote: Reason for CRM: Patient is requesting medical records be sent to [facility].  Patient is also requesting to speak with nurse to update new medical records before they are sent.   [facility]. Phone: [phone] Fax # [phone] Address: [name], [place] [address]  Patient call back # [phone] 

## 2019-02-01 NOTE — Telephone Encounter (Signed)
Patient was informed to come in to sign a medical release for records to be sent to behavioral health.

## 2019-02-09 ENCOUNTER — Encounter: Payer: Medicaid Other | Admitting: Family Medicine

## 2019-02-17 ENCOUNTER — Ambulatory Visit: Payer: Medicaid Other | Admitting: Family Medicine

## 2019-02-17 ENCOUNTER — Encounter: Payer: Self-pay | Admitting: Family Medicine

## 2019-02-17 ENCOUNTER — Other Ambulatory Visit (HOSPITAL_COMMUNITY)
Admission: RE | Admit: 2019-02-17 | Discharge: 2019-02-17 | Disposition: A | Discharge status: Home / Self Care | Payer: Medicaid Other | Source: Ambulatory Visit | Attending: Family Medicine | Admitting: Family Medicine

## 2019-02-17 ENCOUNTER — Other Ambulatory Visit: Payer: Self-pay

## 2019-02-17 VITALS — BP 108/60 | HR 102 | Temp 97.9°F | Resp 14 | Ht 60.0 in | Wt 175.3 lb

## 2019-02-17 DIAGNOSIS — E559 Vitamin D deficiency, unspecified: Secondary | ICD-10-CM

## 2019-02-17 DIAGNOSIS — Z01419 Encounter for gynecological examination (general) (routine) without abnormal findings: Secondary | ICD-10-CM

## 2019-02-17 DIAGNOSIS — Z124 Encounter for screening for malignant neoplasm of cervix: Secondary | ICD-10-CM

## 2019-02-17 DIAGNOSIS — Z113 Encounter for screening for infections with a predominantly sexual mode of transmission: Secondary | ICD-10-CM

## 2019-02-17 DIAGNOSIS — Z0000 Encounter for general adult medical examination without abnormal findings: Secondary | ICD-10-CM | POA: Diagnosis not present

## 2019-02-17 NOTE — Progress Notes (Signed)
Name: Courtney [name]   MRN: [id number]    DOB: August 30, 1990   Date:02/17/2019       Progress Note  Subjective  Chief Complaint  Chief Complaint  Patient presents with  . Annual Exam  . Nail Problem    right foot big toe (fungus)    HPI  Patient presents for annual CPE.  Diet: Eating okay, but not making all healthy choices. Drinking plenty of water.  Exercise: Lifting weights every other day   USPSTF grade A and B recommendations    Office Visit from 02/17/2019 in [facility]  AUDIT-C Score  0    No ETOH use in over a month; has hx binge drinking in the past.   Depression: Phq 9 is  negative Depression screen [facility] 2/9 09/22/2018 08/26/2018 07/20/2018 02/24/2018 12/31/2017  Decreased Interest 0 1 2 0 1  Down, Depressed, Hopeless 0 _0 0  PHQ - 2 Score 0 _1 Altered sleeping 0 2 0 3 3  Tired, decreased energy _2 0 1  Change in appetite 0 0 0 3 2  Feeling bad or failure about yourself  1 - _3 Trouble concentrating 0 _4 Moving slowly or fidgety/restless 0 0 _5 Suicidal thoughts 0 0 0 3 2  PHQ-9 Score _6 Difficult doing work/chores Not difficult at all Not difficult at all Very difficult Extremely dIfficult Extremely dIfficult   Hypertension: BP Readings from Last 3 Encounters:  02/17/19 108/60  11/21/18 (!) 104/52  10/14/18 112/81   Obesity: Gined 19lbs in the last 3 months - counseling provided. Wt Readings from Last 3 Encounters:  02/17/19 175 lb 4.8 oz (79.5 kg)  11/20/18 156 lb 8 oz (71 kg)  10/14/18 159 lb 12.8 oz (72.5 kg)   BMI Readings from Last 3 Encounters:  02/17/19 34.24 kg/m  11/20/18 31.61 kg/m  10/14/18 32.28 kg/m    Hep C Screening: Negative in 2019 STD testing and prevention (HIV/chl/gon/syphilis): She has had 4 new partners in the last year. Needs screening today. Intimate partner violence: No concerns Sexual History/Pain during Intercourse:  No concerns Menstrual History/LMP/Abnormal  Bleeding:  Periods are regular.  Some cramping, but not severe, normal flow. Incontinence Symptoms: No concerns  Advanced Care Planning: A voluntary discussion about advance care planning including the explanation and discussion of advance directives.  Discussed health care proxy and Living will, and the patient was not able to identify a health care proxy.  Patient does not have a living will at present time. If patient does have living will, I have requested they bring this to the clinic to be scanned in to their chart.  Breast cancer: No family history, she has not concerning lumps today No results found for: HMMAMMO  BRCA gene screening: No family history.  Cervical cancer screening: We will check today as we are not able to locate her reported Pap smear form [facility].  Osteoporosis Screening: No family history; Not taking vitamin D supplement though she has history of Vitamin D deficiency. No results found for: HMDEXASCAN  Lipids:  Lab Results  Component Value Date   CHOL 118 11/20/2018   CHOL 90 02/24/2018   CHOL 100 10/16/2017   Lab Results  Component Value Date   HDL 73 11/20/2018   HDL 51 02/24/2018   HDL 57 10/16/2017   Lab Results  Component Value Date   LDLCALC 39  11/20/2018   LDLCALC 30 02/24/2018   LDLCALC 32 10/16/2017   Lab Results  Component Value Date   TRIG 31 11/20/2018   TRIG 30 02/24/2018   TRIG 54 10/16/2017   Lab Results  Component Value Date   CHOLHDL 1.6 11/20/2018   CHOLHDL 1.8 02/24/2018   No results found for: LDLDIRECT  Glucose:  Glucose, Bld  Date Value Ref Range Status  11/20/2018 94 70 - 99 mg/dL Final  07/20/2018 88 65 - 99 mg/dL Final    Comment:    .            Fasting reference interval .   02/27/2018 95 70 - 99 mg/dL Final    Comment:    Please note change in reference range.    Skin cancer: No concerning lesions Colorectal cancer: Denies family or personal history of colorectal cancer, no changes in BM's - no blood in  stool, dark and tarry stool, mucus in stool, or constipation/diarrhea. Lung cancer: Smokes marijuana, rarely smokes cigarettes. Low Dose CT Chest recommended if Age 56-80 years, 30 pack-year currently smoking OR have quit w/in 15years. Patient does not qualify.   ECG: No chest pain, shortness of breath, or palpitations.  Patient Active Problem List   Diagnosis Date Noted  . Bipolar disorder ([place]) 11/21/2018  . Cannabis abuse   . Columnar epithelial-lined lower esophagus   . 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 Abdominal pain, epigastric   . Rectal polyp   . Melena   . Severe anxiety 03/19/2018  . 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 Bipolar disorder, unspecified ([place]) 02/27/2018  . Chronic post-traumatic stress disorder (PTSD)   . Cannabis dependence ([place])   . PTSD (post-traumatic stress disorder) 12/31/2017  . Bipolar I disorder, current or most recent episode manic, with psychotic features ([place]) 12/31/2017  . Anxiety 12/31/2017  . OCD (obsessive compulsive disorder) 12/31/2017  . ADHD 12/31/2017  . Prediabetes 12/31/2017    Past Surgical History:  Procedure Laterality Date  . APPENDECTOMY  02/17/2014  . COLONOSCOPY WITH PROPOFOL N/A 09/03/2018   Procedure: COLONOSCOPY WITH PROPOFOL;  Surgeon: Virgel Manifold, MD;  Location: [facility] ENDOSCOPY;  Service: Endoscopy;  Laterality: N/A;  . ESOPHAGOGASTRODUODENOSCOPY (EGD) WITH PROPOFOL N/A 09/03/2018   Procedure: ESOPHAGOGASTRODUODENOSCOPY (EGD) WITH PROPOFOL;  Surgeon: Virgel Manifold, MD;  Location: [facility] ENDOSCOPY;  Service: Endoscopy;  Laterality: N/A;  . EUS N/A 10/14/2018   Procedure: FULL UPPER ENDOSCOPIC ULTRASOUND (EUS) RADIAL;  Surgeon: Jola Schmidt, MD;  Location: [facility] ENDOSCOPY;  Service: Endoscopy;  Laterality: N/A;  . pyloric stenosis repair      Family History  Problem Relation Age of Onset  . COPD Mother   . Hypertension Mother   . Asthma Mother   . Arthritis Mother   . Pancreatic cancer Mother         slow growing  . ADD / ADHD Mother   . Other Mother        Spinal Stenosis - currently in surgery today  . Bipolar disorder Father   . Arthritis Father   . Hypercholesterolemia Father   . Asthma Brother   . Hernia Brother   . ADD / ADHD Brother   . Hypertension Maternal Grandmother   . Heart disease Maternal Grandmother 68  . Rheumatic fever Maternal Grandmother   . Heart attack Maternal Grandmother        Bypass Surgery  . Pancreatic cancer Maternal Grandfather   . Liver cancer Maternal Grandfather   .  Obesity Paternal Grandmother     Social History   Socioeconomic History  . Marital status: Single    Spouse name: Not on file  . Number of children: 0  . Years of education: Not on file  . Highest education level: Not on file  Occupational History  . Occupation: disability     Comment: mental health   Social Needs  . Financial resource strain: Somewhat hard  . Food insecurity    Worry: Never true    Inability: Never true  . Transportation needs    Medical: No    Non-medical: No  Tobacco Use  . Smoking status: Former Smoker    Packs/day: 0.25    Years: 10.00    Pack years: 2.50    Types: Cigarettes    Start date: 01/01/2008  . Smokeless tobacco: Never Used  Substance and Sexual Activity  . Alcohol use: Yes    Comment: occasionally drink a beer  . Drug use: Yes    Types: Marijuana    Comment: cocaine in the past but not currently   . Sexual activity: Yes    Partners: Male    Birth control/protection: Condom  Lifestyle  . Physical activity    Days per week: 5 days    Minutes per session: 60 min  . Stress: Rather much  Relationships  . Social connections    Talks on phone: More than three times a week    Gets together: Three times a week    Attends religious service: More than 4 times per year    Active member of club or organization: Yes    Attends meetings of clubs or organizations: More than 4 times per year    Relationship status: Never married   . Intimate partner violence    Fear of current or ex partner: No    Emotionally abused: No    Physically abused: No    Forced sexual activity: No  Other Topics Concern  . Not on file  Social History Narrative   Moved to [place] from [facility] to be closer to family. Parents moved here in 2018.   On disability for bipolar disorder since young age, had to be placed in a group home and foster home because of behavior. She is now living with parents since she decided to take her medications.      Current Outpatient Medications:  .  Carnitine 250 MG CAPS, Take by mouth., Disp: , Rfl:  .  CONCERTA 36 MG CR tablet, Take 36 mg by mouth daily., Disp: , Rfl:  .  lamoTRIgine (LAMICTAL) 200 MG tablet, Take 1 tablet (200 mg total) by mouth daily., Disp: 30 tablet, Rfl: 0 .  Melatonin 1 MG CAPS, Take by mouth., Disp: , Rfl:  .  Multiple Vitamin (MULTI-VITAMIN) tablet, Take by mouth., Disp: , Rfl:  .  Omega-3 1000 MG CAPS, Take by mouth., Disp: , Rfl:  .  pyridOXINE (VITAMIN B-6) 100 MG tablet, TK 1 T PO ONCE D, Disp: , Rfl:  .  risperidone (RISPERDAL) 4 MG tablet, Take 1 tablet (4 mg total) by mouth every evening., Disp: 30 tablet, Rfl: 0 .  vitamin B-12 (CYANOCOBALAMIN) 500 MCG tablet, Take 500 mcg by mouth daily., Disp: , Rfl:  .  nortriptyline (PAMELOR) 10 MG capsule, Take 2 capsules (20 mg total) by mouth at bedtime. (Patient not taking: Reported on 02/17/2019), Disp: 30 capsule, Rfl: 0  Allergies  Allergen Reactions  . Vyvanse [Lisdexamfetamine] Other (See Comments)  Bounce off of the wall     ROS  Constitutional: Negative for fever or weight change.  Respiratory: Negative for cough and shortness of breath.   Cardiovascular: Negative for chest pain or palpitations.  Gastrointestinal: Negative for abdominal pain, no bowel changes.  Musculoskeletal: Negative for gait problem or joint swelling.  Skin: Negative for rash.  Neurological: Negative for dizziness or headache.  No other specific  complaints in a complete review of systems (except as listed in HPI above).  Objective  Vitals:   02/17/19 1132  BP: 108/60  Pulse: (!) 102  Resp: 14  Temp: 97.9 F (36.6 C)  SpO2: 98%  Weight: 175 lb 4.8 oz (79.5 kg)  Height: 5' (1.524 m)    Body mass index is 34.24 kg/m.  Physical Exam Constitutional: Patient appears well-developed and well-nourished. No distress.  HENT: Head: Normocephalic and atraumatic. Ears: B TMs ok, no erythema or effusion; Nose: Nose normal. Mouth/Throat: Oropharynx is clear and moist. No oropharyngeal exudate.  Eyes: Conjunctivae and EOM are normal. Pupils are equal, round, and reactive to light. No scleral icterus.  Neck: Normal range of motion. Neck supple. No JVD present. No thyromegaly present.  Cardiovascular: Normal rate, regular rhythm and normal heart sounds.  No murmur heard. No BLE edema. Pulmonary/Chest: Effort normal and breath sounds normal. No respiratory distress. Abdominal: Soft. Bowel sounds are normal, no distension. There is no tenderness. no masses Breast: no lumps or masses, no nipple discharge or rashes FEMALE GENITALIA:  External genitalia normal External urethra normal Vaginal vault normal without discharge or lesions Cervix normal without discharge or lesions Bimanual exam normal without masses RECTAL: no rectal masses or hemorrhoids Musculoskeletal: Normal range of motion, no joint effusions. No gross deformities Neurological: he is alert and oriented to person, place, and time. No cranial nerve deficit. Coordination, balance, strength, speech and gait are normal.  Skin: Skin is warm and dry. No rash noted. No erythema.  Psychiatric: Patient has a normal mood and affect. behavior is normal. Judgment and thought content normal.  Recent Results (from the past 2160 hour(s))  Comprehensive metabolic panel     Status: None   Collection Time: 11/20/18  7:39 PM  Result Value Ref Range   Sodium 141 135 - 145 mmol/L   Potassium  3.8 3.5 - 5.1 mmol/L   Chloride 99 98 - 111 mmol/L   CO2 27 22 - 32 mmol/L   Glucose, Bld 94 70 - 99 mg/dL   BUN 12 6 - 20 mg/dL   Creatinine, Ser 0.94 0.44 - 1.00 mg/dL   Calcium 9.8 8.9 - 10.3 mg/dL   Total Protein 8.1 6.5 - 8.1 g/dL   Albumin 4.6 3.5 - 5.0 g/dL   AST 22 15 - 41 U/L   ALT 20 0 - 44 U/L   Alkaline Phosphatase 55 38 - 126 U/L   Total Bilirubin 0.7 0.3 - 1.2 mg/dL   GFR calc non Af Amer >60 >60 mL/min   GFR calc Af Amer >60 >60 mL/min   Anion gap 15 5 - 15    Comment: Performed at [facility], [address] [place], [place] [address]  Ethanol     Status: None   Collection Time: 11/20/18  7:39 PM  Result Value Ref Range   Alcohol, Ethyl (B) <10 <10 mg/dL    Comment: (NOTE) Lowest detectable limit for serum alcohol is 10 mg/dL. For medical purposes only. Performed at [facility], [address] [place], [place] [address]  Salicylate level     Status: None   Collection Time: 11/20/18  7:39 PM  Result Value Ref Range   Salicylate Lvl <0.9 2.8 - 30.0 mg/dL    Comment: Performed at [facility], [address] [place], [place] [address]  Acetaminophen level     Status: Abnormal   Collection Time: 11/20/18  7:39 PM  Result Value Ref Range   Acetaminophen (Tylenol), Serum <10 (L) 10 - 30 ug/mL    Comment: (NOTE) Therapeutic concentrations vary significantly. A range of 10-30 ug/mL  may be an effective concentration for many patients. However, some  are best treated at concentrations outside of this range. Acetaminophen concentrations >150 ug/mL at 4 hours after ingestion  and >50 ug/mL at 12 hours after ingestion are often associated with  toxic reactions. Performed at [facility], [address] [place], [place] [address]   cbc     Status: None   Collection Time: 11/20/18  7:39 PM  Result Value Ref Range   WBC 9.5 4.0 - 10.5 K/uL   RBC 4.91 3.87 - 5.11 MIL/uL   Hemoglobin 13.6 12.0 - 15.0 g/dL   HCT 41.5  36.0 - 46.0 %   MCV 84.5 80.0 - 100.0 fL   MCH 27.7 26.0 - 34.0 pg   MCHC 32.8 30.0 - 36.0 g/dL   RDW 15.3 11.5 - 15.5 %   Platelets 316 150 - 400 K/uL   nRBC 0.0 0.0 - 0.2 %    Comment: Performed at [facility], [address] [place], [place] [address]  Hemoglobin A1c     Status: None   Collection Time: 11/20/18  7:39 PM  Result Value Ref Range   Hgb A1c MFr Bld 5.5 4.8 - 5.6 %    Comment: (NOTE)         Prediabetes: 5.7 - 6.4         Diabetes: >6.4         Glycemic control for adults with diabetes: <7.0    Mean Plasma Glucose 111 mg/dL    Comment: (NOTE) Performed At: [facility] [place], [place] [id number] Rush Farmer MD [id number]   Lipid panel     Status: None   Collection Time: 11/20/18  7:39 PM  Result Value Ref Range   Cholesterol 118 0 - 200 mg/dL   Triglycerides 31 <150 mg/dL   HDL 73 >40 mg/dL   Total CHOL/HDL Ratio 1.6 RATIO   VLDL 6 0 - 40 mg/dL   LDL Cholesterol 39 0 - 99 mg/dL    Comment:        Total Cholesterol/HDL:CHD Risk Coronary Heart Disease Risk Table                     Men   Women  1/2 Average Risk   3.4   3.3  Average Risk       5.0   4.4  2 X Average Risk   9.6   7.1  3 X Average Risk  23.4   11.0        Use the calculated Patient Ratio above and the CHD Risk Table to determine the patient's CHD Risk.        ATP III CLASSIFICATION (LDL):  <100     mg/dL   Optimal  100-129  mg/dL   Near or Above  Optimal  130-159  mg/dL   Borderline  160-189  mg/dL   High  >190     mg/dL   Very High Performed at [facility], [address] [place], [place] [address]   Urine Drug Screen, Qualitative     Status: Abnormal   Collection Time: 11/20/18  9:06 PM  Result Value Ref Range   Tricyclic, Ur Screen POSITIVE (A) NONE DETECTED   Amphetamines, Ur Screen NONE DETECTED NONE DETECTED   MDMA (Ecstasy)Ur Screen NONE DETECTED NONE DETECTED   Cocaine Metabolite,Ur [place] NONE DETECTED  NONE DETECTED   Opiate, Ur Screen NONE DETECTED NONE DETECTED   Phencyclidine (PCP) Ur S NONE DETECTED NONE DETECTED   Cannabinoid 50 Ng, Ur [place] POSITIVE (A) NONE DETECTED   Barbiturates, Ur Screen NONE DETECTED NONE DETECTED   Benzodiazepine, Ur Scrn NONE DETECTED NONE DETECTED   Methadone Scn, Ur NONE DETECTED NONE DETECTED    Comment: (NOTE) Tricyclics + metabolites, urine    Cutoff 1000 ng/mL Amphetamines + metabolites, urine  Cutoff 1000 ng/mL MDMA (Ecstasy), urine              Cutoff 500 ng/mL Cocaine Metabolite, urine          Cutoff 300 ng/mL Opiate + metabolites, urine        Cutoff 300 ng/mL Phencyclidine (PCP), urine         Cutoff 25 ng/mL Cannabinoid, urine                 Cutoff 50 ng/mL Barbiturates + metabolites, urine  Cutoff 200 ng/mL Benzodiazepine, urine              Cutoff 200 ng/mL Methadone, urine                   Cutoff 300 ng/mL The urine drug screen provides only a preliminary, unconfirmed analytical test result and should not be used for non-medical purposes. Clinical consideration and professional judgment should be applied to any positive drug screen result due to possible interfering substances. A more specific alternate chemical method must be used in order to obtain a confirmed analytical result. Gas chromatography / mass spectrometry (GC/MS) is the preferred confirmat ory method. Performed at [facility], [address] [place], [place] [address]   Pregnancy, urine     Status: None   Collection Time: 11/20/18  9:06 PM  Result Value Ref Range   Preg Test, Ur NEGATIVE NEGATIVE    Comment: Performed at [facility], [address] [place], [place] [address]    PHQ2/9: Depression screen [facility] 2/9 09/22/2018 08/26/2018 07/20/2018 02/24/2018 12/31/2017  Decreased Interest 0 1 2 0 1  Down, Depressed, Hopeless 0 _0 0  PHQ - 2 Score 0 _1 Altered sleeping 0 2 0 3 3  Tired, decreased energy _2 0 1  Change in appetite 0 0 0  3 2  Feeling bad or failure about yourself  1 - _3 Trouble concentrating 0 _4 Moving slowly or fidgety/restless 0 0 _5 Suicidal thoughts 0 0 0 3 2  PHQ-9 Score _6 Difficult doing work/chores Not difficult at all Not difficult at all Very difficult Extremely dIfficult Extremely dIfficult   Fall Risk: Fall Risk  02/17/2019 09/22/2018 08/26/2018 07/20/2018 12/31/2017  Falls in the past year? 0 0 0 0 No  Number falls in past yr: 0  0 0 - -  Injury with Fall? 0 0 0 - -  Follow up - - Falls evaluation completed - -   Functional Status Survey: Is the patient deaf or have difficulty hearing?: No Does the patient have difficulty seeing, even when wearing glasses/contacts?: No Does the patient have difficulty concentrating, remembering, or making decisions?: No Does the patient have difficulty walking or climbing stairs?: No Does the patient have difficulty dressing or bathing?: No Does the patient have difficulty doing errands alone such as visiting a doctor's office or shopping?: No   Assessment & Plan  1. Well woman exam with routine gynecological exam -USPSTF grade A and B recommendations reviewed with patient; age-appropriate recommendations, preventive care, screening tests, etc discussed and encouraged; healthy living encouraged; see AVS for patient education given to patient -Discussed importance of 150 minutes of physical activity weekly, eat two servings of fish weekly, eat one serving of tree nuts ( cashews, pistachios, pecans, almonds.Marland Kitchen) every other day, eat 6 servings of fruit/vegetables daily and drink plenty of water and avoid sweet beverages.    - HIV Antibody (routine testing w rflx) - RPR - Cytology - PAP  2. Vitamin D deficiency - VITAMIN D 25 Hydroxy (Vit-D Deficiency, Fractures)  3. Routine screening for STI (sexually transmitted infection) - HIV Antibody (routine testing w rflx) - RPR - Cytology - PAP  4. Cervical cancer screening - Cytology -  PAP

## 2019-02-18 LAB — VITAMIN D 25 HYDROXY (VIT D DEFICIENCY, FRACTURES): Vit D, 25-Hydroxy: 27 ng/mL — ABNORMAL LOW (ref 30–100)

## 2019-02-18 LAB — RPR: RPR Ser Ql: NONREACTIVE

## 2019-02-18 LAB — HIV ANTIBODY (ROUTINE TESTING W REFLEX): HIV 1&2 Ab, 4th Generation: NONREACTIVE

## 2019-02-21 ENCOUNTER — Encounter: Payer: Self-pay | Admitting: Family Medicine

## 2019-02-21 LAB — CYTOLOGY - PAP
Chlamydia: NEGATIVE
Diagnosis: NEGATIVE
Neisseria Gonorrhea: NEGATIVE

## 2019-03-04 ENCOUNTER — Ambulatory Visit (INDEPENDENT_AMBULATORY_CARE_PROVIDER_SITE_OTHER): Payer: Medicaid Other | Admitting: Family Medicine

## 2019-03-04 ENCOUNTER — Telehealth: Payer: Self-pay | Admitting: Family Medicine

## 2019-03-04 ENCOUNTER — Encounter: Payer: Self-pay | Admitting: Family Medicine

## 2019-03-04 ENCOUNTER — Other Ambulatory Visit: Payer: Self-pay

## 2019-03-04 DIAGNOSIS — F312 Bipolar disorder, current episode manic severe with psychotic features: Secondary | ICD-10-CM

## 2019-03-04 DIAGNOSIS — R200 Anesthesia of skin: Secondary | ICD-10-CM

## 2019-03-04 DIAGNOSIS — F321 Major depressive disorder, single episode, moderate: Secondary | ICD-10-CM | POA: Diagnosis not present

## 2019-03-04 DIAGNOSIS — F419 Anxiety disorder, unspecified: Secondary | ICD-10-CM

## 2019-03-04 DIAGNOSIS — F429 Obsessive-compulsive disorder, unspecified: Secondary | ICD-10-CM

## 2019-03-04 DIAGNOSIS — B351 Tinea unguium: Secondary | ICD-10-CM

## 2019-03-04 DIAGNOSIS — R202 Paresthesia of skin: Secondary | ICD-10-CM

## 2019-03-04 DIAGNOSIS — F4310 Post-traumatic stress disorder, unspecified: Secondary | ICD-10-CM | POA: Diagnosis not present

## 2019-03-04 DIAGNOSIS — F902 Attention-deficit hyperactivity disorder, combined type: Secondary | ICD-10-CM

## 2019-03-04 DIAGNOSIS — H538 Other visual disturbances: Secondary | ICD-10-CM

## 2019-03-04 MED ORDER — CICLOPIROX 8 % EX SOLN: Freq: Every day | CUTANEOUS | 0 refills | Status: DC

## 2019-03-04 NOTE — Telephone Encounter (Deleted)
Patient is calling because the patient has a 1:15pm. She has not heard anything. Please advise.  Thank you

## 2019-03-04 NOTE — Progress Notes (Signed)
Name: Courtney CarlsChristine [name]   MRN: [id number]    DOB: 07/17/1991   Date:03/04/2019       Progress Note  Subjective  Chief Complaint  Chief Complaint  Patient presents with  . Follow-up  . Nail Problem    fungus    I connected with  Courtney [name]  on 03/04/19 at  1:20 PM EDT by a video enabled telemedicine application and verified that I am speaking with the correct person using two identifiers.  I discussed the limitations of evaluation and management by telemedicine and the availability of in person appointments. The patient expressed understanding and agreed to proceed. Staff also discussed with the patient that there may be a patient responsible charge related to this service. Patient Location: Home Provider Location: Office Additional Individuals present: None  HPI  Tingling, blurred, vision, balance issues: Seeing Dr. Malvin JohnsPotter, has EMG and EEG scheduled for the end of August.  She had telehealth visit on 03/02/2019 - lamictal was started and she is tapering up.  She did have recent MR brain March 2020 and this was normal.  She also notes her back pain is worsening - despite having tried nortriptyline and gabapentin in the past.   Mental health problems: She is living with parents and twin brother, she is unable to work secondary to mental health problems including bipolar disorder type I, ADHD, depression with psychotic features, PTSD and anxiety. She has a team BND (Based out of [place] - she works with a Paramedictherapist, substance abuse counselor, etc). Reviewed medications today with patient. She states mood has been stable, no suicidal thoughts or ideation, normal energy level, normal appetite. She has upcoming appointment with Dr. Elna BreslowEappen who is going to start managing her medications.   Onychomycosis: She sent photo earlier this month for my evaluation.  She notes onychomycosis to the bilateral great toes - the right great toe is the worst area.  There is darkening and thickening of the  nail, no pain or purulent drainage.   Patient Active Problem List   Diagnosis Date Noted  . Bipolar disorder (HCC) 11/21/2018  . Cannabis abuse   . B12 deficiency 10/11/2018  . Vitamin D deficiency 10/11/2018  . Columnar epithelial-lined lower esophagus   . 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 Abdominal pain, epigastric   . Rectal polyp   . Melena   . Severe anxiety 03/19/2018  . Moderate major depression (HCC) 03/19/2018  . Bipolar disorder, unspecified (HCC) 02/27/2018  . Chronic post-traumatic stress disorder (PTSD)   . Cannabis dependence (HCC)   . PTSD (post-traumatic stress disorder) 12/31/2017  . Bipolar I disorder, current or most recent episode manic, with psychotic features (HCC) 12/31/2017  . Anxiety 12/31/2017  . OCD (obsessive compulsive disorder) 12/31/2017  . ADHD 12/31/2017  . Prediabetes 12/31/2017    Past Surgical History:  Procedure Laterality Date  . APPENDECTOMY  02/17/2014  . COLONOSCOPY WITH PROPOFOL N/A 09/03/2018   Procedure: COLONOSCOPY WITH PROPOFOL;  Surgeon: Pasty Spillersahiliani, Varnita B, MD;  Location: [facility] ENDOSCOPY;  Service: Endoscopy;  Laterality: N/A;  . ESOPHAGOGASTRODUODENOSCOPY (EGD) WITH PROPOFOL N/A 09/03/2018   Procedure: ESOPHAGOGASTRODUODENOSCOPY (EGD) WITH PROPOFOL;  Surgeon: Pasty Spillersahiliani, Varnita B, MD;  Location: [facility] ENDOSCOPY;  Service: Endoscopy;  Laterality: N/A;  . EUS N/A 10/14/2018   Procedure: FULL UPPER ENDOSCOPIC ULTRASOUND (EUS) RADIAL;  Surgeon: Rayann HemanJowell, Paul, MD;  Location: [facility] ENDOSCOPY;  Service: Endoscopy;  Laterality: N/A;  . pyloric stenosis repair      Family History  Problem Relation Age  of Onset  . COPD Mother   . Hypertension Mother   . Asthma Mother   . Arthritis Mother   . Pancreatic cancer Mother        slow growing  . ADD / ADHD Mother   . Other Mother        Spinal Stenosis - currently in surgery today  . Bipolar disorder Father   . Arthritis Father   . Hypercholesterolemia Father   . Asthma Brother    . Hernia Brother   . ADD / ADHD Brother   . Hypertension Maternal Grandmother   . Heart disease Maternal Grandmother 69  . Rheumatic fever Maternal Grandmother   . Heart attack Maternal Grandmother        Bypass Surgery  . Pancreatic cancer Maternal Grandfather   . Liver cancer Maternal Grandfather   . Obesity Paternal Grandmother     Social History   Socioeconomic History  . Marital status: Single    Spouse name: Not on file  . Number of children: 0  . Years of education: Not on file  . Highest education level: Not on file  Occupational History  . Occupation: disability     Comment: mental health   Social Needs  . Financial resource strain: Somewhat hard  . Food insecurity    Worry: Never true    Inability: Never true  . Transportation needs    Medical: No    Non-medical: No  Tobacco Use  . Smoking status: Former Smoker    Packs/day: 0.25    Years: 10.00    Pack years: 2.50    Types: Cigarettes    Start date: 01/01/2008  . Smokeless tobacco: Never Used  Substance and Sexual Activity  . Alcohol use: Yes    Comment: occasionally drink a beer  . Drug use: Yes    Types: Marijuana    Comment: cocaine in the past but not currently   . Sexual activity: Yes    Partners: Male    Birth control/protection: Condom  Lifestyle  . Physical activity    Days per week: 5 days    Minutes per session: 60 min  . Stress: Rather much  Relationships  . Social connections    Talks on phone: More than three times a week    Gets together: Three times a week    Attends religious service: More than 4 times per year    Active member of club or organization: Yes    Attends meetings of clubs or organizations: More than 4 times per year    Relationship status: Never married  . Intimate partner violence    Fear of current or ex partner: No    Emotionally abused: No    Physically abused: No    Forced sexual activity: No  Other Topics Concern  . Not on file  Social History Narrative    Moved to [place] from [facility] to be closer to family. Parents moved here in 2018.   On disability for bipolar disorder since young age, had to be placed in a group home and foster home because of behavior. She is now living with parents since she decided to take her medications.      Current Outpatient Medications:  .  Carnitine 250 MG CAPS, Take by mouth., Disp: , Rfl:  .  CONCERTA 36 MG CR tablet, Take 36 mg by mouth daily., Disp: , Rfl:  .  Ginkgo Biloba (GINKOBA) 40 MG TABS, Take 120 mg by mouth., Disp: ,  Rfl:  .  lamoTRIgine (LAMICTAL) 200 MG tablet, Take 1 tablet (200 mg total) by mouth daily., Disp: 30 tablet, Rfl: 0 .  [START ON 04/01/2019] lamoTRIgine (LAMICTAL) 25 MG tablet, Take 25 mg by mouth at bedtime., Disp: , Rfl:  .  Melatonin 1 MG CAPS, Take by mouth., Disp: , Rfl:  .  Multiple Vitamin (MULTI-VITAMIN) tablet, Take by mouth., Disp: , Rfl:  .  Omega-3 1000 MG CAPS, Take by mouth., Disp: , Rfl:  .  pyridOXINE (VITAMIN B-6) 100 MG tablet, TK 1 T PO ONCE D, Disp: , Rfl:  .  risperidone (RISPERDAL) 4 MG tablet, Take 1 tablet (4 mg total) by mouth every evening., Disp: 30 tablet, Rfl: 0 .  vitamin B-12 (CYANOCOBALAMIN) 500 MCG tablet, Take 500 mcg by mouth daily., Disp: , Rfl:   Allergies  Allergen Reactions  . Vyvanse [Lisdexamfetamine] Other (See Comments)    Bounce off of the wall    I personally reviewed active problem list, medication list, allergies, notes from last encounter, lab results with the patient/caregiver today.   ROS  Constitutional: Negative for fever or weight change.  Respiratory: Negative for cough and shortness of breath.   Cardiovascular: Negative for chest pain or palpitations.  Gastrointestinal: Negative for abdominal pain, no bowel changes.  Musculoskeletal: Negative for gait problem or joint swelling.  Skin: Negative for rash.  Neurological: Negative for dizziness or headache.  No other specific complaints in a complete review of systems (except as  listed in HPI above).  Objective  Virtual encounter, vitals not obtained.  There is no height or weight on file to calculate BMI.  Physical Exam  Constitutional: Patient appears well-developed and well-nourished. No distress.  HENT: Head: Normocephalic and atraumatic.  Neck: Normal range of motion. Pulmonary/Chest: Effort normal. No respiratory distress. Speaking in complete sentences Neurological: Pt is alert and oriented to person, place, and time. Coordination, speech and gait are normal.  Psychiatric: Patient has a normal mood and affect. behavior is normal. Judgment and thought content normal. Skin: There is thickening and darkening of the bilateral great toenails.  No results found for this or any previous visit (from the past 72 hour(s)).  PHQ2/9: Depression screen [facility] 03/04/2019 02/17/2019 09/22/2018 08/26/2018 07/20/2018  Decreased Interest 2 2 0 1 2  Down, Depressed, Hopeless 3 2 0 1 1  PHQ - 2 Score 5 4 0 2 3  Altered sleeping 3 3 0 2 0  Tired, decreased energy 3 2 1 1 2   Change in appetite 2 1 0 0 0  Feeling bad or failure about yourself  3 2 1  - 3  Trouble concentrating 3 0 0 2 1  Moving slowly or fidgety/restless 1 0 0 0 1  Suicidal thoughts 0 0 0 0 0  PHQ-9 Score 20 12 2 7 10   Difficult doing work/chores Extremely dIfficult Extremely dIfficult Not difficult at all Not difficult at all Very difficult   PHQ-2/9 Result is positive.    Fall Risk: Fall Risk  03/04/2019 02/17/2019 09/22/2018 08/26/2018 07/20/2018  Falls in the past year? 0 0 0 0 0  Number falls in past yr: 0 0 0 0 -  Injury with Fall? 0 0 0 0 -  Follow up - - - Falls evaluation completed -    Assessment & Plan  1. Moderate major depression (HCC) 2. Obsessive-compulsive disorder, unspecified type 3. PTSD (post-traumatic stress disorder) 4. Severe anxiety 5. Attention deficit hyperactivity disorder (ADHD), combined type 6. Bipolar I disorder,  current or most recent episode manic, with psychotic  features (HCC) - Seeing Dr. Elna BreslowEappen, titrating up on Lamictal per Dr. Malvin JohnsPotter - Continue current medications until seeing Dr. Elna BreslowEappen.  7. Onychomycosis - ciclopirox (PENLAC) 8 % solution; Apply topically at bedtime. Apply over nail and surrounding skin. Apply daily over previous coat. After seven (7) days, may remove with alcohol and continue cycle.  Dispense: 6.6 mL; Refill: 0 - Follow up in 3 months, discussed typical course of medication usage/slow to improve, need for good adherence.  8. Numbness and tingling 9. Blurred vision - Seeing Dr. Malvin JohnsPotter for follow up - ruling out MS and seizure activity - has EMG and EEG scheduled for August.  I discussed the assessment and treatment plan with the patient. The patient was provided an opportunity to ask questions and all were answered. The patient agreed with the plan and demonstrated an understanding of the instructions.  The patient was advised to call back or seek an in-person evaluation if the symptoms worsen or if the condition fails to improve as anticipated.  I provided 20 minutes of non-face-to-face time during this encounter.

## 2019-03-11 DIAGNOSIS — R410 Disorientation, unspecified: Secondary | ICD-10-CM | POA: Insufficient documentation

## 2019-03-21 ENCOUNTER — Other Ambulatory Visit: Payer: Self-pay

## 2019-03-21 ENCOUNTER — Encounter: Payer: Self-pay | Admitting: Psychiatry

## 2019-03-21 ENCOUNTER — Ambulatory Visit (INDEPENDENT_AMBULATORY_CARE_PROVIDER_SITE_OTHER): Payer: Medicaid Other | Admitting: Psychiatry

## 2019-03-21 ENCOUNTER — Encounter

## 2019-03-21 DIAGNOSIS — F3162 Bipolar disorder, current episode mixed, moderate: Secondary | ICD-10-CM | POA: Diagnosis not present

## 2019-03-21 DIAGNOSIS — F17200 Nicotine dependence, unspecified, uncomplicated: Secondary | ICD-10-CM

## 2019-03-21 DIAGNOSIS — F3132 Bipolar disorder, current episode depressed, moderate: Secondary | ICD-10-CM | POA: Insufficient documentation

## 2019-03-21 DIAGNOSIS — Z8659 Personal history of other mental and behavioral disorders: Secondary | ICD-10-CM | POA: Insufficient documentation

## 2019-03-21 DIAGNOSIS — F1210 Cannabis abuse, uncomplicated: Secondary | ICD-10-CM

## 2019-03-21 DIAGNOSIS — F4310 Post-traumatic stress disorder, unspecified: Secondary | ICD-10-CM

## 2019-03-21 DIAGNOSIS — F3161 Bipolar disorder, current episode mixed, mild: Secondary | ICD-10-CM | POA: Insufficient documentation

## 2019-03-21 MED ORDER — PROPRANOLOL HCL 10 MG PO TABS: 10.0000 mg | ORAL_TABLET | Freq: Three times a day (TID) | ORAL | 1 refills | Status: DC | PRN

## 2019-03-21 MED ORDER — RISPERIDONE 4 MG PO TABS: 2.0000 mg | ORAL_TABLET | Freq: Every evening | ORAL | 0 refills | Status: DC

## 2019-03-21 MED ORDER — OLANZAPINE 5 MG PO TABS: 5.0000 mg | ORAL_TABLET | Freq: Every day | ORAL | 1 refills | Status: DC
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
## 2019-03-21 NOTE — Patient Instructions (Signed)
Olanzapine tablets What is this medicine? OLANZAPINE (oh LAN za peen) is used to treat schizophrenia, psychotic disorders, and bipolar disorder. Bipolar disorder is also known as manic-depression. This medicine may be used for other purposes; ask your health care provider or pharmacist if you have questions. COMMON BRAND NAME(S): Zyprexa What should I tell my health care provider before I take this medicine? They need to know if you have any of these conditions:  breast cancer or history of breast cancer  cigarette smoker  dementia  diabetes mellitus, high blood sugar or a family history of diabetes  difficulty swallowing  glaucoma  heart disease, irregular heartbeat, or previous heart attack  history of brain tumor or head injury  kidney or liver disease  low blood pressure or dizziness when standing up  Parkinson's disease  prostate trouble  seizures (convulsions)  suicidal thoughts, plans, or attempt by you or a family member  an unusual or allergic reaction to olanzapine, other medicines, foods, dyes, or preservatives  pregnant or trying to get pregnant  breast-feeding How should I use this medicine? Take this medicine by mouth. Swallow it with a drink of water. Follow the directions on the prescription label. Take your medicine at regular intervals. Do not take it more often than directed. Do not stop taking except on the advice of your doctor or health care professional. A special MedGuide will be given to you by the pharmacist with each new prescription and refill. Be sure to read this information carefully each time. Talk to your pediatrician regarding the use of this medicine in children. While this drug may be prescribed for children as young as 13 years for selected conditions, precautions do apply. Overdosage: If you think you have taken too much of this medicine contact a poison control center or emergency room at once. NOTE: This medicine is only for you.  Do not share this medicine with others. What if I miss a dose? If you miss a dose, take it as soon as you can. If it is almost time for your next dose, take only that dose. Do not take double or extra doses. What may interact with this medicine? Do not take this medicine with any of the following medications:  certain antibiotics like grepafloxacin and sparfloxacin  certain phenothiazines like chlorpromazine, mesoridazine, and thioridazine  cisapride  clozapine  droperidol  halofantrine  levomethadyl  pimozide This medicine may also interact with the following medications:  carbamazepine  charcoal  fluvoxamine  levodopa and other medicines for Parkinson's disease  medicines for diabetes  medicines for high blood pressure  medicines for mental depression, anxiety, other mood disorders, or sleeping problems  omeprazole  rifampin  ritonavir  tobacco from cigarettes This list may not describe all possible interactions. Give your health care provider a list of all the medicines, herbs, non-prescription drugs, or dietary supplements you use. Also tell them if you smoke, drink alcohol, or use illegal drugs. Some items may interact with your medicine. What should I watch for while using this medicine? Visit your doctor or health care provider for regular checks on your progress. It may be several weeks before you see the full effects of this medicine. Notify your doctor or health care provider if your symptoms get worse, if you have new symptoms, if you are having an unusual effect from this medicine, or if you feel out of control, very discouraged or think you might harm yourself or others. This medicine may cause serious skin reactions. They can  happen weeks to months after starting the medicine. Contact your health care provider right away if you notice fevers or flu-like symptoms with a rash. The rash may be red or purple and then turn into blisters or peeling of the skin.  Or, you might notice a red rash with swelling of the face, lips or lymph nodes in your neck or under your arms. Do not suddenly stop taking this medicine. You may need to gradually reduce the dose. Ask your doctor or health care provider for advice. You may get dizzy or drowsy. Do not drive, use machinery, or do anything that needs mental alertness until you know how this medicine affects you. Do not stand or sit up quickly, especially if you are an older patient. This reduces the risk of dizzy or fainting spells. Avoid alcoholic drinks. Alcohol can increase dizziness and drowsiness with olanzapine. Do not treat yourself for colds, diarrhea or allergies without asking your doctor or health care provider for advice. Some ingredients can increase possible side effects. Your mouth may get dry. Chewing sugarless gum or sucking hard candy, and drinking plenty of water will help. This medicine can reduce the response of your body to heat or cold. Dress warm in cold weather and stay hydrated in hot weather. If possible, avoid extreme temperatures like saunas, hot tubs, very hot or cold showers, or activities that can cause dehydration such as vigorous exercise. This medicine may increase blood sugar. Ask your health care provider if changes in diet or medicines are needed if you have diabetes. If you smoke, tell your doctor if you notice this medicine is not working well for you. Talk to your doctor if you are a smoker or if you decide to stop smoking. What side effects may I notice from receiving this medicine? Side effects that you should report to your doctor or health care professional as soon as possible:  allergic reactions like skin rash, itching or hives, swelling of the face, lips, or tongue  breathing problems  difficulty in speaking or swallowing  fast heartbeat (palpitations)  fever or chills, sore throat  inability to control muscle movements in the face, hands, arms, or legs  painful  or prolonged erections  rash, fever, and swollen lymph nodes  redness, blistering, peeling or loosening of the skin, including inside the mouth  restlessness or need to keep moving  seizures (convulsions)  signs and symptoms of high blood sugar such as being more thirsty or hungry or having to urinate more than normal. You may also feel very tired or have blurry vision.  stiffness, spasms  tremors or trembling Side effects that usually do not require medical attention (report to your doctor or health care professional if they continue or are bothersome):  changes in sexual desire  constipation  drowsiness  lowered blood pressure This list may not describe all possible side effects. Call your doctor for medical advice about side effects. You may report side effects to FDA at 1-800-FDA-1088. Where should I keep my medicine? Keep out of the reach of children. Store at controlled room temperature between 15 and 30 degrees C (59 and 86 degrees F). Protect from light and moisture. Throw away any unused medicine after the expiration date. NOTE: This sheet is a summary. It may not cover all possible information. If you have questions about this medicine, talk to your doctor, pharmacist, or health care provider.  2020 Elsevier/Gold Standard (2018-11-03 10:39:02)

## 2019-03-22 ENCOUNTER — Telehealth: Payer: Self-pay

## 2019-03-22 NOTE — Telephone Encounter (Signed)
pt states that she is having concerns about side affects that she alreadying having symptoms and she is concern that it will make her symptoms worse.

## 2019-03-22 NOTE — Telephone Encounter (Signed)
Attempted to call patient - left message ?

## 2019-04-08 ENCOUNTER — Ambulatory Visit: Payer: Medicaid Other | Admitting: Psychiatry

## 2019-04-08 ENCOUNTER — Other Ambulatory Visit: Payer: Self-pay

## 2019-04-08 ENCOUNTER — Ambulatory Visit (INDEPENDENT_AMBULATORY_CARE_PROVIDER_SITE_OTHER): Payer: Medicaid Other | Admitting: Psychiatry

## 2019-04-08 DIAGNOSIS — F1210 Cannabis abuse, uncomplicated: Secondary | ICD-10-CM | POA: Diagnosis not present

## 2019-04-08 DIAGNOSIS — F3131 Bipolar disorder, current episode depressed, mild: Secondary | ICD-10-CM

## 2019-04-08 DIAGNOSIS — F902 Attention-deficit hyperactivity disorder, combined type: Secondary | ICD-10-CM

## 2019-04-08 DIAGNOSIS — F4312 Post-traumatic stress disorder, chronic: Secondary | ICD-10-CM | POA: Diagnosis not present

## 2019-04-08 DIAGNOSIS — F603 Borderline personality disorder: Secondary | ICD-10-CM

## 2019-04-08 MED ORDER — LURASIDONE HCL 20 MG PO TABS: 20.0000 mg | ORAL_TABLET | Freq: Every day | ORAL | 0 refills | Status: DC

## 2019-04-08 MED ORDER — LAMOTRIGINE 100 MG PO TABS: 100.0000 mg | ORAL_TABLET | Freq: Every day | ORAL | 0 refills | Status: DC

## 2019-04-08 MED ORDER — METHYLPHENIDATE HCL ER (OSM) 54 MG PO TBCR: 54.0000 mg | EXTENDED_RELEASE_TABLET | Freq: Every day | ORAL | 0 refills | Status: DC
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
## 2019-04-28 DIAGNOSIS — R202 Paresthesia of skin: Secondary | ICD-10-CM | POA: Insufficient documentation

## 2019-04-28 DIAGNOSIS — R200 Anesthesia of skin: Secondary | ICD-10-CM | POA: Insufficient documentation

## 2019-04-29 ENCOUNTER — Ambulatory Visit (INDEPENDENT_AMBULATORY_CARE_PROVIDER_SITE_OTHER): Payer: Medicaid Other | Admitting: Psychiatry

## 2019-04-29 ENCOUNTER — Other Ambulatory Visit: Payer: Self-pay

## 2019-04-29 DIAGNOSIS — F603 Borderline personality disorder: Secondary | ICD-10-CM

## 2019-04-29 DIAGNOSIS — F3162 Bipolar disorder, current episode mixed, moderate: Secondary | ICD-10-CM | POA: Diagnosis not present

## 2019-04-29 DIAGNOSIS — F4310 Post-traumatic stress disorder, unspecified: Secondary | ICD-10-CM

## 2019-04-29 MED ORDER — VITAMIN B-6 100 MG PO TABS: ORAL_TABLET | ORAL | 2 refills | Status: DC

## 2019-04-29 MED ORDER — LURASIDONE HCL 20 MG PO TABS: 40.0000 mg | ORAL_TABLET | Freq: Every day | ORAL | 2 refills | Status: DC

## 2019-04-29 MED ORDER — PRAZOSIN HCL 1 MG PO CAPS: 1.0000 mg | ORAL_CAPSULE | Freq: Two times a day (BID) | ORAL | 1 refills | Status: DC
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
## 2019-05-02 ENCOUNTER — Other Ambulatory Visit (HOSPITAL_COMMUNITY): Payer: Self-pay

## 2019-05-02 MED ORDER — LAMOTRIGINE 100 MG PO TABS: 100.0000 mg | ORAL_TABLET | Freq: Every day | ORAL | 0 refills | Status: DC

## 2019-05-02 MED ORDER — LAMOTRIGINE 200 MG PO TABS: 200.0000 mg | ORAL_TABLET | Freq: Every day | ORAL | 0 refills | Status: DC

## 2019-05-04 ENCOUNTER — Ambulatory Visit (INDEPENDENT_AMBULATORY_CARE_PROVIDER_SITE_OTHER): Payer: Medicaid Other | Admitting: Psychiatry

## 2019-05-04 ENCOUNTER — Other Ambulatory Visit: Payer: Self-pay

## 2019-05-04 DIAGNOSIS — F4310 Post-traumatic stress disorder, unspecified: Secondary | ICD-10-CM | POA: Diagnosis not present

## 2019-05-04 DIAGNOSIS — F902 Attention-deficit hyperactivity disorder, combined type: Secondary | ICD-10-CM

## 2019-05-04 DIAGNOSIS — F3162 Bipolar disorder, current episode mixed, moderate: Secondary | ICD-10-CM

## 2019-05-04 DIAGNOSIS — R413 Other amnesia: Secondary | ICD-10-CM | POA: Insufficient documentation

## 2019-05-04 MED ORDER — CARIPRAZINE HCL 1.5 MG PO CAPS: ORAL_CAPSULE | ORAL | 0 refills | Status: DC
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
## 2019-05-11 IMAGING — CR DG CHEST 2V
1 series · 2 of 2 positions shown · non-contrast
Comparison: None.

CLINICAL DATA: Shortness of breath with cough and congestion

EXAM:
CHEST - 2 VIEW

[Series 1: dg chest 2 view · 0.14mm/px · 2 of 2 slices shown]
[im 1/2]
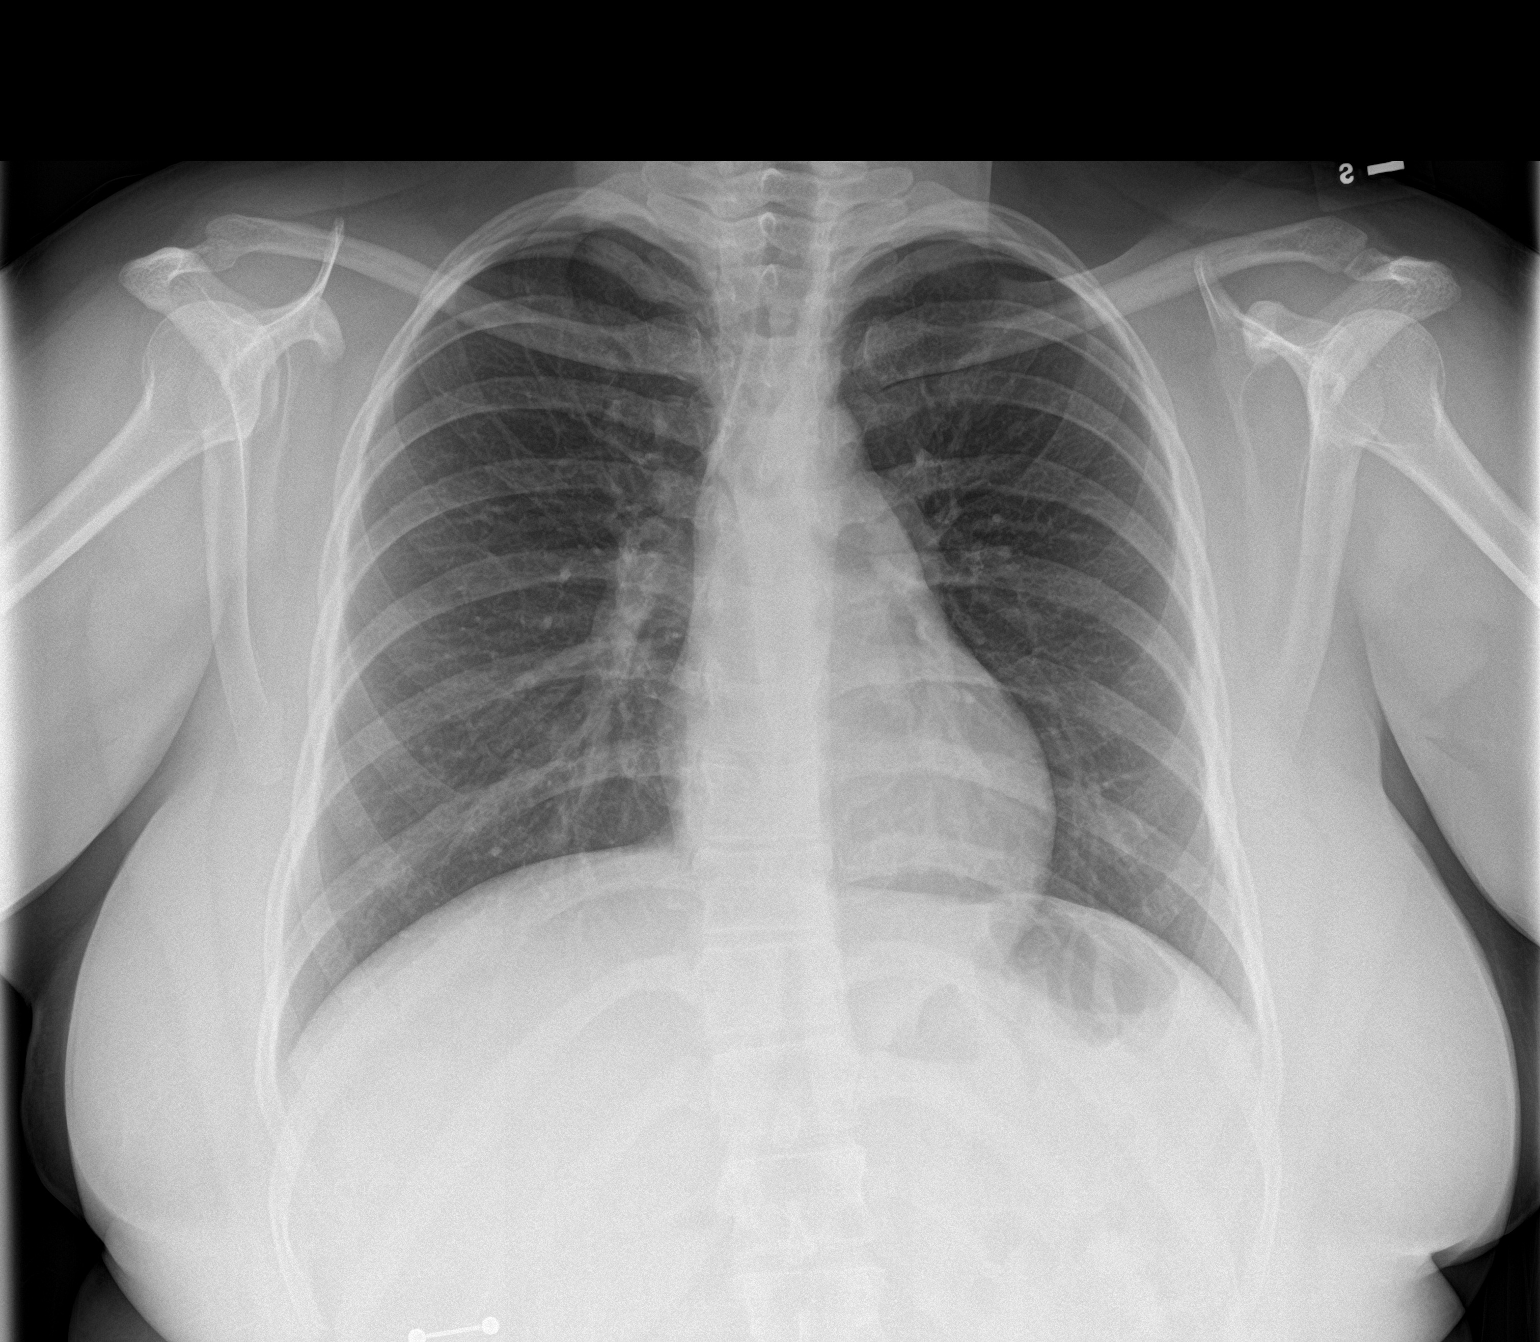
[im 2/2]
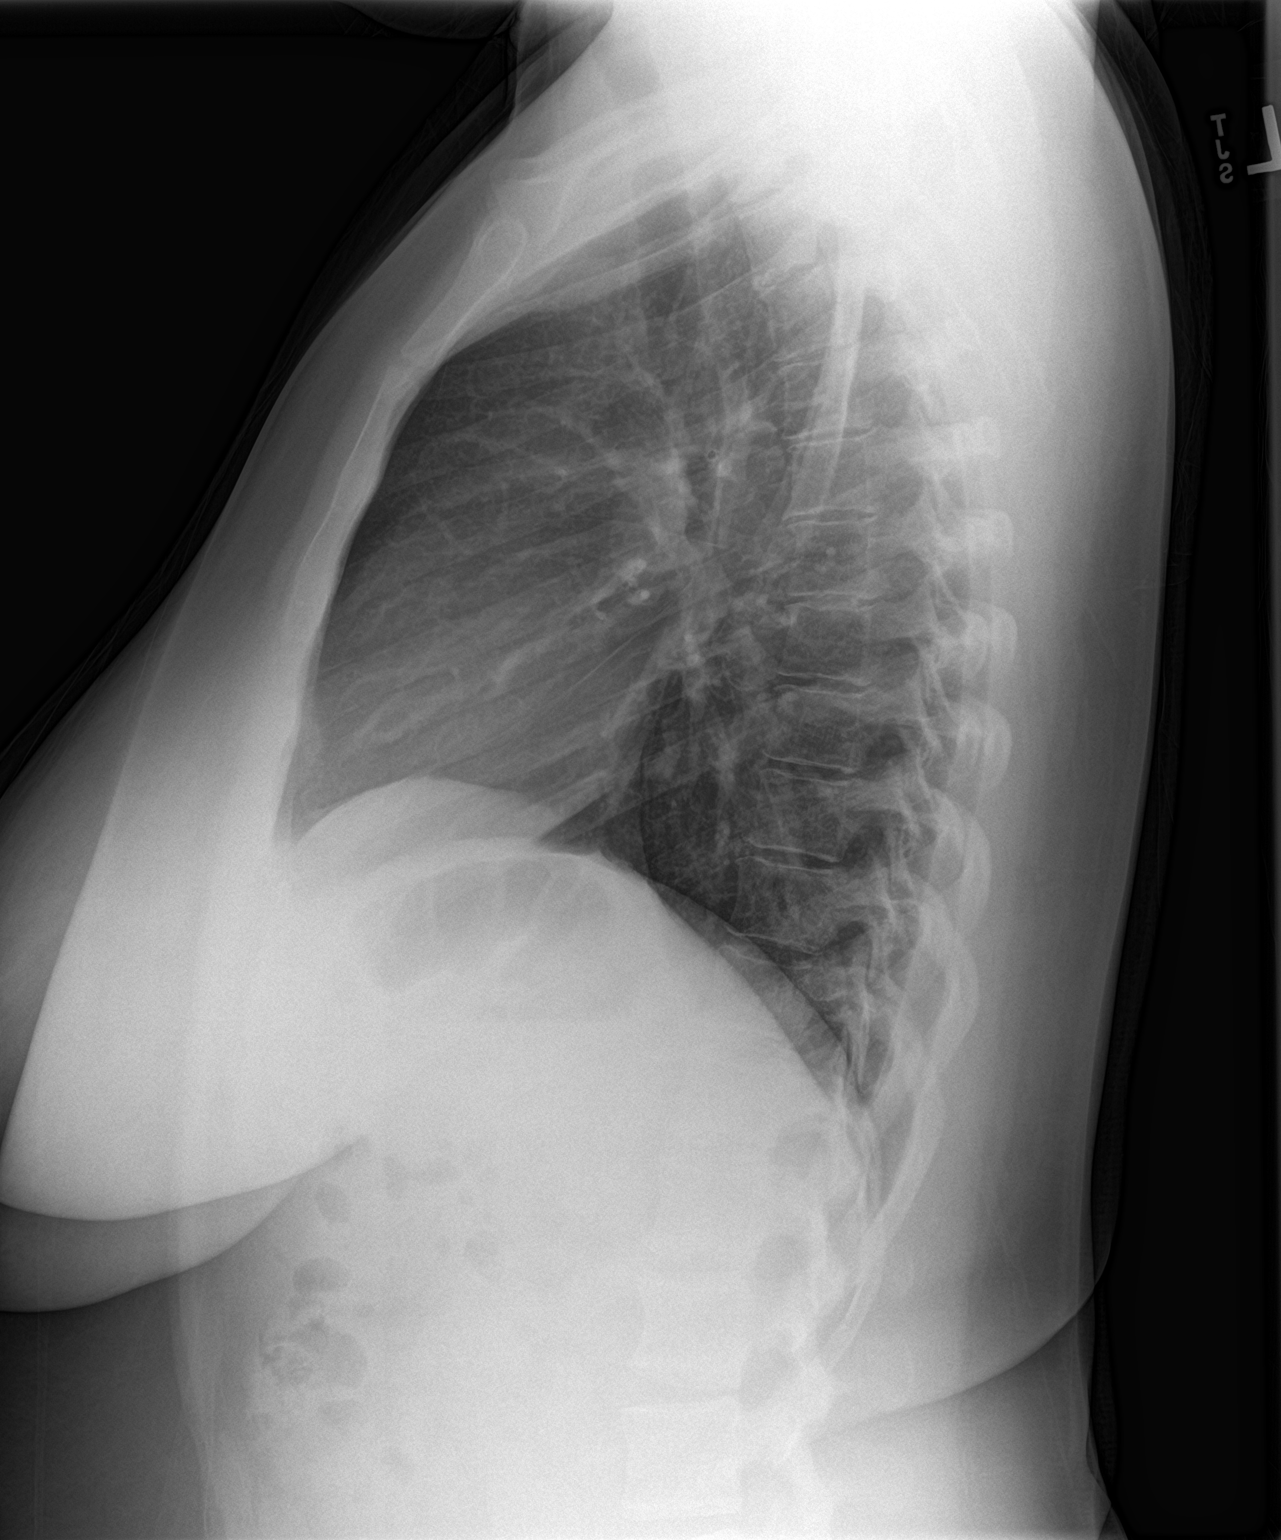

[2 of 2 positions shown; findings below may reference images not displayed]

FINDINGS: Lungs are clear. Heart size and pulmonary vascularity are normal. No
adenopathy. There is mild upper lumbar levoscoliosis.
IMPRESSION: No edema or consolidation.

## 2019-05-31 ENCOUNTER — Other Ambulatory Visit (HOSPITAL_COMMUNITY): Payer: Self-pay

## 2019-05-31 ENCOUNTER — Ambulatory Visit: Payer: Self-pay | Admitting: Gastroenterology

## 2019-05-31 ENCOUNTER — Other Ambulatory Visit: Payer: Self-pay

## 2019-05-31 MED ORDER — CARIPRAZINE HCL 3 MG PO CAPS: 3.0000 mg | ORAL_CAPSULE | Freq: Every day | ORAL | 0 refills | Status: DC

## 2019-05-31 MED ORDER — CLONAZEPAM 0.5 MG PO TABS: 0.2500 mg | ORAL_TABLET | Freq: Two times a day (BID) | ORAL | 0 refills | Status: DC

## 2019-06-01 ENCOUNTER — Ambulatory Visit: Payer: Medicaid Other | Admitting: Gastroenterology

## 2019-06-01 ENCOUNTER — Encounter: Payer: Self-pay | Admitting: Gastroenterology

## 2019-06-01 ENCOUNTER — Other Ambulatory Visit: Payer: Self-pay

## 2019-06-01 ENCOUNTER — Encounter

## 2019-06-01 VITALS — BP 128/85 | HR 97 | Temp 98.9°F | Wt 172.1 lb

## 2019-06-01 DIAGNOSIS — K5900 Constipation, unspecified: Secondary | ICD-10-CM

## 2019-06-01 DIAGNOSIS — K219 Gastro-esophageal reflux disease without esophagitis: Secondary | ICD-10-CM

## 2019-06-01 MED ORDER — POLYETHYLENE GLYCOL 3350 17 GM/SCOOP PO POWD: 1.0000 | Freq: Every day | ORAL | 0 refills | Status: DC

## 2019-06-01 MED ORDER — OMEPRAZOLE 20 MG PO CPDR: 20.0000 mg | DELAYED_RELEASE_CAPSULE | Freq: Every day | ORAL | 1 refills | Status: DC

## 2019-06-01 MED ORDER — POLYETHYLENE GLYCOL 3350 17 GM/SCOOP PO POWD: ORAL | 0 refills | Status: DC

## 2019-06-01 NOTE — Progress Notes (Signed)
Vonda Antigua, MD [address]  [place]  [place], [place] [address]  Main: [phone]  Fax: [phone]   Primary Care Physician: Steele Sizer, MD   Chief Complaint  Patient presents with  . Abdominal Pain    Patient has been having lower abdominal pain throbbing and sharp pain. Patient states she has noticed mucus in stool    HPI: Courtney [name] is a 28 y.o. female history of bipolar disorder, here for follow-up, reporting constipation.  Reports having multiple days without a bowel movement, but states "I held my stool and I should not have done that".  She took a fiber supplement yesterday which did not work and then took a suppository at bedtime which led to large bowel movement.  Has not had a bowel movement today.  No blood in stool.  Reports bilateral lower quadrant abdominal cramping pain before a bowel movement which improves after bowel movement.  No weight loss.  No diarrhea.  Also reports chest pressure associated with upper abdominal pain intermittently as well.  Was prescribed omeprazole in the past but has not been taking it.  Has history of H. pylori previously treated with triple therapy, but EGD in January 2020 not showing any further H. Pylori.  Liver ultrasound in May 2019 showed fatty liver  Colonoscopy January 2020 Impression:           - One 3 mm polyp in the rectum, removed with a cold                        biopsy forceps. Resected and retrieved.                       - The examination was otherwise normal.                       - The rectum, sigmoid colon, descending colon,                        transverse colon, ascending colon and cecum are normal.                       - The distal rectum and anal verge are normal on                        retroflexion view. Pathology showed mucosal prolapse and repeat was recommended when She is 28 years of age  EGD Jan 2020 Impression:           - Salmon-colored mucosa suspicious for  short-segment                        Barrett's esophagus. Biopsied.                       - A single papule (nodule) found in the stomach.                       - Normal stomach. Biopsied.                       - Normal duodenal bulb, second portion of the duodenum                        and examined  duodenum.                       - Biopsies were obtained in the gastric body, at the                        incisura and in the gastric antrum.  GE junction biopsies showed reflux esophagitis  She also underwent EUS in March 2020 Dr. 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 cariprazine (VRAYLAR) capsule Take 1 capsule (3 mg total) by mouth daily. 30 capsule 0  . Cholecalciferol (VITAMIN D) 50 MCG (2000 UT) tablet Take by mouth.    . ciclopirox (PENLAC) 8 % solution Apply topically at bedtime. Apply over nail and surrounding skin. Apply daily over previous coat. After seven (7) days, may remove with alcohol and continue cycle. 6.6 mL 0  . clonazePAM (KLONOPIN) 0.5 MG tablet Take 0.5 tablets (0.25 mg total) by mouth 2 (two) times daily. 30 tablet 0  . lamoTRIgine (LAMICTAL) 100 MG tablet Take 1 tablet (100 mg total) by mouth at bedtime. 30 tablet 0  . lamoTRIgine (LAMICTAL) 200 MG tablet Take 1 tablet (200 mg total) by mouth daily. 30 tablet 0  . Melatonin 1 MG CAPS Take by mouth.    . Multiple Vitamin (MULTI-VITAMIN) tablet Take by mouth.    . Omega-3 1000 MG CAPS Take by mouth.    . pyridOXINE (VITAMIN B-6) 100 MG tablet TK 1 T PO ONCE D 30 tablet 2  . omeprazole (PRILOSEC) 20 MG capsule Take 1 capsule (20 mg total) by mouth daily. 90 capsule 1  . polyethylene glycol powder (GLYCOLAX/MIRALAX) 17 GM/SCOOP powder Take 255 g by mouth daily. 22950 g 0   No current facility-administered medications for this visit.     Allergies as of 06/01/2019 - Review Complete 06/01/2019  Allergen  Reaction Noted  . Vyvanse [lisdexamfetamine] Other (See Comments) 07/10/2017    ROS:  General: Negative for anorexia, weight loss, fever, chills, fatigue, weakness. ENT: Negative for hoarseness, difficulty swallowing , nasal congestion. CV: Negative for chest pain, angina, palpitations, dyspnea on exertion, peripheral edema.  Respiratory: Negative for dyspnea at rest, dyspnea on exertion, cough, sputum, wheezing.  GI: See history of present illness. GU:  Negative for dysuria, hematuria, urinary incontinence, urinary frequency, nocturnal urination.  Endo: Negative for unusual weight change.    Physical Examination:   BP 128/85 (BP Location: Left Arm, Patient Position: Sitting, Cuff Size: Normal)   Pulse 97   Temp 98.9 F (37.2 C) (Oral)   Wt 172 lb 2 oz (78.1 kg)   BMI 33.62 kg/m   General: Well-nourished, well-developed in no acute distress.  Eyes: No icterus. Conjunctivae pink. Mouth: Oropharyngeal mucosa moist and pink , no lesions erythema or exudate. Neck: Supple, Trachea midline Abdomen: Bowel sounds are normal, nontender, nondistended, no hepatosplenomegaly or masses, no abdominal bruits or hernia , no rebound or guarding.   Extremities: No lower extremity edema. No clubbing or deformities. Neuro: Alert and oriented x 3.  Grossly intact. Skin: Warm and dry, no jaundice.   Psych: Alert and cooperative, normal mood and affect.   Labs: CMP     Component Value Date/Time   NA 141 11/20/2018 1939   K 3.8 11/20/2018 1939   CL 99 11/20/2018 1939   CO2 27 11/20/2018 1939   GLUCOSE 94 11/20/2018 1939  BUN 12 11/20/2018 1939   CREATININE 0.94 11/20/2018 1939   CREATININE 0.77 07/20/2018 1433   CALCIUM 9.8 11/20/2018 1939   PROT 8.1 11/20/2018 1939   ALBUMIN 4.6 11/20/2018 1939   AST 22 11/20/2018 1939   ALT 20 11/20/2018 1939   ALKPHOS 55 11/20/2018 1939   BILITOT 0.7 11/20/2018 1939   GFRNONAA >60 11/20/2018 1939   GFRNONAA 107 07/20/2018 1433   GFRAA >60  11/20/2018 1939   GFRAA 124 07/20/2018 1433   Lab Results  Component Value Date   WBC 9.5 11/20/2018   HGB 13.6 11/20/2018   HCT 41.5 11/20/2018   MCV 84.5 11/20/2018   PLT 316 11/20/2018    Imaging Studies: No results found.  Assessment and Plan:   Courtney [name] is a 28 y.o. y/o female constipation and abdominal pain  Cause of her lower abdominal pain especially constipation High-fiber diet MiraLAX daily with goal of 1-2 soft bowel movements daily.  If not at goal, patient instructed to increase dose to twice daily.  If loose stools with the medication, patient asked to decrease the medication to every other day, or half dose daily.  Patient verbalized understanding  Upper abdominal pain with chest pressure is likely due to GERD given that she has not been taking omeprazole Patient educated extensively on acid reflux lifestyle modification, including buying a bed wedge, not eating 3 hrs before bedtime, diet modifications, and handout given for the same.  Will start low-dose omeprazole if symptoms do not improve in the next 2 to 4 weeks she was asked to contact her primary care provider to see if she needs cardiac work-up.  However pain is atypical and not typical of cardiac etiology  Over 30 mins spent with patient and over 50% spent in counseling the patient and discussing her symptoms   Dr [name]

## 2019-06-01 NOTE — Patient Instructions (Signed)
Omeprazole 20mg  to take once a day. This medication was sent to pharmacy.  Please take Miralax daily. If loose stools happen please switch to every other day.

## 2019-06-01 NOTE — Addendum Note (Signed)
Addended by: Ulyess Blossom L on: 06/01/2019 12:50 PM   Modules accepted: Orders

## 2019-06-06 ENCOUNTER — Encounter: Payer: Self-pay | Admitting: Family Medicine

## 2019-06-06 ENCOUNTER — Other Ambulatory Visit: Payer: Self-pay

## 2019-06-06 ENCOUNTER — Ambulatory Visit (INDEPENDENT_AMBULATORY_CARE_PROVIDER_SITE_OTHER): Payer: Medicaid Other | Admitting: Family Medicine

## 2019-06-06 DIAGNOSIS — B349 Viral infection, unspecified: Secondary | ICD-10-CM

## 2019-06-06 DIAGNOSIS — R05 Cough: Secondary | ICD-10-CM

## 2019-06-06 DIAGNOSIS — Z20822 Contact with and (suspected) exposure to covid-19: Secondary | ICD-10-CM

## 2019-06-06 DIAGNOSIS — R059 Cough, unspecified: Secondary | ICD-10-CM

## 2019-06-06 MED ORDER — BUDESONIDE-FORMOTEROL FUMARATE 160-4.5 MCG/ACT IN AERO: 2.0000 | INHALATION_SPRAY | Freq: Two times a day (BID) | RESPIRATORY_TRACT | 0 refills | Status: DC

## 2019-06-06 MED ORDER — BENZONATATE 100 MG PO CAPS: 100.0000 mg | ORAL_CAPSULE | Freq: Two times a day (BID) | ORAL | 0 refills | Status: DC | PRN
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
## 2019-06-07 ENCOUNTER — Emergency Department (HOSPITAL_COMMUNITY)
Admission: EM | Admit: 2019-06-07 | Discharge: 2019-06-07 | Disposition: A | Discharge status: Home / Self Care | Payer: Medicaid Other | Attending: Emergency Medicine | Admitting: Emergency Medicine

## 2019-06-07 ENCOUNTER — Emergency Department (HOSPITAL_COMMUNITY): Payer: Medicaid Other

## 2019-06-07 ENCOUNTER — Telehealth (HOSPITAL_COMMUNITY): Payer: Self-pay

## 2019-06-07 ENCOUNTER — Encounter (HOSPITAL_COMMUNITY): Payer: Self-pay

## 2019-06-07 ENCOUNTER — Other Ambulatory Visit: Payer: Self-pay

## 2019-06-07 DIAGNOSIS — R079 Chest pain, unspecified: Secondary | ICD-10-CM | POA: Diagnosis present

## 2019-06-07 DIAGNOSIS — Z79899 Other long term (current) drug therapy: Secondary | ICD-10-CM | POA: Diagnosis not present

## 2019-06-07 DIAGNOSIS — F1210 Cannabis abuse, uncomplicated: Secondary | ICD-10-CM | POA: Diagnosis not present

## 2019-06-07 DIAGNOSIS — F17210 Nicotine dependence, cigarettes, uncomplicated: Secondary | ICD-10-CM | POA: Insufficient documentation

## 2019-06-07 LAB — BASIC METABOLIC PANEL
Anion gap: 8 (ref 5–15)
BUN: 11 mg/dL (ref 6–20)
CO2: 26 mmol/L (ref 22–32)
Calcium: 9.9 mg/dL (ref 8.9–10.3)
Chloride: 104 mmol/L (ref 98–111)
Creatinine, Ser: 0.83 mg/dL (ref 0.44–1.00)
GFR calc Af Amer: >60 mL/min (ref >60)
GFR calc non Af Amer: >60 mL/min (ref >60)
Glucose, Bld: 95 mg/dL (ref 70–99)
Potassium: 4 mmol/L (ref 3.5–5.1)
Sodium: 138 mmol/L (ref 135–145)

## 2019-06-07 LAB — CBC
HCT: 42.3 % (ref 36.0–46.0)
Hemoglobin: 13.5 g/dL (ref 12.0–15.0)
MCH: 28.7 pg (ref 26.0–34.0)
MCHC: 31.9 g/dL (ref 30.0–36.0)
MCV: 90 fL (ref 80.0–100.0)
Platelets: 274 10*3/uL (ref 150–400)
RBC: 4.7 MIL/uL (ref 3.87–5.11)
RDW: 14.2 % (ref 11.5–15.5)
WBC: 7.3 10*3/uL (ref 4.0–10.5)
nRBC: 0 % (ref 0.0–0.2)

## 2019-06-07 LAB — TROPONIN I (HIGH SENSITIVITY)
Troponin I (High Sensitivity): <2 ng/L (ref <18)
Troponin I (High Sensitivity): <2 ng/L (ref <18)

## 2019-06-07 MED ORDER — SODIUM CHLORIDE 0.9% FLUSH: 3.0000 mL | Freq: Once | INTRAVENOUS | Status: DC

## 2019-06-07 NOTE — Telephone Encounter (Signed)
Patient called and stated that she's feeling very weak and has pain and pressure in her chest. She also stated that she's disoriented and confused, losing coordination and has been very depressed. She stated that it's been going on for a while now and getting worse. She stated that she doesn't know if it has anything to do with her medications she's taking and has a scheduled appointment for 06/16/19. I advised her to go to emergency right away if the pain and pressure in her chest persists so she can get an EKG done and whatever else would be needed (she's going to check with her insurance to see if it would be covered) and to definitely keep her appointment with you. Please review and advise. Thank you.

## 2019-06-07 NOTE — ED Triage Notes (Signed)
Pt presents to ED with complaints of left sided chest pain x couple of months. Pt c/o SOB with pain.

## 2019-06-07 NOTE — Discharge Instructions (Signed)
Ibuprofen 600 mg every 6 hours as needed for pain.  Follow-up with cardiology if your symptoms persist.  The contact information for the cardiology clinic for both [place] have been provided in this discharge summary for you to call and make these arrangements.  Return to the ER if symptoms significantly worsen or change.

## 2019-06-07 NOTE — ED Provider Notes (Signed)
[facility] EMERGENCY DEPARTMENT Provider Note   CSN: [id number] Arrival date & time: 06/07/19  1605     History   Chief Complaint Chief Complaint  Patient presents with   Chest Pain    HPI Mistie Adney is a 28 y.o. female.     Patient is a 28 year old female with past medical history of anxiety, bipolar, depression, ADHD, and PTSD.  She presents today for evaluation of chest discomfort.  According to the patient she has had this for approximately 6 months, however it has been worse over the past 2 weeks.  She describes a pressure to the center of her chest that comes and goes at random and does not related with exertion.  She denies any shortness of breath, nausea, diaphoresis, or radiation into her arm.  The history is provided by the patient.  Chest Pain Pain location:  Substernal area Pain quality: pressure   Pain radiates to:  Does not radiate Pain severity:  Moderate Duration:  6 months Timing:  Intermittent Progression:  Worsening Relieved by:  Nothing Worsened by:  Nothing   Past Medical History:  Diagnosis Date   ADHD    As a child   Anxiety    Bipolar 1 disorder (HCC)    Depression    Fatty liver    OCD (obsessive compulsive disorder)    PTSD (post-traumatic stress disorder)    Pyloric stenosis     Patient Active Problem List   Diagnosis Date Noted   Numbness and tingling of both feet 04/28/2019   Bilateral hand numbness 04/28/2019   Borderline personality disorder ([place]) 04/08/2019   Bipolar 1 disorder, mixed, moderate ([place]) 03/21/2019   Tobacco use disorder 03/21/2019   Confusion 03/11/2019   Bipolar disorder ([place]) 11/21/2018   Cannabis use disorder, mild, abuse    B12 deficiency 10/11/2018   Vitamin D deficiency 10/11/2018   Columnar epithelial-lined lower esophagus    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    Abdominal pain, epigastric    Rectal polyp    Melena    Severe anxiety 03/19/2018   Chronic  post-traumatic stress disorder (PTSD)    PTSD (post-traumatic stress disorder) 12/31/2017   OCD (obsessive compulsive disorder) 12/31/2017   ADHD 12/31/2017   Prediabetes 12/31/2017    Past Surgical History:  Procedure Laterality Date   APPENDECTOMY  02/17/2014   COLONOSCOPY WITH PROPOFOL N/A 09/03/2018   Procedure: COLONOSCOPY WITH PROPOFOL;  Surgeon: Virgel Manifold, MD;  Location: [facility] ENDOSCOPY;  Service: Endoscopy;  Laterality: N/A;   ESOPHAGOGASTRODUODENOSCOPY (EGD) WITH PROPOFOL N/A 09/03/2018   Procedure: ESOPHAGOGASTRODUODENOSCOPY (EGD) WITH PROPOFOL;  Surgeon: Virgel Manifold, MD;  Location: [facility] ENDOSCOPY;  Service: Endoscopy;  Laterality: N/A;   EUS N/A 10/14/2018   Procedure: FULL UPPER ENDOSCOPIC ULTRASOUND (EUS) RADIAL;  Surgeon: Jola Schmidt, MD;  Location: [facility] ENDOSCOPY;  Service: Endoscopy;  Laterality: N/A;   pyloric stenosis repair       OB History    Gravida  0   Para  0   Term  0   Preterm  0   AB  0   Living  0     SAB  0   TAB  0   Ectopic  0   Multiple  0   Live Births  0            Home Medications    Prior to Admission medications   Medication Sig Start Date End Date Taking? Authorizing Provider  benzonatate (TESSALON) 100 MG capsule Take  1-2 capsules (100-200 mg total) by mouth 2 (two) times daily as needed. 06/06/19   Alba CorySowles, Krichna, MD  budesonide-formoterol (SYMBICORT) 160-4.5 MCG/ACT inhaler Inhale 2 puffs into the lungs 2 (two) times daily. 06/06/19   Alba CorySowles, Krichna, MD  cariprazine (VRAYLAR) capsule Take 1 capsule (3 mg total) by mouth daily. 05/31/19   Pucilowski, Roosvelt Maserlgierd A, MD  Cholecalciferol (VITAMIN D) 50 MCG (2000 UT) tablet Take by mouth.    [provider]  ciclopirox (PENLAC) 8 % solution Apply topically at bedtime. Apply over nail and surrounding skin. Apply daily over previous coat. After seven (7) days, may remove with alcohol and continue cycle. 03/04/19   Doren CustardBoyce, Emily E, FNP  clonazePAM  (KLONOPIN) 0.5 MG tablet Take 0.5 tablets (0.25 mg total) by mouth 2 (two) times daily. 05/31/19   Pucilowski, Roosvelt Maserlgierd A, MD  lamoTRIgine (LAMICTAL) 100 MG tablet Take 1 tablet (100 mg total) by mouth at bedtime. 05/02/19 06/01/19  Pucilowski, Roosvelt Maserlgierd A, MD  lamoTRIgine (LAMICTAL) 200 MG tablet Take 1 tablet (200 mg total) by mouth daily. 05/02/19   Pucilowski, Roosvelt Maserlgierd A, MD  Melatonin 1 MG CAPS Take by mouth.    [provider]  Multiple Vitamin (MULTI-VITAMIN) tablet Take by mouth.    [provider]  Omega-3 1000 MG CAPS Take by mouth.    [provider]  omeprazole (PRILOSEC) 20 MG capsule Take 1 capsule (20 mg total) by mouth daily. 06/01/19   Pasty Spillersahiliani, Varnita B, MD  polyethylene glycol powder (GLYCOLAX/MIRALAX) 17 GM/SCOOP powder Take 255 g by mouth daily. 06/01/19 08/30/19  Pasty Spillersahiliani, Varnita B, MD  polyethylene glycol powder (GLYCOLAX/MIRALAX) 17 GM/SCOOP powder Take one cap full of Miralax daily 06/01/19   Pasty Spillersahiliani, Varnita B, MD  pyridOXINE (VITAMIN B-6) 100 MG tablet TK 1 T PO ONCE D 04/29/19   Pucilowski, Roosvelt Maserlgierd A, MD    Family History Family History  Problem Relation Age of Onset   COPD Mother    Hypertension Mother    Asthma Mother    Arthritis Mother    Pancreatic cancer Mother        slow growing   ADD / ADHD Mother    Other Mother        Spinal Stenosis - currently in surgery today   Bipolar disorder Mother    Anxiety disorder Mother    Depression Mother    Bipolar disorder Father    Arthritis Father    Hypercholesterolemia Father    Asthma Brother    Hernia Brother    ADD / ADHD Brother    Bipolar disorder Brother    Hypertension Maternal Grandmother    Heart disease Maternal Grandmother 27   Rheumatic fever Maternal Grandmother    Heart attack Maternal Grandmother        Bypass Surgery   Pancreatic cancer Maternal Grandfather    Liver cancer Maternal Grandfather    Obesity Paternal Grandmother     Social  History Social History   Tobacco Use   Smoking status: Current Some Day Smoker    Years: 0.50    Start date: 01/01/2008   Smokeless tobacco: Never Used   Tobacco comment: Hookah  Substance Use Topics   Alcohol use: Yes    Comment: occasionally drink a beer   Drug use: Yes    Types: Marijuana    Comment: cocaine in the past but not currently      Allergies   Vyvanse [lisdexamfetamine]   Review of Systems Review of Systems  Cardiovascular: Positive  for chest pain.  All other systems reviewed and are negative.    Physical Exam Updated Vital Signs BP 108/83 (BP Location: Right Arm)    Pulse 76    Temp 98.5 F (36.9 C) (Oral)    Resp 18    Ht 4\' 11"  (1.499 m)    Wt 78.5 kg    LMP 05/12/2019    SpO2 97%    BMI 34.94 kg/m   Physical Exam Vitals signs and nursing note reviewed.  Constitutional:      General: She is not in acute distress.    Appearance: She is well-developed. She is not diaphoretic.  HENT:     Head: Normocephalic and atraumatic.  Neck:     Musculoskeletal: Normal range of motion and neck supple.  Cardiovascular:     Rate and Rhythm: Normal rate and regular rhythm.     Heart sounds: No murmur. No friction rub. No gallop.   Pulmonary:     Effort: Pulmonary effort is normal. No respiratory distress.     Breath sounds: Normal breath sounds. No wheezing.  Abdominal:     General: Bowel sounds are normal. There is no distension.     Palpations: Abdomen is soft.     Tenderness: There is no abdominal tenderness.  Musculoskeletal: Normal range of motion.     Right lower leg: She exhibits no tenderness. No edema.     Left lower leg: She exhibits no tenderness. No edema.  Skin:    General: Skin is warm and dry.  Neurological:     Mental Status: She is alert and oriented to person, place, and time.      ED Treatments / Results  Labs (all labs ordered are listed, but only abnormal results are displayed) Labs Reviewed  BASIC METABOLIC PANEL  CBC    POC URINE PREG, ED  TROPONIN I (HIGH SENSITIVITY)  TROPONIN I (HIGH SENSITIVITY)    EKG ED ECG REPORT   Date: 06/07/2019  Rate: 81  Rhythm: normal sinus rhythm  QRS Axis: normal  Intervals: normal  ST/T Wave abnormalities: normal  Conduction Disutrbances:none  Narrative Interpretation:   Old EKG Reviewed: none available  I have personally reviewed the EKG tracing and disagree with the computerized printout as noted.   Radiology Dg Chest 2 View  Result Date: 06/07/2019 CLINICAL DATA:  Chest pain EXAM: CHEST - 2 VIEW COMPARISON:  08/26/2018 chest radiograph. FINDINGS: Stable cardiomediastinal silhouette with normal heart size. No pneumothorax. No pleural effusion. Lungs appear clear, with no acute consolidative airspace disease and no pulmonary edema. IMPRESSION: No active cardiopulmonary disease. Electronically Signed   By: 08/28/2018 M.D.   On: 06/07/2019 18:58    Procedures Procedures (including critical care time)  Medications Ordered in ED Medications  sodium chloride flush (NS) 0.9 % injection 3 mL (3 mLs Intravenous Not Given 06/07/19 2012)     Initial Impression / Assessment and Plan / ED Course  I have reviewed the triage vital signs and the nursing notes.  Pertinent labs & imaging results that were available during my care of the patient were reviewed by me and considered in my medical decision making (see chart for details).  Patient presenting with complaints of chest discomfort for the past 6 months, but worse over the past 2 years.  Patient's symptoms are very atypical for cardiac pain and her work-up is unremarkable.  At this point, I see no indication for further work-up.  Patient will be discharged.  If she continues with  symptoms, I have advised her to follow-up with cardiology.  Final Clinical Impressions(s) / ED Diagnoses   Final diagnoses:  None    ED Discharge Orders    None       Geoffery Lyons, MD 06/07/19 2133

## 2019-06-08 LAB — NOVEL CORONAVIRUS, NAA: SARS-CoV-2, NAA: NOT DETECTED

## 2019-06-08 NOTE — Telephone Encounter (Signed)
Her symptoms are not consistent with known adverse effects of her meds - newly started on a low dose of Vraylar. It is possible she will need a further dose increase but this should/can wait till her appointment next week.

## 2019-06-13 NOTE — Telephone Encounter (Signed)
Relayed message and stated that she's ok since getting out of the hospital. Will keep her appointment on 11/5 and will discuss things with doctor

## 2019-06-16 ENCOUNTER — Ambulatory Visit (INDEPENDENT_AMBULATORY_CARE_PROVIDER_SITE_OTHER): Payer: Medicaid Other | Admitting: Psychiatry

## 2019-06-16 ENCOUNTER — Other Ambulatory Visit: Payer: Self-pay

## 2019-06-16 DIAGNOSIS — F603 Borderline personality disorder: Secondary | ICD-10-CM

## 2019-06-16 DIAGNOSIS — F900 Attention-deficit hyperactivity disorder, predominantly inattentive type: Secondary | ICD-10-CM

## 2019-06-16 DIAGNOSIS — F3132 Bipolar disorder, current episode depressed, moderate: Secondary | ICD-10-CM

## 2019-06-16 MED ORDER — LAMOTRIGINE 200 MG PO TABS: 200.0000 mg | ORAL_TABLET | Freq: Every day | ORAL | 0 refills | Status: DC

## 2019-06-16 MED ORDER — VITAMIN B-6 100 MG PO TABS: 100.0000 mg | ORAL_TABLET | Freq: Every day | ORAL | 0 refills | Status: DC

## 2019-06-16 MED ORDER — CARIPRAZINE HCL 3 MG PO CAPS: 3.0000 mg | ORAL_CAPSULE | Freq: Every day | ORAL | 0 refills | Status: DC

## 2019-06-16 MED ORDER — METHYLPHENIDATE HCL ER (OSM) 27 MG PO TBCR: 27.0000 mg | EXTENDED_RELEASE_TABLET | ORAL | 0 refills | Status: DC

## 2019-06-16 MED ORDER — CLONAZEPAM 0.5 MG PO TABS: 0.5000 mg | ORAL_TABLET | Freq: Three times a day (TID) | ORAL | 0 refills | Status: DC | PRN
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
## 2019-06-21 ENCOUNTER — Telehealth (HOSPITAL_COMMUNITY): Payer: Self-pay

## 2019-06-21 MED ORDER — OMEPRAZOLE 40 MG PO CPDR: 40.0000 mg | DELAYED_RELEASE_CAPSULE | Freq: Every day | ORAL | 0 refills | Status: DC

## 2019-06-21 NOTE — Telephone Encounter (Signed)
I called patient back and she is willing to stop the Concerta to see if her symptoms get better, however she wants you to know that the paranoia was present before starting Concerta, she just never told you.

## 2019-06-21 NOTE — Telephone Encounter (Signed)
If it does not resolve we will increase Vraylar to 4.5 mg.

## 2019-06-21 NOTE — Telephone Encounter (Signed)
I think she should stop/hold Concerta - if paranoia subsides it means that stimulants may not be good for her (she is on a low 27 mg dose).

## 2019-06-21 NOTE — Telephone Encounter (Signed)
Patient calling to report she has been having paranoia, this has been going on for the last month. Patient did not report it at her appointment because she thought that it was just her normal, but now she thinks it may be medication related. Please review and advise, thank you

## 2019-06-23 ENCOUNTER — Telehealth (HOSPITAL_COMMUNITY): Payer: Self-pay | Admitting: *Deleted

## 2019-06-23 ENCOUNTER — Ambulatory Visit: Payer: Self-pay

## 2019-06-23 NOTE — Telephone Encounter (Signed)
Return pt phone call regarding medication side effects. No answer. Message left for pt.

## 2019-06-23 NOTE — Telephone Encounter (Signed)
Returned call to patient who states that she has a lump about the size of a quarter to the back of her head.  She has not fallen or injured her head.  She states that it is painful to touch and pressure.  She rates pain at 4. She has had a friend examine the area and she reports no redness and no open area for evidence of a bug bite. Patient states she has no other symptoms. Care advice read to patient. Call transferred to office for scheduling.  Reason for Disposition . [1] Swelling is painful to touch AND [2] no fever  Answer Assessment - Initial Assessment Questions 1. APPEARANCE of SWELLING: "What does it look like?" (e.g., lymph node, insect bite, mole)     About the size of quarter grown since 9th 2. SIZE: "How large is the swelling?" (inches, cm or compare to coins)     Quarter size 3. LOCATION: "Where is the swelling located?"     Back of head above hair line 3 fingers above neck 4. ONSET: "When did the swelling start?"     9th of Nov 5. PAIN: "Is it painful?" If so, ask: "How much?"     4 with pressure 6. ITCH: "Does it itch?" If so, ask: "How much?"    no 7. CAUSE: "What do you think caused the swelling?"     unsure 8. OTHER SYMPTOMS: "Do you have any other symptoms?" (e.g., fever)     nothing  Protocols used: SKIN LUMP OR LOCALIZED SWELLING-A-AH

## 2019-06-24 ENCOUNTER — Ambulatory Visit: Payer: Medicaid Other | Admitting: Family Medicine

## 2019-06-28 ENCOUNTER — Ambulatory Visit: Payer: Medicaid Other | Admitting: Family Medicine

## 2019-06-29 ENCOUNTER — Other Ambulatory Visit: Payer: Self-pay | Admitting: Family Medicine

## 2019-06-29 ENCOUNTER — Telehealth (HOSPITAL_COMMUNITY): Payer: Self-pay | Admitting: *Deleted

## 2019-06-29 ENCOUNTER — Encounter: Payer: Self-pay | Admitting: Family Medicine

## 2019-06-29 NOTE — Telephone Encounter (Signed)
Pt called concerning increase in lability of mood and described herself as "really manic". Pt asking if Klonopin can be increased or changed to 1mg  TID and then 0.5 QHS. Pt speech slightly pressured. Pt appears hypomanic. Please review and advise.

## 2019-06-30 ENCOUNTER — Ambulatory Visit: Payer: Self-pay | Admitting: Gastroenterology

## 2019-06-30 NOTE — Telephone Encounter (Signed)
Pt left message regarding feeling "worse" and "not doing well". writer left message for pt. Awaiting return call from pt. Please review and advise.

## 2019-07-04 ENCOUNTER — Other Ambulatory Visit: Payer: Self-pay

## 2019-07-04 ENCOUNTER — Ambulatory Visit (INDEPENDENT_AMBULATORY_CARE_PROVIDER_SITE_OTHER): Payer: Medicaid Other | Admitting: Psychiatry

## 2019-07-04 ENCOUNTER — Telehealth (HOSPITAL_COMMUNITY): Payer: Self-pay | Admitting: *Deleted

## 2019-07-04 DIAGNOSIS — F902 Attention-deficit hyperactivity disorder, combined type: Secondary | ICD-10-CM | POA: Diagnosis not present

## 2019-07-04 DIAGNOSIS — F603 Borderline personality disorder: Secondary | ICD-10-CM

## 2019-07-04 DIAGNOSIS — F3162 Bipolar disorder, current episode mixed, moderate: Secondary | ICD-10-CM

## 2019-07-04 DIAGNOSIS — F4312 Post-traumatic stress disorder, chronic: Secondary | ICD-10-CM

## 2019-07-04 MED ORDER — CLONAZEPAM 1 MG PO TABS: 1.0000 mg | ORAL_TABLET | Freq: Three times a day (TID) | ORAL | 0 refills | Status: DC | PRN
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
## 2019-07-04 NOTE — Telephone Encounter (Signed)
Pt requesting 90 day refill of her Lamictal and Vraylar.

## 2019-07-14 ENCOUNTER — Other Ambulatory Visit: Payer: Self-pay

## 2019-07-14 ENCOUNTER — Ambulatory Visit (INDEPENDENT_AMBULATORY_CARE_PROVIDER_SITE_OTHER): Payer: Medicaid Other | Admitting: Psychiatry

## 2019-07-14 DIAGNOSIS — F4312 Post-traumatic stress disorder, chronic: Secondary | ICD-10-CM

## 2019-07-14 DIAGNOSIS — F900 Attention-deficit hyperactivity disorder, predominantly inattentive type: Secondary | ICD-10-CM | POA: Diagnosis not present

## 2019-07-14 DIAGNOSIS — F3161 Bipolar disorder, current episode mixed, mild: Secondary | ICD-10-CM | POA: Diagnosis not present

## 2019-07-14 MED ORDER — VITAMIN B-6 100 MG PO TABS: 100.0000 mg | ORAL_TABLET | Freq: Every day | ORAL | 0 refills | Status: AC

## 2019-07-14 MED ORDER — LAMOTRIGINE 200 MG PO TABS: 200.0000 mg | ORAL_TABLET | Freq: Two times a day (BID) | ORAL | 0 refills | Status: DC
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
## 2019-07-15 ENCOUNTER — Telehealth: Payer: Self-pay | Admitting: Gastroenterology

## 2019-07-15 NOTE — Telephone Encounter (Signed)
error 

## 2019-07-15 NOTE — Telephone Encounter (Signed)
Pt left vm for [name] to remove her $25 no show fee due toi being in a facility and unable to make her apt.

## 2019-07-18 ENCOUNTER — Ambulatory Visit: Payer: Medicaid Other | Admitting: Family Medicine

## 2019-07-18 ENCOUNTER — Encounter: Payer: Self-pay | Admitting: Family Medicine

## 2019-07-18 ENCOUNTER — Other Ambulatory Visit (HOSPITAL_COMMUNITY)
Admission: RE | Admit: 2019-07-18 | Discharge: 2019-07-18 | Disposition: A | Discharge status: Home / Self Care | Payer: Medicaid Other | Source: Ambulatory Visit | Attending: Family Medicine | Admitting: Family Medicine

## 2019-07-18 ENCOUNTER — Other Ambulatory Visit: Payer: Self-pay

## 2019-07-18 VITALS — BP 118/68 | HR 88 | Temp 97.3°F | Resp 16 | Ht 60.0 in | Wt 173.7 lb

## 2019-07-18 DIAGNOSIS — F312 Bipolar disorder, current episode manic severe with psychotic features: Secondary | ICD-10-CM

## 2019-07-18 DIAGNOSIS — K760 Fatty (change of) liver, not elsewhere classified: Secondary | ICD-10-CM

## 2019-07-18 DIAGNOSIS — Z113 Encounter for screening for infections with a predominantly sexual mode of transmission: Secondary | ICD-10-CM | POA: Insufficient documentation

## 2019-07-18 DIAGNOSIS — R7303 Prediabetes: Secondary | ICD-10-CM

## 2019-07-18 DIAGNOSIS — E559 Vitamin D deficiency, unspecified: Secondary | ICD-10-CM

## 2019-07-18 DIAGNOSIS — M25561 Pain in right knee: Secondary | ICD-10-CM

## 2019-07-18 DIAGNOSIS — K219 Gastro-esophageal reflux disease without esophagitis: Secondary | ICD-10-CM
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
## 2019-07-18 NOTE — Patient Instructions (Signed)
Journal for Nurse Practitioners, 15(4), 263-267. Retrieved May 17, 2018 from [email or web address].com/nursing">  Knee Exercises Ask your health care provider which exercises are safe for you. Do exercises exactly as told by your health care provider and adjust them as directed. It is normal to feel mild stretching, pulling, tightness, or discomfort as you do these exercises. Stop right away if you feel sudden pain or your pain gets worse. Do not begin these exercises until told by your health care provider. Stretching and range-of-motion exercises These exercises warm up your muscles and joints and improve the movement and flexibility of your knee. These exercises also help to relieve pain and swelling. Knee extension, prone 1. Lie on your abdomen (prone position) on a bed. 2. Place your left / right knee just beyond the edge of the surface so your knee is not on the bed. You can put a towel under your left / right thigh just above your kneecap for comfort. 3. Relax your leg muscles and allow gravity to straighten your knee (extension). You should feel a stretch behind your left / right knee. 4. Hold this position for __________ seconds. 5. Scoot up so your knee is supported between repetitions. Repeat __________ times. Complete this exercise __________ times a day. Knee flexion, active  1. Lie on your back with both legs straight. If this causes back discomfort, bend your left / right knee so your foot is flat on the floor. 2. Slowly slide your left / right heel back toward your buttocks. Stop when you feel a gentle stretch in the front of your knee or thigh (flexion). 3. Hold this position for __________ seconds. 4. Slowly slide your left / right heel back to the starting position. Repeat __________ times. Complete this exercise __________ times a day. Quadriceps stretch, prone  1. Lie on your abdomen on a firm surface, such as a bed or padded floor. 2. Bend your left / right knee and hold  your ankle. If you cannot reach your ankle or pant leg, loop a belt around your foot and grab the belt instead. 3. Gently pull your heel toward your buttocks. Your knee should not slide out to the side. You should feel a stretch in the front of your thigh and knee (quadriceps). 4. Hold this position for __________ seconds. Repeat __________ times. Complete this exercise __________ times a day. Hamstring, supine 1. Lie on your back (supine position). 2. Loop a belt or towel over the ball of your left / right foot. The ball of your foot is on the walking surface, right under your toes. 3. Straighten your left / right knee and slowly pull on the belt to raise your leg until you feel a gentle stretch behind your knee (hamstring). ? Do not let your knee bend while you do this. ? Keep your other leg flat on the floor. 4. Hold this position for __________ seconds. Repeat __________ times. Complete this exercise __________ times a day. Strengthening exercises These exercises build strength and endurance in your knee. Endurance is the ability to use your muscles for a long time, even after they get tired. Quadriceps, isometric This exercise stretches the muscles in front of your thigh (quadriceps) without moving your knee joint (isometric). 1. Lie on your back with your left / right leg extended and your other knee bent. Put a rolled towel or small pillow under your knee if told by your health care provider. 2. Slowly tense the muscles in the front of your left /   right thigh. You should see your kneecap slide up toward your hip or see increased dimpling just above the knee. This motion will push the back of the knee toward the floor. 3. For __________ seconds, hold the muscle as tight as you can without increasing your pain. 4. Relax the muscles slowly and completely. Repeat __________ times. Complete this exercise __________ times a day. Straight leg raises This exercise stretches the muscles in front  of your thigh (quadriceps) and the muscles that move your hips (hip flexors). 1. Lie on your back with your left / right leg extended and your other knee bent. 2. Tense the muscles in the front of your left / right thigh. You should see your kneecap slide up or see increased dimpling just above the knee. Your thigh may even shake a bit. 3. Keep these muscles tight as you raise your leg 4-6 inches (10-15 cm) off the floor. Do not let your knee bend. 4. Hold this position for __________ seconds. 5. Keep these muscles tense as you lower your leg. 6. Relax your muscles slowly and completely after each repetition. Repeat __________ times. Complete this exercise __________ times a day. Hamstring, isometric 1. Lie on your back on a firm surface. 2. Bend your left / right knee about __________ degrees. 3. Dig your left / right heel into the surface as if you are trying to pull it toward your buttocks. Tighten the muscles in the back of your thighs (hamstring) to "dig" as hard as you can without increasing any pain. 4. Hold this position for __________ seconds. 5. Release the tension gradually and allow your muscles to relax completely for __________ seconds after each repetition. Repeat __________ times. Complete this exercise __________ times a day. Hamstring curls If told by your health care provider, do this exercise while wearing ankle weights. Begin with __________ lb weights. Then increase the weight by 1 lb (0.5 kg) increments. Do not wear ankle weights that are more than __________ lb. 1. Lie on your abdomen with your legs straight. 2. Bend your left / right knee as far as you can without feeling pain. Keep your hips flat against the floor. 3. Hold this position for __________ seconds. 4. Slowly lower your leg to the starting position. Repeat __________ times. Complete this exercise __________ times a day. Squats This exercise strengthens the muscles in front of your thigh and knee  (quadriceps). 1. Stand in front of a table, with your feet and knees pointing straight ahead. You may rest your hands on the table for balance but not for support. 2. Slowly bend your knees and lower your hips like you are going to sit in a chair. ? Keep your weight over your heels, not over your toes. ? Keep your lower legs upright so they are parallel with the table legs. ? Do not let your hips go lower than your knees. ? Do not bend lower than told by your health care provider. ? If your knee pain increases, do not bend as low. 3. Hold the squat position for __________ seconds. 4. Slowly push with your legs to return to standing. Do not use your hands to pull yourself to standing. Repeat __________ times. Complete this exercise __________ times a day. Wall slides This exercise strengthens the muscles in front of your thigh and knee (quadriceps). 1. Lean your back against a smooth wall or door, and walk your feet out 18-24 inches (46-61 cm) from it. 2. Place your feet hip-width apart. 3.   Slowly slide down the wall or door until your knees bend __________ degrees. Keep your knees over your heels, not over your toes. Keep your knees in line with your hips. 4. Hold this position for __________ seconds. Repeat __________ times. Complete this exercise __________ times a day. Straight leg raises This exercise strengthens the muscles that rotate the leg at the hip and move it away from your body (hip abductors). 1. Lie on your side with your left / right leg in the top position. Lie so your head, shoulder, knee, and hip line up. You may bend your bottom knee to help you keep your balance. 2. Roll your hips slightly forward so your hips are stacked directly over each other and your left / right knee is facing forward. 3. Leading with your heel, lift your top leg 4-6 inches (10-15 cm). You should feel the muscles in your outer hip lifting. ? Do not let your foot drift forward. ? Do not let your knee  roll toward the ceiling. 4. Hold this position for __________ seconds. 5. Slowly return your leg to the starting position. 6. Let your muscles relax completely after each repetition. Repeat __________ times. Complete this exercise __________ times a day. Straight leg raises This exercise stretches the muscles that move your hips away from the front of the pelvis (hip extensors). 1. Lie on your abdomen on a firm surface. You can put a pillow under your hips if that is more comfortable. 2. Tense the muscles in your buttocks and lift your left / right leg about 4-6 inches (10-15 cm). Keep your knee straight as you lift your leg. 3. Hold this position for __________ seconds. 4. Slowly lower your leg to the starting position. 5. Let your leg relax completely after each repetition. Repeat __________ times. Complete this exercise __________ times a day. This information is not intended to replace advice given to you by your health care provider. Make sure you discuss any questions you have with your health care provider. Document Released: 06/11/2005 Document Revised: 05/18/2018 Document Reviewed: 05/18/2018 [employer] Patient Education  2020 [employer]  

## 2019-07-19 ENCOUNTER — Other Ambulatory Visit: Payer: Self-pay

## 2019-07-19 ENCOUNTER — Encounter: Payer: Self-pay | Admitting: Family Medicine

## 2019-07-19 ENCOUNTER — Encounter: Payer: Self-pay | Admitting: Adult Health

## 2019-07-19 ENCOUNTER — Ambulatory Visit: Payer: Medicaid Other | Admitting: Adult Health

## 2019-07-19 VITALS — BP 121/81 | HR 91 | Ht 59.0 in | Wt 174.0 lb

## 2019-07-19 DIAGNOSIS — R109 Unspecified abdominal pain: Secondary | ICD-10-CM | POA: Insufficient documentation

## 2019-07-19 DIAGNOSIS — Z30430 Encounter for insertion of intrauterine contraceptive device: Secondary | ICD-10-CM | POA: Insufficient documentation

## 2019-07-19 DIAGNOSIS — R1032 Left lower quadrant pain: Secondary | ICD-10-CM

## 2019-07-19 DIAGNOSIS — N898 Other specified noninflammatory disorders of vagina: Secondary | ICD-10-CM

## 2019-07-19 DIAGNOSIS — N906 Unspecified hypertrophy of vulva: Secondary | ICD-10-CM | POA: Diagnosis not present

## 2019-07-19 DIAGNOSIS — Z3009 Encounter for other general counseling and advice on contraception: Secondary | ICD-10-CM

## 2019-07-19 DIAGNOSIS — F329 Major depressive disorder, single episode, unspecified: Secondary | ICD-10-CM

## 2019-07-19 DIAGNOSIS — R6882 Decreased libido: Secondary | ICD-10-CM

## 2019-07-19 DIAGNOSIS — F32A Depression, unspecified: Secondary | ICD-10-CM | POA: Insufficient documentation

## 2019-07-19 LAB — POCT WET PREP (WET MOUNT)
Clue Cells Wet Prep Whiff POC: NEGATIVE
WBC, Wet Prep HPF POC: NEGATIVE

## 2019-07-19 LAB — POCT URINALYSIS DIPSTICK
Blood, UA: NEGATIVE
Glucose, UA: NEGATIVE
Ketones, UA: NEGATIVE
Leukocytes, UA: NEGATIVE
Nitrite, UA: NEGATIVE
Protein, UA: NEGATIVE

## 2019-07-19 LAB — RPR: RPR Ser Ql: NONREACTIVE

## 2019-07-19 LAB — HIV ANTIBODY (ROUTINE TESTING W REFLEX): HIV 1&2 Ab, 4th Generation: NONREACTIVE

## 2019-07-19 NOTE — Progress Notes (Signed)
Patient ID: Courtney [name], female   DOB: [birth year], 28 y.o.   MRN: [id number] History of Present Illness: Courtney [name] is a 28 year old white female, single, G0P0, she is a new pt and is in with numerous complaints. She has pain in abdomen and pelvic area for last 2 months, it hurts 7/10 but only lasts few seconds. Has had vaginal dryness the last month and has used 3 Plan Bs in last month, has new partner and had STD testing yesterday at PCP. Has creamy discharge and urine smells strong. She thinks she wants an IUD, does not want kids. She also says labia ate too long and get caught and pull. She has history of depression and sees psychiatrist.  PCP is [facility].   Current Medications, Allergies, Past Medical History, Past Surgical History, Family History and Social History were reviewed in [employer] record.     Review of Systems: Patient denies any headaches, hearing loss, fatigue, blurred vision, shortness of breath, chest pain, , problems with bowel movements, urination, or intercourse. No joint pain. See HPI for positives.     Physical Exam:BP 121/81 (BP Location: Left Arm, Patient Position: Sitting, Cuff Size: Normal)   Pulse 91   Ht 4\' 11"  (1.499 m)   Wt 174 lb (78.9 kg)   LMP 07/07/2019   BMI 35.14 kg/m   Urine dipstick is negative. General:  Well developed, well nourished, no acute distress Skin:  Warm and dry,has tattoos Neck:  Midline trachea, normal thyroid, good ROM, no lymphadenopathy Lungs; Clear to auscultation bilaterally Cardiovascular: Regular rate and rhythm Pelvic:  External genitalia: she has thin flat labia minora that are about 2 inches long.  The vagina is normal in appearance, has white creamy discharge, without odor. Urethra has no lesions or masses. The cervix is smooth.  Uterus is felt to be normal size, shape, and contour.  No adnexal masses, LLQ tenderness noted.Bladder is non tender, no masses felt. Wet prep was  normal. Extremities/musculoskeletal:  No swelling or varicosities noted, no clubbing or cyanosis Psych:  No mood changes, alert and cooperative,seems happy Fall risk is low  PHQ 9 score is 12, denies being suicidal. Co exam with Weyman Croon FNP student. Face time 45 minutes with 50 % listening and counseling.  Impression and Plan: 1. Abdominal pain, unspecified abdominal location   2. Hypertrophy of labia minora Return in about 3 weeks to see Dr Glo Herring about labia plasty  3. Vaginal dryness Try  luvena  4. Decreased libido Give it time  5. General counseling and advice for contraceptive management Review handouts on IUDs and consider tubal  6. LLQ pain Return in about 10 days for GYN [name], will talk when results back   7. Vaginal discharge   8. Depression, unspecified depression type Continue with psychiatrist

## 2019-07-20 LAB — CERVICOVAGINAL ANCILLARY ONLY
Chlamydia: NEGATIVE
Comment: NEGATIVE
Comment: NORMAL
Neisseria Gonorrhea: NEGATIVE

## 2019-07-21 ENCOUNTER — Encounter: Payer: Self-pay | Admitting: Family Medicine

## 2019-07-27 ENCOUNTER — Encounter: Payer: Self-pay | Admitting: Family Medicine

## 2019-07-27 ENCOUNTER — Telehealth: Payer: Self-pay | Admitting: Family Medicine

## 2019-07-27 DIAGNOSIS — K760 Fatty (change of) liver, not elsewhere classified: Secondary | ICD-10-CM | POA: Insufficient documentation

## 2019-07-27 NOTE — Telephone Encounter (Signed)
Erroneous

## 2019-07-29 ENCOUNTER — Other Ambulatory Visit: Payer: Self-pay

## 2019-07-29 ENCOUNTER — Ambulatory Visit (INDEPENDENT_AMBULATORY_CARE_PROVIDER_SITE_OTHER): Payer: Medicaid Other

## 2019-07-29 DIAGNOSIS — R1032 Left lower quadrant pain: Secondary | ICD-10-CM | POA: Diagnosis not present

## 2019-07-29 NOTE — Progress Notes (Signed)
PELVIC US TA/TV: homogeneous anteverted uterus,wnl,EEC 9.7 mm,normal ovaries,ovaries appear mobile,no free fluid,no pain during ultrasound   Chaperone Marcie Bal

## 2019-08-03 ENCOUNTER — Ambulatory Visit: Payer: Medicaid Other | Admitting: Obstetrics and Gynecology

## 2019-08-03 ENCOUNTER — Other Ambulatory Visit: Payer: Self-pay

## 2019-08-03 ENCOUNTER — Other Ambulatory Visit: Payer: Self-pay | Admitting: Family Medicine

## 2019-08-03 DIAGNOSIS — R202 Paresthesia of skin: Secondary | ICD-10-CM

## 2019-08-03 MED ORDER — OMEPRAZOLE 40 MG PO CPDR: 40.0000 mg | DELAYED_RELEASE_CAPSULE | Freq: Every day | ORAL | 0 refills | Status: DC

## 2019-08-03 NOTE — Telephone Encounter (Signed)
Last office visit 06/01/19 constipation GERD  Last refill 06/21/2019 0 refills  No appointment is scheduled  Supposed to follow up in 6 months  Patient wants a 51 day supply

## 2019-08-04 ENCOUNTER — Encounter: Payer: Self-pay | Admitting: Family Medicine

## 2019-08-18 ENCOUNTER — Ambulatory Visit (INDEPENDENT_AMBULATORY_CARE_PROVIDER_SITE_OTHER): Payer: Medicaid Other | Admitting: Psychiatry

## 2019-08-18 ENCOUNTER — Other Ambulatory Visit: Payer: Self-pay

## 2019-08-18 DIAGNOSIS — F4312 Post-traumatic stress disorder, chronic: Secondary | ICD-10-CM | POA: Diagnosis not present

## 2019-08-18 DIAGNOSIS — F3161 Bipolar disorder, current episode mixed, mild: Secondary | ICD-10-CM | POA: Diagnosis not present

## 2019-08-18 DIAGNOSIS — F603 Borderline personality disorder: Secondary | ICD-10-CM

## 2019-08-18 DIAGNOSIS — F909 Attention-deficit hyperactivity disorder, unspecified type: Secondary | ICD-10-CM

## 2019-08-18 MED ORDER — METHYLPHENIDATE HCL ER (OSM) 36 MG PO TBCR: 36.0000 mg | EXTENDED_RELEASE_TABLET | ORAL | 0 refills | Status: DC

## 2019-08-18 MED ORDER — CARIPRAZINE HCL 4.5 MG PO CAPS: 4.5000 mg | ORAL_CAPSULE | Freq: Every day | ORAL | 0 refills | Status: DC

## 2019-08-18 MED ORDER — OXCARBAZEPINE 150 MG PO TABS: ORAL_TABLET | ORAL | 0 refills | Status: DC

## 2019-08-18 MED ORDER — CLONAZEPAM 1 MG PO TABS: 1.0000 mg | ORAL_TABLET | Freq: Three times a day (TID) | ORAL | 0 refills | Status: DC | PRN
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
## 2019-08-24 ENCOUNTER — Ambulatory Visit: Payer: Medicaid Other | Admitting: Obstetrics and Gynecology

## 2019-08-25 ENCOUNTER — Ambulatory Visit (INDEPENDENT_AMBULATORY_CARE_PROVIDER_SITE_OTHER): Payer: Medicaid Other | Admitting: Licensed Clinical Social Worker

## 2019-08-25 ENCOUNTER — Encounter: Payer: Self-pay | Admitting: Family Medicine

## 2019-08-25 ENCOUNTER — Other Ambulatory Visit: Payer: Self-pay

## 2019-08-25 DIAGNOSIS — F4312 Post-traumatic stress disorder, chronic: Secondary | ICD-10-CM | POA: Diagnosis not present

## 2019-08-25 DIAGNOSIS — F603 Borderline personality disorder: Secondary | ICD-10-CM | POA: Diagnosis not present

## 2019-08-25 DIAGNOSIS — F909 Attention-deficit hyperactivity disorder, unspecified type: Secondary | ICD-10-CM | POA: Diagnosis not present

## 2019-08-25 DIAGNOSIS — F3161 Bipolar disorder, current episode mixed, mild: Secondary | ICD-10-CM
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
## 2019-08-26 ENCOUNTER — Encounter: Payer: Self-pay | Admitting: Family Medicine

## 2019-08-26 ENCOUNTER — Telehealth (HOSPITAL_COMMUNITY): Payer: Self-pay

## 2019-08-26 NOTE — Telephone Encounter (Signed)
She thinks Lamictal is ineffective for mood stabilization that is why I ordered Trileptal. It is not an anti-pain medication and it is possible that it may interfere with Vraylar she is on making it less effective. I don't know if she is experiencing any side effects from it as it was just started but if she does not want to take it that is fine. We can see if Vraylar alone at a higher dose is not sufficient for her mood (iness).

## 2019-08-26 NOTE — Telephone Encounter (Signed)
Patient called regarding her Oxcarbazepine 150mg . She stated that she's concerned about side effects and interactions with other medications she's taking. She stated that she doesn't want to take pain meds due to her having non-alcoholic fatty liver disease. She stated she's doing CBD oil, gummies, droplets and sometimes capsules. She's also taking a CLA fat booster (thermagetic) for weight loss and alderberry. Please review and advise. Thank you.

## 2019-08-29 DIAGNOSIS — M40202 Unspecified kyphosis, cervical region: Secondary | ICD-10-CM | POA: Insufficient documentation

## 2019-08-30 ENCOUNTER — Telehealth (HOSPITAL_COMMUNITY): Payer: Self-pay | Admitting: *Deleted

## 2019-08-30 NOTE — Telephone Encounter (Signed)
Writer spoke with pt who called c/o feeling "disoriented. 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 Pt also c/o feeling like she's walking like she's drunk. Laxmi does she a Insurance account manager at [facility] Dr. Theora Master. Pt is wondering if her fatty liver disease also may be impacting medication build up or toxicity specifically the Lamictal. Pt has an upcoming appointment in this clinic on 02/29/21. She is asking for your advice on this matter. Please review.Marland Kitchen

## 2019-08-30 NOTE — Telephone Encounter (Signed)
We have talked about this twice at least - she does not remember much unfortunately.  There is limited data on dosing Lamictal in patients with liver disease - in general it is recommended to decrease dose by 25% but what is the right dose for her? Lamictal was prescribed by her neurologist  and I would rather leave decision whether to continue or not and at what dose with him.

## 2019-08-31 ENCOUNTER — Telehealth: Payer: Self-pay | Admitting: Physical Therapy

## 2019-08-31 NOTE — Telephone Encounter (Signed)
Called referring clinician, Dr. Elmon Kirschner office [facility]) to obtain recent lumbar and cervical spine radiograph results. Spoke to [place] who located them and agreed to fax them over. Provided fax number: [phone].   [name]. Ilsa Iha, PT, DPT 08/31/19, 9:39 AM

## 2019-09-01 ENCOUNTER — Ambulatory Visit: Payer: Medicaid Other | Attending: Neurology | Admitting: Physical Therapy

## 2019-09-01 ENCOUNTER — Other Ambulatory Visit: Payer: Self-pay

## 2019-09-01 ENCOUNTER — Encounter: Payer: Self-pay | Admitting: Physical Therapy

## 2019-09-01 DIAGNOSIS — M79605 Pain in left leg: Secondary | ICD-10-CM | POA: Insufficient documentation

## 2019-09-01 DIAGNOSIS — M79601 Pain in right arm: Secondary | ICD-10-CM | POA: Diagnosis present

## 2019-09-01 DIAGNOSIS — M79604 Pain in right leg: Secondary | ICD-10-CM | POA: Insufficient documentation

## 2019-09-01 DIAGNOSIS — G8929 Other chronic pain: Secondary | ICD-10-CM | POA: Insufficient documentation

## 2019-09-01 DIAGNOSIS — M79602 Pain in left arm: Secondary | ICD-10-CM | POA: Insufficient documentation

## 2019-09-01 DIAGNOSIS — M542 Cervicalgia: Secondary | ICD-10-CM | POA: Diagnosis present

## 2019-09-01 DIAGNOSIS — M545 Low back pain, unspecified: Secondary | ICD-10-CM

## 2019-09-01 DIAGNOSIS — R202 Paresthesia of skin: Secondary | ICD-10-CM | POA: Diagnosis present
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
## 2019-09-02 ENCOUNTER — Ambulatory Visit (INDEPENDENT_AMBULATORY_CARE_PROVIDER_SITE_OTHER): Payer: Medicaid Other | Admitting: Licensed Clinical Social Worker

## 2019-09-02 DIAGNOSIS — F4312 Post-traumatic stress disorder, chronic: Secondary | ICD-10-CM

## 2019-09-02 DIAGNOSIS — F603 Borderline personality disorder: Secondary | ICD-10-CM | POA: Diagnosis not present

## 2019-09-02 DIAGNOSIS — F909 Attention-deficit hyperactivity disorder, unspecified type: Secondary | ICD-10-CM | POA: Diagnosis not present

## 2019-09-02 DIAGNOSIS — F3161 Bipolar disorder, current episode mixed, mild: Secondary | ICD-10-CM | POA: Diagnosis not present

## 2019-09-02 NOTE — Progress Notes (Signed)
Virtual Visit via Video Note   I connected with Courtney [name] on 09/02/19 at 11:00am by [facility] video application and verified that I am speaking with the correct person using two identifiers.   I discussed the limitations, risks, security and privacy concerns of performing an evaluation and management service by telephone and the availability of in person appointments. I also discussed with the patient that there may be a patient responsible charge related to this service. The patient expressed understanding and agreed to proceed.   I discussed the assessment and treatment plan with the patient. The patient was provided an opportunity to ask questions and all were answered. The patient agreed with the plan and demonstrated an understanding of the instructions.   The patient was advised to call back or seek an in-person evaluation if the symptoms worsen or if the condition fails to improve as anticipated.   I provided 1 hour of non-face-to-face time during this encounter.     [name], LCSW, LCASA ________________________ THERAPIST PROGRESS NOTE   Session Time: 11:00am - 12:00pm    Participation Level: Active   Behavioral Response: Alert, well groomed, casually dressed, anxious mood/affect    Type of Therapy:  Individual Therapy   Treatment Goals addressed: Self-care routine; anxiety management   Interventions: CBT, mindfulness    Summary: Courtney [name] presented for virtual session today on Web[employer].   She was alert, oriented x5, with no evidence or self-report of SI/HI or A/V H.  Courtney [name] reported scores of 5/10 for depression and 6/10 for anxiety and stated "I guess I feel pretty normal today.  I usually feel down".  Courtney [name] reported that she is compliant with current medication.  She reported progress on exercising by doing yoga at least once per day, spending time with family caring for her grandmother, trying to monitor impulsive spending by avoiding purchase of unneeded  makeup, and also using a new app on her phone that is supposed to improve cognitive function.  Courtney [name] reported that she still struggles with racing thoughts and mania-like symptoms occasionally, and was hopeful to learn techniques to manage these.  Courtney [name] participated in mindful breathing exercise and reported that this made her feel more calm, focused, and relaxed afterward.  She also reported that she still has occasional panic attacks, and had heard of grounding techniques, but did not know of any specific ones.  She participated in 5-4-3-2-1 grounding and reported that it was effective in distracting her and she intends to use both exercises regularly and report back on outcomes.  Courtney [name] reported that she would follow up virtually in 1 week.       Suicidal/Homicidal: None, with out intent or plan   Therapist Response:  Clinician met for virtual session today with Courtney [name].  Clinician inquired about Courtney [name]'s present emotional ratings, as well as any significant changes in her thoughts, feelings, or behavior since last conversation.  Clinician inquired about Courtney [name]'s current progress on goals, as well as any challenges that need addressing.  Clinician praised Courtney [name] on her progress in tackling goals, and offered to demonstrate relaxation and grounding techniques today that she can add to skillset.  Clinician guided Courtney [name] through mindful breathing exercise involving getting comfortable, focusing on achieving relaxed breathing rhythm, and then allowing thoughts and feelings that arise to be acknowledged, but then let go of so that she could stay present minded, focused, and calm.  Clinician also demonstrated 5-4-3-2-1 grounding technique involving having Courtney [name] list 5 things she could see, 4 she could touch,  3 she could hear, 2 she could smell, and 1 she could taste to distract herself from state of panic and successfully regulate emotions.  Clinician will continue to monitor.   Plan:  Meet again in 1 week virtually.   Diagnosis: Bipolar I Disorder, mixed, mild; Borderline personality disorder; PTSD; ADHD, unspecified    [name], [place], [place] 09/02/19

## 2019-09-05 ENCOUNTER — Other Ambulatory Visit: Payer: Self-pay

## 2019-09-05 ENCOUNTER — Telehealth (HOSPITAL_COMMUNITY): Payer: Self-pay

## 2019-09-05 MED ORDER — OMEPRAZOLE 40 MG PO CPDR: 40.0000 mg | DELAYED_RELEASE_CAPSULE | Freq: Every day | ORAL | 0 refills | Status: DC

## 2019-09-05 NOTE — Telephone Encounter (Signed)
Spoke with patient and she thinks she would like to stop the Trileptal for now (per your response to the note from 08/26/19). She would like to try that and then she's going to call me in a week or so to let me know how she's feeling from coming off it. However, she doesn't know if she can stop it cold [place] or is she needs to ween off it? Please review and advise. Thank you.

## 2019-09-05 NOTE — Telephone Encounter (Signed)
Patient called wanting a 90 day supply on the omeprazole. Informed patient I would give her 90 days but she would need follow up appointment after that refill

## 2019-09-05 NOTE — Telephone Encounter (Signed)
I am totally confused by what she says. During her recent visit we have discussed tapering off Lamictal and starting Trileptal instead. I think she needs to be on one or the other - she said she continued to have mood swings on a relatively high dose of Lamictal so it is doubtful it was effective and we decided to try Trileptal. If she started Trileptal yet it is taken at a low 150 mg dose bid. I don't know if she tapered off Lamictal yet. Neither of these meds should be discontinued abruptly as she may has a seizure if she does. Of course I can't make her take what she does not want to but I suspect her mood fluctuations will only intensify of she comes off one or the other (whichever she is actually taking at this time).

## 2019-09-08 ENCOUNTER — Ambulatory Visit: Payer: Medicaid Other | Admitting: Physical Therapy

## 2019-09-08 ENCOUNTER — Other Ambulatory Visit: Payer: Self-pay

## 2019-09-08 DIAGNOSIS — G8929 Other chronic pain: Secondary | ICD-10-CM

## 2019-09-08 DIAGNOSIS — M542 Cervicalgia: Secondary | ICD-10-CM

## 2019-09-08 DIAGNOSIS — R202 Paresthesia of skin: Secondary | ICD-10-CM

## 2019-09-08 DIAGNOSIS — M545 Low back pain, unspecified: Secondary | ICD-10-CM

## 2019-09-08 DIAGNOSIS — M79604 Pain in right leg: Secondary | ICD-10-CM

## 2019-09-08 DIAGNOSIS — M79601 Pain in right arm: Secondary | ICD-10-CM

## 2019-09-08 NOTE — Therapy (Signed)
[facility] PHYS[facility] AND SPOR[facility] MEDICINE [address] [place], [address] Phone: [phone]   Fax:  [phone]  Physical Therapy Treatment  Patient Details  Name: Courtney [name] MRN: [id number] Date of Birth: 1991-05-28 Referring Provider (PT): Theora Master, MD   Encounter Date: 09/08/2019  PT End of Session - 09/08/19 1050    Visit Number  2    Number of Visits  16    Date for PT Re-Evaluation  11/10/19    Authorization Type  Medicaid reporting period from 09/01/2019    Authorization - Visit Number  2    Authorization - Number of Visits  3    PT Start Time  1045    PT Stop Time  1150    PT Time Calculation (min)  65 min    Activity Tolerance  Patient tolerated treatment well    Behavior During Therapy  [facility] for tasks assessed/performed       Past Medical History:  Diagnosis Date  . ADHD    As a child  . Anxiety   . Bipolar 1 disorder (HCC)   . Depression   . Fatty liver   . OCD (obsessive compulsive disorder)   . Personality disorder (HCC)   . PTSD (post-traumatic stress disorder)   . Pyloric stenosis     Past Surgical History:  Procedure Laterality Date  . APPENDECTOMY  02/17/2014  . COLONOSCOPY WITH PROPOFOL N/A 09/03/2018   Procedure: COLONOSCOPY WITH PROPOFOL;  Surgeon: Pasty Spillers, MD;  Location: [facility] ENDOSCOPY;  Service: Endoscopy;  Laterality: N/A;  . ESOPHAGOGASTRODUODENOSCOPY (EGD) WITH PROPOFOL N/A 09/03/2018   Procedure: ESOPHAGOGASTRODUODENOSCOPY (EGD) WITH PROPOFOL;  Surgeon: Pasty Spillers, MD;  Location: [facility] ENDOSCOPY;  Service: Endoscopy;  Laterality: N/A;  . EUS N/A 10/14/2018   Procedure: FULL UPPER ENDOSCOPIC ULTRASOUND (EUS) RADIAL;  Surgeon: Rayann Heman, MD;  Location: [facility] ENDOSCOPY;  Service: Endoscopy;  Laterality: N/A;  . pyloric stenosis repair      There were no vitals filed for this visit.  Subjective Assessment - 09/08/19 1048    Subjective  Patient reports she is  continuing to have discomfort in familiar regions including the neck and back that radiates downs the limbs. She states she got her TENS unit, which is helping her and has been doing some stretches from youtube. She would like to know more about what kyphosis on the cervical spine means and is concerned about 2 lumps she can feel at her low back when she applies biofreeze. She is also concerned that she might have scoliosis because it was in her medical chart and then was removed with the most recent radiograph.    Pertinent History  Patient is a 29 y.o. female who presents to outpatient physical therapy with a referral for medical diagnosis numbness and tingling. This patient's chief complaints consist of burning pain from the base of her neck down both arms, over her entire spine and down both legs with intermittent tingling in her hands and feet she also complains of joint pain especially at the R knee. This leading to the following functional deficits: difficulty working out, decreased quality of life, difficulty tolerating usual tasks due to distraction from pain, describes it causing feeling need to pinch or bite her wrists because she cannot stand the pain. Relevant past medical history and comorbidities include fibromyalgia, bipolar 1 disorder, memory loss/confusion, borderline personality disorder, OCD, PTSD, fatty liver, pyloric stenosis, seizures as a child, ADHD, severe anxiety, pre-diabetes, depression, history  of concussion within the last 6 months, former smoker. Currently sees mental health counselor as well as psychiatrist. States she feels she has adequate mental health support at this time. Denies stroke, lung problems, cardiac problems (except occasional chest pain she associates with anxiety), cancer. MS was already ruled out.    Limitations  Other (comment);House hold activities   difficulty working out, decreased quality of life, difficulty tolerating usual tasks due to distraction from  pain, describes it causing feeling need to pinch or bite her wrists because she cannot stand the pain. makes everything harder   Diagnostic tests  Brain MRI report 10/15/2019: normal, no evidence of demyelinating disease. Lumbar spine radiograph report 07/26/19: "no acue abnormality in lumbar spine" shows L5 transitional verebral body, disc heights maintained. Radiograph report of cervical spine 07/26/2019: "kpyosis in the cervical spien with areas of mild disc space narrowing at c5-6. There appears to be some posterior disc space narrowing at C7-T1.    Patient Stated Goals  to feel better and alleviate the pain and learn some exercises to do at home.    Currently in Pain?  Yes    Pain Score  6     Pain Location  Back   low back is the worst; rest of body is ~ 5/10   Pain Descriptors / Indicators  Burning;Cramping    Pain Onset  More than a month ago   29 years old         [facility] PT Assessment - 09/08/19 0001      Assessment   Medical Diagnosis  numbness and tingling    Referring Provider (PT)  Theora Master, MD    Onset Date/Surgical Date  --   29 years old   Prior Therapy  none for this problem prior to current episode of care      Precautions   Precautions  None      Restrictions   Weight Bearing Restrictions  No      Balance Screen   Has the patient fallen in the past 6 months  Yes    How many times?  1    Has the patient had a decrease in activity level because of a fear of falling?   No    Is the patient reluctant to leave their home because of a fear of falling?   No      Home Environment   Living Environment  --   no concerns about navigating current living environment      Prior Function   Level of Independence  Independent    Vocation  On disability    Leisure  Usually does calisthenics. Has been stretching and doing some yoga. Writing. Hobbies: makeup, cutting hair, cleaning, cooking, writing, cooking      Cognition   Overall Cognitive Status  History of  cognitive impairments - at baseline   able to resolve concerns of patient with time and discussion   Attention  --   anxious and confused and hyperfocused on dx in chart but      Observation/Other Assessments   Observations  see note from 09/01/2019 for latest objective data    Focus on Therapeutic Outcomes (FOTO)   FOTO  = 47 (09/08/2019);        TREATMENT:   Therapeutic exercise:to centralize symptoms and improve ROM, strength, muscular endurance, and activity tolerance required for successful completion of functional activities.  5 min to fill out FOTO questionnaire (unbilled) - Sidelying open book (thoracic rotation) to  improve thoracic, shoulder girdle, and upper trunk mobility. Required instruction for technique and cuing to achieve end range as tolerated, hold time, and breathing technique. x10 reps each side with cuing for breathing and form.  - Education on diagnosis, prognosis, POC, anatomy and physiology of current condition including detailed explanation with spine model describing the spine in relationship to the findings on her lumbar and cervical spine radiograph report.  - performed Adam's flexion test for scoliosis. Per observation and the test she had no signs of clinically significant scoliosis and had even skin folds and space between arms and sides. Explained to patient.  - palpated lumbar spine while applying biofreeze at patient's request and found two areas of soft tissue lumps in the subcutaneous tissue consistent with lipoma. Tender to touch but did not appear concerning at this point. Will continue to monitor.  - clam shell x 5 each side without band, cuing for improved ROM and pelvic stability. Added red theraband for 5-10 reps each side and was well tolerated.  - supine abdominal brace with marching progressing to hooklying dead bug exercise x 10 each side. Cuing to learn activity and maintain trunk stability. Attempted full dead bug, but unable to adequately control  trunk.  - Standing rows with scapular retraction for improved postural and shoulder girdle strengthening and mobility. Required instruction for technique and cuing to retract, posteriorly tilt, and depress scapulae. x10 with red theraband, x 10 with green theraband.  - Education on HEP including handout   Patient required extended time for discussion of form, exercises, condition and concerns about spinal alignment and soft tissue lumps. Also required additional time to discuss scheduling and insurance authorization for successful participation. Pt required multimodal cuing for proper technique and to facilitate improved neuromuscular control, strength, range of motion, and functional ability resulting in improved performance and form.  HOME EXERCISE PROGRAM Access Code: [email or web address]  URL: [email or web address]  Date: 09/08/2019  Prepared by: Norton Blizzard   Exercises Sidelying Thoracic Rotation with Open Book - 10 reps - one breath hold - 1x daily Clam with Resistance - 3 sets - 10 reps - 1x daily Supine Bridge with Resistance Band - 3 sets - 10 reps - 1x daily Supine Dead Bug with Leg Extension - 3 sets - 10 reps - 1x daily Row with band/cable - 3 sets - 10 reps - 1x daily   PT Education - 09/08/19 1050    Education Details  Exercise purpose/form. Self management techniques    Person(s) Educated  Patient    Methods  Explanation;Demonstration;Tactile cues;Verbal cues;Handout    Comprehension  Verbalized understanding;Returned demonstration;Verbal cues required;Tactile cues required       PT Short Term Goals - 09/01/19 2057      PT SHORT TERM GOAL #1   Title  Be independent with initial home exercise program for self-management of symptoms.    Baseline  Initial HEP to be provided 2nd visit (09/01/2019);    Time  3    Period  Weeks    Status  New    Target Date  09/22/19        PT Long Term Goals - 09/01/19 2057      PT LONG TERM GOAL #1   Title  Be independent with  a long-term home exercise program for self-management of symptoms.    Baseline  Initial HEP to be provided at 2nd visit (09/01/2019);    Time  10    Period  Weeks    Status  New  Target Date  11/10/19      PT LONG TERM GOAL #2   Title  Patient will demonstrate improved ability to perform functional tasks as exhibited by at least 10 point improvement in FOTO score    Baseline  deferred to 2nd visit (09/01/2019);    Time  10    Period  Weeks    Status  New    Target Date  11/10/19      PT LONG TERM GOAL #3   Title  Have full cervical spine and lumbar AROM with no compensations or increase in pain in all planes except intermittent end range discomfort to allow patient to complete valued activities with less difficulty.    Baseline  see objective exam - painful and limited (09/01/2019);    Time  10    Period  Weeks    Status  New    Target Date  11/10/19      PT LONG TERM GOAL #4   Title  Complete community, work and/or recreational activities without limitation due to current condition.    Baseline  difficulty working out, decreased quality of life, difficulty tolerating usual tasks due to distraction from pain, describes it causing feeling need to pinch or bite her wrists because she cannot stand the pain. (09/01/2019);    Time  10    Period  Weeks    Status  New    Target Date  11/10/19      PT LONG TERM GOAL #5   Title  Reduce pain with functional activities to equal or less than 2/10 to allow patient to complete usual activities including ADLs, IADLs, and social engagement with less difficulty.    Baseline  8/10 (09/01/2019);    Time  10    Period  Weeks    Status  New    Target Date  11/10/19            Plan - 09/08/19 1213    Clinical Impression Statement  Patient tolerated treatment well overall with no complaints of lasting increase in pain. Patient required extended time for education to address her many concerns and decrease fear of movement and her condition. Lumps  in lumbar spine appeared to be in the sub-cutanious tissue and seem consistent with lipoma - will continue to monitor and refer back to physician as appropriate. Patient appeared relieved following education. Provided patient with initial HEP to work on core, hips, and posture. Patient very slow and deliberate with motions and appeared to have good understanding of proper form by end of session. Patient would benefit from continued management of limiting condition by skilled physical therapist to address remaining impairments and functional limitations to work towards stated goals and return to PLOF or maximal functional independence.    Personal Factors and Comorbidities  Behavior Pattern;Comorbidity 3+;Education;Social Background;Fitness;Time since onset of injury/illness/exacerbation;Age    Comorbidities  Relevant past medical history and comorbidities include fibromyalgia, bipolar 1 disorder, memory loss/confusion, borderline personality disorder, OCD, PTSD, fatty liver, pyloric stenosis, seizures as a child, ADHD, severe anxiety, pre-diabetes, depression, history of concussion within the last 6 months, former smoker.    Examination-Activity Limitations  Other   functional deficits: difficulty working out, decreased quality of life, difficulty tolerating usual tasks due to distraction from pain, describes it causing feeling need to pinch or bite her wrists because she cannot stand the pain.   Examination-Participation Restrictions  Community Activity;Interpersonal Relationship;Other   affects all activities when pain is too high to participate comfortably  Stability/Clinical Decision Making  Evolving/Moderate complexity    Rehab Potential  Fair    PT Frequency  2x / week    PT Duration  Other (comment)   10 weeks   PT Treatment/Interventions  ADLs/Self Care Home Management;Aquatic Therapy;Biofeedback;Cryotherapy;Moist Heat;Traction;Electrical Stimulation;Therapeutic activities;Therapeutic  exercise;Balance training;Neuromuscular re-education;Patient/family education;Cognitive remediation;Manual techniques;Passive range of motion;Dry needling;Spinal Manipulations;Joint Manipulations    PT Next Visit Plan  progress core, postural, LE and UE strengthening and mobility as tolerated    PT Home Exercise Plan  Medbridge Access Code: [email or web address]    Consulted and Agree with Plan of Care  Patient       Patient will benefit from skilled therapeutic intervention in order to improve the following deficits and impairments:  Impaired sensation, Improper body mechanics, Pain, Postural dysfunction, Increased muscle spasms, Decreased activity tolerance, Decreased endurance, Decreased range of motion, Decreased strength, Impaired perceived functional ability, Impaired UE functional use, Obesity, Impaired flexibility, Decreased balance, Difficulty walking  Visit Diagnosis: Cervicalgia  Chronic bilateral low back pain, unspecified whether sciatica present  Pain in both upper extremities  Pain in both lower extremities  Paresthesia of skin     Problem List Patient Active Problem List   Diagnosis Date Noted  . Fatty liver   . General counseling and advice for contraceptive management 07/19/2019  . Decreased libido 07/19/2019  . Vaginal dryness 07/19/2019  . Hypertrophy of labia minora 07/19/2019  . Abdominal pain 07/19/2019  . LLQ pain 07/19/2019  . Depression 07/19/2019  . Vaginal discharge 07/19/2019  . Memory loss or impairment 05/04/2019  . Numbness and tingling of both feet 04/28/2019  . Bilateral hand numbness 04/28/2019  . Borderline personality disorder (HCC) 04/08/2019  . Bipolar 1 disorder, mixed, mild (HCC) 03/21/2019  . Tobacco use disorder 03/21/2019  . Confusion 03/11/2019  . Bipolar disorder (HCC) 11/21/2018  . Cannabis use disorder, mild, abuse   . B12 deficiency 10/11/2018  . Vitamin D deficiency 10/11/2018  . Columnar epithelial-lined lower esophagus   . 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 Rectal polyp   . Severe anxiety 03/19/2018  . Chronic post-traumatic stress disorder (PTSD)   . OCD (obsessive compulsive disorder) 12/31/2017  . ADHD 12/31/2017  . Prediabetes 12/31/2017    [name]. Ilsa Iha, PT, DPT 09/08/19, 12:15 PM  [facility] PHYSICAL AND SPORTS MEDICINE [address] [place], [address] Phone: [phone]   Fax:  [phone]  Name: Courtney [name] MRN: [id number] Date of Birth: April 14, 1991

## 2019-09-09 ENCOUNTER — Ambulatory Visit (INDEPENDENT_AMBULATORY_CARE_PROVIDER_SITE_OTHER): Payer: Medicaid Other | Admitting: Licensed Clinical Social Worker

## 2019-09-09 DIAGNOSIS — F4312 Post-traumatic stress disorder, chronic: Secondary | ICD-10-CM

## 2019-09-09 DIAGNOSIS — F603 Borderline personality disorder: Secondary | ICD-10-CM

## 2019-09-09 DIAGNOSIS — F3161 Bipolar disorder, current episode mixed, mild: Secondary | ICD-10-CM | POA: Diagnosis not present

## 2019-09-09 DIAGNOSIS — F909 Attention-deficit hyperactivity disorder, unspecified type: Secondary | ICD-10-CM | POA: Diagnosis not present

## 2019-09-09 NOTE — Progress Notes (Signed)
Virtual Visit via Video Note   I connected with Courtney [name] on 09/09/19 at 11:00am by [facility] video application and verified that I am speaking with the correct person using two identifiers.   I discussed the limitations, risks, security and privacy concerns of performing an evaluation and management service by telephone and the availability of in person appointments. I also discussed with the patient that there may be a patient responsible charge related to this service. The patient expressed understanding and agreed to proceed.   I discussed the assessment and treatment plan with the patient. The patient was provided an opportunity to ask questions and all were answered. The patient agreed with the plan and demonstrated an understanding of the instructions.   The patient was advised to call back or seek an in-person evaluation if the symptoms worsen or if the condition fails to improve as anticipated.   I provided 1 hour of non-face-to-face time during this encounter.     [name], LCSW, LCASA ________________________ THERAPIST PROGRESS NOTE   Session Time: 11:00am - 12:00pm    Participation Level: Active   Behavioral Response: Alert, well groomed, casually dressed, combined anxious/depressed mood   Type of Therapy:  Individual Therapy   Treatment Goals addressed: Self-care routine; anxiety management   Interventions: CBT   Summary: Courtney [name] presented for virtual session today via Webex and was alert, oriented x5, with no evidence or self-report of SI/HI or A/V H.  She reported scores of 6/10 for both depression and anxiety and stated "I've been having a lot of frequent flashbacks when I feel stuck".  Courtney [name] reported that she is also having panic attacks roughly 1-2 times per day, and it can feel like she might pass out at times.  Courtney [name] reported that she has made progress in using grounding techniques like 5-4-3-2-1 to manage these collective symptoms, along with  doing yoga regularly, and taking her prescribed medication.  Courtney [name] reported that she is more interested in connecting with a therapist that can provide trauma focused treatment for her PTSD, but does not want to lose current therapist.  She reported that she would outreach [facility] [employer] today to see what her options are.  Courtney [name] reported that she would consider exploring virtual support groups for trauma in the meantime.  Courtney [name] expressed interest in exploring additional techniques today to assist with her panic attacks and improve both memory and focus day to day.  Courtney [name] reported that she would try to practice these several times per day to reduce frequency and severity of symptoms.  Courtney [name] also reported that she would continue trying to journal regularly to monitor mood fluctuations.  She reported that she would follow up virtually in 1 week.       Suicidal/Homicidal: None, without intent or plan   Therapist Response:  Clinician met with Courtney [name] for virtual session today.  Clinician assessed for safety.  Clinician inquired about her emotional ratings today, as well as any significant changes in thoughts, feelings, or behavior since previous session.  Clinician inquired about Courtney [name]'s current progress towards goals, as well as any issues she needs assistance with.  Clinician was supportive of Courtney [name]'s interest in trauma focused treatment and inquired about whether she has looked into available therapists covered by her plan.  Clinician also suggested trauma support groups as a way to connect with others struggling with similar issues so that she could feel less alone and gain additional support throughout the week virtually.  Clinician presented more grounding techniques for Courtney [name]  to practice today based upon three categories: mental, physical, and self-soothing.  Clinician went through each category and provided examples of each for Courtney [name] to include in her daily  practice, such as 'playing category games', 'imagining a calming image', 'carrying a grounding object around', 'stretching', 'saying kind statements to oneself' and 'reciting coping statements'.  Clinician encouraged her to try these out daily and journal about effectiveness to identify most beneficial techniques.  Clinician will continue to monitor.     Plan: Meet again in 1 week virtually.   Diagnosis: Bipolar I Disorder, mixed, mild; Borderline personality disorder; PTSD; ADHD, unspecified    [name], [place], LCASA 09/09/19

## 2019-09-12 ENCOUNTER — Other Ambulatory Visit: Payer: Self-pay

## 2019-09-12 ENCOUNTER — Encounter: Payer: Medicaid Other | Admitting: Physical Therapy

## 2019-09-12 ENCOUNTER — Encounter: Payer: Self-pay | Admitting: Gastroenterology

## 2019-09-12 ENCOUNTER — Encounter: Payer: Self-pay | Admitting: Adult Health

## 2019-09-12 ENCOUNTER — Ambulatory Visit: Payer: Medicaid Other | Admitting: Adult Health

## 2019-09-12 VITALS — BP 127/73 | HR 100 | Ht 59.0 in | Wt 171.6 lb

## 2019-09-12 DIAGNOSIS — R6882 Decreased libido: Secondary | ICD-10-CM

## 2019-09-12 DIAGNOSIS — N898 Other specified noninflammatory disorders of vagina: Secondary | ICD-10-CM

## 2019-09-12 DIAGNOSIS — K219 Gastro-esophageal reflux disease without esophagitis: Secondary | ICD-10-CM | POA: Insufficient documentation

## 2019-09-12 DIAGNOSIS — N906 Unspecified hypertrophy of vulva: Secondary | ICD-10-CM

## 2019-09-12 DIAGNOSIS — N946 Dysmenorrhea, unspecified: Secondary | ICD-10-CM

## 2019-09-12 DIAGNOSIS — Z3202 Encounter for pregnancy test, result negative: Secondary | ICD-10-CM | POA: Diagnosis not present

## 2019-09-12 LAB — POCT URINE PREGNANCY: Preg Test, Ur: NEGATIVE

## 2019-09-12 MED ORDER — NAPROXEN 500 MG PO TABS: ORAL_TABLET | ORAL | 1 refills | Status: DC

## 2019-09-12 NOTE — Progress Notes (Signed)
  Subjective:     Patient ID: Courtney [name], female   DOB: March 14, 1991, 29 y.o.   MRN: [id number]  HPI Courtney [name] is a 29 year old white female, single, G0P0, back in follow up, she has not seen Dr Emelda Fear yet.  Review of Systems Still has decreased sex drive(she says she noticed after taking those 3 Plan B) and vaginal dryness Has noticed pelvic pain is probably menstrual cramps  Reviewed past medical,surgical, social and family history. Reviewed medications and allergies.     Objective:   Physical Exam BP 127/73 (BP Location: Left Arm, Patient Position: Sitting, Cuff Size: Normal)   Pulse 100   Ht 4\' 11"  (1.499 m)   Wt 171 lb 9.6 oz (77.8 kg)   LMP 09/04/2019 (Exact Date)   BMI 34.66 kg/m  UPT is negative  Skin warm and dry.  Lungs: clear to ausculation bilaterally. Cardiovascular: regular rate and rhythm.    Assessment:     1. Urine pregnancy test negative   2. Menstrual cramps Will rx naprosyn Meds ordered this encounter  Medications  . naproxen (NAPROSYN) 500 MG tablet    Sig: Take 1 every 8 hours prn cramps    Dispense:  60 tablet    Refill:  1    Order Specific Question:   Supervising Provider    Answer:   09/06/2019, LUTHER H [[id number]]    3. Vaginal dryness Continue Luvena and ad astroglide Will check estradiol level   4. Hypertrophy of labia minora See Dr Despina Hidden in about 3 weeks   5. Decreased libido Will check estradiol level  Plan:     To return to see Dr Emelda Fear 10/05/19 about labia plasty

## 2019-09-13 ENCOUNTER — Telehealth (HOSPITAL_COMMUNITY): Payer: Self-pay | Admitting: *Deleted

## 2019-09-13 LAB — ESTRADIOL: Estradiol: 66.9 pg/mL

## 2019-09-13 MED ORDER — OMEPRAZOLE 20 MG PO CPDR: 20.0000 mg | DELAYED_RELEASE_CAPSULE | Freq: Every day | ORAL | 0 refills | Status: DC

## 2019-09-13 NOTE — Telephone Encounter (Signed)
Writer received t/c from pt informing us that she has stopped taking her Concerta and just wanted Dr. Demetrius Charity. To be aware.

## 2019-09-13 NOTE — Telephone Encounter (Signed)
Now I am aware.

## 2019-09-14 ENCOUNTER — Ambulatory Visit: Payer: Medicaid Other | Admitting: Family Medicine

## 2019-09-14 ENCOUNTER — Encounter: Payer: Self-pay | Admitting: Family Medicine

## 2019-09-14 ENCOUNTER — Other Ambulatory Visit: Payer: Self-pay

## 2019-09-14 VITALS — BP 120/60 | HR 81 | Temp 97.5°F | Resp 14 | Ht 59.0 in | Wt 168.9 lb

## 2019-09-14 DIAGNOSIS — M25561 Pain in right knee: Secondary | ICD-10-CM | POA: Diagnosis not present

## 2019-09-14 DIAGNOSIS — R410 Disorientation, unspecified: Secondary | ICD-10-CM | POA: Insufficient documentation

## 2019-09-14 DIAGNOSIS — M797 Fibromyalgia: Secondary | ICD-10-CM

## 2019-09-14 DIAGNOSIS — G8929 Other chronic pain: Secondary | ICD-10-CM | POA: Diagnosis not present

## 2019-09-14 NOTE — Progress Notes (Signed)
Name: Courtney [name]   MRN: [id number]    DOB: October 24, 1990   Date:09/14/2019       Progress Note  Subjective  Chief Complaint  Chief Complaint  Patient presents with  . Fibromyalgia  . Knee Pain    HPI  Bilateral knee pain: she came in Dec 2020 complaining of  sharp pain, intermittent on lateral aspect of right knee but got worse . She has noticed pain on anterior knee. No redness or increase in warmth. She is very concerned because she hear a pop when she was exercising at a gym in [place] many years ago, had to go to [facility] but was given ibuprofen, pain improved. She has some menthol topically and seems to help a little . She would like to see ortho  FMS: she has a long history of muscle aches and burning sensation all over her body, also has joint pains, she is seeing neurologist of paresthesias and MS has been ruled out. She hurts all over. She uses topical menthol. Pain right now is 4/10. She states it affects her ability to work out. She also has episodes of mental fogginess. Feels tired after activity .    Patient Active Problem List   Diagnosis Date Noted  . Menstrual cramps 09/12/2019  . Urine pregnancy test negative 09/12/2019  . GERD (gastroesophageal reflux disease) 09/12/2019  . 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 Fatty liver   . General counseling and advice for contraceptive management 07/19/2019  . Decreased libido 07/19/2019  . Vaginal dryness 07/19/2019  . Hypertrophy of labia minora 07/19/2019  . Abdominal pain 07/19/2019  . LLQ pain 07/19/2019  . Depression 07/19/2019  . Vaginal discharge 07/19/2019  . Memory loss or impairment 05/04/2019  . Numbness and tingling of both feet 04/28/2019  . Bilateral hand numbness 04/28/2019  . Borderline personality disorder (HCC) 04/08/2019  . Bipolar 1 disorder, mixed, mild (HCC) 03/21/2019  . Tobacco use disorder 03/21/2019  . Confusion 03/11/2019  . Bipolar disorder (HCC) 11/21/2018  . Cannabis use disorder, mild, abuse   .  B12 deficiency 10/11/2018  . Vitamin D deficiency 10/11/2018  . 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 Rectal polyp   . Severe anxiety 03/19/2018  . Chronic post-traumatic stress disorder (PTSD)   . OCD (obsessive compulsive disorder) 12/31/2017  . ADHD 12/31/2017  . Prediabetes 12/31/2017    Past Surgical History:  Procedure Laterality Date  . APPENDECTOMY  02/17/2014  . COLONOSCOPY WITH PROPOFOL N/A 09/03/2018   Procedure: COLONOSCOPY WITH PROPOFOL;  Surgeon: Pasty Spillers, MD;  Location: [facility] ENDOSCOPY;  Service: Endoscopy;  Laterality: N/A;  . ESOPHAGOGASTRODUODENOSCOPY (EGD) WITH PROPOFOL N/A 09/03/2018   Procedure: ESOPHAGOGASTRODUODENOSCOPY (EGD) WITH PROPOFOL;  Surgeon: Pasty Spillers, MD;  Location: [facility] ENDOSCOPY;  Service: Endoscopy;  Laterality: N/A;  . EUS N/A 10/14/2018   Procedure: FULL UPPER ENDOSCOPIC ULTRASOUND (EUS) RADIAL;  Surgeon: Rayann Heman, MD;  Location: [facility] ENDOSCOPY;  Service: Endoscopy;  Laterality: N/A;  . pyloric stenosis repair      Family History  Problem Relation Age of Onset  . COPD Mother   . Hypertension Mother   . Asthma Mother   . Arthritis Mother   . Pancreatic cancer Mother        slow growing  . ADD / ADHD Mother   . Other Mother        Spinal Stenosis - currently in surgery today  . Bipolar disorder Mother   . Anxiety disorder Mother   . Depression Mother   .  Parkinson's disease Mother   . GER disease Mother   . Bipolar disorder Father   . Arthritis Father   . Hypercholesterolemia Father   . Asthma Brother   . Hernia Brother   . ADD / ADHD Brother   . Bipolar disorder Brother   . Hypertension Maternal Grandmother   . Heart disease Maternal Grandmother 72  . Rheumatic fever Maternal Grandmother   . Heart attack Maternal Grandmother        Bypass Surgery  . Pancreatic cancer Maternal Grandfather   . Liver cancer Maternal Grandfather   . Asthma Maternal Grandfather   . Obesity Paternal Grandmother      Current Outpatient Medications:  .  Cariprazine HCl (VRAYLAR) 4.5 MG CAPS, Take 1 capsule (4.5 mg total) by mouth daily., Disp: 90 capsule, Rfl: 0 .  Cholecalciferol (VITAMIN D) 50 MCG (2000 UT) tablet, Take by mouth., Disp: , Rfl:  .  clonazePAM (KLONOPIN) 1 MG tablet, Take 1 tablet (1 mg total) by mouth 3 (three) times daily as needed for anxiety., Disp: 90 tablet, Rfl: 0 .  ELDERBERRY PO, Take by mouth daily., Disp: , Rfl:  .  MELATONIN PO, Take 10 mg by mouth at bedtime. Gummie, Disp: , Rfl:  .  Multiple Vitamin (MULTI-VITAMIN) tablet, Take by mouth., Disp: , Rfl:  .  naproxen (NAPROSYN) 500 MG tablet, Take 1 every 8 hours prn cramps, Disp: 60 tablet, Rfl: 1 .  Omega-3 Fatty Acids (FISH OIL) 1000 MG CAPS, Take by mouth., Disp: , Rfl:  .  omeprazole (PRILOSEC) 20 MG capsule, Take 1 capsule (20 mg total) by mouth daily., Disp: 30 capsule, Rfl: 0 .  OVER THE COUNTER MEDICATION, RSP Conjugated Linoleic Acid (CLA)    (weight loss), Disp: , Rfl:  .  OXcarbazepine (TRILEPTAL) 150 MG tablet, Take 1 tablet (150 mg total) by mouth 2 (two) times daily for 14 days, THEN 2 tablets (300 mg total) 2 (two) times daily for 16 days., Disp: 92 tablet, Rfl: 0 .  polyethylene glycol powder (GLYCOLAX/MIRALAX) 17 GM/SCOOP powder, Take one cap full of Miralax daily, Disp: 11475 g, Rfl: 0 .  pyridOXINE (VITAMIN B-6) 100 MG tablet, Take 1 tablet (100 mg total) by mouth daily. TK 1 T PO ONCE D, Disp: 90 tablet, Rfl: 0 .  Omega-3 Fatty Acids (OMEGA-3 PO), Take 2,400 mg by mouth. , Disp: , Rfl:   Allergies  Allergen Reactions  . Vyvanse [Lisdexamfetamine] Other (See Comments)    Bounce off of the wall    I personally reviewed active problem list, medication list, allergies, family history, social history with the patient/caregiver today.   ROS  Constitutional: Negative for fever, positive for mild  weight change.  Respiratory: Negative for cough and shortness of breath.   Cardiovascular: Negative for chest  pain or palpitations.  Gastrointestinal: Negative for abdominal pain, no bowel changes.  Musculoskeletal: Negative for gait problem or joint swelling.  Skin: Negative for rash.  Neurological: Negative for dizziness or headache.  No other specific complaints in a complete review of systems (except as listed in HPI above).  Objective  Vitals:   09/14/19 1137  BP: 120/60  Pulse: 81  Resp: 14  Temp: (!) 97.5 F (36.4 C)  SpO2: 97%  Weight: 168 lb 14.4 oz (76.6 kg)  Height: 4\' 11"  (1.499 m)    Body mass index is 34.11 kg/m.  Physical Exam  Constitutional: Patient appears well-developed and well-nourished. Obese No distress.  HEENT: head atraumatic, normocephalic, pupils equal and  reactive to light Cardiovascular: Normal rate, regular rhythm and normal heart sounds.  No murmur heard. No BLE edema. Pulmonary/Chest: Effort normal and breath sounds normal. No respiratory distress. Abdominal: Soft.  There is no tenderness. Muscular Skeletal: trigger point positive, knee no effusion, normal rom , anterior knee point Psychiatric: Patient has a normal mood and affect. behavior is normal. Judgment and thought content normal.  Recent Results (from the past 2160 hour(s))  HIV Antibody (routine testing w rflx)     Status: None   Collection Time: 07/18/19 12:00 AM  Result Value Ref Range   HIV 1&2 Ab, 4th Generation NON-REACTIVE NON-REACTI    Comment: HIV-1 antigen and HIV-1/HIV-2 antibodies were not detected. There is no laboratory evidence of HIV infection. Marland Kitchen PLEASE NOTE: This information has been disclosed to you from records whose confidentiality may be protected by state law.  If your state requires such protection, then the state law prohibits you from making any further disclosure of the information without the specific written consent of the person to whom it pertains, or as otherwise permitted by law. A general authorization for the release of medical or other information is  NOT sufficient for this purpose. . For additional information please refer to http://education.[facility].com/faq/FAQ106 (This link is being provided for informational/ educational purposes only.) . Marland Kitchen The performance of this assay has not been clinically validated in patients less than 8 years old. .   RPR     Status: None   Collection Time: 07/18/19 12:00 AM  Result Value Ref Range   RPR Ser Ql NON-REACTIVE NON-REACTI  Cervicovaginal ancillary only     Status: None   Collection Time: 07/18/19  3:03 PM  Result Value Ref Range   Neisseria Gonorrhea Negative    Chlamydia Negative    Comment Normal Reference Ranger Chlamydia - Negative    Comment      Normal Reference Range Neisseria Gonorrhea - Negative  POCT Urinalysis Dipstick     Status: None   Collection Time: 07/19/19 10:26 AM  Result Value Ref Range   Color, UA     Clarity, UA     Glucose, UA Negative Negative   Bilirubin, UA     Ketones, UA neg    Spec Grav, UA     Blood, UA neg    pH, UA     Protein, UA Negative Negative   Urobilinogen, UA     Nitrite, UA neg    Leukocytes, UA Negative Negative   Appearance     Odor    POCT Wet Prep [place] [place])     Status: None   Collection Time: 07/19/19 11:18 AM  Result Value Ref Range   Source Wet Prep POC vaginal    WBC, Wet Prep HPF POC neg    Bacteria Wet Prep HPF POC     BACTERIA WET PREP MORPHOLOGY POC     Clue Cells Wet Prep HPF POC None None   Clue Cells Wet Prep Whiff POC Negative Whiff    Yeast Wet Prep HPF POC None None   KOH Wet Prep POC     Trichomonas Wet Prep HPF POC    POCT urine pregnancy     Status: None   Collection Time: 09/12/19  9:27 AM  Result Value Ref Range   Preg Test, Ur Negative Negative  Estradiol     Status: None   Collection Time: 09/12/19 10:19 AM  Result Value Ref Range   Estradiol 66.9 pg/mL    Comment:  Adult Female:                       Follicular phase   78.2 -   166.0                       Ovulation  phase    85.8 -   498.0                       Luteal phase       43.8 -   211.0                       Postmenopausal     <6.0 -    54.7                     Pregnancy                       1st trimester     215.0 - >4300.0 Roche ECLIA methodology     PHQ2/9: Depression screen [facility] 2/9 09/14/2019 07/19/2019 07/19/2019 07/18/2019 06/06/2019  Decreased Interest 1 1 1 1 3   Down, Depressed, Hopeless 3 1 1 1 3   PHQ - 2 Score 4 2 2 2 6   Altered sleeping 0 1 - 1 2  Tired, decreased energy 3 1 - 1 3  Change in appetite 0 0 - 0 0  Feeling bad or failure about yourself  3 3 - 3 3  Trouble concentrating 3 3 - 3 3  Moving slowly or fidgety/restless 3 1 - 2 3  Suicidal thoughts 0 1 - 1 3  PHQ-9 Score 16 12 - 13 23  Difficult doing work/chores Extremely dIfficult - - Very difficult Very difficult  Some recent data might be hidden    phq 9 is positive sees psychiatrist    Fall Risk: Fall Risk  09/14/2019 07/19/2019 07/18/2019 03/04/2019 02/17/2019  Falls in the past year? 0 1 0 0 0  Number falls in past yr: 0 0 0 0 0  Injury with Fall? 0 0 0 0 0  Follow up - - - - -     Functional Status Survey: Is the patient deaf or have difficulty hearing?: No Does the patient have difficulty seeing, even when wearing glasses/contacts?: No Does the patient have difficulty concentrating, remembering, or making decisions?: Yes Does the patient have difficulty walking or climbing stairs?: No Does the patient have difficulty dressing or bathing?: No Does the patient have difficulty doing errands alone such as visiting a doctor's office or shopping?: No    Assessment & Plan  1. Chronic pain of right knee  - Ambulatory referral to Orthopedic Surgery  2. Fibromyalgia syndrome  She will ask her psychiatrist if she can take duloxetine and or lyrica

## 2019-09-14 NOTE — Telephone Encounter (Signed)
Had relayed message to patient

## 2019-09-14 NOTE — Patient Instructions (Signed)
Medications that may help with FMS and pain are Duloxetine and Lyrica/Gabapentin, please ask psychiatrist if it is okay for [name] to add it to your regiment

## 2019-09-15 ENCOUNTER — Encounter: Payer: Self-pay | Admitting: Physical Therapy

## 2019-09-15 ENCOUNTER — Ambulatory Visit: Payer: Medicaid Other | Attending: Neurology | Admitting: Physical Therapy

## 2019-09-15 DIAGNOSIS — M79602 Pain in left arm: Secondary | ICD-10-CM | POA: Diagnosis present

## 2019-09-15 DIAGNOSIS — M545 Low back pain, unspecified: Secondary | ICD-10-CM

## 2019-09-15 DIAGNOSIS — G8929 Other chronic pain: Secondary | ICD-10-CM | POA: Diagnosis present

## 2019-09-15 DIAGNOSIS — M79601 Pain in right arm: Secondary | ICD-10-CM

## 2019-09-15 DIAGNOSIS — M79605 Pain in left leg: Secondary | ICD-10-CM | POA: Diagnosis present

## 2019-09-15 DIAGNOSIS — R202 Paresthesia of skin: Secondary | ICD-10-CM | POA: Insufficient documentation

## 2019-09-15 DIAGNOSIS — M542 Cervicalgia: Secondary | ICD-10-CM | POA: Diagnosis present

## 2019-09-15 DIAGNOSIS — M79604 Pain in right leg: Secondary | ICD-10-CM | POA: Insufficient documentation

## 2019-09-15 NOTE — Therapy (Signed)
[facility] [address] [place], [address] Phone: [phone]   Fax:  [phone]  Physical Therapy Treatment / Progress Note Reporting period: 09/01/2019 - 09/15/2019  Patient Details  Name: Courtney [name] MRN: [id number] Date of Birth: [birth year] Referring Provider (PT): Gurney Maxin, MD   Encounter Date: 09/15/2019  PT End of Session - 09/15/19 1049    Visit Number  3    Number of Visits  16    Date for PT Re-Evaluation  11/10/19    Authorization Type  Medicaid reporting period from 09/01/2019    Authorization - Visit Number  3    Authorization - Number of Visits  3    PT Start Time  2878    PT Stop Time  1115    PT Time Calculation (min)  40 min    Activity Tolerance  Patient tolerated treatment well    Behavior During Therapy  [facility] for tasks assessed/performed       Past Medical History:  Diagnosis Date  . ADHD    As a child  . Anxiety   . Bipolar 1 disorder ([place])   . Depression   . Fatty liver   . OCD (obsessive compulsive disorder)   . Personality disorder ([place])   . PTSD (post-traumatic stress disorder)   . Pyloric stenosis     Past Surgical History:  Procedure Laterality Date  . APPENDECTOMY  02/17/2014  . COLONOSCOPY WITH PROPOFOL N/A 09/03/2018   Procedure: COLONOSCOPY WITH PROPOFOL;  Surgeon: Virgel Manifold, MD;  Location: [facility] ENDOSCOPY;  Service: Endoscopy;  Laterality: N/A;  . ESOPHAGOGASTRODUODENOSCOPY (EGD) WITH PROPOFOL N/A 09/03/2018   Procedure: ESOPHAGOGASTRODUODENOSCOPY (EGD) WITH PROPOFOL;  Surgeon: Virgel Manifold, MD;  Location: [facility] ENDOSCOPY;  Service: Endoscopy;  Laterality: N/A;  . EUS N/A 10/14/2018   Procedure: FULL UPPER ENDOSCOPIC ULTRASOUND (EUS) RADIAL;  Surgeon: Jola Schmidt, MD;  Location: [facility] ENDOSCOPY;  Service: Endoscopy;  Laterality: N/A;  . pyloric stenosis repair      There were no vitals filed for this visit.  Subjective Assessment -  09/15/19 1037    Subjective  Patient reports she saw her PCP yesterday and she said the spot on her back was not a lipoma but did not use gel and did not seem concerned about it. Patient felt she was dismissive. Patient has another lump higher on her back that she is concerned about. She just found out that her pain was fibromyalgia. Rates pain as 6/10 upon arrival. neck feeling really stiff. Has been donig HEP and have been doing alright but she is not sure if she is doing them correctly. States she does not remember a lot of sorenes following last treatment session. Patient report she feels she is benefiting from physical therapy and is moving more. She is successfully completing her initial HEP. Reports her highest pain since starting physical therapy is 7/10. Reports decreased urge to bite/pinch wrists to cope with pain (one time since starting PT).    Pertinent History  Patient is a 29 y.o. female who presents to outpatient physical therapy with a referral for medical diagnosis numbness and tingling. This patient's chief complaints consist of burning pain from the base of her neck down both arms, over her entire spine and down both legs with intermittent tingling in her hands and feet she also complains of joint pain especially at the R knee. This leading to the following functional deficits: difficulty working out, decreased quality of  life, difficulty tolerating usual tasks due to distraction from pain, describes it causing feeling need to pinch or bite her wrists because she cannot stand the pain. Relevant past medical history and comorbidities include fibromyalgia, bipolar 1 disorder, memory loss/confusion, borderline personality disorder, OCD, PTSD, fatty liver, pyloric stenosis, seizures as a child, ADHD, severe anxiety, pre-diabetes, depression, history of concussion within the last 6 months, former smoker. Currently sees mental health counselor as well as psychiatrist. States she feels she has  adequate mental health support at this time. Denies stroke, lung problems, cardiac problems (except occasional chest pain she associates with anxiety), cancer. MS was already ruled out.    Limitations  Other (comment);House hold activities   difficulty working out, decreased quality of life, difficulty tolerating usual tasks due to distraction from pain, describes it causing feeling need to pinch or bite her wrists because she cannot stand the pain. makes everything harder   Diagnostic tests  Brain MRI report 10/15/2019: normal, no evidence of demyelinating disease. Lumbar spine radiograph report 07/26/19: "no acue abnormality in lumbar spine" shows L5 transitional verebral body, disc heights maintained. Radiograph report of cervical spine 07/26/2019: "kpyosis in the cervical spien with areas of mild disc space narrowing at c5-6. There appears to be some posterior disc space narrowing at C7-T1.    Patient Stated Goals  to feel better and alleviate the pain and learn some exercises to do at home.    Currently in Pain?  Yes    Pain Score  6     Pain Location  Neck    Pain Onset  More than a month ago   29 years old        TREATMENT:  Therapeutic exercise:to centralize symptoms and improve ROM, strength, muscular endurance, and activity tolerance required for successful completion of functional activities.  - Sidelying open book (thoracic rotation) to improve thoracic, shoulder girdle, and upper trunk mobility. Required instruction for technique and cuing to achieve end range as tolerated, hold time, and breathing technique. x10 reps each side with cuing for breathing and form.  - clam shell with red theraband x 10 each side. Reassurance pt is using correct form.  - hooklying bridge with red band around distal thighs x 10 with good form.  - hooklying dead bug with abdominal brace.  x 10 each side. Cuing to learn activity and maintain trunk stability. Attempted full dead bug, x5 each side but  unable to adequately control trunk, resulting in increased low back discomfort.  - hooklying sequential leg lifts with knees flexed (R up, L up, R down, L down), focusing on stabilizing core in neutral. Patient unable to fully control with both feet off mat. Improved with self feedback with hand under back and practice, but uncomfortable at the shoulders. Repeated with hands at posterior iliac crest where movement when performed incorrectly also can be felt. Updated HEP with this exercise to help patient work directly on coordination and strength of core while moving legs into and out of challenging position. Did not increase low back discomfort further. Required extended time to practice and stop every few reps to discuss. Completed approximately 15 reps total.  - Standing rows with scapular retraction for improved postural and shoulder girdle strengthening and mobility. Required instruction for technique and cuing to retract, posteriorly tilt, and depress scapulae. x10 with red theraband, x 10 with green theraband. Cuing to relax throat.  - wall angels x 10, cuing for proper technique.  - Education on HEP including handout  -  palpation of low back per pt request due to her concerns about tender lumps there. Upon palpation, were within normal limits for tissue in this region. Reassured patient they do not appear to be abnormal and that the sensitivity there is likely related to fibromyalgia.  - applied biofreeze at pt request over low back, both shoulders and base of cervical spine for improved patient comfort. Good tolerance.   Patient requires extended time for discussion of form, exercises, condition and concerns about exercise form and soft tissue lumps. Pt required multimodal cuing for proper technique and to facilitate improved neuromuscular control, strength, range of motion, and functional ability resulting in improved performance and form.  HOME EXERCISE PROGRAM Access Code: [email or web address]  URL:  [email or web address]  Date: 09/15/2019  Prepared by: Rosita Kea   Exercises Sidelying Thoracic Rotation with Open Book - 10 reps - one breath hold - 1x daily Clam with Resistance - 3 sets - 10 reps - 1x daily Supine Bridge with Resistance Band - 3 sets - 10 reps - 1x daily Supine March with Alternating Leg Lifts - 3 sets - 10 reps - 1x daily Row with band/cable - 3 sets - 10 reps - 1x daily Wall Angels - 3 sets - 10 reps - 1x daily     PT Education - 09/15/19 1136    Education Details  Exercise purpose/form. Self management techniques. Education on diagnosis, prognosis, POC, anatomy and physiology of current condition.    Person(s) Educated  Patient    Methods  Explanation;Demonstration;Tactile cues;Verbal cues;Handout    Comprehension  Verbalized understanding;Returned demonstration;Verbal cues required;Tactile cues required;Other (comment);Need further instruction       PT Short Term Goals - 09/15/19 1133      PT SHORT TERM GOAL #1   Title  Be independent with initial home exercise program for self-management of symptoms.    Baseline  Initial HEP to be provided 2nd visit (09/01/2019); provided and participating consistently (09/15/2019);    Time  3    Period  Weeks    Status  Achieved    Target Date  09/22/19        PT Long Term Goals - 09/15/19 1052      PT LONG TERM GOAL #1   Title  Be independent with a long-term home exercise program for self-management of symptoms.    Baseline  Initial HEP to be provided at 2nd visit (09/01/2019); successfully participating in HEP appropriate for stage of recovery, has not yet received final long term HEP (09/15/2019);    Time  10    Period  Weeks    Status  New    Target Date  11/10/19      PT LONG TERM GOAL #2   Title  Patient will demonstrate improved ability to perform functional tasks as exhibited by at least 10 point improvement in FOTO score    Baseline  deferred to 2nd visit (09/01/2019); FOTO  = 47 (09/08/2019);     Time  10    Period  Weeks    Status  On-going      PT LONG TERM GOAL #3   Title  Have full cervical spine and lumbar AROM with no compensations or increase in pain in all planes except intermittent end range discomfort to allow patient to complete valued activities with less difficulty.    Baseline  see objective exam - painful and limited (09/01/2019); measurement deferred due to proximity to initial eval (09/15/2019);    Time  10  Period  Weeks    Status  On-going    Target Date  11/10/19      PT LONG TERM GOAL #4   Title  Complete community, work and/or recreational activities without limitation due to current condition.    Baseline  difficulty working out, decreased quality of life, difficulty tolerating usual tasks due to distraction from pain, describes it causing feeling need to pinch or bite her wrists because she cannot stand the pain. (09/01/2019); continues to report similar limitations, but also decreased urge to bite/pinch wrists to cope with pain (one time since starting PT) (09/15/2019);    Time  10    Period  Weeks    Status  Partially Met    Target Date  11/10/19      PT LONG TERM GOAL #5   Title  Reduce pain with functional activities to equal or less than 2/10 to allow patient to complete usual activities including ADLs, IADLs, and social engagement with less difficulty.    Baseline  8/10 (09/01/2019); 7/10 (09/15/2019);    Time  10    Period  Weeks    Status  Partially Met    Target Date  11/10/19         Plan - 09/15/19 1150    Clinical Impression Statement  Patient has attended 3 physical therapy sessions this episode of care and is making appropriate progress towards goals at this point. She has established a HEP and is participating faithfully. Subjectively, she reports decreased max level of pain and need for unhealthy coping skills. Objectively, she demonstrates improved activity tolerance and ability to perform exercises properly in clinic. She also appears  less anxious about her condition following frequent education. FOTO baseline has been obtained but not re-measured due to close proximity to initial eval. Patient is very inquisitive and detail oriented and requires significant time for education throughout her sessions. Patient was unable to attend one authorized session due to limitations in transportation. She uses a transportation service that brings her from another county and she was unable to schedule and attend all appointments within the authorization period due to requirements to schedule transportation 5 business days in advance. She is working with scheduling to correct this problem for the future. Patient continues to have symptoms consistent with widespread chronic pain condition affecting entire body including head, neck, back, BUE and BLE. Patient continues to present with significant paresthesia, pain, ROM, activity tolerance impairments that are limiting ability to complete usual activities including working out, ADLs, IADLs, hobbies (include makeup, cutting hair, cleaning, cooking, writing, cooking) without difficulty and decreases her quality of life significantly. Patient will benefit from continued skilled physical therapy intervention as outlined in original POC to address current body structure impairments and activity limitations to improve function and work towards goals set in current POC in order to return to prior level of function or maximal functional improvement    Personal Factors and Comorbidities  Behavior Pattern;Comorbidity 3+;Education;Social Background;Fitness;Time since onset of injury/illness/exacerbation;Age    Comorbidities  Relevant past medical history and comorbidities include fibromyalgia, bipolar 1 disorder, memory loss/confusion, borderline personality disorder, OCD, PTSD, fatty liver, pyloric stenosis, seizures as a child, ADHD, severe anxiety, pre-diabetes, depression, history of concussion within the last 6  months, former smoker.    Examination-Activity Limitations  Other   functional deficits: difficulty working out, decreased quality of life, difficulty tolerating usual tasks due to distraction from pain, describes it causing feeling need to pinch or bite her wrists because she  cannot stand the pain.   Examination-Participation Restrictions  Community Activity;Interpersonal Relationship;Other   affects all activities when pain is too high to participate comfortably   Stability/Clinical Decision Making  Evolving/Moderate complexity    Rehab Potential  Fair    PT Frequency  2x / week    PT Duration  Other (comment)   10 weeks   PT Treatment/Interventions  ADLs/Self Care Home Management;Aquatic Therapy;Biofeedback;Cryotherapy;Moist Heat;Traction;Electrical Stimulation;Therapeutic activities;Therapeutic exercise;Balance training;Neuromuscular re-education;Patient/family education;Cognitive remediation;Manual techniques;Passive range of motion;Dry needling;Spinal Manipulations;Joint Manipulations    PT Next Visit Plan  progress core, postural, LE and UE strengthening and mobility as tolerated    PT Home Exercise Plan  Medbridge Access Code: [email or web address]    Consulted and Agree with Plan of Care  Patient       Patient will benefit from skilled therapeutic intervention in order to improve the following deficits and impairments:  Impaired sensation, Improper body mechanics, Pain, Postural dysfunction, Increased muscle spasms, Decreased activity tolerance, Decreased endurance, Decreased range of motion, Decreased strength, Impaired perceived functional ability, Impaired UE functional use, Obesity, Impaired flexibility, Decreased balance, Difficulty walking  Visit Diagnosis: Cervicalgia  Chronic bilateral low back pain, unspecified whether sciatica present  Pain in both upper extremities  Pain in both lower extremities  Paresthesia of skin     Problem List Patient Active Problem List   Diagnosis  Date Noted  . Episodic confusion 09/14/2019  . Fibromyalgia syndrome 09/14/2019  . Menstrual cramps 09/12/2019  . GERD (gastroesophageal reflux disease) 09/12/2019  . 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 Fatty liver   . Decreased libido 07/19/2019  . Vaginal dryness 07/19/2019  . Hypertrophy of labia minora 07/19/2019  . Memory loss or impairment 05/04/2019  . Numbness and tingling of both feet 04/28/2019  . Bilateral hand numbness 04/28/2019  . Borderline personality disorder ([place]) 04/08/2019  . Bipolar 1 disorder, mixed, mild ([place]) 03/21/2019  . Tobacco use disorder 03/21/2019  . Bipolar disorder ([place]) 11/21/2018  . Cannabis use disorder, mild, abuse   . B12 deficiency 10/11/2018  . Vitamin D deficiency 10/11/2018  . 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 Rectal polyp   . Severe anxiety 03/19/2018  . Chronic post-traumatic stress disorder (PTSD)   . OCD (obsessive compulsive disorder) 12/31/2017  . ADHD 12/31/2017  . Prediabetes 12/31/2017    Everlean Alstrom. Graylon Good, PT, DPT 09/15/19, 11:54 AM  [facility] [address], [place], [address] Phone: [phone]   Fax:  [phone]  Name: Courtney [name] MRN: [id number] Date of Birth: [birth year]

## 2019-09-16 ENCOUNTER — Other Ambulatory Visit: Payer: Self-pay

## 2019-09-16 ENCOUNTER — Ambulatory Visit (INDEPENDENT_AMBULATORY_CARE_PROVIDER_SITE_OTHER): Payer: Medicaid Other | Admitting: Licensed Clinical Social Worker

## 2019-09-16 DIAGNOSIS — F3161 Bipolar disorder, current episode mixed, mild: Secondary | ICD-10-CM | POA: Diagnosis not present

## 2019-09-16 DIAGNOSIS — F909 Attention-deficit hyperactivity disorder, unspecified type: Secondary | ICD-10-CM | POA: Diagnosis not present

## 2019-09-16 DIAGNOSIS — F603 Borderline personality disorder: Secondary | ICD-10-CM | POA: Diagnosis not present

## 2019-09-16 DIAGNOSIS — F4312 Post-traumatic stress disorder, chronic: Secondary | ICD-10-CM | POA: Diagnosis not present
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
## 2019-09-20 ENCOUNTER — Ambulatory Visit: Payer: Medicaid Other | Admitting: Family Medicine

## 2019-09-20 ENCOUNTER — Ambulatory Visit (INDEPENDENT_AMBULATORY_CARE_PROVIDER_SITE_OTHER): Payer: Medicaid Other | Admitting: Psychiatry

## 2019-09-20 ENCOUNTER — Other Ambulatory Visit: Payer: Self-pay

## 2019-09-20 DIAGNOSIS — F3161 Bipolar disorder, current episode mixed, mild: Secondary | ICD-10-CM

## 2019-09-20 DIAGNOSIS — F4312 Post-traumatic stress disorder, chronic: Secondary | ICD-10-CM

## 2019-09-20 DIAGNOSIS — F603 Borderline personality disorder: Secondary | ICD-10-CM | POA: Diagnosis not present

## 2019-09-20 DIAGNOSIS — F902 Attention-deficit hyperactivity disorder, combined type: Secondary | ICD-10-CM | POA: Diagnosis not present

## 2019-09-20 MED ORDER — DULOXETINE HCL 30 MG PO CPEP: ORAL_CAPSULE | ORAL | 0 refills | Status: DC

## 2019-09-20 MED ORDER — METHYLPHENIDATE HCL ER (OSM) 54 MG PO TBCR: 54.0000 mg | EXTENDED_RELEASE_TABLET | ORAL | 0 refills | Status: DC

## 2019-09-20 MED ORDER — OXCARBAZEPINE 300 MG PO TABS: 300.0000 mg | ORAL_TABLET | Freq: Two times a day (BID) | ORAL | 0 refills | Status: DC

## 2019-09-20 MED ORDER — CLONAZEPAM 1 MG PO TABS: 1.0000 mg | ORAL_TABLET | Freq: Three times a day (TID) | ORAL | 0 refills | Status: DC | PRN
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
## 2019-09-21 ENCOUNTER — Encounter: Payer: Medicaid Other | Admitting: Physical Therapy

## 2019-09-23 ENCOUNTER — Other Ambulatory Visit: Payer: Self-pay

## 2019-09-23 ENCOUNTER — Ambulatory Visit (INDEPENDENT_AMBULATORY_CARE_PROVIDER_SITE_OTHER): Payer: Medicaid Other | Admitting: Licensed Clinical Social Worker

## 2019-09-23 DIAGNOSIS — F909 Attention-deficit hyperactivity disorder, unspecified type: Secondary | ICD-10-CM | POA: Diagnosis not present

## 2019-09-23 DIAGNOSIS — F4312 Post-traumatic stress disorder, chronic: Secondary | ICD-10-CM | POA: Diagnosis not present

## 2019-09-23 DIAGNOSIS — F603 Borderline personality disorder: Secondary | ICD-10-CM

## 2019-09-23 DIAGNOSIS — F3161 Bipolar disorder, current episode mixed, mild: Secondary | ICD-10-CM

## 2019-09-23 NOTE — Progress Notes (Signed)
Virtual Visit via Video Note   I connected with Courtney [name] on 09/23/19 at 11:00am by [facility] video application and verified that I am speaking with the correct person using two identifiers.   I discussed the limitations, risks, security and privacy concerns of performing an evaluation and management service by telephone and the availability of in person appointments. I also discussed with the patient that there may be a patient responsible charge related to this service. The patient expressed understanding and agreed to proceed.   I discussed the assessment and treatment plan with the patient. The patient was provided an opportunity to ask questions and all were answered. The patient agreed with the plan and demonstrated an understanding of the instructions.   The patient was advised to call back or seek an in-person evaluation if the symptoms worsen or if the condition fails to improve as anticipated.   I provided 1 hour of non-face-to-face time during this encounter.     Courtney Flood, LCSW, LCAS ________________________ THERAPIST PROGRESS NOTE   Session Time: 11:00am - 12:00pm    Participation Level: Active   Behavioral Response: Alert, well groomed, casually dressed, anxious mood/affect   Type of Therapy:  Individual Therapy   Treatment Goals addressed: Self-care routine; anxiety management; Medication management; Journaling    Interventions: CBT, psychoeducation   Summary: Courtney [name] presented for Webex session today and was alert, oriented x5, with no evidence or self-report of SI/HI or A/V H. Courtney [name] reported that she is taking medication as prescribed.  Courtney [name] reported scores of 3/10 for both depression and anxiety and stated "I did catch myself almost having a panic attack the other day, but I drank some water, breathed, and changed the subject". Courtney [name] reported additional success in socializing with friends, engaging in past self-care activities such as cutting  hair, doing makeup, and reading over past material presented in session.  Courtney [name] reported that she has noticed that she worries a lot of things outside of her control and would like to better understand this.  Courtney [name] reported that when she received bad news about her brother the other day, her first thoughts were that he could end up in jail again, get hurt, and/or die, and could also act out and get in trouble with the guards or other inmates.  Courtney [name] reported that this led to feelings of anxiety, fear, depression, worry, and made her feel paralyzed due to inability to ensure his safety, which brought upon the state of panic.  Upon learning concept of cognitive distortions, Courtney [name] reported that she is guilty of filtering out the good aspects of life in favor of the negative, jumps to conclusions about what might happen, and expects the worst outcomes to occur due to her past trauma.  Courtney [name] successfully challenged distortions by acknowledging that this pattern of thinking does her more [name] than good, reasoned that things will likely turn out okay since her brother has gone through this before and been fine, now has more healthy coping skills and he has a good Courtney [name].  Courtney [name] reported that she would try to focus more upon things she can control, such as praying for him, and being supportive without overstepping her personal boundaries.  Courtney [name] reported that she has not been journaling often, but would consider making a greater effort following this session.  She reported that she would follow up virtually in 1 week.       Suicidal/Homicidal: None, without intent or plan   Therapist Response:  Clinician met with Courtney [name]  for virtual session today.  Clinician assessed for safety.  Clinician inquired about Courtney [name]'s current emotional ratings, as well as any significant changes in thoughts, feelings, or behavior since previous session.  Clinician inquired about progress Courtney [name] has made  towards goals this past week, as well as any barriers present.  Clinician explained CBT model to Courtney [name] today, including how thoughts, feelings, and behavior interact together, and the impact that negative or unrealistic thoughts can have on actions we take day to day.  Clinician utilized Courtney [name]'s near panic episode the other day to identify how the bad news she received caused this process to play out, and helped her identify where cognitive distortions were present, including filtering, jumping to conclusions, and catastrophizing.  Clinician assisted Courtney [name] in countering her distortions by replacing thoughts with more realistic and/or optimistic ones to improve mood and outlook. Clinician encouraged Courtney [name] to document her progress in thought replacement through regular journaling each week.  Clinician will continue to monitor.      Plan: Meet again in 1 week virtually.   Diagnosis: Bipolar I Disorder, mixed, mild; Borderline personality disorder; PTSD; ADHD, unspecified    Courtney Flood, LCSW, LCAS 09/23/19

## 2019-09-26 ENCOUNTER — Encounter: Payer: Self-pay | Admitting: Physical Therapy

## 2019-09-26 ENCOUNTER — Other Ambulatory Visit: Payer: Self-pay

## 2019-09-26 ENCOUNTER — Encounter: Payer: Self-pay | Admitting: Family Medicine

## 2019-09-26 ENCOUNTER — Ambulatory Visit: Payer: Medicaid Other | Admitting: Physical Therapy

## 2019-09-26 ENCOUNTER — Other Ambulatory Visit: Payer: Self-pay | Admitting: Family Medicine

## 2019-09-26 DIAGNOSIS — M542 Cervicalgia: Secondary | ICD-10-CM

## 2019-09-26 DIAGNOSIS — M545 Low back pain, unspecified: Secondary | ICD-10-CM | POA: Insufficient documentation

## 2019-09-26 DIAGNOSIS — G8929 Other chronic pain: Secondary | ICD-10-CM

## 2019-09-26 DIAGNOSIS — R0602 Shortness of breath: Secondary | ICD-10-CM

## 2019-09-26 DIAGNOSIS — R202 Paresthesia of skin: Secondary | ICD-10-CM

## 2019-09-26 DIAGNOSIS — M79605 Pain in left leg: Secondary | ICD-10-CM

## 2019-09-26 DIAGNOSIS — M79604 Pain in right leg: Secondary | ICD-10-CM

## 2019-09-26 DIAGNOSIS — R079 Chest pain, unspecified: Secondary | ICD-10-CM

## 2019-09-26 DIAGNOSIS — M79601 Pain in right arm: Secondary | ICD-10-CM

## 2019-09-26 NOTE — Therapy (Signed)
[facility] [address] [place], [address] Phone: [phone]   Fax:  [phone]  Physical Therapy Treatment  Patient Details  Name: Courtney [name] MRN: [id number] Date of Birth: 1991-01-21 Referring Provider (PT): Gurney Maxin, MD    Encounter Date: 09/26/2019  PT End of Session - 09/26/19 1353    Visit Number  4    Number of Visits  16    Date for PT Re-Evaluation  11/10/19    Authorization Type  Medicaid reporting period from 09/01/2019    Authorization - Visit Number  1    Authorization - Number of Visits  12    PT Start Time  1346    PT Stop Time  1430    PT Time Calculation (min)  44 min    Activity Tolerance  Patient tolerated treatment well    Behavior During Therapy  [facility] for tasks assessed/performed       Past Medical History:  Diagnosis Date  . ADHD    As a child  . Anxiety   . Bipolar 1 disorder ([place])   . Depression   . Fatty liver   . OCD (obsessive compulsive disorder)   . Personality disorder ([place])   . PTSD (post-traumatic stress disorder)   . Pyloric stenosis     Past Surgical History:  Procedure Laterality Date  . APPENDECTOMY  02/17/2014  . COLONOSCOPY WITH PROPOFOL N/A 09/03/2018   Procedure: COLONOSCOPY WITH PROPOFOL;  Surgeon: Virgel Manifold, MD;  Location: [facility] ENDOSCOPY;  Service: Endoscopy;  Laterality: N/A;  . ESOPHAGOGASTRODUODENOSCOPY (EGD) WITH PROPOFOL N/A 09/03/2018   Procedure: ESOPHAGOGASTRODUODENOSCOPY (EGD) WITH PROPOFOL;  Surgeon: Virgel Manifold, MD;  Location: [facility] ENDOSCOPY;  Service: Endoscopy;  Laterality: N/A;  . EUS N/A 10/14/2018   Procedure: FULL UPPER ENDOSCOPIC ULTRASOUND (EUS) RADIAL;  Surgeon: Jola Schmidt, MD;  Location: [facility] ENDOSCOPY;  Service: Endoscopy;  Laterality: N/A;  . pyloric stenosis repair      There were no vitals filed for this visit.  Subjective Assessment - 09/26/19 1349    Subjective  Patient reports she is  feeling okay physically after she started something for the fibromyalgia so her pain is lower (4/10) but her joints are more painful (currently 4/10 but can go up to 6/10). She felt okay following last treatment session. She had some pops in her low back when doing the leg lifting exercise which made her nervous so she stopped it. She just saw her neurolgist who wants to to an MRI on her back. She also brought some of her own exercise loops because she likes to use them and wanted to make sure they are working correctly.    Pertinent History  Patient is a 29 y.o. female who presents to outpatient physical therapy with a referral for medical diagnosis numbness and tingling. This patient's chief complaints consist of burning pain from the base of her neck down both arms, over her entire spine and down both legs with intermittent tingling in her hands and feet she also complains of joint pain especially at the R knee. This leading to the following functional deficits: difficulty working out, decreased quality of life, difficulty tolerating usual tasks due to distraction from pain, describes it causing feeling need to pinch or bite her wrists because she cannot stand the pain. Relevant past medical history and comorbidities include fibromyalgia, bipolar 1 disorder, memory loss/confusion, borderline personality disorder, OCD, PTSD, fatty liver, pyloric stenosis, seizures as a child,  ADHD, severe anxiety, pre-diabetes, depression, history of concussion within the last 6 months, former smoker. Currently sees mental health counselor as well as psychiatrist. States she feels she has adequate mental health support at this time. Denies stroke, lung problems, cardiac problems (except occasional chest pain she associates with anxiety), cancer. MS was already ruled out.    Limitations  Other (comment);House hold activities   difficulty working out, decreased quality of life, difficulty tolerating usual tasks due to distraction  from pain, describes it causing feeling need to pinch or bite her wrists because she cannot stand the pain. makes everything harder   Diagnostic tests  Brain MRI report 10/15/2019: normal, no evidence of demyelinating disease. Lumbar spine radiograph report 07/26/19: "no acue abnormality in lumbar spine" shows L5 transitional verebral body, disc heights maintained. Radiograph report of cervical spine 07/26/2019: "kpyosis in the cervical spien with areas of mild disc space narrowing at c5-6. There appears to be some posterior disc space narrowing at C7-T1.    Patient Stated Goals  to feel better and alleviate the pain and learn some exercises to do at home.    Currently in Pain?  Yes    Pain Score  4     Pain Location  Neck   joints, back   Pain Onset  More than a month ago   29 years old      Therapeutic exercise:to centralize symptoms and improve ROM, strength, muscular endurance, and activity tolerance required for successful completion of functional activities.  -Sidelying open book (thoracic rotation) to improve thoracic, shoulder girdle, and upper trunk mobility. Required instruction for technique and cuing to achieve end range as tolerated, hold time, and breathing technique.x10 reps each side with cuing for breathing and form. - clam shell with light resistance loop from home x 10 each side. Reassurance pt is using correct form.  - hooklying bridge with home band around distal thighs x 10 with good form, added harder band from home around distal thighs and 2  5# dumbbells.   - hooklying sequential leg lifts with knees flexed (R up, L up, R down, L down), focusing on stabilizing core in neutral. Patient now able to control. - hooklying dead bug with abdominal brace.  2x 10 each side. Cuing to learn activity and maintain trunk stability. With isometric pressure hands to knees using ipsilateral shoulder flexion with hip extension.  - wall angels x 20, cuing for proper technique.  - row in  90 degrees abduction, to B shoulder ER, to overhead press and back using yellow theraband. 2x10 -Education on HEP including handout - education on anatomy and physiology of relevant antatomy  Patient required additional time to explain anatomy and review HEP.   HOME EXERCISE PROGRAM     Access Code: [email or web address]  URL: [email or web address]  Date: 09/26/2019  Prepared by: Rosita Kea   Exercises Sidelying Thoracic Rotation with Open Book - 10 reps - one breath hold - 1x daily Clam with Resistance - 3 sets - 10 reps - 1x daily Supine Bridge with Resistance Band - 3 sets - 10 reps - 1x daily Supine March with Alternating Leg Lifts - 3 sets - 10 reps - 1x daily Row with band/cable - 3 sets - 10 reps - 1x daily Wall Angels - 3 sets - 10 reps - 1x daily Isometric Dead Bug with [place] - 3 sets - 6 reps        PT Education - 09/26/19 1353    Education Details  Exercise purpose/form. Self management techniques.    Person(s) Educated  Patient    Methods  Explanation;Demonstration;Tactile cues;Verbal cues    Comprehension  Verbalized understanding;Returned demonstration;Verbal cues required;Tactile cues required;Need further instruction       PT Short Term Goals - 09/15/19 1133      PT SHORT TERM GOAL #1   Title  Be independent with initial home exercise program for self-management of symptoms.    Baseline  Initial HEP to be provided 2nd visit (09/01/2019); provided and participating consistently (09/15/2019);    Time  3    Period  Weeks    Status  Achieved    Target Date  09/22/19        PT Long Term Goals - 09/15/19 1052      PT LONG TERM GOAL #1   Title  Be independent with a long-term home exercise program for self-management of symptoms.    Baseline  Initial HEP to be provided at 2nd visit (09/01/2019); successfully participating in HEP appropriate for stage of recovery, has not yet received final long term HEP (09/15/2019);    Time  10    Period  Weeks     Status  New    Target Date  11/10/19      PT LONG TERM GOAL #2   Title  Patient will demonstrate improved ability to perform functional tasks as exhibited by at least 10 point improvement in FOTO score    Baseline  deferred to 2nd visit (09/01/2019); FOTO  = 47 (09/08/2019);    Time  10    Period  Weeks    Status  On-going      PT LONG TERM GOAL #3   Title  Have full cervical spine and lumbar AROM with no compensations or increase in pain in all planes except intermittent end range discomfort to allow patient to complete valued activities with less difficulty.    Baseline  see objective exam - painful and limited (09/01/2019); measurement deferred due to proximity to initial eval (09/15/2019);    Time  10    Period  Weeks    Status  On-going    Target Date  11/10/19      PT LONG TERM GOAL #4   Title  Complete community, work and/or recreational activities without limitation due to current condition.    Baseline  difficulty working out, decreased quality of life, difficulty tolerating usual tasks due to distraction from pain, describes it causing feeling need to pinch or bite her wrists because she cannot stand the pain. (09/01/2019); continues to report similar limitations, but also decreased urge to bite/pinch wrists to cope with pain (one time since starting PT) (09/15/2019);    Time  10    Period  Weeks    Status  Partially Met    Target Date  11/10/19      PT LONG TERM GOAL #5   Title  Reduce pain with functional activities to equal or less than 2/10 to allow patient to complete usual activities including ADLs, IADLs, and social engagement with less difficulty.    Baseline  8/10 (09/01/2019); 7/10 (09/15/2019);    Time  10    Period  Weeks    Status  Partially Met    Target Date  11/10/19              Patient will benefit from skilled therapeutic intervention in order to improve the following deficits and impairments:     Visit Diagnosis: Cervicalgia  Chronic  bilateral low  back pain, unspecified whether sciatica present  Pain in both upper extremities  Pain in both lower extremities  Paresthesia of skin     Problem List Patient Active Problem List   Diagnosis Date Noted  . Episodic confusion 09/14/2019  . Fibromyalgia syndrome 09/14/2019  . Menstrual cramps 09/12/2019  . GERD (gastroesophageal reflux disease) 09/12/2019  . 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 Fatty liver   . Decreased libido 07/19/2019  . Vaginal dryness 07/19/2019  . Hypertrophy of labia minora 07/19/2019  . Memory loss or impairment 05/04/2019  . Numbness and tingling of both feet 04/28/2019  . Bilateral hand numbness 04/28/2019  . Borderline personality disorder ([place]) 04/08/2019  . Bipolar 1 disorder, mixed, mild ([place]) 03/21/2019  . Tobacco use disorder 03/21/2019  . Bipolar disorder ([place]) 11/21/2018  . Cannabis use disorder, mild, abuse   . B12 deficiency 10/11/2018  . Vitamin D deficiency 10/11/2018  . 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 Rectal polyp   . Severe anxiety 03/19/2018  . Chronic post-traumatic stress disorder (PTSD)   . OCD (obsessive compulsive disorder) 12/31/2017  . Attention deficit hyperactivity disorder (ADHD), combined type 12/31/2017  . Prediabetes 12/31/2017    Everlean Alstrom. Graylon Good, PT, DPT 09/26/19, 3:14 PM  [facility] [address], [place], [address] Phone: [phone]   Fax:  [phone]  Name: Courtney [name] MRN: [id number] Date of Birth: 11-07-1990

## 2019-09-29 ENCOUNTER — Telehealth (HOSPITAL_COMMUNITY): Payer: Self-pay

## 2019-09-29 NOTE — Telephone Encounter (Signed)
This is not related to duloxetine - she has been dealing with various muscle pains for years and has been recently started on Cymbalta for "fibromyalgia". I will call her anyway as she is  anxious but also forgetful and does not remember well what we talk about.

## 2019-09-29 NOTE — Telephone Encounter (Signed)
"  pressure" around her chest more lately.  Stated she has noticed this has increased since starting Cymbalta, now up to 60 mg a day and Concerta. Patient stated this "pressure" tends to come and go throughout the day, may last and hour or so at a time and then subsides.  States she has always had this but has noticed it has been more and a little worse since she started Concerta and Cymbalta.  Questions if this is just her anxiety as denies any increased heart rates, feeling flush or other cardiovascular concerns, no SOB.  Patient stated she does have an appointment with a cardiologist set for next Friday 10/07/19, just to make sure she has no current cardiac issues and reports this will be her first time seeing a cardiologist.  Agreed to send message to Dr. Hinton Dyer as she questions if this maybe more due to the medication changes or just anxiety.  States she does only take her Klonopin at once at night as tries to limit use.  Reports she has tried a 1/2 during the day and does not notice if this helps so may try a whole tablet but not to exceed what her prescription allows.  Agreed to send concerns to Dr. Hinton Dyer.

## 2019-09-30 ENCOUNTER — Ambulatory Visit (INDEPENDENT_AMBULATORY_CARE_PROVIDER_SITE_OTHER): Payer: Medicaid Other | Admitting: Licensed Clinical Social Worker

## 2019-09-30 ENCOUNTER — Other Ambulatory Visit: Payer: Self-pay

## 2019-09-30 DIAGNOSIS — F4310 Post-traumatic stress disorder, unspecified: Secondary | ICD-10-CM | POA: Diagnosis not present

## 2019-09-30 DIAGNOSIS — F909 Attention-deficit hyperactivity disorder, unspecified type: Secondary | ICD-10-CM

## 2019-09-30 DIAGNOSIS — F3161 Bipolar disorder, current episode mixed, mild: Secondary | ICD-10-CM | POA: Diagnosis not present

## 2019-09-30 DIAGNOSIS — F603 Borderline personality disorder: Secondary | ICD-10-CM | POA: Diagnosis not present

## 2019-09-30 NOTE — Progress Notes (Signed)
Virtual Visit via Video Note  I connected withChristine [id number] Webex video applicationand verified that I am speaking with the correct person using two identifiers.  I discussed the limitations, risks, security and privacy concerns of performing an evaluation and management service by telephone and the availability of in person appointments. I also discussed with the patient that there may be a patient responsible charge related to this service. The patient expressed understanding and agreed to proceed.  I discussed the assessment and treatment plan with the patient. The patient was provided an opportunity to ask questions and all were answered. The patient agreed with the plan and demonstrated an understanding of the instructions.  The patient was advised to call back or seek an in-person evaluation if the symptoms worsen or if the condition fails to improve as anticipated.  I provided1 hour of non-face-to-face time during this encounter.   [name], LCSW, LCAS ________________________ THERAPIST PROGRESS NOTE  Session Time:10:00am -11:00am  Participation Level:Active  Behavioral Response:Alert, well groomed, casually dressed, anxious mood/affect  Type of Therapy: Individual Therapy  Treatment Goals addressed:Self-care routine; anxiety management; Medication management; Journaling; Diet and exercise   Interventions:CBT  Summary: Adessa presented for virtual session today via Webex.  She was alert, oriented x5, with no evidence or self-report of SI/HI or A/V H. Kloi reported that she is taking medication as prescribed.  She denied smoking marijuana and reported that she has been clean since early January this year.  Daveigh reported scores of 3/10 for depression and 4/10 for anxiety and denied any reoccurrence of panic attacks since last session stating "I have felt pressure in my chest at times but I've been trying to  relax more".  She reported that she would be following up with her doctor about this chest pain today.  Quanna reported progress in trying to stay active each day by doing trivia to keep mind sharp, watching television, spending time with her grandmother, eating healthier food/drinking more water, and trying to walk more each day.  She denied journaling as much, but admitted "My mom has been on me about doing it more".  Skyrah reported that to keep anxiety low, she will go in her room, burn incense, and listen to calming music to support meditation, and she feels this has helped slow down her mind slightly, stating "I've always felt pressured like I'm on a timer and its hard for me to just sit and be still".  Althea also reported that she has been trying to pray more and explore religions which share her values.  Mayelin reported that she was able to recognize change in her thinking and behavior this morning before session began, as her dog had relieved itself on the floor and she stepped in it.  Modean reported that normally she would have been very upset, and this might have ruined the whole day, but instead, she shrugged it off, recognized that it wasn't too big of a deal, and was able to clean up in time to attend session with little issue.  Denesia reported that she recognized that she obsesses over things frequently and this can be distracting moment to moment, so she hopes to become more mindful and present minded so that she will be able to stay calm and grounded when faced with similar incidents in the future.  Marji reported that she would follow up virtually in 1 week.       Suicidal/Homicidal: None, without intent or plan   Therapist Response:  Clinician met with  Mayar for [employer] today.  Clinician assessed for safety and sobriety.  Clinician inquired about Ikran's emotional ratings at this time, as well as any significant changes in thoughts, feelings, or behavior  last conversation.  Clinician inquired about progress Shaolin has made towards goals recently, as well as any challenges she is facing.  Clinician inquired about any recent situations where Jared has been able to apply CBT framework presented in previous session to observe interplay between thoughts, feelings, and behavior in real time, and attempt to interrupt previous maladaptive coping patterns.  Clinician inquired about how Kylieann would have previously dealt with the incident this morning, how it would have influenced her feelings, and encouraged her to consider journaling about milestones like this to reinforce new ways of thinking and behaving.  Clinician will continue to monitor.     Plan:Meet again in 1 week virtually.   Diagnosis:Bipolar I Disorder, mixed, mild; Borderline personality disorder; PTSD; ADHD, unspecified  [name], LCSW, LCAS 09/30/19

## 2019-10-03 ENCOUNTER — Ambulatory Visit: Payer: Medicaid Other | Admitting: Physical Therapy

## 2019-10-04 ENCOUNTER — Telehealth: Payer: Self-pay | Admitting: Cardiovascular Disease

## 2019-10-04 NOTE — Telephone Encounter (Signed)
New message    Pt is calling and is wondering if her grandma can assist her to her appt because she has memory issues. She states she has seen a neurologist for this is the past     Please call

## 2019-10-04 NOTE — Telephone Encounter (Signed)
Will route to nurse to advise if okay for patient.

## 2019-10-04 NOTE — Telephone Encounter (Signed)
Spoke with the patient. She stated that she has memory problems and needs a family member with her to help with the appointment.

## 2019-10-05 ENCOUNTER — Other Ambulatory Visit: Payer: Self-pay

## 2019-10-05 ENCOUNTER — Ambulatory Visit: Payer: Medicaid Other | Admitting: Obstetrics and Gynecology

## 2019-10-05 ENCOUNTER — Encounter: Payer: Self-pay | Admitting: Obstetrics and Gynecology

## 2019-10-05 VITALS — BP 109/57 | HR 75 | Ht 59.0 in | Wt 168.6 lb

## 2019-10-05 DIAGNOSIS — N906 Unspecified hypertrophy of vulva: Secondary | ICD-10-CM

## 2019-10-05 DIAGNOSIS — R6882 Decreased libido: Secondary | ICD-10-CM

## 2019-10-05 DIAGNOSIS — Z3009 Encounter for other general counseling and advice on contraception: Secondary | ICD-10-CM | POA: Diagnosis not present

## 2019-10-05 NOTE — Progress Notes (Addendum)
Patient ID: Courtney [name], female   DOB: 11-06-1990, 29 y.o.   MRN: [id number]    [facility] Visit  @DATE @            Patient name: Courtney [name] MRN [id number]  Date of birth: 12-13-1990  CC & HPI:  Courtney [name] is a 29 y.o. female presenting today to discuss labiaplasty. The patient states that the labia is uncomfortable both with and without clothing and sometimes it will get caught in clothing, which can be painful. It is also uncomfortable to have sexual intercourse. The patient has never had any children. She also reports decreased libido from taking three Plan B pills within a one month time period. Estradiol was checked on 09/12/2019 and was within normal limits. The patient does not use any form of birth control.   ROS:  ROS   +labia discomfort +decreased libido  All systems are negative except as noted in the HPI and PMH.    Pertinent History Reviewed:   Reviewed Medical         Past Medical History:  Diagnosis Date  . ADHD    As a child  . Anxiety   . Bipolar 1 disorder ([place])   . Cervical kyphosis   . Depression   . Fatty liver   . Fibromyalgia   . OCD (obsessive compulsive disorder)   . Personality disorder ([place])   . PTSD (post-traumatic stress disorder)   . Pyloric stenosis                               Surgical Hx:    Past Surgical History:  Procedure Laterality Date  . APPENDECTOMY  02/17/2014  . COLONOSCOPY WITH PROPOFOL N/A 09/03/2018   Procedure: COLONOSCOPY WITH PROPOFOL;  Surgeon: Virgel Manifold, MD;  Location: [facility] ENDOSCOPY;  Service: Endoscopy;  Laterality: N/A;  . ESOPHAGOGASTRODUODENOSCOPY (EGD) WITH PROPOFOL N/A 09/03/2018   Procedure: ESOPHAGOGASTRODUODENOSCOPY (EGD) WITH PROPOFOL;  Surgeon: Virgel Manifold, MD;  Location: [facility] ENDOSCOPY;  Service: Endoscopy;  Laterality: N/A;  . EUS N/A 10/14/2018   Procedure: FULL UPPER ENDOSCOPIC ULTRASOUND (EUS) RADIAL;  Surgeon: Jola Schmidt, MD;  Location: [facility] ENDOSCOPY;   Service: Endoscopy;  Laterality: N/A;  . pyloric stenosis repair     Medications: Reviewed & Updated - see associated section                       Current Outpatient Medications:  .  Amino Acids (L-CARNITINE PO), Take 500 mg by mouth in the morning and at bedtime., Disp: , Rfl:  .  Cariprazine HCl (VRAYLAR) 4.5 MG CAPS, Take 1 capsule (4.5 mg total) by mouth daily., Disp: 90 capsule, Rfl: 0 .  Cholecalciferol (VITAMIN D) 50 MCG (2000 UT) tablet, Take by mouth., Disp: , Rfl:  .  clonazePAM (KLONOPIN) 1 MG tablet, Take 1 tablet (1 mg total) by mouth 3 (three) times daily as needed for anxiety., Disp: 90 tablet, Rfl: 0 .  DULoxetine (CYMBALTA) 30 MG capsule, Take 1 capsule (30 mg total) by mouth daily for 7 days, THEN 2 capsules (60 mg total) daily for 23 days., Disp: 53 capsule, Rfl: 0 .  ELDERBERRY PO, Take by mouth daily., Disp: , Rfl:  .  MELATONIN PO, Take 10 mg by mouth at bedtime. Gummie, Disp: , Rfl:  .  Multiple Vitamin (MULTI-VITAMIN) tablet, Take by mouth., Disp: , Rfl:  .  naproxen (NAPROSYN) 500 MG  tablet, Take 1 every 8 hours prn cramps, Disp: 60 tablet, Rfl: 1 .  Omega-3 Fatty Acids (FISH OIL) 1000 MG CAPS, Take 1,200 mg by mouth. , Disp: , Rfl:  .  omeprazole (PRILOSEC) 20 MG capsule, Take 1 capsule (20 mg total) by mouth daily., Disp: 30 capsule, Rfl: 0 .  OVER THE COUNTER MEDICATION, RSP Conjugated Linoleic Acid (CLA)    (weight loss), Disp: , Rfl:  .  OXcarbazepine (TRILEPTAL) 300 MG tablet, Take 1 tablet (300 mg total) by mouth 2 (two) times daily., Disp: 180 tablet, Rfl: 0 .  polyethylene glycol powder (GLYCOLAX/MIRALAX) 17 GM/SCOOP powder, Take one cap full of Miralax daily (Patient taking differently: Take one cap full of Miralax daily prn), Disp: 11475 g, Rfl: 0 .  pyridOXINE (VITAMIN B-6) 100 MG tablet, Take 1 tablet (100 mg total) by mouth daily. TK 1 T PO ONCE D, Disp: 90 tablet, Rfl: 0   Social History: Reviewed -  reports that she has quit smoking. Her smoking use  included cigarettes. She started smoking about 11 years ago. She quit after 0.50 years of use. She has never used smokeless tobacco.  Objective Findings:  Vitals: Blood pressure (!) 109/57, pulse 75, height 4\' 11"  (1.499 m), weight 168 lb 9.6 oz (76.5 kg), last menstrual period 10/04/2019.  PHYSICAL EXAMINATION General appearance - alert, well appearing, and in no distress and oriented to person, place, and time Mental status - normal mood, behavior, speech, dress, motor activity, and thought processes, affect appropriate to mood  PELVIC External genitalia - just lateral to the urethra on each side, she has a 3 cm by 1.5 cm area of the labia minora that is very symmetric on both sides and does not go all the way down the vaginal entrance but is very elongated.  Chaperoned by 10/06/2019.  Assessment & Plan:   A:  1. Labia discomfort due to abnormal labial elongation 2. Decreased libido 3. Birth control counsel  P:  1. Will schedule bilateral reduction labiaplasty in April, 2021 2. Estradiol within normal limits 3. Patient is considering IUD placement during procedure given info on options.    By signing my name below, I, 03-[birth year]1999, attest that this documentation has been prepared under the direction and in the presence of Nikki Dom, MD. Electronically Signed: Maleeha Tilda Burrow. 10/05/19. 2:31 PM.  I personally performed the services described in this documentation, which was SCRIBED in my presence. The recorded information has been reviewed and considered accurate. It has been edited as necessary during review. 10/07/19, MD

## 2019-10-07 ENCOUNTER — Encounter: Payer: Self-pay | Admitting: Cardiovascular Disease

## 2019-10-07 ENCOUNTER — Ambulatory Visit: Payer: Medicaid Other | Admitting: Cardiovascular Disease

## 2019-10-07 ENCOUNTER — Ambulatory Visit (HOSPITAL_COMMUNITY): Payer: Medicaid Other | Admitting: Licensed Clinical Social Worker

## 2019-10-07 ENCOUNTER — Other Ambulatory Visit: Payer: Self-pay

## 2019-10-07 VITALS — BP 110/60 | HR 80 | Ht 59.0 in | Wt 172.0 lb

## 2019-10-07 DIAGNOSIS — R079 Chest pain, unspecified: Secondary | ICD-10-CM | POA: Diagnosis not present

## 2019-10-07 MED ORDER — DULOXETINE HCL 30 MG PO CPEP: 30.0000 mg | ORAL_CAPSULE | Freq: Two times a day (BID) | ORAL | 0 refills | Status: DC
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
## 2019-10-07 NOTE — Patient Instructions (Signed)
Medication Instructions:  No changes *If you need a refill on your cardiac medications before your next appointment, please call your pharmacy*   Lab Work: None ordered If you have labs (blood work) drawn today and your tests are completely normal, you will receive your results only by: Marland Kitchen MyChart Message (if you have MyChart) OR . A paper copy in the mail If you have any lab test that is abnormal or we need to change your treatment, we will call you to review the results.   Testing/Procedures: Your physician has requested that you have an exercise tolerance test. For further information please visit [email or web address] Please also follow instruction sheet, as given. This will take place at [address], [address].  Do not drink or eat foods with caffeine for 24 hours before the test. (Chocolate, coffee, tea, or energy drinks)  If you use an inhaler, bring it with you to the test.  Do not smoke for 4 hours before the test.  Wear comfortable shoes and clothing. You will need to have a covid test prior.  Your physician has requested that you have an echocardiogram. Echocardiography is a painless test that uses sound waves to create images of your heart. It provides your doctor with information about the size and shape of your heart and how well your heart's chambers and valves are working. You may receive an ultrasound enhancing agent through an IV if needed to better visualize your heart during the echo.This procedure takes approximately one hour. There are no restrictions for this procedure. This will take place at the [address], [address].   Follow-Up: At [facility], you and your health needs are our priority.  As part of our continuing mission to provide you with exceptional heart care, we have created designated Provider Care Teams.  These Care Teams include your primary Cardiologist (physician) and Advanced Practice Providers (APPs -  Physician Assistants and Nurse  Practitioners) who all work together to provide you with the care you need, when you need it.  We recommend signing up for the patient portal called "MyChart".  Sign up information is provided on this After Visit Summary.  MyChart is used to connect with patients for Virtual Visits (Telemedicine).  Patients are able to view lab/test results, encounter notes, upcoming appointments, etc.  Non-urgent messages can be sent to your provider as well.   To learn more about what you can do with MyChart, go to [email or web address].    Your next appointment:   Follow up as needed with Dr. Royann Shivers.

## 2019-10-08 ENCOUNTER — Encounter: Payer: Self-pay | Admitting: Cardiovascular Disease

## 2019-10-10 ENCOUNTER — Ambulatory Visit (INDEPENDENT_AMBULATORY_CARE_PROVIDER_SITE_OTHER): Payer: Medicaid Other | Admitting: Licensed Clinical Social Worker

## 2019-10-10 ENCOUNTER — Other Ambulatory Visit: Payer: Self-pay

## 2019-10-10 ENCOUNTER — Ambulatory Visit: Payer: Medicaid Other | Admitting: Cardiology

## 2019-10-10 DIAGNOSIS — F4310 Post-traumatic stress disorder, unspecified: Secondary | ICD-10-CM | POA: Diagnosis not present

## 2019-10-10 DIAGNOSIS — F3161 Bipolar disorder, current episode mixed, mild: Secondary | ICD-10-CM

## 2019-10-10 DIAGNOSIS — F603 Borderline personality disorder: Secondary | ICD-10-CM

## 2019-10-10 DIAGNOSIS — F909 Attention-deficit hyperactivity disorder, unspecified type: Secondary | ICD-10-CM | POA: Diagnosis not present

## 2019-10-10 NOTE — Progress Notes (Signed)
Virtual Visit via Video Note  I connected withChristine [name] Webex video applicationand verified that I am speaking with the correct person using two identifiers.  I discussed the limitations, risks, security and privacy concerns of performing an evaluation and management service by telephone and the availability of in person appointments. I also discussed with the patient that there may be a patient responsible charge related to this service. The patient expressed understanding and agreed to proceed.  I discussed the assessment and treatment plan with the patient. The patient was provided an opportunity to ask questions and all were answered. The patient agreed with the plan and demonstrated an understanding of the instructions.  The patient was advised to call back or seek an in-person evaluation if the symptoms worsen or if the condition fails to improve as anticipated.  I provided30 minutes of non-face-to-face time during this encounter.   Courtney Flood, LCSW, LCAS ________________________ THERAPIST PROGRESS NOTE  Session Time:4:00pm -4:30pm  Participation Level:Active  Behavioral Response:Alert, well groomed, casually dressed, euthymicmood/affect  Type of Therapy: Individual Therapy  Treatment Goals addressed:Self-care routine; anxiety management;Medication management; Journaling; Diet and exercise  Interventions:CBT, progressive muscle relaxation   Summary:Courtney [name] is a 29 year old female that presented for virtual session today via Webex and is diagnosed with Bipolar I Disorder, mixed, mild; Borderline personality disorder; PTSD; and ADHD, unspecified.    Suicidal/Homicidal:None; without intent or plan  Therapist Response:  Clinician met with Courtney [name] via Webex application today and assessed for safety, sobriety, and medication compliance.  Courtney [name] reported presented as alert, oriented x5, with no evidence  or self-report of SI/HI or A/V H.  Courtney [name] reported that she has remained compliant with medications and denied any use of illicit substances since last session.  Clinician inquired about Courtney [name]'s current emotional ratings, as well as any significant changes in thoughts, feelings, or behavior since last session.  Courtney [name] reported scores of 0/10 for depression and 1/10 for anxiety.  She denied any signs of mania and reported that she feels like her mood has been more stable and upbeat in past week.  Clinician inquired about any progress towards goals that Courtney [name] has made recently, as well as present barriers.  Courtney [name] reported that she has continued setting aside time for self-care such as listening to music, talking to her supports, and watching movies; buying a new journal so that she can continue documenting changes in thoughts, feelings, and behavior day to day; working on curbing excessive spending while setting aside $100 recently for savings; and trying to "Think first when I'm upset instead of acting on my emotions".  Courtney [name] reported that she has not worked out in 4 days, nor utilized IT trainer as frequently, but plans to correct this during the week. Courtney [name] noted that she tends to experience anxiety related tension, so clinician offered to teach her progressive muscle relaxation technique today, and she was agreeable.  Clinician explained how anxiety can manifest physically in the body in several ways, including back pain, headaches, and jaw pain from clenching or grinding teeth.  Clinician guided Courtney [name] through process of sequentially going through the different muscle groups of the body (arms, legs, face, chest/stomach, etc), tensing muscles for 5 seconds, and then releasing them while maintaining a calm breathing rhythm to stay grounded and focused.  Clinician encouraged Courtney [name] not to tense to the point of pain or discomfort, and avoid areas where past injuries might  flare up.  Courtney [name] participated in activity and reported that this did alleviate some discomfort  and she plans to add it to her relaxation routine.  Clinician will continue to monitor.      Plan:Meet again in 1 week virtually.   Diagnosis:Bipolar I Disorder, mixed, mild; Borderline personality disorder; PTSD; ADHD, unspecified  Courtney Flood, LCSW, LCAS 10/10/19

## 2019-10-12 ENCOUNTER — Encounter: Payer: Self-pay | Admitting: Physical Therapy

## 2019-10-12 ENCOUNTER — Ambulatory Visit: Payer: Medicaid Other | Attending: Neurology | Admitting: Physical Therapy

## 2019-10-12 ENCOUNTER — Other Ambulatory Visit: Payer: Self-pay

## 2019-10-12 DIAGNOSIS — M542 Cervicalgia: Secondary | ICD-10-CM | POA: Insufficient documentation

## 2019-10-12 DIAGNOSIS — R202 Paresthesia of skin: Secondary | ICD-10-CM | POA: Insufficient documentation

## 2019-10-12 DIAGNOSIS — M79602 Pain in left arm: Secondary | ICD-10-CM | POA: Insufficient documentation

## 2019-10-12 DIAGNOSIS — M79601 Pain in right arm: Secondary | ICD-10-CM | POA: Diagnosis present

## 2019-10-12 DIAGNOSIS — G8929 Other chronic pain: Secondary | ICD-10-CM | POA: Diagnosis present

## 2019-10-12 DIAGNOSIS — M79605 Pain in left leg: Secondary | ICD-10-CM | POA: Diagnosis present

## 2019-10-12 DIAGNOSIS — M79604 Pain in right leg: Secondary | ICD-10-CM | POA: Diagnosis present

## 2019-10-12 DIAGNOSIS — M545 Low back pain, unspecified: Secondary | ICD-10-CM

## 2019-10-12 NOTE — Therapy (Signed)
[name] [address] [place], [address] Phone: [phone]   Fax:  [phone]  Physical Therapy Treatment  Patient Details  Name: Courtney [name] MRN: [id number] Date of Birth: March 23, 1991 Referring Provider (PT): Gurney Maxin, MD   Encounter Date: 10/12/2019  PT End of Session - 10/13/19 2025    Visit Number  5    Number of Visits  16    Date for PT Re-Evaluation  11/10/19    Authorization Type  Medicaid reporting period from 09/01/2019    Authorization - Visit Number  2    Authorization - Number of Visits  12    PT Start Time  2563    PT Stop Time  1428    PT Time Calculation (min)  40 min    Activity Tolerance  Patient tolerated treatment well    Behavior During Therapy  [facility] for tasks assessed/performed       Past Medical History:  Diagnosis Date  . ADHD    As a child  . Anxiety   . Bipolar 1 disorder ([place])   . Cervical kyphosis   . Depression   . Fatty liver   . Fibromyalgia   . OCD (obsessive compulsive disorder)   . Personality disorder ([place])   . PTSD (post-traumatic stress disorder)   . Pyloric stenosis     Past Surgical History:  Procedure Laterality Date  . APPENDECTOMY  02/17/2014  . COLONOSCOPY WITH PROPOFOL N/A 09/03/2018   Procedure: COLONOSCOPY WITH PROPOFOL;  Surgeon: Virgel Manifold, MD;  Location: [facility] ENDOSCOPY;  Service: Endoscopy;  Laterality: N/A;  . ESOPHAGOGASTRODUODENOSCOPY (EGD) WITH PROPOFOL N/A 09/03/2018   Procedure: ESOPHAGOGASTRODUODENOSCOPY (EGD) WITH PROPOFOL;  Surgeon: Virgel Manifold, MD;  Location: [facility] ENDOSCOPY;  Service: Endoscopy;  Laterality: N/A;  . EUS N/A 10/14/2018   Procedure: FULL UPPER ENDOSCOPIC ULTRASOUND (EUS) RADIAL;  Surgeon: Jola Schmidt, MD;  Location: [facility] ENDOSCOPY;  Service: Endoscopy;  Laterality: N/A;  . pyloric stenosis repair      There were no vitals filed for this visit.  Subjective Assessment - 10/12/19 1349    Subjective  Patient reports she is feeling okay physically. She is a bit dissapointed with herself because she took a 5 day break from her workouts for no reason. She went back to it this morning and feels okay. She cannot remember how to do one of the exercises and would like to go over it. She saw a cardiologist who said she has mitral prolapse and he is scheduling her for a stress test on the treadmill and an echocardiogram. He also thought her chest pain might be from the ribs/lungs and suggested she speak to the neurologist about her thoracic spine.    Pertinent History  Patient is a 29 y.o. female who presents to outpatient physical therapy with a referral for medical diagnosis numbness and tingling. This patient's chief complaints consist of burning pain from the base of her neck down both arms, over her entire spine and down both legs with intermittent tingling in her hands and feet she also complains of joint pain especially at the R knee. This leading to the following functional deficits: difficulty working out, decreased quality of life, difficulty tolerating usual tasks due to distraction from pain, describes it causing feeling need to pinch or bite her wrists because she cannot stand the pain. Relevant past medical history and comorbidities include fibromyalgia, bipolar 1 disorder, memory loss/confusion, borderline personality disorder, OCD, PTSD, fatty  liver, pyloric stenosis, seizures as a child, ADHD, severe anxiety, pre-diabetes, depression, history of concussion within the last 6 months, former smoker. Currently sees mental health counselor as well as psychiatrist. States she feels she has adequate mental health support at this time. Denies stroke, lung problems, cardiac problems (except occasional chest pain she associates with anxiety), cancer. MS was already ruled out.    Limitations  Other (comment);House hold activities   difficulty working out, decreased quality of life, difficulty  tolerating usual tasks due to distraction from pain, describes it causing feeling need to pinch or bite her wrists because she cannot stand the pain. makes everything harder   Diagnostic tests  Brain MRI report 10/15/2019: normal, no evidence of demyelinating disease. Lumbar spine radiograph report 07/26/19: "no acue abnormality in lumbar spine" shows L5 transitional verebral body, disc heights maintained. Radiograph report of cervical spine 07/26/2019: "kpyosis in the cervical spien with areas of mild disc space narrowing at c5-6. There appears to be some posterior disc space narrowing at C7-T1.    Patient Stated Goals  to feel better and alleviate the pain and learn some exercises to do at home.    Currently in Pain?  Yes    Pain Score  4     Pain Location  Back    Pain Orientation  --   whole back   Pain Descriptors / Indicators  Burning    Pain Type  Chronic pain    Pain Onset  More than a month ago   29 years old      Therapeutic exercise:to centralize symptoms and improve ROM, strength, muscular endurance, and activity tolerance required for successful completion of functional activities. - Hooklying dead bug (supine with hips, knees, and shoulders flexed to 90 starting position then alternating hip/knee extension and contralateral shoulder flexion with abdominal activation). 3 second holds, 3x7 each side time for instruction, rest, and transition. cuing for care activation and to keep shins parallel to ground.  - banded glute bridge with hip abduction using cloth band from home x20.   - frog bridge with cloth mini-band from home, 3x10 - hooklying on foam roll (placed along spine), angels with active retraction and scpaular control, x20  - hooklying on foam roll (placed along spine), pec stretch x 30 seconds  - side plank with clam shellwith no resistance loop3x 10each side. - hooklying bridge with marching, maintaining hips lifted without dropping, 3x5 each side.   -Education on HEP  including handout - education on exercise purpose/form  Pt required multimodal cuing for proper technique and to facilitate improved neuromuscular control, strength, range of motion, and functional ability resulting in improved performance and form.  HOME EXERCISE PROGRAM Access Code: [email or web address]  URL: [email or web address]  Date: 10/12/2019  Prepared by: Rosita Kea   Exercises Sidelying Thoracic Rotation with Open Book - 10 reps - one breath hold - 1x daily Clam with Resistance - 3 sets - 10 reps - 1x daily Side Plank with Clam and Resistance - 3 sets - 5 reps - 1x daily Supine Bridge with Resistance Band - 4 sets - 10 reps - 1x daily Supine Dead Bug with Leg Extension - 3 sets - 7 reps Supine Static Chest Stretch on Foam Roll - 2 min hold [place] on Foam Roll - 20 reps Wall Angels - 3 sets - 10 reps - 1x daily Row with band/cable - 3 sets - 10 reps - 1x daily       PT Education - 10/13/19  2024    Education Details  Exercise purpose/form. Self management techniques. HEP    Person(s) Educated  Patient    Methods  Explanation;Demonstration;Tactile cues;Verbal cues;Handout    Comprehension  Verbalized understanding;Returned demonstration;Verbal cues required;Tactile cues required;Need further instruction       PT Short Term Goals - 09/15/19 1133      PT SHORT TERM GOAL #1   Title  Be independent with initial home exercise program for self-management of symptoms.    Baseline  Initial HEP to be provided 2nd visit (09/01/2019); provided and participating consistently (09/15/2019);    Time  3    Period  Weeks    Status  Achieved    Target Date  09/22/19        PT Long Term Goals - 09/15/19 1052      PT LONG TERM GOAL #1   Title  Be independent with a long-term home exercise program for self-management of symptoms.    Baseline  Initial HEP to be provided at 2nd visit (09/01/2019); successfully participating in HEP appropriate for stage of recovery, has  not yet received final long term HEP (09/15/2019);    Time  10    Period  Weeks    Status  New    Target Date  11/10/19      PT LONG TERM GOAL #2   Title  Patient will demonstrate improved ability to perform functional tasks as exhibited by at least 10 point improvement in FOTO score    Baseline  deferred to 2nd visit (09/01/2019); FOTO  = 47 (09/08/2019);    Time  10    Period  Weeks    Status  On-going      PT LONG TERM GOAL #3   Title  Have full cervical spine and lumbar AROM with no compensations or increase in pain in all planes except intermittent end range discomfort to allow patient to complete valued activities with less difficulty.    Baseline  see objective exam - painful and limited (09/01/2019); measurement deferred due to proximity to initial eval (09/15/2019);    Time  10    Period  Weeks    Status  On-going    Target Date  11/10/19      PT LONG TERM GOAL #4   Title  Complete community, work and/or recreational activities without limitation due to current condition.    Baseline  difficulty working out, decreased quality of life, difficulty tolerating usual tasks due to distraction from pain, describes it causing feeling need to pinch or bite her wrists because she cannot stand the pain. (09/01/2019); continues to report similar limitations, but also decreased urge to bite/pinch wrists to cope with pain (one time since starting PT) (09/15/2019);    Time  10    Period  Weeks    Status  Partially Met    Target Date  11/10/19      PT LONG TERM GOAL #5   Title  Reduce pain with functional activities to equal or less than 2/10 to allow patient to complete usual activities including ADLs, IADLs, and social engagement with less difficulty.    Baseline  8/10 (09/01/2019); 7/10 (09/15/2019);    Time  10    Period  Weeks    Status  Partially Met    Target Date  11/10/19            Plan - 10/13/19 2031    Clinical Impression Statement  Patient tolerated treatment well and was able  to advance several exercises today. Continues to require specific cuing and reassurance to complete exercises properly but demonstrates good carry over and participation in HEP. Continues to be bothered by pain in general. Patient would benefit from continued management of limiting condition by skilled physical therapist to address remaining impairments and functional limitations to work towards stated goals and return to PLOF or maximal functional independence.    Personal Factors and Comorbidities  Behavior Pattern;Comorbidity 3+;Education;Social Background;Fitness;Time since onset of injury/illness/exacerbation;Age    Comorbidities  Relevant past medical history and comorbidities include fibromyalgia, bipolar 1 disorder, memory loss/confusion, borderline personality disorder, OCD, PTSD, fatty liver, pyloric stenosis, seizures as a child, ADHD, severe anxiety, pre-diabetes, depression, history of concussion within the last 6 months, former smoker.    Examination-Activity Limitations  Other   functional deficits: difficulty working out, decreased quality of life, difficulty tolerating usual tasks due to distraction from pain, describes it causing feeling need to pinch or bite her wrists because she cannot stand the pain.   Examination-Participation Restrictions  Community Activity;Interpersonal Relationship;Other   affects all activities when pain is too high to participate comfortably   Stability/Clinical Decision Making  Evolving/Moderate complexity    Rehab Potential  Fair    PT Frequency  2x / week    PT Duration  Other (comment)   10 weeks   PT Treatment/Interventions  ADLs/Self Care Home Management;Aquatic Therapy;Biofeedback;Cryotherapy;Moist Heat;Traction;Electrical Stimulation;Therapeutic activities;Therapeutic exercise;Balance training;Neuromuscular re-education;Patient/family education;Cognitive remediation;Manual techniques;Passive range of motion;Dry needling;Spinal Manipulations;Joint  Manipulations    PT Next Visit Plan  progress core, postural, LE and UE strengthening and mobility as tolerated    PT Home Exercise Plan  Medbridge Access Code: [email or web address]    Consulted and Agree with Plan of Care  Patient       Patient will benefit from skilled therapeutic intervention in order to improve the following deficits and impairments:  Impaired sensation, Improper body mechanics, Pain, Postural dysfunction, Increased muscle spasms, Decreased activity tolerance, Decreased endurance, Decreased range of motion, Decreased strength, Impaired perceived functional ability, Impaired UE functional use, Obesity, Impaired flexibility, Decreased balance, Difficulty walking  Visit Diagnosis: Cervicalgia  Chronic bilateral low back pain, unspecified whether sciatica present  Pain in both upper extremities  Pain in both lower extremities  Paresthesia of skin     Problem List Patient Active Problem List   Diagnosis Date Noted  . Episodic confusion 09/14/2019  . Fibromyalgia syndrome 09/14/2019  . Menstrual cramps 09/12/2019  . GERD (gastroesophageal reflux disease) 09/12/2019  . 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 Fatty liver   . Decreased libido 07/19/2019  . Vaginal dryness 07/19/2019  . Labial hypertrophy 07/19/2019  . Memory loss or impairment 05/04/2019  . Numbness and tingling of both feet 04/28/2019  . Bilateral hand numbness 04/28/2019  . Borderline personality disorder ([place]) 04/08/2019  . Bipolar 1 disorder, mixed, mild ([place]) 03/21/2019  . Tobacco use disorder 03/21/2019  . Bipolar disorder ([place]) 11/21/2018  . Cannabis use disorder, mild, abuse   . B12 deficiency 10/11/2018  . Vitamin D deficiency 10/11/2018  . 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 Rectal polyp   . Severe anxiety 03/19/2018  . Chronic post-traumatic stress disorder (PTSD)   . OCD (obsessive compulsive disorder) 12/31/2017  . Attention deficit hyperactivity disorder (ADHD), combined type 12/31/2017    . Prediabetes 12/31/2017    Everlean Alstrom. Graylon Good, PT, DPT 10/13/19, 8:32 PM   [facility] [address], [place], [address] Phone: [phone]  Fax:  [phone]  Name: Kensie Susman MRN: [id number] Date of Birth: [birth year]

## 2019-10-14 ENCOUNTER — Ambulatory Visit (HOSPITAL_COMMUNITY): Payer: Medicaid Other | Admitting: Licensed Clinical Social Worker

## 2019-10-16 ENCOUNTER — Other Ambulatory Visit: Payer: Self-pay | Admitting: Family Medicine

## 2019-10-16 DIAGNOSIS — R7989 Other specified abnormal findings of blood chemistry: Secondary | ICD-10-CM

## 2019-10-17 ENCOUNTER — Ambulatory Visit: Payer: Medicaid Other | Admitting: Physical Therapy

## 2019-10-18 ENCOUNTER — Encounter: Payer: Self-pay | Admitting: Orthopaedic Surgery

## 2019-10-18 ENCOUNTER — Ambulatory Visit: Payer: Medicaid Other | Admitting: Orthopaedic Surgery

## 2019-10-18 ENCOUNTER — Ambulatory Visit: Payer: Medicaid Other

## 2019-10-18 ENCOUNTER — Telehealth: Payer: Self-pay | Admitting: Orthopaedic Surgery

## 2019-10-18 ENCOUNTER — Other Ambulatory Visit: Payer: Self-pay

## 2019-10-18 ENCOUNTER — Ambulatory Visit (INDEPENDENT_AMBULATORY_CARE_PROVIDER_SITE_OTHER): Payer: Medicaid Other | Admitting: Psychiatry

## 2019-10-18 VITALS — BP 121/71 | HR 78 | Temp 97.9°F | Ht 59.0 in | Wt 173.0 lb

## 2019-10-18 DIAGNOSIS — G8929 Other chronic pain: Secondary | ICD-10-CM | POA: Diagnosis not present

## 2019-10-18 DIAGNOSIS — F603 Borderline personality disorder: Secondary | ICD-10-CM | POA: Diagnosis not present

## 2019-10-18 DIAGNOSIS — F3161 Bipolar disorder, current episode mixed, mild: Secondary | ICD-10-CM

## 2019-10-18 DIAGNOSIS — M25561 Pain in right knee: Secondary | ICD-10-CM

## 2019-10-18 DIAGNOSIS — M797 Fibromyalgia: Secondary | ICD-10-CM

## 2019-10-18 DIAGNOSIS — F902 Attention-deficit hyperactivity disorder, combined type: Secondary | ICD-10-CM | POA: Diagnosis not present

## 2019-10-18 MED ORDER — DULOXETINE HCL 60 MG PO CPEP: 60.0000 mg | ORAL_CAPSULE | Freq: Every day | ORAL | 0 refills | Status: DC

## 2019-10-18 MED ORDER — NAPROXEN 500 MG PO TABS: 500.0000 mg | ORAL_TABLET | Freq: Two times a day (BID) | ORAL | 5 refills | Status: DC

## 2019-10-18 NOTE — Telephone Encounter (Signed)
Patient relayed at check-out that she would need a refill on medication: naproxen (NAPROSYN) 500 MG tablet 60 tablet  -[facility] on [address], [place]  (may have been prescribed by another provider - please advise)
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
## 2019-10-19 ENCOUNTER — Encounter: Payer: Medicaid Other | Admitting: Physical Therapy

## 2019-10-20 ENCOUNTER — Telehealth (HOSPITAL_COMMUNITY): Payer: Self-pay

## 2019-10-20 ENCOUNTER — Ambulatory Visit (INDEPENDENT_AMBULATORY_CARE_PROVIDER_SITE_OTHER): Payer: Medicaid Other | Admitting: Licensed Clinical Social Worker

## 2019-10-20 ENCOUNTER — Other Ambulatory Visit: Payer: Self-pay

## 2019-10-20 DIAGNOSIS — F3161 Bipolar disorder, current episode mixed, mild: Secondary | ICD-10-CM | POA: Diagnosis not present

## 2019-10-20 DIAGNOSIS — F4310 Post-traumatic stress disorder, unspecified: Secondary | ICD-10-CM | POA: Diagnosis not present

## 2019-10-20 DIAGNOSIS — F603 Borderline personality disorder: Secondary | ICD-10-CM | POA: Diagnosis not present

## 2019-10-20 DIAGNOSIS — F909 Attention-deficit hyperactivity disorder, unspecified type: Secondary | ICD-10-CM | POA: Diagnosis not present

## 2019-10-20 NOTE — Progress Notes (Signed)
Virtual Visit via Video Note   I connected with Courtney [name] on 10/20/19 at 3:00pm by [facility] video application and verified that I am speaking with the correct person using two identifiers.   I discussed the limitations, risks, security and privacy concerns of performing an evaluation and management service by telephone and the availability of in person appointments. I also discussed with the patient that there may be a patient responsible charge related to this service. The patient expressed understanding and agreed to proceed.   I discussed the assessment and treatment plan with the patient. The patient was provided an opportunity to ask questions and all were answered. The patient agreed with the plan and demonstrated an understanding of the instructions.   The patient was advised to call back or seek an in-person evaluation if the symptoms worsen or if the condition fails to improve as anticipated.   I provided 1 hour of non-face-to-face time during this encounter.     [name], LCSW, LCAS ________________________ THERAPIST PROGRESS NOTE   Session Time: 3:00pm - 4:00pm   Participation Level: Active   Behavioral Response: Alert, well groomed, casually dressed, euthymic mood/affect   Type of Therapy:  Individual Therapy   Treatment Goals addressed: Self-care routine; anxiety management; Medication management; Journaling; Diet and exercise   Interventions: CBT   Summary: Courtney [name] is a 29 year old female that presented for virtual session today via Webex and is diagnosed with Bipolar I Disorder, mixed, mild; Borderline personality disorder; PTSD; and ADHD, unspecified.        Suicidal/Homicidal: None; without intent or plan   Therapist Response:  Clinician met with Courtney [name] virtually via Webex today and assessed for safety, sobriety, and medication compliance.  Georgette presented as alert, oriented x5, with no evidence or self-report of SI/HI or A/V H.  Joei  reported ongoing compliance with medications and denied any use of illicit substances.  Clinician inquired about Courtney [name]'s emotional ratings today, as well as any significant changes in thoughts, feelings, or behavior since previous conversation.  Courtney [name] reported scores of 0/10 for depression and 1/10 for anxiety, which is consistent with previous session.  Courtney [name] reported stable mood, and one occurrence of a panic attack in previous week where she was triggered by something on social media, but was able to call her mother for reassurance after allowing herself time to cry.  Clinician inquired about progress towards goals Hang made in past week in areas such as self-care, use of relaxation techniques, exercise, and journal.  Courtney [name] reported that she has been proactive and motivated, writing poetry in her journal twice for emotional outlet, socializing more, going to a walking trail when the weather was nice, watching her diet, and improving exercise routine.  She reported averaging 9 hours of sleep typically, and is keeping up with medical appointments.  Courtney [name] reported that one area she is struggling with is procrastination towards some other goals, such as regularly using relaxation techniques, and formulating a routine for physical exercise.  Clinician sent her a link for a worksheet on goal breakdown, which explained how big goals can be difficult to tackle, and emphasized breaking these down into smaller, more manageable tasks to help overcome stress and reduce chances of procrastination.  This handout featured a table where goals could be labeled, broken down, and then sorted by time required, and where they could be realistically placed on schedule.  Clinician worked with Courtney [name] to break down her workout routine as an example, including setting aside calisthenics  on Wednesday and Friday for 35 minutes at 9am, doing weight training on Thursday at [place] [employer] for 30 minutes, and cardio on  Saturdays and Sundays for 45 minutes at 3-4pm.  Courtney [name] reported that this was helpful for breaking down her overarching goal of working out more often to become healthier and more confident, and would commit to this new routine starting next week to determine effectiveness.  Clinician will continue to monitor.       Plan: Meet again in 1 week virtually.   Diagnosis: Bipolar I Disorder, mixed, mild; Borderline personality disorder; PTSD; ADHD, unspecified    [name], LCSW, LCAS 10/20/19

## 2019-10-20 NOTE — Telephone Encounter (Signed)
Encounter complete. 

## 2019-10-21 ENCOUNTER — Other Ambulatory Visit (HOSPITAL_COMMUNITY)
Admission: RE | Admit: 2019-10-21 | Discharge: 2019-10-21 | Disposition: A | Discharge status: Home / Self Care | Payer: Medicaid Other | Source: Ambulatory Visit | Attending: Cardiovascular Disease | Admitting: Cardiovascular Disease

## 2019-10-21 DIAGNOSIS — Z01812 Encounter for preprocedural laboratory examination: Secondary | ICD-10-CM | POA: Diagnosis present

## 2019-10-21 DIAGNOSIS — Z20822 Contact with and (suspected) exposure to covid-19: Secondary | ICD-10-CM | POA: Insufficient documentation

## 2019-10-22 LAB — SARS CORONAVIRUS 2 (TAT 6-24 HRS): SARS Coronavirus 2: NEGATIVE

## 2019-10-24 ENCOUNTER — Other Ambulatory Visit: Payer: Self-pay

## 2019-10-24 ENCOUNTER — Encounter: Payer: Medicaid Other | Admitting: Physical Therapy

## 2019-10-24 ENCOUNTER — Ambulatory Visit (HOSPITAL_COMMUNITY): Payer: Medicaid Other | Attending: Internal Medicine

## 2019-10-24 DIAGNOSIS — R079 Chest pain, unspecified: Secondary | ICD-10-CM | POA: Insufficient documentation

## 2019-10-25 ENCOUNTER — Ambulatory Visit (HOSPITAL_COMMUNITY)
Admission: RE | Admit: 2019-10-25 | Discharge: 2019-10-25 | Disposition: A | Discharge status: Home / Self Care | Payer: Medicaid Other | Source: Ambulatory Visit | Attending: Cardiology | Admitting: Cardiology

## 2019-10-25 DIAGNOSIS — R079 Chest pain, unspecified: Secondary | ICD-10-CM | POA: Insufficient documentation

## 2019-10-25 LAB — EXERCISE TOLERANCE TEST
Estimated workload: 8.5 METS
Exercise duration (min): 7 min
Exercise duration (sec): 0 s
MPHR: 193 {beats}/min
Peak HR: 173 {beats}/min
Percent HR: 89 %
RPE: 17
Rest HR: 99 {beats}/min

## 2019-10-26 ENCOUNTER — Encounter: Payer: Medicaid Other | Admitting: Physical Therapy

## 2019-10-27 ENCOUNTER — Other Ambulatory Visit: Payer: Self-pay

## 2019-10-27 ENCOUNTER — Ambulatory Visit (INDEPENDENT_AMBULATORY_CARE_PROVIDER_SITE_OTHER): Payer: Medicaid Other | Admitting: Licensed Clinical Social Worker

## 2019-10-27 DIAGNOSIS — F4310 Post-traumatic stress disorder, unspecified: Secondary | ICD-10-CM | POA: Diagnosis not present

## 2019-10-27 DIAGNOSIS — F909 Attention-deficit hyperactivity disorder, unspecified type: Secondary | ICD-10-CM

## 2019-10-27 DIAGNOSIS — F3161 Bipolar disorder, current episode mixed, mild: Secondary | ICD-10-CM | POA: Diagnosis not present

## 2019-10-27 DIAGNOSIS — F603 Borderline personality disorder: Secondary | ICD-10-CM

## 2019-10-27 NOTE — Progress Notes (Signed)
Virtual Visit via Video Note   I connected with Courtney [name] on 10/27/19 at 11:00am by [facility] video application and verified that I am speaking with the correct person using two identifiers.   I discussed the limitations, risks, security and privacy concerns of performing an evaluation and management service by telephone and the availability of in person appointments. I also discussed with the patient that there may be a patient responsible charge related to this service. The patient expressed understanding and agreed to proceed.   I discussed the assessment and treatment plan with the patient. The patient was provided an opportunity to ask questions and all were answered. The patient agreed with the plan and demonstrated an understanding of the instructions.   The patient was advised to call back or seek an in-person evaluation if the symptoms worsen or if the condition fails to improve as anticipated.   I provided 1 hour of non-face-to-face time during this encounter.     [name], LCSW, LCAS ________________________ THERAPIST PROGRESS NOTE   Session Time: 11:00am - 12:00pm   Participation Level: Active   Behavioral Response: Alert, well groomed, casually dressed, euthymic mood/affect   Type of Therapy:  Individual Therapy   Treatment Goals addressed: Self-care routine; anxiety management; Medication management; Diet and exercise; Communication skills   Interventions: CBT, social/communication skills    Summary: Courtney [name] is a 29 year old female that presented for virtual session today via Webex and is diagnosed with Bipolar I Disorder, mixed, mild; Borderline personality disorder; PTSD; and ADHD, unspecified.        Suicidal/Homicidal: None; without intent or plan   Therapist Response:  Clinician met with Courtney [name] virtually via Webex today and assessed for safety, sobriety, and medication compliance.  [name] presented for session alert, oriented x5, with no  evidence or self-report of SI/HI or A/V H.  Courtney [name] reported that she continues taking medication as prescribed and denied any alcohol or illicit substance use.  Clinician inquired about Courtney [name]'s current emotional ratings, as well as any significant changes in thoughts, feelings, or behavior since last session.  Courtney [name] reported scores of 0/10 for both depression and anxiety today, stating "I'm in a great mood".  Courtney [name] reported that things have continued to go well for her recently, and she has not had any recent panic attacks, although she has experienced some physical pain, and is in communication with her doctor.  Clinician revisited treatment plan to identify where progress is being made on goals at this time.  Courtney [name] reported that she continue to sleep better, has been trying to maintain a regular routine, and exercise according to her previously set schedule.  She reported that she will be picking up a friend today and they plan to go eat, and watch movies later for leisure.  Courtney [name] reported that one area she is focused on at this time is trying to improve her social/communication skills, as she finds it difficult to interact with other people at times.  Clinician discussed areas of effective communication with Courtney [name] to improve skills, including importance of paying attention to one's eye contact, facial expression, tone/volume of voice, and nonverbal body language.  Clinician also discussed how to be more assertive by clearly expressing what she needs from another person, why she needs/wants this to be done, and how it would make her feel if conditions were to be agreed upon.  Courtney [name] reported that she tends to be good with eye contact, but can sometimes have an irritable expression on her  face, speaks in a loud volume, and can use her hands frequently when she speaks animatedly about something.  She reported that she also finds it hard to say "No" at times, and will try to practice  more assertive, but polite responses.  Clinician encouraged Courtney [name] to speak with her supports about this topic as well to see if they can provide any additional suggestions based upon their interactions with her, be mindful of how her emotions/mood can influence communication/presentation day to day, and consider engaging in virtual community groups through agencies like [facility] so she can interact with individuals that may share similar mental health issues, which would aid in helping her practice new skills.  Clinician will continue to monitor.       Plan: Meet again in 1 week virtually.   Diagnosis: Bipolar I Disorder, mixed, mild; Borderline personality disorder; PTSD; ADHD, unspecified    [name], LCSW, LCAS 10/27/19

## 2019-10-31 ENCOUNTER — Encounter: Payer: Medicaid Other | Admitting: Physical Therapy

## 2019-11-01 ENCOUNTER — Other Ambulatory Visit: Payer: Self-pay | Admitting: Family Medicine

## 2019-11-01 DIAGNOSIS — R202 Paresthesia of skin: Secondary | ICD-10-CM

## 2019-11-01 DIAGNOSIS — M549 Dorsalgia, unspecified: Secondary | ICD-10-CM

## 2019-11-01 DIAGNOSIS — R200 Anesthesia of skin: Secondary | ICD-10-CM

## 2019-11-02 ENCOUNTER — Telehealth: Payer: Self-pay

## 2019-11-02 ENCOUNTER — Telehealth: Payer: Self-pay | Admitting: Physical Therapy

## 2019-11-02 ENCOUNTER — Encounter: Payer: Medicaid Other | Admitting: Physical Therapy

## 2019-11-02 DIAGNOSIS — M549 Dorsalgia, unspecified: Secondary | ICD-10-CM

## 2019-11-02 DIAGNOSIS — R200 Anesthesia of skin: Secondary | ICD-10-CM

## 2019-11-02 DIAGNOSIS — R202 Paresthesia of skin: Secondary | ICD-10-CM

## 2019-11-02 NOTE — Telephone Encounter (Signed)
Called patient and left message since she has canceled her remaining PT appointments. Let her know I was checking on her and wondering if she feels ready to discharge or if she is planning to come back. Requested a call back at [phone].  [name]. Ilsa Iha, PT, DPT 11/02/19, 1:27 PM

## 2019-11-03 ENCOUNTER — Ambulatory Visit (INDEPENDENT_AMBULATORY_CARE_PROVIDER_SITE_OTHER): Payer: Medicaid Other | Admitting: Licensed Clinical Social Worker

## 2019-11-03 ENCOUNTER — Other Ambulatory Visit: Payer: Self-pay

## 2019-11-03 DIAGNOSIS — F4310 Post-traumatic stress disorder, unspecified: Secondary | ICD-10-CM

## 2019-11-03 DIAGNOSIS — F3161 Bipolar disorder, current episode mixed, mild: Secondary | ICD-10-CM

## 2019-11-03 DIAGNOSIS — F909 Attention-deficit hyperactivity disorder, unspecified type: Secondary | ICD-10-CM | POA: Diagnosis not present

## 2019-11-03 DIAGNOSIS — F603 Borderline personality disorder: Secondary | ICD-10-CM

## 2019-11-03 NOTE — Progress Notes (Signed)
Virtual Visit via Video Note   I connected with [name] on 11/03/19 at 11:00am by [facility] video application and verified that I am speaking with the correct person using two identifiers.   I discussed the limitations, risks, security and privacy concerns of performing an evaluation and management service by telephone and the availability of in person appointments. I also discussed with the patient that there may be a patient responsible charge related to this service. The patient expressed understanding and agreed to proceed.   I discussed the assessment and treatment plan with the patient. The patient was provided an opportunity to ask questions and all were answered. The patient agreed with the plan and demonstrated an understanding of the instructions.   The patient was advised to call back or seek an in-person evaluation if the symptoms worsen or if the condition fails to improve as anticipated.   I provided 1 hour of non-face-to-face time during this encounter.     [name], LCSW, LCAS ________________________ THERAPIST PROGRESS NOTE   Session Time: 11:00am - 12:00pm   Participation Level: Active   Behavioral Response: Alert, well groomed, casually dressed, euthymic mood/affect   Type of Therapy:  Individual Therapy   Treatment Goals addressed: Self-care routine; anxiety management; Medication management; Diet and exercise; Communication skills   Interventions: CBT, boundaries and aspects of healthy/unhealthy relationships    Summary: Dearia Wilmouth is a 29 year old female that presented for virtual session today via Webex and is diagnosed with Bipolar I Disorder, mixed, mild; Borderline personality disorder; PTSD; and ADHD, unspecified.        Suicidal/Homicidal: None; without intent or plan   Therapist Response:  Clinician met with [name] virtually via Webex today and assessed for safety, sobriety, and medication compliance.  Pavneet presented for session  on time and was alert, oriented x5, with no evidence or self-report of SI/HI or A/V H.  [name] reported that she has been compliant with current medication, but admitted to smoking marijuana on 4 occasions in past week when a friend came over. Clinician encouraged Evelynn to recall reasons why she began abstaining previously and to be mindful of negative impact this could have on motivation towards treatment goals. Clinician inquired about Tarren's emotional ratings today, as well as any significant changes in thoughts, feelings, or behavior since previous conversation.  Destony reported scores of 0/10 for both depression and anxiety today, which is consistent with previous check-in.  Wahneta reported that she has had a good past week, as a friend came into town to visit, and they spent time watching movies and playing games.  She denied having any recent panic attacks and reported that she has not made much progress in past week as she has been 'on vacation' from normal responsibilities and structure.  Gwenn reported that despite lack of structure and productivity towards goals, her mood has remained positive.  She reported that she does feel conflicted at this time due to opening up contact with a former partner while in a new relationship with someone else. Clinician revisited literature on traits of toxic relationships with [name] in session to increase awareness of any warning signs which she may overlook while reconnecting with this person.  Clinician also presented psychoeducation on aspects of healthy intimate relationships, such as maintaining honesty and accountability, open communication between partners, seeking negotiation and fairness in disputes, equal economic partnership, respect, and keeping appropriate emotional and physical boundaries in place between partners.  Marsia participated in discussion and became emotional  discussing how she may have entered into new relationship  too soon, and feels confused at this time.  She reported that she would be mindful of red flags due to familiarity with former partner and continue working to strengthen aspects of new relationship, as contact with this new person is reinforcing healthier behavior in her eyes. Clinician encouraged Lashann to avoid losing primary focus upon bettering herself at this time and will continue to monitor.       Plan: Meet again in 1 week virtually.   Diagnosis: Bipolar I Disorder, mixed, mild; Borderline personality disorder; PTSD; ADHD, unspecified    [name], LCSW, LCAS 11/03/19

## 2019-11-03 NOTE — Telephone Encounter (Signed)
Referral toPhysiatry

## 2019-11-04 ENCOUNTER — Ambulatory Visit: Payer: Medicaid Other | Attending: Internal Medicine

## 2019-11-04 DIAGNOSIS — Z23 Encounter for immunization: Secondary | ICD-10-CM

## 2019-11-04 NOTE — Progress Notes (Signed)
   Covid-19 Vaccination Clinic  Name:  Courtney [name]    MRN: [id number] DOB: 09-Nov-1990  11/04/2019  [name] was observed post Covid-19 immunization for 15 minutes without incident. She was provided with Vaccine Information Sheet and instruction to access the V-Safe system.   Ms. Both was instructed to call 911 with any severe reactions post vaccine: Marland Kitchen Difficulty breathing  . Swelling of face and throat  . A fast heartbeat  . A bad rash all over body  . Dizziness and weakness   Immunizations Administered    Name Date Dose VIS Date Route   Moderna COVID-19 Vaccine 11/04/2019  1:05 PM 0.5 mL 07/12/2019 Intramuscular   Manufacturer: Moderna   Lot: [id number]   NDC: 77939-688-64

## 2019-11-07 ENCOUNTER — Encounter: Payer: Self-pay | Admitting: Physical Therapy

## 2019-11-07 ENCOUNTER — Encounter: Payer: Medicaid Other | Admitting: Physical Therapy

## 2019-11-07 DIAGNOSIS — M79602 Pain in left arm: Secondary | ICD-10-CM

## 2019-11-07 DIAGNOSIS — R202 Paresthesia of skin: Secondary | ICD-10-CM

## 2019-11-07 DIAGNOSIS — M79601 Pain in right arm: Secondary | ICD-10-CM

## 2019-11-07 DIAGNOSIS — M79604 Pain in right leg: Secondary | ICD-10-CM

## 2019-11-07 DIAGNOSIS — M79605 Pain in left leg: Secondary | ICD-10-CM

## 2019-11-07 DIAGNOSIS — M545 Low back pain, unspecified: Secondary | ICD-10-CM

## 2019-11-07 DIAGNOSIS — G8929 Other chronic pain: Secondary | ICD-10-CM

## 2019-11-07 DIAGNOSIS — M542 Cervicalgia: Secondary | ICD-10-CM

## 2019-11-07 NOTE — Therapy (Signed)
[facility] [address], [place], [address] Phone: [phone]   Fax:  [phone]  Physical Therapy No-Visit Discharge Summary Reporting period: 09/01/2019 - 11/07/2019  Patient Details  Name: Courtney [name] MRN: [id number] Date of Birth: 1990/11/28 Referring Provider (PT): Gurney Maxin, MD   Encounter Date: 11/07/2019    Past Medical History:  Diagnosis Date  . ADHD    As a child  . Anxiety   . Bipolar 1 disorder ([place])   . Cervical kyphosis   . Depression   . Fatty liver   . Fibromyalgia   . OCD (obsessive compulsive disorder)   . Personality disorder ([place])   . PTSD (post-traumatic stress disorder)   . Pyloric stenosis     Past Surgical History:  Procedure Laterality Date  . APPENDECTOMY  02/17/2014  . COLONOSCOPY WITH PROPOFOL N/A 09/03/2018   Procedure: COLONOSCOPY WITH PROPOFOL;  Surgeon: Virgel Manifold, MD;  Location: [facility];  Service: Endoscopy;  Laterality: N/A;  . ESOPHAGOGASTRODUODENOSCOPY (EGD) WITH PROPOFOL N/A 09/03/2018   Procedure: ESOPHAGOGASTRODUODENOSCOPY (EGD) WITH PROPOFOL;  Surgeon: Virgel Manifold, MD;  Location: [facility] ENDOSCOPY;  Service: Endoscopy;  Laterality: N/A;  . EUS N/A 10/14/2018   Procedure: FULL UPPER ENDOSCOPIC ULTRASOUND (EUS) RADIAL;  Surgeon: Jola Schmidt, MD;  Location: [facility] ENDOSCOPY;  Service: Endoscopy;  Laterality: N/A;  . pyloric stenosis repair      There were no vitals filed for this visit.  Subjective Assessment - 11/07/19 1830    Subjective  Patient canceled appointments and stopped attending after her last visit 10/12/2019 and did not respond to attempts to contact her by phone. Her insturance authorization has expired, so she will now be discharged from physical therapy.    Pertinent History  Patient is a 29 y.o. female who presents to outpatient physical therapy with a referral for medical diagnosis numbness and tingling. This  patient's chief complaints consist of burning pain from the base of her neck down both arms, over her entire spine and down both legs with intermittent tingling in her hands and feet she also complains of joint pain especially at the R knee. This leading to the following functional deficits: difficulty working out, decreased quality of life, difficulty tolerating usual tasks due to distraction from pain, describes it causing feeling need to pinch or bite her wrists because she cannot stand the pain. Relevant past medical history and comorbidities include fibromyalgia, bipolar 1 disorder, memory loss/confusion, borderline personality disorder, OCD, PTSD, fatty liver, pyloric stenosis, seizures as a child, ADHD, severe anxiety, pre-diabetes, depression, history of concussion within the last 6 months, former smoker. Currently sees mental health counselor as well as psychiatrist. States she feels she has adequate mental health support at this time. Denies stroke, lung problems, cardiac problems (except occasional chest pain she associates with anxiety), cancer. MS was already ruled out.    Limitations  Other (comment);House hold activities   difficulty working out, decreased quality of life, difficulty tolerating usual tasks due to distraction from pain, describes it causing feeling need to pinch or bite her wrists because she cannot stand the pain. makes everything harder   Diagnostic tests  Brain MRI report 10/15/2019: normal, no evidence of demyelinating disease. Lumbar spine radiograph report 07/26/19: "no acue abnormality in lumbar spine" shows L5 transitional verebral body, disc heights maintained. Radiograph report of cervical spine 07/26/2019: "kpyosis in the cervical spien with areas of mild disc space narrowing at c5-6. There appears to be some  posterior disc space narrowing at C7-T1.    Patient Stated Goals  to feel better and alleviate the pain and learn some exercises to do at home.    Pain Onset  --    29 years old     OBJECTIVE Patient is not present for examination at this time. Please see previous documentation for latest objective data.      PT Short Term Goals - 09/15/19 1133      PT SHORT TERM GOAL #1   Title  Be independent with initial home exercise program for self-management of symptoms.    Baseline  Initial HEP to be provided 2nd visit (09/01/2019); provided and participating consistently (09/15/2019);    Time  3    Period  Weeks    Status  Achieved    Target Date  09/22/19        PT Long Term Goals - 11/07/19 1834      PT LONG TERM GOAL #1   Title  Be independent with a long-term home exercise program for self-management of symptoms.    Baseline  Initial HEP to be provided at 2nd visit (09/01/2019); successfully participating in HEP appropriate for stage of recovery, has not yet received final long term HEP (09/15/2019); pt was succesfully participating 10/12/2019.    Time  10    Period  Weeks    Status  Partially Met    Target Date  11/10/19      PT LONG TERM GOAL #2   Title  Patient will demonstrate improved ability to perform functional tasks as exhibited by at least 10 point improvement in FOTO score    Baseline  deferred to 2nd visit (09/01/2019); FOTO  = 47 (09/08/2019); pt self-discharged unexpectedly (11/07/2019);    Time  10    Period  Weeks    Status  Unable to assess    Target Date  11/10/19      PT LONG TERM GOAL #3   Title  Have full cervical spine and lumbar AROM with no compensations or increase in pain in all planes except intermittent end range discomfort to allow patient to complete valued activities with less difficulty.    Baseline  see objective exam - painful and limited (09/01/2019); measurement deferred due to proximity to initial eval (09/15/2019); pt discharged unexpectedly (11/07/2019);    Time  10    Period  Weeks    Status  Unable to assess    Target Date  11/10/19      PT LONG TERM GOAL #4   Title  Complete community, work and/or  recreational activities without limitation due to current condition.    Baseline  difficulty working out, decreased quality of life, difficulty tolerating usual tasks due to distraction from pain, describes it causing feeling need to pinch or bite her wrists because she cannot stand the pain. (09/01/2019); continues to report similar limitations, but also decreased urge to bite/pinch wrists to cope with pain (one time since starting PT) (09/15/2019); pt was reporting improvements on 10/12/2019 then discharged unexpectedly (11/07/2019);    Time  10    Period  Weeks    Status  Partially Met    Target Date  11/10/19      PT LONG TERM GOAL #5   Title  Reduce pain with functional activities to equal or less than 2/10 to allow patient to complete usual activities including ADLs, IADLs, and social engagement with less difficulty.    Baseline  8/10 (09/01/2019); 7/10 (09/15/2019); pt  was reporting improvements by 10/12/2019 at last session    Time  10    Period  Weeks    Status  Partially Met    Target Date  11/10/19        Plan - 11/07/19 1838    Clinical Impression Statement  Patient attended 5 physical therapy sessions, then self-discharged without explanation and did not respond to attempts to contact her by phone. At her last session 10/12/2019 pt was participating well in her HEP and showing and reporting improvements in functional mobility and progress towards her goals. Her insurance authorization is now expired and she is now discharged from physical therapy for lack of continued participation.    Personal Factors and Comorbidities  Behavior Pattern;Comorbidity 3+;Education;Social Background;Fitness;Time since onset of injury/illness/exacerbation;Age    Comorbidities  Relevant past medical history and comorbidities include fibromyalgia, bipolar 1 disorder, memory loss/confusion, borderline personality disorder, OCD, PTSD, fatty liver, pyloric stenosis, seizures as a child, ADHD, severe anxiety,  pre-diabetes, depression, history of concussion within the last 6 months, former smoker.    Examination-Activity Limitations  Other   functional deficits: difficulty working out, decreased quality of life, difficulty tolerating usual tasks due to distraction from pain, describes it causing feeling need to pinch or bite her wrists because she cannot stand the pain.   Examination-Participation Restrictions  Community Activity;Interpersonal Relationship;Other   affects all activities when pain is too high to participate comfortably   Stability/Clinical Decision Making  Evolving/Moderate complexity    Rehab Potential  Fair    PT Frequency  2x / week    PT Duration  Other (comment)   10 weeks   PT Treatment/Interventions  ADLs/Self Care Home Management;Aquatic Therapy;Biofeedback;Cryotherapy;Moist Heat;Traction;Electrical Stimulation;Therapeutic activities;Therapeutic exercise;Balance training;Neuromuscular re-education;Patient/family education;Cognitive remediation;Manual techniques;Passive range of motion;Dry needling;Spinal Manipulations;Joint Manipulations    PT Next Visit Plan  pt is now discharged from physical therapy for lack of continued participation.    PT Home Exercise Plan  Medbridge Access Code: [email or web address]    Consulted and Agree with Plan of Care  Patient       Patient will benefit from skilled therapeutic intervention in order to improve the following deficits and impairments:  Impaired sensation, Improper body mechanics, Pain, Postural dysfunction, Increased muscle spasms, Decreased activity tolerance, Decreased endurance, Decreased range of motion, Decreased strength, Impaired perceived functional ability, Impaired UE functional use, Obesity, Impaired flexibility, Decreased balance, Difficulty walking  Visit Diagnosis: Cervicalgia  Chronic bilateral low back pain, unspecified whether sciatica present  Pain in both upper extremities  Pain in both lower extremities  Paresthesia of  skin     Problem List Patient Active Problem List   Diagnosis Date Noted  . Episodic confusion 09/14/2019  . Fibromyalgia syndrome 09/14/2019  . Menstrual cramps 09/12/2019  . GERD (gastroesophageal reflux disease) 09/12/2019  . 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 Fatty liver   . Decreased libido 07/19/2019  . Vaginal dryness 07/19/2019  . Labial hypertrophy 07/19/2019  . Memory loss or impairment 05/04/2019  . Numbness and tingling of both feet 04/28/2019  . Bilateral hand numbness 04/28/2019  . Borderline personality disorder ([place]) 04/08/2019  . Bipolar 1 disorder, mixed, mild ([place]) 03/21/2019  . Tobacco use disorder 03/21/2019  . Bipolar disorder ([place]) 11/21/2018  . Cannabis use disorder, mild, abuse   . B12 deficiency 10/11/2018  . Vitamin D deficiency 10/11/2018  . 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 Rectal polyp   . Severe anxiety 03/19/2018  . Chronic post-traumatic stress disorder (PTSD)   .  OCD (obsessive compulsive disorder) 12/31/2017  . Attention deficit hyperactivity disorder (ADHD), combined type 12/31/2017  . Prediabetes 12/31/2017    Everlean Alstrom. Graylon Good, PT, DPT 11/07/19, 6:39 PM  [facility] [address] [place], [address] Phone: [phone]   Fax:  [phone]  Name: Icelyn Navarrete MRN: [id number] Date of Birth: 1991-06-29

## 2019-11-07 NOTE — Patient Instructions (Signed)
Courtney [name]  11/07/2019     @PREFPERIOPPHARMACY @   Your procedure is scheduled on  11/15/2019 .  Report to 01/15/2020 at  [id number]  A.M.  Call this number if you have problems the morning of surgery:  [phone]   Remember:  Do not eat or drink after midnight.                          Take these medicines the morning of surgery with A SIP OF WATER  Vraylar, clonazepam, cymbalta, omeprazole, trileptal.    Do not wear jewelry, make-up or nail polish.  Do not wear lotions, powders, or perfumes. Please wear deodorant and brush your teeth.  Do not shave 48 hours prior to surgery.  Men may shave face and neck.  Do not bring valuables to the hospital.  [facility] is not responsible for any belongings or valuables.  Contacts, dentures or bridgework may not be worn into surgery.  Leave your suitcase in the car.  After surgery it may be brought to your room.  For patients admitted to the hospital, discharge time will be determined by your treatment team.  Patients discharged the day of surgery will not be allowed to drive home.   Name and phone number of your driver:   family Special instructions:  DO NOT smoke the morning of your procedure.  Please read over the following fact sheets that you were given. Anesthesia Post-op Instructions and Care and Recovery After Surgery       Labiaplasty, Care After This sheet gives you information about how to care for yourself after your procedure. Your health care provider may also give you more specific instructions. If you have problems or questions, contact your health care provider. What can I expect after the procedure? After the procedure, it is common to have:  Slight bleeding or fluid coming from your incision.  Mild pain.  Swelling. Follow these instructions at home: Incision care   Follow instructions from your health care provider about how to take care of your incision.  Leave stitches (sutures), skin  glue, or adhesive strips in place. These skin closures may need to stay in place for 2 weeks or longer. If adhesive strip edges start to loosen and curl up, you may trim the loose edges. Do not remove adhesive strips completely unless your health care provider tells you to do that.  Check your incision area every day for signs of infection. Check for: ? Redness, swelling, or more pain. ? More fluid or blood. ? Warmth. ? Vaginal discharge or a bad smell. Medicines  Apply antibiotic ointment as told by your health care provider.  Take over-the-counter and prescription medicines only as told by your health care provider. Driving  Do not drive or use heavy machinery while taking prescription pain medicine.  Do not drive for 24 hours if you were given a medicine to help you relax (sedative) during your procedure. Activity  For 4 weeks, or as long as directed: ? Do not place anything in your vagina, including tampons. ? Do not have vaginal sex. ? Do not wear tight clothing, especially tight underwear.  Return to your normal activities as told by your health care provider. Ask your health care provider what activities are safe for you. Bathing  Do not take baths, swim, or use a hot tub until your health care provider approves. Ask your health care provider  if you may take showers. You may only be allowed to take sponge baths.  Use mild soap and water to clean your incision area as needed. Managing pain and swelling  To reduce pain or swelling, put ice on the affected area: ? Put ice in a plastic bag. ? Place the bag between your dressing and your underwear. Make sure your underwear holds the ice bag in place. ? Leave the ice on for 20 minutes, 2-3 times a day.  When lying down, put a pillow under your hips. This will help to reduce swelling. General instructions  Do not use any products that contain nicotine or tobacco, such as cigarettes and e-cigarettes. These can delay healing and  increase the risk that your incision will open. If you need help quitting, ask your health care provider.  To prevent or treat constipation while you are taking prescription pain medicine, your health care provider may recommend that you: ? Drink enough fluid to keep your urine pale yellow. ? Take over-the-counter or prescription medicines. ? Eat foods that are high in fiber, such as fresh fruits and vegetables, whole grains, and beans. ? Limit foods that are high in fat and processed sugars, such as fried and sweet foods.  Keep all follow-up visits as told by your health care provider. This is important. Contact a health care provider if:  You have chills or a fever.  You have redness, swelling, or more pain around your incision.  You have more fluid or blood coming from your incision.  Your incision feels warm to the touch.  You have vaginal discharge or a bad smell coming from your incision area.  You have vaginal dryness or pain during sex after 4 weeks. Get help right away if:  You have bleeding that does not stop after 10 minutes of applying firm pressure. Summary  For the first few days after labiaplasty, you may have some mild pain and swelling.  For as long as directed by your health care provider, do not place anything in your vagina, have vaginal sex, or wear tight clothing. This information is not intended to replace advice given to you by your health care provider. Make sure you discuss any questions you have with your health care provider. Document Revised: 05/21/2018 Document Reviewed: 02/03/2017 Elsevier Patient Education  2020 [employer]  General Anesthesia, Adult, Care After This sheet gives you information about how to care for yourself after your procedure. Your health care provider may also give you more specific instructions. If you have problems or questions, contact your health care provider. What can I expect after the procedure? After the procedure,  the following side effects are common:  Pain or discomfort at the IV site.  Nausea.  Vomiting.  Sore throat.  Trouble concentrating.  Feeling cold or chills.  Weak or tired.  Sleepiness and fatigue.  Soreness and body aches. These side effects can affect parts of the body that were not involved in surgery. Follow these instructions at home:  For at least 24 hours after the procedure:  Have a responsible adult stay with you. It is important to have someone help care for you until you are awake and alert.  Rest as needed.  Do not: ? Participate in activities in which you could fall or become injured. ? Drive. ? Use heavy machinery. ? Drink alcohol. ? Take sleeping pills or medicines that cause drowsiness. ? Make important decisions or sign legal documents. ? Take care of children on your  own. Eating and drinking  Follow any instructions from your health care provider about eating or drinking restrictions.  When you feel hungry, start by eating small amounts of foods that are soft and easy to digest (bland), such as toast. Gradually return to your regular diet.  Drink enough fluid to keep your urine pale yellow.  If you vomit, rehydrate by drinking water, juice, or clear broth. General instructions  If you have sleep apnea, surgery and certain medicines can increase your risk for breathing problems. Follow instructions from your health care provider about wearing your sleep device: ? Anytime you are sleeping, including during daytime naps. ? While taking prescription pain medicines, sleeping medicines, or medicines that make you drowsy.  Return to your normal activities as told by your health care provider. Ask your health care provider what activities are safe for you.  Take over-the-counter and prescription medicines only as told by your health care provider.  If you smoke, do not smoke without supervision.  Keep all follow-up visits as told by your health care  provider. This is important. Contact a health care provider if:  You have nausea or vomiting that does not get better with medicine.  You cannot eat or drink without vomiting.  You have pain that does not get better with medicine.  You are unable to pass urine.  You develop a skin rash.  You have a fever.  You have redness around your IV site that gets worse. Get help right away if:  You have difficulty breathing.  You have chest pain.  You have blood in your urine or stool, or you vomit blood. Summary  After the procedure, it is common to have a sore throat or nausea. It is also common to feel tired.  Have a responsible adult stay with you for the first 24 hours after general anesthesia. It is important to have someone help care for you until you are awake and alert.  When you feel hungry, start by eating small amounts of foods that are soft and easy to digest (bland), such as toast. Gradually return to your regular diet.  Drink enough fluid to keep your urine pale yellow.  Return to your normal activities as told by your health care provider. Ask your health care provider what activities are safe for you. This information is not intended to replace advice given to you by your health care provider. Make sure you discuss any questions you have with your health care provider. Document Revised: 07/31/2017 Document Reviewed: 03/13/2017 Elsevier Patient Education  [place]. How to Use Chlorhexidine for Bathing Chlorhexidine gluconate (CHG) is a germ-killing (antiseptic) solution that is used to clean the skin. It can get rid of the bacteria that normally live on the skin and can keep them away for about 24 hours. To clean your skin with CHG, you may be given:  A CHG solution to use in the shower or as part of a sponge bath.  A prepackaged cloth that contains CHG. Cleaning your skin with CHG may help lower the risk for infection:  While you are staying in the  intensive care unit of the hospital.  If you have a vascular access, such as a central line, to provide short-term or long-term access to your veins.  If you have a catheter to drain urine from your bladder.  If you are on a ventilator. A ventilator is a machine that helps you breathe by moving air in and out of your lungs.  After surgery. What are the risks? Risks of using CHG include:  A skin reaction.  Hearing loss, if CHG gets in your ears.  Eye injury, if CHG gets in your eyes and is not rinsed out.  The CHG product catching fire. Make sure that you avoid smoking and flames after applying CHG to your skin. Do not use CHG:  If you have a chlorhexidine allergy or have previously reacted to chlorhexidine.  On babies younger than 71 months of age. How to use CHG solution  Use CHG only as told by your health care provider, and follow the instructions on the label.  Use the full amount of CHG as directed. Usually, this is one bottle. During a shower Follow these steps when using CHG solution during a shower (unless your health care provider gives you different instructions): 1. Start the shower. 2. Use your normal soap and shampoo to wash your face and hair. 3. Turn off the shower or move out of the shower stream. 4. Pour the CHG onto a clean washcloth. Do not use any type of brush or rough-edged sponge. 5. Starting at your neck, lather your body down to your toes. Make sure you follow these instructions: ? If you will be having surgery, pay special attention to the part of your body where you will be having surgery. Scrub this area for at least 1 minute. ? Do not use CHG on your head or face. If the solution gets into your ears or eyes, rinse them well with water. ? Avoid your genital area. ? Avoid any areas of skin that have broken skin, cuts, or scrapes. ? Scrub your back and under your arms. Make sure to wash skin folds. 6. Let the lather sit on your skin for 1-2 minutes  or as long as told by your health care provider. 7. Thoroughly rinse your entire body in the shower. Make sure that all body creases and crevices are rinsed well. 8. Dry off with a clean towel. Do not put any substances on your body afterward--such as powder, lotion, or perfume--unless you are told to do so by your health care provider. Only use lotions that are recommended by the manufacturer. 9. Put on clean clothes or pajamas. 10. If it is the night before your surgery, sleep in clean sheets.  During a sponge bath Follow these steps when using CHG solution during a sponge bath (unless your health care provider gives you different instructions): 1. Use your normal soap and shampoo to wash your face and hair. 2. Pour the CHG onto a clean washcloth. 3. Starting at your neck, lather your body down to your toes. Make sure you follow these instructions: ? If you will be having surgery, pay special attention to the part of your body where you will be having surgery. Scrub this area for at least 1 minute. ? Do not use CHG on your head or face. If the solution gets into your ears or eyes, rinse them well with water. ? Avoid your genital area. ? Avoid any areas of skin that have broken skin, cuts, or scrapes. ? Scrub your back and under your arms. Make sure to wash skin folds. 4. Let the lather sit on your skin for 1-2 minutes or as long as told by your health care provider. 5. Using a different clean, wet washcloth, thoroughly rinse your entire body. Make sure that all body creases and crevices are rinsed well. 6. Dry off with a clean towel. Do not  put any substances on your body afterward--such as powder, lotion, or perfume--unless you are told to do so by your health care provider. Only use lotions that are recommended by the manufacturer. 7. Put on clean clothes or pajamas. 8. If it is the night before your surgery, sleep in clean sheets. How to use CHG prepackaged cloths  Only use CHG cloths as  told by your health care provider, and follow the instructions on the label.  Use the CHG cloth on clean, dry skin.  Do not use the CHG cloth on your head or face unless your health care provider tells you to.  When washing with the CHG cloth: ? Avoid your genital area. ? Avoid any areas of skin that have broken skin, cuts, or scrapes. Before surgery Follow these steps when using a CHG cloth to clean before surgery (unless your health care provider gives you different instructions): 1. Using the CHG cloth, vigorously scrub the part of your body where you will be having surgery. Scrub using a back-and-forth motion for 3 minutes. The area on your body should be completely wet with CHG when you are done scrubbing. 2. Do not rinse. Discard the cloth and let the area air-dry. Do not put any substances on the area afterward, such as powder, lotion, or perfume. 3. Put on clean clothes or pajamas. 4. If it is the night before your surgery, sleep in clean sheets.  For general bathing Follow these steps when using CHG cloths for general bathing (unless your health care provider gives you different instructions). 1. Use a separate CHG cloth for each area of your body. Make sure you wash between any folds of skin and between your fingers and toes. Wash your body in the following order, switching to a new cloth after each step: ? The front of your neck, shoulders, and chest. ? Both of your arms, under your arms, and your hands. ? Your stomach and groin area, avoiding the genitals. ? Your right leg and foot. ? Your left leg and foot. ? The back of your neck, your back, and your buttocks. 2. Do not rinse. Discard the cloth and let the area air-dry. Do not put any substances on your body afterward--such as powder, lotion, or perfume--unless you are told to do so by your health care provider. Only use lotions that are recommended by the manufacturer. 3. Put on clean clothes or pajamas. Contact a health  care provider if:  Your skin gets irritated after scrubbing.  You have questions about using your solution or cloth. Get help right away if:  Your eyes become very red or swollen.  Your eyes itch badly.  Your skin itches badly and is red or swollen.  Your hearing changes.  You have trouble seeing.  You have swelling or tingling in your mouth or throat.  You have trouble breathing.  You swallow any chlorhexidine. Summary  Chlorhexidine gluconate (CHG) is a germ-killing (antiseptic) solution that is used to clean the skin. Cleaning your skin with CHG may help to lower your risk for infection.  You may be given CHG to use for bathing. It may be in a bottle or in a prepackaged cloth to use on your skin. Carefully follow your health care provider's instructions and the instructions on the product label.  Do not use CHG if you have a chlorhexidine allergy.  Contact your health care provider if your skin gets irritated after scrubbing. This information is not intended to replace advice given to  you by your health care provider. Make sure you discuss any questions you have with your health care provider. Document Revised: 10/14/2018 Document Reviewed: 06/25/2017 Elsevier Patient Education  [place].

## 2019-11-08 ENCOUNTER — Other Ambulatory Visit: Payer: Self-pay

## 2019-11-08 ENCOUNTER — Other Ambulatory Visit: Payer: Self-pay | Admitting: Obstetrics and Gynecology

## 2019-11-08 ENCOUNTER — Ambulatory Visit (INDEPENDENT_AMBULATORY_CARE_PROVIDER_SITE_OTHER): Payer: Medicaid Other | Admitting: Psychiatry

## 2019-11-08 DIAGNOSIS — F3161 Bipolar disorder, current episode mixed, mild: Secondary | ICD-10-CM

## 2019-11-08 DIAGNOSIS — F603 Borderline personality disorder: Secondary | ICD-10-CM | POA: Diagnosis not present

## 2019-11-08 DIAGNOSIS — F902 Attention-deficit hyperactivity disorder, combined type: Secondary | ICD-10-CM | POA: Diagnosis not present

## 2019-11-08 MED ORDER — METHYLPHENIDATE HCL ER (LA) 30 MG PO CP24: 30.0000 mg | ORAL_CAPSULE | ORAL | 0 refills | Status: DC

## 2019-11-08 MED ORDER — VRAYLAR 4.5 MG PO CAPS: 4.5000 mg | ORAL_CAPSULE | Freq: Every day | ORAL | 0 refills | Status: DC
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
## 2019-11-09 ENCOUNTER — Encounter (HOSPITAL_COMMUNITY): Payer: Self-pay

## 2019-11-09 ENCOUNTER — Encounter: Payer: Medicaid Other | Admitting: Physical Therapy

## 2019-11-09 ENCOUNTER — Encounter (HOSPITAL_COMMUNITY)
Admission: RE | Admit: 2019-11-09 | Discharge: 2019-11-09 | Disposition: A | Discharge status: Home / Self Care | Payer: Medicaid Other | Source: Ambulatory Visit | Attending: Obstetrics and Gynecology | Admitting: Obstetrics and Gynecology

## 2019-11-09 ENCOUNTER — Other Ambulatory Visit: Payer: Self-pay

## 2019-11-09 DIAGNOSIS — Z01812 Encounter for preprocedural laboratory examination: Secondary | ICD-10-CM | POA: Insufficient documentation

## 2019-11-09 DIAGNOSIS — Z01810 Encounter for preprocedural cardiovascular examination: Secondary | ICD-10-CM | POA: Diagnosis present

## 2019-11-09 HISTORY — DX: Simple febrile convulsions: R56.00

## 2019-11-09 HISTORY — DX: Nonrheumatic mitral (valve) prolapse: I34.1

## 2019-11-09 LAB — BASIC METABOLIC PANEL
Anion gap: 7 (ref 5–15)
BUN: 15 mg/dL (ref 6–20)
CO2: 29 mmol/L (ref 22–32)
Calcium: 8.7 mg/dL — ABNORMAL LOW (ref 8.9–10.3)
Chloride: 102 mmol/L (ref 98–111)
Creatinine, Ser: 0.63 mg/dL (ref 0.44–1.00)
GFR calc Af Amer: >60 mL/min (ref >60)
GFR calc non Af Amer: >60 mL/min (ref >60)
Glucose, Bld: 110 mg/dL — ABNORMAL HIGH (ref 70–99)
Potassium: 4.3 mmol/L (ref 3.5–5.1)
Sodium: 138 mmol/L (ref 135–145)

## 2019-11-09 LAB — CBC
HCT: 37.1 % (ref 36.0–46.0)
Hemoglobin: 11.4 g/dL — ABNORMAL LOW (ref 12.0–15.0)
MCH: 28.1 pg (ref 26.0–34.0)
MCHC: 30.7 g/dL (ref 30.0–36.0)
MCV: 91.4 fL (ref 80.0–100.0)
Platelets: 290 10*3/uL (ref 150–400)
RBC: 4.06 MIL/uL (ref 3.87–5.11)
RDW: 15.6 % — ABNORMAL HIGH (ref 11.5–15.5)
WBC: 5.6 10*3/uL (ref 4.0–10.5)
nRBC: 0 % (ref 0.0–0.2)

## 2019-11-09 LAB — HCG, SERUM, QUALITATIVE: Preg, Serum: NEGATIVE

## 2019-11-09 MED ORDER — NON FORMULARY: Freq: Once | Status: DC

## 2019-11-09 MED ORDER — PARAGARD INTRAUTERINE COPPER IU IUD: INTRAUTERINE_SYSTEM | INTRAUTERINE | Status: DC | PRN

## 2019-11-09 NOTE — Pre-Procedure Instructions (Signed)
In for PAT and patient states, "Dr Emelda Fear wanted me to tell you that I am requesting the Paraguard IUD and it will need to be ordered". He does have this on his consent but I was unfamiliar with ordering this. I spoke with D.Dallas, RN charge in OR and she states this is a new change that has just come about for IUD insertion in the OR. I called to confirm this with Dr Emelda Fear and also spoke with Gerre Pebbles in the pharmacy. Order was placed for this as well. I notified DDallas, RN and BMorris, RN to let them know about new procedure and that this had been ordered.

## 2019-11-14 ENCOUNTER — Other Ambulatory Visit: Payer: Self-pay

## 2019-11-14 ENCOUNTER — Other Ambulatory Visit (HOSPITAL_COMMUNITY)
Admission: RE | Admit: 2019-11-14 | Discharge: 2019-11-14 | Disposition: A | Discharge status: Home / Self Care | Payer: Medicaid Other | Source: Ambulatory Visit | Attending: Obstetrics and Gynecology | Admitting: Obstetrics and Gynecology

## 2019-11-14 DIAGNOSIS — Z20822 Contact with and (suspected) exposure to covid-19: Secondary | ICD-10-CM | POA: Diagnosis not present

## 2019-11-14 DIAGNOSIS — Z01812 Encounter for preprocedural laboratory examination: Secondary | ICD-10-CM | POA: Insufficient documentation

## 2019-11-14 LAB — SARS CORONAVIRUS 2 (TAT 6-24 HRS): SARS Coronavirus 2: NEGATIVE

## 2019-11-14 NOTE — H&P (Signed)
Courtney [name] is an 29 y.o. female. She is admitted for surgery consisting of   Bilateral reduction labioplasty, placement of IUD.  The patient states that the labia is uncomfortable both with and without clothing and sometimes it will get caught in clothing, which can be painful. It is also uncomfortable to have sexual intercourse. The patient has never had any children. She also reports decreased libido from taking three Plan B pills within a one month time period. Estradiol was checked on 09/12/2019 and was within normal limits. The patient does not use any form of birth control.  After discussion, she desires IUD placement, and asks that it be done as part of the surgery, while under anesthetic . Pertinent Gynecological History: Menses: flow is moderate Bleeding: LMP 3/22 Contraception: none DES exposure: unknown Blood transfusions: none Sexually transmitted diseases: no past history Previous GYN Procedures:   Last mammogram:  Date:  Last pap: normal  Date: 02/2019* OB History: G G0P0000  ,   Menstrual History: Menarche age:  Patient's last menstrual period was 10/31/2019.    Past Medical History:  Diagnosis Date  . ADHD    As a child  . Anxiety   . Bipolar 1 disorder ([place])   . Cervical kyphosis   . Depression   . Fatty liver   . Febrile seizure ([place])    as child, only one, was never put on meds.  . Fibromyalgia   . MVP (mitral valve prolapse)   . OCD (obsessive compulsive disorder)   . Personality disorder ([place])   . PTSD (post-traumatic stress disorder)   . Pyloric stenosis     Past Surgical History:  Procedure Laterality Date  . APPENDECTOMY  02/17/2014  . COLONOSCOPY WITH PROPOFOL N/A 09/03/2018   Procedure: COLONOSCOPY WITH PROPOFOL;  Surgeon: Virgel Manifold, MD;  Location: [facility];  Service: Endoscopy;  Laterality: N/A;  . ESOPHAGOGASTRODUODENOSCOPY (EGD) WITH PROPOFOL N/A 09/03/2018   Procedure: ESOPHAGOGASTRODUODENOSCOPY (EGD) WITH PROPOFOL;   Surgeon: Virgel Manifold, MD;  Location: [facility];  Service: Endoscopy;  Laterality: N/A;  . EUS N/A 10/14/2018   Procedure: FULL UPPER ENDOSCOPIC ULTRASOUND (EUS) RADIAL;  Surgeon: Jola Schmidt, MD;  Location: [facility];  Service: Endoscopy;  Laterality: N/A;  . pyloric stenosis repair      Family History  Problem Relation Age of Onset  . COPD Mother   . Hypertension Mother   . Asthma Mother   . Arthritis Mother   . Pancreatic cancer Mother        slow growing  . ADD / ADHD Mother   . Other Mother        Spinal Stenosis - currently in surgery today  . Bipolar disorder Mother   . Anxiety disorder Mother   . Depression Mother   . Parkinson's disease Mother   . GER disease Mother   . Asthma Brother   . Hernia Brother   . ADD / ADHD Brother   . Bipolar disorder Brother   . Hypertension Maternal Grandmother   . Heart disease Maternal Grandmother 28  . Rheumatic fever Maternal Grandmother   . Heart attack Maternal Grandmother        Bypass Surgery  . Pancreatic cancer Maternal Grandfather   . Liver cancer Maternal Grandfather   . Asthma Maternal Grandfather   . Obesity Paternal Grandmother     Social History:  reports that she has quit smoking. Her smoking use included cigarettes. She started smoking about 11 years ago. She quit after 0.50  years of use. She has never used smokeless tobacco. She reports previous alcohol use. She reports previous drug use. Drug: Marijuana.  Allergies:  Allergies  Allergen Reactions  . Vyvanse [Lisdexamfetamine] Other (See Comments)    Bounce off of the wall    No medications prior to admission.    Review of Systems ROS   +labia discomfort +decreased libido  All systems are negative except as noted in the HPI and PMH. pt underwent Covid immunization last week. Last menstrual period 10/31/2019. Physical Exam  Constitutional: She appears well-developed and well-nourished. No distress.  HENT:  Head: Normocephalic.   Eyes: Pupils are equal, round, and reactive to light. EOM are normal.  Cardiovascular: Normal rate.  Respiratory: Effort normal.  GI: Soft. Bowel sounds are normal. She exhibits no distension. There is no abdominal tenderness. There is no rebound.  Genitourinary:    Vagina normal.     Genitourinary Comments: PELVIC External genitalia - just lateral to the urethra on each side, she has a 3 cm by 1.5 cm area of the labia minora that is very symmetric on both sides and does not go all the way down the vaginal entrance but is very elongated.  Chaperoned by Nikki Dom.    Musculoskeletal:     Cervical back: Normal range of motion.    CBC    Component Value Date/Time   WBC 5.6 11/09/2019 0928   RBC 4.06 11/09/2019 0928   HGB 11.4 (L) 11/09/2019 0928   HCT 37.1 11/09/2019 0928   PLT 290 11/09/2019 0928   MCV 91.4 11/09/2019 0928   MCH 28.1 11/09/2019 0928   MCHC 30.7 11/09/2019 0928   RDW 15.6 (H) 11/09/2019 0928   LYMPHSABS 3.1 02/27/2018 0140   MONOABS 0.5 02/27/2018 0140   EOSABS 0.4 02/27/2018 0140   BASOSABS 0.0 02/27/2018 0140   CMP Latest Ref Rng & Units 11/09/2019 06/07/2019 11/20/2018  Glucose 70 - 99 mg/dL 740(C) 95 94  BUN 6 - 20 mg/dL 15 11 12   Creatinine 0.44 - 1.00 mg/dL 1.44 8.18  Sodium 135 - 145 mmol/L 138 138 141  Potassium 3.5 - 5.1 mmol/L 4.3 4.0 3.8  Chloride 98 - 111 mmol/L 102 104 99  CO2 22 - 32 mmol/L 29 26 27   Calcium 8.9 - 10.3 mg/dL 5.63) 9.9 9.8  Total Protein 6.5 - 8.1 g/dL - - 8.1  Total Bilirubin 0.3 - 1.2 mg/dL - - 0.7  Alkaline Phos 38 - 126 U/L - - 55  AST 15 - 41 U/L - - 22  ALT 0 - 44 U/L - - 20    HCG negative Covid testing " in Process  Assessment/Plan: Bilateral labial hypertrophy Desire for IUD placement for contraception  Plan: reduction of length of labia as discussed at earlier visit            IUD placement, Paraguard planned as pt desires to avoid hormone effects.           Risks of postsurgical healing changes,  need for postop removal of sutures, potential for IUD expulsion or migration, all discussed at office visits and subsequent conversations, questions encouraged and answered.  11/14/2019, 9:44 PM

## 2019-11-15 ENCOUNTER — Encounter (HOSPITAL_COMMUNITY): Payer: Self-pay | Admitting: Obstetrics and Gynecology

## 2019-11-15 ENCOUNTER — Encounter (HOSPITAL_COMMUNITY)
Admission: RE | Disposition: A | Discharge status: Home / Self Care | Payer: Self-pay | Source: Home / Self Care | Attending: Obstetrics and Gynecology

## 2019-11-15 ENCOUNTER — Ambulatory Visit (HOSPITAL_COMMUNITY)
Admission: RE | Admit: 2019-11-15 | Discharge: 2019-11-15 | Disposition: A | Discharge status: Home / Self Care | Payer: Medicaid Other | Attending: Obstetrics and Gynecology | Admitting: Obstetrics and Gynecology

## 2019-11-15 ENCOUNTER — Ambulatory Visit (HOSPITAL_COMMUNITY): Payer: Medicaid Other | Admitting: Anesthesiology

## 2019-11-15 DIAGNOSIS — Z888 Allergy status to other drugs, medicaments and biological substances status: Secondary | ICD-10-CM | POA: Diagnosis not present

## 2019-11-15 DIAGNOSIS — N9411 Superficial (introital) dyspareunia: Secondary | ICD-10-CM

## 2019-11-15 DIAGNOSIS — Z30430 Encounter for insertion of intrauterine contraceptive device: Secondary | ICD-10-CM | POA: Insufficient documentation

## 2019-11-15 DIAGNOSIS — N906 Unspecified hypertrophy of vulva: Secondary | ICD-10-CM | POA: Diagnosis present

## 2019-11-15 DIAGNOSIS — M797 Fibromyalgia: Secondary | ICD-10-CM | POA: Diagnosis not present

## 2019-11-15 DIAGNOSIS — F319 Bipolar disorder, unspecified: Secondary | ICD-10-CM | POA: Diagnosis not present

## 2019-11-15 DIAGNOSIS — Z87891 Personal history of nicotine dependence: Secondary | ICD-10-CM | POA: Insufficient documentation

## 2019-11-15 DIAGNOSIS — I341 Nonrheumatic mitral (valve) prolapse: Secondary | ICD-10-CM | POA: Insufficient documentation

## 2019-11-15 DIAGNOSIS — K760 Fatty (change of) liver, not elsewhere classified: Secondary | ICD-10-CM | POA: Insufficient documentation

## 2019-11-15 DIAGNOSIS — Z8249 Family history of ischemic heart disease and other diseases of the circulatory system: Secondary | ICD-10-CM | POA: Diagnosis not present

## 2019-11-15 HISTORY — PX: LABIOPLASTY: SHX1900

## 2019-11-15 HISTORY — PX: INTRAUTERINE DEVICE (IUD) INSERTION: SHX5877

## 2019-11-15 SURGERY — LABIAPLASTY, VULVA
Anesthesia: General

## 2019-11-15 MED ORDER — BACITRACIN-NEOMYCIN-POLYMYXIN 400-5-5000 EX OINT
TOPICAL_OINTMENT | CUTANEOUS | Status: AC
  Filled 2019-11-15: qty 2

## 2019-11-15 MED ORDER — SCOPOLAMINE 1 MG/3DAYS TD PT72
1.0000 | MEDICATED_PATCH | Freq: Once | TRANSDERMAL | Status: DC
  Administered 2019-11-15: 1.5 mg via TRANSDERMAL

## 2019-11-15 MED ORDER — ONDANSETRON HCL 4 MG/2ML IJ SOLN
INTRAMUSCULAR | Status: AC
  Filled 2019-11-15: qty 2

## 2019-11-15 MED ORDER — FENTANYL CITRATE (PF) 100 MCG/2ML IJ SOLN
INTRAMUSCULAR | Status: AC
  Filled 2019-11-15: qty 2

## 2019-11-15 MED ORDER — DEXAMETHASONE SODIUM PHOSPHATE 10 MG/ML IJ SOLN
INTRAMUSCULAR | Status: AC
  Filled 2019-11-15: qty 1

## 2019-11-15 MED ORDER — BUPIVACAINE-EPINEPHRINE (PF) 0.5% -1:200000 IJ SOLN
INTRAMUSCULAR | Status: AC
  Filled 2019-11-15: qty 30

## 2019-11-15 MED ORDER — LACTATED RINGERS IV SOLN: INTRAVENOUS | Status: DC | PRN

## 2019-11-15 MED ORDER — PROPOFOL 10 MG/ML IV BOLUS
INTRAVENOUS | Status: AC
  Filled 2019-11-15: qty 20

## 2019-11-15 MED ORDER — LACTATED RINGERS IV SOLN: Freq: Once | INTRAVENOUS | Status: AC

## 2019-11-15 MED ORDER — BUPIVACAINE HCL (PF) 0.5 % IJ SOLN
INTRAMUSCULAR | Status: DC | PRN
  Administered 2019-11-15: 7 mL

## 2019-11-15 MED ORDER — ONDANSETRON HCL 4 MG/2ML IJ SOLN
INTRAMUSCULAR | Status: DC | PRN
  Administered 2019-11-15: 4 mg via INTRAVENOUS

## 2019-11-15 MED ORDER — FENTANYL CITRATE (PF) 100 MCG/2ML IJ SOLN
INTRAMUSCULAR | Status: DC | PRN
  Administered 2019-11-15: 100 ug via INTRAVENOUS

## 2019-11-15 MED ORDER — DEXAMETHASONE SODIUM PHOSPHATE 4 MG/ML IJ SOLN
INTRAMUSCULAR | Status: DC | PRN
  Administered 2019-11-15: 6 mg via INTRAVENOUS

## 2019-11-15 MED ORDER — MIDAZOLAM HCL 2 MG/2ML IJ SOLN
INTRAMUSCULAR | Status: AC
  Filled 2019-11-15: qty 2

## 2019-11-15 MED ORDER — MIDAZOLAM HCL 5 MG/5ML IJ SOLN
INTRAMUSCULAR | Status: DC | PRN
  Administered 2019-11-15: 2 mg via INTRAVENOUS

## 2019-11-15 MED ORDER — HYDROCODONE-ACETAMINOPHEN 5-325 MG PO TABS: 1.0000 | ORAL_TABLET | Freq: Four times a day (QID) | ORAL | 0 refills | Status: DC | PRN

## 2019-11-15 MED ORDER — NEOSPORIN ORIGINAL EX OINT
TOPICAL_OINTMENT | CUTANEOUS | Status: DC | PRN
  Administered 2019-11-15: 1

## 2019-11-15 MED ORDER — 0.9 % SODIUM CHLORIDE (POUR BTL) OPTIME
TOPICAL | Status: DC | PRN
  Administered 2019-11-15: 1000 mL

## 2019-11-15 MED ORDER — HYDROMORPHONE HCL 1 MG/ML IJ SOLN: 0.2500 mg | INTRAMUSCULAR | Status: DC | PRN

## 2019-11-15 MED ORDER — PROPOFOL 10 MG/ML IV BOLUS
INTRAVENOUS | Status: DC | PRN
  Administered 2019-11-15: 200 mg via INTRAVENOUS

## 2019-11-15 MED ORDER — EPHEDRINE SULFATE 50 MG/ML IJ SOLN
INTRAMUSCULAR | Status: DC | PRN
  Administered 2019-11-15 (×4): 10 mg via INTRAVENOUS

## 2019-11-15 MED ORDER — SCOPOLAMINE 1 MG/3DAYS TD PT72
MEDICATED_PATCH | TRANSDERMAL | Status: AC
  Filled 2019-11-15: qty 1

## 2019-11-15 MED ORDER — MEPERIDINE HCL 50 MG/ML IJ SOLN: 6.2500 mg | INTRAMUSCULAR | Status: DC | PRN

## 2019-11-15 MED ORDER — PROMETHAZINE HCL 25 MG/ML IJ SOLN: 6.2500 mg | INTRAMUSCULAR | Status: DC | PRN

## 2019-11-15 SURGICAL SUPPLY — 33 items
BLADE SURG 15 STRL LF DISP TIS (BLADE) IMPLANT
BLADE SURG 15 STRL SS (BLADE) ×1
CATH ROBINSON RED A/P 16FR (CATHETERS) ×1 IMPLANT
CLOTH BEACON ORANGE TIMEOUT ST (SAFETY) ×3 IMPLANT
COVER LIGHT HANDLE STERIS (MISCELLANEOUS) ×6 IMPLANT
COVER WAND RF STERILE (DRAPES) ×3 IMPLANT
DECANTER SPIKE VIAL GLASS SM (MISCELLANEOUS) ×3 IMPLANT
DRAPE HALF SHEET 40X57 (DRAPES) ×3 IMPLANT
ELECT NDL TIP 2.8 STRL (NEEDLE) ×2 IMPLANT
ELECT NEEDLE TIP 2.8 STRL (NEEDLE) ×3 IMPLANT
ELECT REM PT RETURN 9FT ADLT (ELECTROSURGICAL) ×3
ELECTRODE REM PT RTRN 9FT ADLT (ELECTROSURGICAL) ×2 IMPLANT
GAUZE 4X4 16PLY RFD (DISPOSABLE) ×3 IMPLANT
GLOVE BIO SURGEON STRL SZ7 (GLOVE) ×1 IMPLANT
GLOVE BIOGEL PI IND STRL 7.0 (GLOVE) ×4 IMPLANT
GLOVE BIOGEL PI IND STRL 9 (GLOVE) ×2 IMPLANT
GLOVE BIOGEL PI INDICATOR 7.0 (GLOVE) ×2
GLOVE BIOGEL PI INDICATOR 9 (GLOVE) ×1
GLOVE ECLIPSE 9.0 STRL (GLOVE) ×3 IMPLANT
GOWN SPEC L3 XXLG W/TWL (GOWN DISPOSABLE) ×3 IMPLANT
GOWN STRL REUS W/TWL LRG LVL3 (GOWN DISPOSABLE) ×3 IMPLANT
KIT TURNOVER CYSTO (KITS) ×3 IMPLANT
MANIFOLD NEPTUNE II (INSTRUMENTS) ×3 IMPLANT
NDL HYPO 25X1 1.5 SAFETY (NEEDLE) ×2 IMPLANT
NEEDLE HYPO 25X1 1.5 SAFETY (NEEDLE) ×3 IMPLANT
NS IRRIG 1000ML POUR BTL (IV SOLUTION) ×3 IMPLANT
PACK PERI GYN (CUSTOM PROCEDURE TRAY) ×3 IMPLANT
PAD ARMBOARD 7.5X6 YLW CONV (MISCELLANEOUS) ×3 IMPLANT
PARAGARD IUD SINGLE (MISCELLANEOUS) ×1 IMPLANT
SET BASIN LINEN APH (SET/KITS/TRAYS/PACK) ×3 IMPLANT
SUT PROLENE 3 0 PS 2 (SUTURE) ×1 IMPLANT
SUT VIC AB 4-0 PS2 27 (SUTURE) ×3 IMPLANT
SYR CONTROL 10ML LL (SYRINGE) ×3 IMPLANT

## 2019-11-15 NOTE — Anesthesia Preprocedure Evaluation (Signed)
Anesthesia Evaluation  Patient identified by MRN, date of birth, ID band Patient awake    Reviewed: Allergy & Precautions, NPO status , Patient's Chart, lab work & pertinent test results  Airway Mallampati: II  TM Distance: >3 FB Neck ROM: Full    Dental  (+) Teeth Intact, Dental Advisory Given   Pulmonary former smoker,    Pulmonary exam normal breath sounds clear to auscultation       Cardiovascular Exercise Tolerance: Good Normal cardiovascular exam Rhythm:Regular Rate:Normal  Echo- 1. Left ventricular ejection fraction, by estimation, is 55 to 60%. The  left ventricle has normal function. The left ventricle has no regional  wall motion abnormalities. There is mild left ventricular hypertrophy.  Left ventricular diastolic parameters  were normal.  2. Right ventricular systolic function is normal. The right ventricular  size is normal. There is mildly elevated pulmonary artery systolic  pressure.  3. The mitral valve is normal in structure. Mild mitral valve  regurgitation.  4. The aortic valve is normal in structure. Aortic valve regurgitation is  not visualized.  Stress test - negative for ischemia.  EKG- NSR with sinus arrhythmia    Neuro/Psych Seizures -,  PSYCHIATRIC DISORDERS Anxiety Depression Bipolar Disorder PTSD Neuromuscular disease    GI/Hepatic GERD  Medicated and Controlled,(+)     substance abuse  marijuana use, Fatty liver   Endo/Other  negative endocrine ROS  Renal/GU negative Renal ROS     Musculoskeletal  (+) Fibromyalgia -  Abdominal   Peds  (+) ADHD Hematology negative hematology ROS (+)   Anesthesia Other Findings B12 dificiency  Reproductive/Obstetrics                           Anesthesia Physical Anesthesia Plan  ASA: II  Anesthesia Plan: General   Post-op Pain Management:    Induction: Intravenous  PONV Risk Score and Plan: 4 or greater and  Scopolamine patch - Pre-op, Ondansetron, Dexamethasone and Midazolam  Airway Management Planned: LMA  Additional Equipment:   Intra-op Plan:   Post-operative Plan: Extubation in OR  Informed Consent: I have reviewed the patients History and Physical, chart, labs and discussed the procedure including the risks, benefits and alternatives for the proposed anesthesia with the patient or authorized representative who has indicated his/her understanding and acceptance.     Dental advisory given  Plan Discussed with: CRNA  Anesthesia Plan Comments:        Anesthesia Quick Evaluation

## 2019-11-15 NOTE — Interval H&P Note (Signed)
History and Physical Interval Note:  11/15/2019 7:25 AM  Courtney [name]  has presented today for surgery, with the diagnosis of Vaginal discomfort and irritation; contraception.  The various methods of treatment have been discussed with the patient and family. After consideration of risks, benefits and other options for treatment, the patient has consented to  Procedure(s): BILATERAL REDUCTION LABIAPLASTY (Bilateral) INTRAUTERINE DEVICE (IUD) INSERTION (PARA GARD) (N/A) as a surgical intervention.  The patient's history has been reviewed, patient examined, no change in status, stable for surgery.  I have reviewed the patient's chart and labs.  Questions were answered to the patient's satisfaction.     Tilda Burrow

## 2019-11-15 NOTE — Brief Op Note (Signed)
11/15/2019  8:21 AM  PATIENT:  Courtney [name]  29 y.o. female  PRE-OPERATIVE DIAGNOSIS:  Vaginal discomfort and irritation due to labial hypertrophy contraception  POST-OPERATIVE DIAGNOSIS:  Vaginal discomfort and irritation due to labial hypertrophy; contraception  PROCEDURE:  Procedure(s): BILATERAL REDUCTION LABIAPLASTY (Bilateral) INTRAUTERINE DEVICE (IUD) INSERTION (PARA GARD) (N/A)  SURGEON:  Surgeon(s) and Role:    * Tilda Burrow, MD - Primary  PHYSICIAN ASSISTANT:none   ASSISTANTS: none   ANESTHESIA:   local and general  EBL:  5 mL   BLOOD ADMINISTERED:none  DRAINS: none   LOCAL MEDICATIONS USED:  MARCAINE    and Amount: 8 ml  SPECIMEN:  No Specimen  DISPOSITION OF SPECIMEN:  N/A  COUNTS:  YES  TOURNIQUET:  * No tourniquets in log *  DICTATION: .Dragon Dictation  PLAN OF CARE: Discharge to home after PACU  PATIENT DISPOSITION:  PACU - hemodynamically stable.   Delay start of Pharmacological VTE agent (>24hrs) due to surgical blood loss or risk of bleeding: not applicable Details of procedure: Patient was taken the operating room prepped and draped for vaginal procedure with legs in yellowfin support with timeout conducted and procedure confirmed by operative team.  Preoperative antibiotics were not indicated. Speculum was inserted cervix cleansed single-tooth tenaculum used to grasp the cervix and the uterus sounded in the anteflexed position to 9 cm.  The uterus was then dilated to 84 [place] allowing introduction of the ParaGard IUD inserter to the appropriate depth and then release of the ParaGard, removal of the plunger then the IUD inserter sleeve, as per protocol. Tenaculum and speculum were removed after trimming the string to 2 cm in length.  Labioplasty: The elongate upper aspects of the labia minora on each side were then marked with skin marker, then infiltrated with Marcaine with epinephrine proximal to the area to be removed.  After waiting 2  minutes, Metzenbaum scissors were used to trim the redundant tissue in an arcuate fashion to allow good skin edge approximation.  Needlepoint cautery was used lightly to address areas of venous oozing, then a continuous running 3-0 Prolene suture was placed approximating the skin edges, with good hemostasis.  There was minimal light oozing noted.  The same procedure was repeated on the left side after good tissue edge approximation on the right. Topical Neosporin was applied and patient recovery room in stable condition.

## 2019-11-15 NOTE — Anesthesia Postprocedure Evaluation (Signed)
Anesthesia Post Note  Patient: Courtney [name]  Procedure(s) Performed: BILATERAL REDUCTION LABIAPLASTY (Bilateral ) INTRAUTERINE DEVICE (IUD) INSERTION (PARA GARD) (N/A )  Patient location during evaluation: PACU Anesthesia Type: General Level of consciousness: awake and alert, oriented and patient cooperative Pain management: pain level controlled Vital Signs Assessment: post-procedure vital signs reviewed and stable Respiratory status: spontaneous breathing, respiratory function stable and nonlabored ventilation Cardiovascular status: stable Postop Assessment: no apparent nausea or vomiting Anesthetic complications: no     Last Vitals:  Vitals:   11/15/19 0656  BP: 106/68  Pulse: 84  Resp: (!) 21  Temp: 36.8 C  SpO2: 95%    Last Pain:  Vitals:   11/15/19 0656  TempSrc: Oral  PainSc: 0-No pain                 Averyana Pillars

## 2019-11-15 NOTE — Anesthesia Procedure Notes (Signed)
Procedure Name: LMA Insertion Date/Time: 11/15/2019 7:31 AM Performed by: Shanon Payor, CRNA Pre-anesthesia Checklist: Patient identified, Emergency Drugs available, Suction available, Patient being monitored and Timeout performed Patient Re-evaluated:Patient Re-evaluated prior to induction Oxygen Delivery Method: Circle system utilized Preoxygenation: Pre-oxygenation with 100% oxygen Induction Type: IV induction LMA: LMA inserted LMA Size: 4.0 Number of attempts: 1 Placement Confirmation: positive ETCO2 and breath sounds checked- equal and bilateral Tube secured with: Tape Dental Injury: Teeth and Oropharynx as per pre-operative assessment

## 2019-11-15 NOTE — Discharge Instructions (Signed)
Ice pack may be used for 20 minutes every 2 hours for discomfort Ibuprofen is the preferred medication.  A small number of Vicodin has been given for severe pain. Please apply topical Neosporin to the area twice a day       General Anesthesia, Adult, Care After This sheet gives you information about how to care for yourself after your procedure. Your health care provider may also give you more specific instructions. If you have problems or questions, contact your health care provider. What can I expect after the procedure? After the procedure, the following side effects are common:  Pain or discomfort at the IV site.  Nausea.  Vomiting.  Sore throat.  Trouble concentrating.  Feeling cold or chills.  Weak or tired.  Sleepiness and fatigue.  Soreness and body aches. These side effects can affect parts of the body that were not involved in surgery. Follow these instructions at home:  For at least 24 hours after the procedure:  Have a responsible adult stay with you. It is important to have someone help care for you until you are awake and alert.  Rest as needed.  Do not: ? Participate in activities in which you could fall or become injured. ? Drive. ? Use heavy machinery. ? Drink alcohol. ? Take sleeping pills or medicines that cause drowsiness. ? Make important decisions or sign legal documents. ? Take care of children on your own. Eating and drinking  Follow any instructions from your health care provider about eating or drinking restrictions.  When you feel hungry, start by eating small amounts of foods that are soft and easy to digest (bland), such as toast. Gradually return to your regular diet.  Drink enough fluid to keep your urine pale yellow.  If you vomit, rehydrate by drinking water, juice, or clear broth. General instructions  If you have sleep apnea, surgery and certain medicines can increase your risk for breathing problems. Follow instructions  from your health care provider about wearing your sleep device: ? Anytime you are sleeping, including during daytime naps. ? While taking prescription pain medicines, sleeping medicines, or medicines that make you drowsy.  Return to your normal activities as told by your health care provider. Ask your health care provider what activities are safe for you.  Take over-the-counter and prescription medicines only as told by your health care provider.  If you smoke, do not smoke without supervision.  Keep all follow-up visits as told by your health care provider. This is important. Contact a health care provider if:  You have nausea or vomiting that does not get better with medicine.  You cannot eat or drink without vomiting.  You have pain that does not get better with medicine.  You are unable to pass urine.  You develop a skin rash.  You have a fever.  You have redness around your IV site that gets worse. Get help right away if:  You have difficulty breathing.  You have chest pain.  You have blood in your urine or stool, or you vomit blood. Summary  After the procedure, it is common to have a sore throat or nausea. It is also common to feel tired.  Have a responsible adult stay with you for the first 24 hours after general anesthesia. It is important to have someone help care for you until you are awake and alert.  When you feel hungry, start by eating small amounts of foods that are soft and easy to digest (bland), such  as toast. Gradually return to your regular diet.  Drink enough fluid to keep your urine pale yellow.  Return to your normal activities as told by your health care provider. Ask your health care provider what activities are safe for you. This information is not intended to replace advice given to you by your health care provider. Make sure you discuss any questions you have with your health care provider. Document Revised: 07/31/2017 Document Reviewed:  03/13/2017 Elsevier Patient Education  [place].

## 2019-11-15 NOTE — Op Note (Signed)
11/15/2019  8:21 AM  PATIENT:  Courtney [name]  29 y.o. female  PRE-OPERATIVE DIAGNOSIS:  Vaginal discomfort and irritation due to labial hypertrophy contraception  POST-OPERATIVE DIAGNOSIS:  Vaginal discomfort and irritation due to labial hypertrophy; contraception  PROCEDURE:  Procedure(s): BILATERAL REDUCTION LABIAPLASTY (Bilateral) INTRAUTERINE DEVICE (IUD) INSERTION (PARA GARD) (N/A)  SURGEON:  Surgeon(s) and Role:    * Tra Wilemon V, MD - Primary  PHYSICIAN ASSISTANT:none   ASSISTANTS: none   ANESTHESIA:   local and general  EBL:  5 mL   BLOOD ADMINISTERED:none  DRAINS: none   LOCAL MEDICATIONS USED:  MARCAINE    and Amount: 8 ml  SPECIMEN:  No Specimen  DISPOSITION OF SPECIMEN:  N/A  COUNTS:  YES  TOURNIQUET:  * No tourniquets in log *  DICTATION: .Dragon Dictation  PLAN OF CARE: Discharge to home after PACU  PATIENT DISPOSITION:  PACU - hemodynamically stable.   Delay start of Pharmacological VTE agent (>24hrs) due to surgical blood loss or risk of bleeding: not applicable Details of procedure: Patient was taken the operating room prepped and draped for vaginal procedure with legs in yellowfin support with timeout conducted and procedure confirmed by operative team.  Preoperative antibiotics were not indicated. Speculum was inserted cervix cleansed single-tooth tenaculum used to grasp the cervix and the uterus sounded in the anteflexed position to 9 cm.  The uterus was then dilated to 17 [place] allowing introduction of the ParaGard IUD inserter to the appropriate depth and then release of the ParaGard, removal of the plunger then the IUD inserter sleeve, as per protocol. Tenaculum and speculum were removed after trimming the string to 2 cm in length.  Labioplasty: The elongate upper aspects of the labia minora on each side were then marked with skin marker, then infiltrated with Marcaine with epinephrine proximal to the area to be removed.  After waiting 2  minutes, Metzenbaum scissors were used to trim the redundant tissue in an arcuate fashion to allow good skin edge approximation.  Needlepoint cautery was used lightly to address areas of venous oozing, then a continuous running 3-0 Prolene suture was placed approximating the skin edges, with good hemostasis.  There was minimal light oozing noted.  The same procedure was repeated on the left side after good tissue edge approximation on the right. Topical Neosporin was applied and patient recovery room in stable condition. 

## 2019-11-15 NOTE — Transfer of Care (Signed)
Immediate Anesthesia Transfer of Care Note  Patient: Courtney [name]  Procedure(s) Performed: BILATERAL REDUCTION LABIAPLASTY (Bilateral ) INTRAUTERINE DEVICE (IUD) INSERTION (PARA GARD) (N/A )  Patient Location: PACU  Anesthesia Type:General  Level of Consciousness: awake, alert , oriented and patient cooperative  Airway & Oxygen Therapy: Patient Spontanous Breathing and Patient connected to face mask oxygen  Post-op Assessment: Report given to RN and Post -op Vital signs reviewed and stable  Post vital signs: Reviewed and stable  Last Vitals:  Vitals Value Taken Time  BP    Temp    Pulse    Resp    SpO2      Last Pain:  Vitals:   11/15/19 0656  TempSrc: Oral  PainSc: 0-No pain         Complications: No apparent anesthesia complications

## 2019-11-17 ENCOUNTER — Ambulatory Visit: Payer: Medicaid Other | Admitting: Orthopaedic Surgery

## 2019-11-17 ENCOUNTER — Ambulatory Visit (INDEPENDENT_AMBULATORY_CARE_PROVIDER_SITE_OTHER): Payer: Medicaid Other | Admitting: Licensed Clinical Social Worker

## 2019-11-17 ENCOUNTER — Other Ambulatory Visit: Payer: Self-pay

## 2019-11-17 DIAGNOSIS — F4310 Post-traumatic stress disorder, unspecified: Secondary | ICD-10-CM

## 2019-11-17 DIAGNOSIS — F909 Attention-deficit hyperactivity disorder, unspecified type: Secondary | ICD-10-CM

## 2019-11-17 DIAGNOSIS — F3161 Bipolar disorder, current episode mixed, mild: Secondary | ICD-10-CM | POA: Diagnosis not present

## 2019-11-17 DIAGNOSIS — F603 Borderline personality disorder: Secondary | ICD-10-CM

## 2019-11-17 NOTE — Progress Notes (Signed)
Virtual Visit via Video Note  I connected withChristine [name] Webex video applicationand verified that I am speaking with the correct person using two identifiers.  I discussed the limitations, risks, security and privacy concerns of performing an evaluation and management service by telephone and the availability of in person appointments. I also discussed with the patient that there may be a patient responsible charge related to this service. The patient expressed understanding and agreed to proceed.  I discussed the assessment and treatment plan with the patient. The patient was provided an opportunity to ask questions and all were answered. The patient agreed with the plan and demonstrated an understanding of the instructions.  The patient was advised to call back or seek an in-person evaluation if the symptoms worsen or if the condition fails to improve as anticipated.  I provided30 minutes of non-face-to-face time during this encounter.   [name], LCSW, LCAS ________________________ THERAPIST PROGRESS NOTE  Session Time:11:00am -11:30am  Participation Level:Active  Behavioral Response:Alert, well groomed, casually dressed,euthymicmood/affect  Type of Therapy: Individual Therapy  Treatment Goals addressed:Self-care routine; anxiety management;Medication management; Diet and exercise; Communication skills  Interventions:CBT  Summary:[name] is a 29 year old female thatpresented for virtual session today via Webex and is diagnosed withBipolar I Disorder, mixed, mild; Borderline personality disorder; PTSD;andADHD, unspecified.  Suicidal/Homicidal:None;without intent or plan  Therapist Response:Clinician met with Courtney [name] via [employer] today.  Clinician assessed for safety, sobriety, and medication compliance. Courtney [name] presented for appointment on time and was alert, oriented x5,  with no evidence or self-report of SI/HI or A/V H. Courtney [name] reported that she has not smoked marijuana since last session, but admitted to being off her mood stabilizer for 4 days.  Clinician inquired about whether this had any impact on Courtney [name]'s thoughts, feelings, or behavior, and what her emotional ratings were like today.  Courtney [name] reported 0/10 for depression and 5/10 for anxiety, noting that she felt okay today overall despite being off medication, but did note increased irritability yesterday when she something triggered her during conversation with ex-boyfriend, whom she has reconnected with.  Courtney [name] reported that this made her realize that her mania had increased, since she felt more irritable, experienced pressure in her chest, and started having racing thoughts.  Courtney [name] reported that she coped with this by using deep breathing and reciting soothing messages to herself until the feelings subsided.  Clinician praised Courtney [name] for effective utilization of techniques and encouraged her to begin taking medication again immediately to avoid further destabilization following period of improved mood and consistency.  Clinician also revisited treatment plan with Courtney [name] to identify areas of progress and present barriers she may be facing.  Courtney [name] reported that her exercise routine is "Not going well" due to distractibility and low motivation in past week, but she plans to address this by discussing a realistic regimen with boyfriend.  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  Courtney [name] reported that her schedule is very busy today, so she wouldn't be able to attend full session.  Clinician collaborated with Courtney [name] to develop plan for the next few days which could make effective use of increased energy as she restarts  medication while managing manic symptoms.  Courtney [name] reported that she would assist her grandma with daily tasks, gather and complete laundry, meditate daily, take dogs out for exercise, and take self-care time  to rest when needed, while also exploring poetry and music as emotional outlet.  Clinician was supportive of plan and also encouraged Courtney [name] to be mindful of her partner's behavior as they reconnect, given past history of impacting her mental health negatively. Clinician will continue to monitor.   Plan:Meet again in 1 week virtually.   Diagnosis:Bipolar I Disorder, mixed, mild; Borderline personality disorder; PTSD; ADHD, unspecified  [name], LCSW, LCAS 11/17/19

## 2019-11-22 ENCOUNTER — Ambulatory Visit: Payer: Medicaid Other | Admitting: Orthopaedic Surgery

## 2019-11-22 ENCOUNTER — Encounter: Payer: Self-pay | Admitting: Orthopaedic Surgery

## 2019-11-22 ENCOUNTER — Other Ambulatory Visit: Payer: Self-pay

## 2019-11-22 VITALS — Ht 59.0 in | Wt 184.0 lb

## 2019-11-22 DIAGNOSIS — M25561 Pain in right knee: Secondary | ICD-10-CM

## 2019-11-22 DIAGNOSIS — G8929 Other chronic pain: Secondary | ICD-10-CM | POA: Diagnosis not present

## 2019-11-22 DIAGNOSIS — M797 Fibromyalgia: Secondary | ICD-10-CM

## 2019-11-22 NOTE — Progress Notes (Signed)
PROCEDURE NOTE:  The patient requests injections of the right knee , verbal consent was obtained.  The right knee was prepped appropriately after time out was performed.   Sterile technique was observed and injection of 1 cc of Depo-Medrol 40 mg with several cc's of plain xylocaine. Anesthesia was provided by ethyl chloride and a 20-gauge needle was used to inject the knee area. The injection was tolerated well.  A band aid dressing was applied.  The patient was advised to apply ice later today and tomorrow to the injection sight as needed.  Return in two weeks.  Call if any problem.  Precautions discussed.   Electronically Signed Darreld Mclean, MD 4/13/202110:50 AM

## 2019-11-24 ENCOUNTER — Ambulatory Visit (INDEPENDENT_AMBULATORY_CARE_PROVIDER_SITE_OTHER): Payer: Medicaid Other | Admitting: Licensed Clinical Social Worker

## 2019-11-24 ENCOUNTER — Other Ambulatory Visit: Payer: Self-pay

## 2019-11-24 DIAGNOSIS — F909 Attention-deficit hyperactivity disorder, unspecified type: Secondary | ICD-10-CM

## 2019-11-24 DIAGNOSIS — F4310 Post-traumatic stress disorder, unspecified: Secondary | ICD-10-CM | POA: Diagnosis not present

## 2019-11-24 DIAGNOSIS — F603 Borderline personality disorder: Secondary | ICD-10-CM

## 2019-11-24 DIAGNOSIS — F3161 Bipolar disorder, current episode mixed, mild: Secondary | ICD-10-CM

## 2019-11-24 NOTE — Progress Notes (Signed)
Virtual Visit via Video Note   I connected with Courtney [name] on 11/24/19 at 10:00am by [facility] video application and verified that I am speaking with the correct person using two identifiers.   I discussed the limitations, risks, security and privacy concerns of performing an evaluation and management service by telephone and the availability of in person appointments. I also discussed with the patient that there may be a patient responsible charge related to this service. The patient expressed understanding and agreed to proceed.   I discussed the assessment and treatment plan with the patient. The patient was provided an opportunity to ask questions and all were answered. The patient agreed with the plan and demonstrated an understanding of the instructions.   The patient was advised to call back or seek an in-person evaluation if the symptoms worsen or if the condition fails to improve as anticipated.   I provided 1 hour of non-face-to-face time during this encounter.     [name], LCSW, LCAS ________________________ THERAPIST PROGRESS NOTE   Session Time: 10:00am - 11:00am   Participation Level: Active   Behavioral Response: Alert, well groomed, casually dressed, anxious mood/affect   Type of Therapy:  Individual Therapy   Treatment Goals addressed: Self-care routine; anxiety management; Medication management; Diet and exercise; Communication skills   Interventions: CBT, treatment planning   Summary: Courtney [name] is a 29 year old female that presented for virtual session today via Webex and is diagnosed with Bipolar I Disorder, mixed, mild; Borderline personality disorder; PTSD; and ADHD, unspecified.        Suicidal/Homicidal: None; without intent or plan   Therapist Response:  Clinician met with Courtney [name] for virtual Webex session.  Clinician assessed for safety, sobriety, and medication compliance.  Courtney [name] presented for appointment on time and was alert,  oriented x5, with no evidence or self-report of SI/HI or A/V H.  Courtney [name] reported that she has been compliant with medication in past week, but admitted to smoking marijuana sporadically.  She stated "My week has been kinda hectic and I've felt a little more overwhelmed and stressed out".  She reported that she is disappointed in herself, but shows ambivalence towards quitting completely again.  Clinician encouraged her to continue weighing pros and cons of continued marijuana use, and highlighted recent period of productivity and stability following cessation of use.  Courtney [name] stated "I don't want to turn it into a crutch".  Clinician inquired about Courtney [name]'s emotional ratings today, as well as any significant changes in thoughts, feelings, or behavior since last session.  Courtney [name] reported scores of 4/10 for depression and 7/10 for anxiety, stating "Now that I'm back on my meds, my mania is better and I haven't had panic attacks".  Clinician inquired about progress made towards treatment goals in past week, as well as areas requiring focus today. Courtney [name] reported that she has been assigning daily homework to herself for mental health improvement in past week using self-help books, practicing relaxation techniques 2-3x weekly, using self-care time to make music and poetry as emotional outlet, and working to improve communication with her partner, stating "We are trying to learn to express feelings and not just react.  We have arguments less than we used to and its getting better".  She reported that she spent the weekend with him at an arcade and stated "It was amazing.  I was completely present, not worrying about anything, just relaxed and enjoying myself".  Clinician noted that 90 days have passed since treatment first began,  and recommended revisiting treatment plan today to make revision/update as needed.  Courtney [name] was agreeable to this, and collaborated with clinician to create updated plan as  follows: Meet with clinician virtually once per week for therapy to address ongoing progress and needs; Meet with psychiatrist once per month to address efficacy of medication and make adjustments as needed to regimen and/or dosage; Take medication daily as prescribed to reduce symptoms and improve overall daily functioning; Reduce depression from average severity of 2/10 down to 1/10 in the next 90 days by engaging in positive self-care activities for 2 hours each day, such as listening to music, watching movies, playing video games, doing makeup, cutting hair, and/or reading; Reduce anxiety from average severity of 2/10 down to 1/10 in next 90 days, as well as panic attacks from 1-2 per week down to 0 via utilization of 3-4 relaxation techniques daily such as mindful breathing, meditation, progressive muscle relaxation, grounding techniques, and/or positive visualizations; Work to become more independent in daily activities to increase sense of self-reliance, confidence, and curb dependency on other people; Identify 2-3 issues with communication skills that can be addressed to improve socialization with supports within next 90 days;  Write 1 page at a minimum in journal each day regarding thoughts, feelings, and behaviors that arise, with goal of better understanding mood changes for implementation of appropriate copings skills ahead of time; Exercise 3-4 times per week for an hour at a minimum to improve both physical and mental well-being; Continue working to establish zero balanced budget within next 60 days in an effort to save money each month, and curb impulsive spending behavior; Maintain part-time work schedule each week to improve financial stability and productivity while preserving balance in daily life to avoid becoming overwhelmed;  Monitor marijuana use closely to avoid developing dependence and negatively impacting mental health, motivation, and/or other goal progress; Voluntarily seek  hospitalization to behavioral health for safety should suicidal thoughts or behaviors reoccur.  Courtney [name] agreed to revisit treatment plan again in 90 days and noted "Overall I feel like this is helping a lot.  The medication and therapy has really made a difference in how I feel".  Clinician will continue to monitor.       Plan: Meet again in 1 week virtually.   Diagnosis: Bipolar I Disorder, mixed, mild; Borderline personality disorder; PTSD; ADHD, unspecified    [name], LCSW, LCAS 11/24/19

## 2019-11-28 ENCOUNTER — Encounter: Payer: Medicaid Other | Admitting: Obstetrics and Gynecology

## 2019-11-29 ENCOUNTER — Encounter: Payer: Self-pay | Admitting: Obstetrics and Gynecology

## 2019-11-29 ENCOUNTER — Other Ambulatory Visit: Payer: Self-pay

## 2019-11-29 ENCOUNTER — Ambulatory Visit (INDEPENDENT_AMBULATORY_CARE_PROVIDER_SITE_OTHER): Payer: Medicaid Other | Admitting: Obstetrics and Gynecology

## 2019-11-29 VITALS — BP 137/72 | HR 102 | Wt 184.0 lb

## 2019-11-29 DIAGNOSIS — Z4802 Encounter for removal of sutures: Secondary | ICD-10-CM | POA: Diagnosis not present

## 2019-11-29 NOTE — Progress Notes (Signed)
Patient ID: Courtney [name], female   DOB: 1990-09-02, 29 y.o.   MRN: [id number]     Subjective:  Courtney [name] is a 29 y.o. female now 2 weeks status post bilateral reduction labiaplasty and paragard insertion.   She notes that she only had to take one Vicodin for pain, and then she was fine afterwards.  She did have sexual intercourse while stitches were in. She notes that she is now less dry during sexual intercourse now than  Review of Systems Negative.   Diet:   Normal   Bowel movements : normal.    Objective:  LMP 10/31/2019  General:Well developed, well nourished.  No acute distress. Abdomen: Bowel sounds normal, soft, non-tender. Pelvic Exam:    External Genitalia:  Normal.    Vagina: Normal    Cervix: Normal. IUD in place.     Uterus: Normal    Adnexa/Bimanual: Normal  Incision(s): Healing well, no drainage, no erythema, no hernia, no swelling, no dehiscence    Assessment:  Post-Op 2 weeks s/p bilateral reduction labiaplasty and paragard insertion   Removed stitches postoperatively.   Plan:  1.Wound care discussed   2. . current medications. 3. Activity restrictions: none 4. return to work: now. 5. Follow up in prn prn .   By signing my name below, I, [employer], attest that this documentation has been prepared under the direction and in the presence of Tilda Burrow, MD. Electronically Signed: Mal Misty Medical Scribe. 11/29/19. 1:51 PM.  I personally performed the services described in this documentation, which was SCRIBED in my presence. The recorded information has been reviewed and considered accurate. It has been edited as necessary during review. Tilda Burrow, MD

## 2019-12-01 ENCOUNTER — Other Ambulatory Visit: Payer: Self-pay

## 2019-12-01 ENCOUNTER — Ambulatory Visit (INDEPENDENT_AMBULATORY_CARE_PROVIDER_SITE_OTHER): Payer: Medicaid Other | Admitting: Licensed Clinical Social Worker

## 2019-12-01 DIAGNOSIS — F603 Borderline personality disorder: Secondary | ICD-10-CM

## 2019-12-01 DIAGNOSIS — F4310 Post-traumatic stress disorder, unspecified: Secondary | ICD-10-CM | POA: Diagnosis not present

## 2019-12-01 DIAGNOSIS — F3161 Bipolar disorder, current episode mixed, mild: Secondary | ICD-10-CM | POA: Diagnosis not present

## 2019-12-01 DIAGNOSIS — F909 Attention-deficit hyperactivity disorder, unspecified type: Secondary | ICD-10-CM

## 2019-12-01 NOTE — Progress Notes (Signed)
Virtual Visit via Video Note   I connected with Courtney [name] on 12/01/19 at 11:00am by [facility] video application and verified that I am speaking with the correct person using two identifiers.   I discussed the limitations, risks, security and privacy concerns of performing an evaluation and management service by telephone and the availability of in person appointments. I also discussed with the patient that there may be Courtney patient responsible charge related to this service. The patient expressed understanding and agreed to proceed.   I discussed the assessment and treatment plan with the patient. The patient was provided an opportunity to ask questions and all were answered. The patient agreed with the plan and demonstrated an understanding of the instructions.   The patient was advised to call back or seek an in-person evaluation if the symptoms worsen or if the condition fails to improve as anticipated.   I provided 1 hour of non-face-to-face time during this encounter.     [name], LCSW, LCAS ________________________ THERAPIST PROGRESS NOTE   Session Time: 11:00am - 12:00pm   Participation Level: Active   Behavioral Response: Alert, well groomed, casually dressed, euthymic mood/affect   Type of Therapy:  Individual Therapy   Treatment Goals addressed: Self-care routine; anxiety management; Medication management; Diet and exercise; Communication skills   Interventions: CBT, communication skills    Summary: Courtney [name] is Courtney 29 year old female that presented for virtual session today via Webex and is diagnosed with Bipolar I Disorder, mixed, mild; Borderline personality disorder; PTSD; and ADHD, unspecified.        Suicidal/Homicidal: None; without intent or plan   Therapist Response:  Clinician met with Courtney [name] for [facility] appointment today.  Clinician assessed for safety, sobriety, and medication compliance.  Courtney [name] presented for appointment on time and was alert,  oriented x5, with no evidence or self-report of SI/HI or Courtney/V H.  Courtney [name] reported that she has been compliant with all medication in past week, with the exception of Cymbalta, which she forgot to bring with her over the weekend while staying with her boyfriend.  [place] reported that she noticed that she has been "Courtney little more manic, Courtney little more distracted" as Courtney result.  Courtney [name] reported that she continues smoking THC "Almost every day" but denied this having any noticeable impact on daily functioning for better or worse.  Clinician inquired about [name]'s emotional ratings at this time, as well as any significant changes in thoughts, feelings, or behavior since last session.  Courtney [name] reported scores of 0/10 for both depression and anxiety, and stated "I've been doing Courtney lot better.  Its Courtney good day and I'm optimistic".  Clinician revisited treatment goals with Courtney [name] to identify where progress has been made in recent weeks, as well as current challenges.  Courtney [name] reported that she has had 1-2 panic attacks in past week, but continues using relaxation and grounding techniques to intervene and successfully manage these when they occur.  She denied engaging in exercise routine and stated "I've been putting it off because I've been so busy".  She reported that she continues to work part-time, which has increased productivity and assisted with income.  She reported that she has been utilizing self-help book/journal at least once per day, which has offered insight into her goals, purpose in life, and tasks she can engage in to increase personal growth.  Courtney [name] reported that one issue that has arisen is ongoing communication issues with her boyfriend, which influenced the aforementioned panic attack.  Clinician  inquired about what specific issues appear to be causing problems between them, as well as strategies which could be implemented to improve boundaries and reduce overall tension.  Courtney [name]  reported that both parties are to blame, as he has trust issues and won't always respect her personal space, while she stated "I can be sarcastic, mean, and curse when I get overly emotional.  My nonverbal behavior isn't always great either".  Clinician was supportive of Courtney [name] taking measures to reinforce healthier boundaries due to past relationship concerns she had brought up, and inquired about how she can also work on her own behavior.  Courtney [name] stated "I need to think about my response before I react, and try to approach it in Courtney different way so that we won't argue as much".  Clinician was supportive of this suggestion and collaborated with Courtney [name] to develop action plan to get back on track with progress in following days.  Courtney [name] reported that she would continue working to improve communication skills, increase effort to avoid forgetting medication, try to workout and stretch x2 in upcoming week, limit time on social media in preference for "Activities that help me better myself", and try to increase overall structure in daily life.  Clinician will continue to monitor.       Plan: Meet again in 1 week virtually.   Diagnosis: Bipolar I Disorder, mixed, mild; Borderline personality disorder; PTSD; ADHD, unspecified    [name], [place], LCAS 12/01/19

## 2019-12-07 ENCOUNTER — Ambulatory Visit: Payer: Medicaid Other | Attending: Internal Medicine

## 2019-12-07 DIAGNOSIS — Z23 Encounter for immunization: Secondary | ICD-10-CM

## 2019-12-07 NOTE — Progress Notes (Signed)
   Covid-19 Vaccination Clinic  Name:  Courtney [name]    MRN: [id number] DOB: [birth year]  12/07/2019  Courtney [name] was observed post Covid-19 immunization for 15 minutes without incident. She was provided with Vaccine Information Sheet and instruction to access the V-Safe system.   Courtney [name] was instructed to call 911 with any severe reactions post vaccine: Marland Kitchen Difficulty breathing  . Swelling of face and throat  . A fast heartbeat  . A bad rash all over body  . Dizziness and weakness   Immunizations Administered    Name Date Dose VIS Date Route   Moderna COVID-19 Vaccine 12/07/2019 12:09 PM 0.5 mL 07/2019 Intramuscular   Manufacturer: Moderna   Lot: [id number]   NDC: 35670-141-03

## 2019-12-08 ENCOUNTER — Other Ambulatory Visit: Payer: Self-pay

## 2019-12-08 ENCOUNTER — Ambulatory Visit (INDEPENDENT_AMBULATORY_CARE_PROVIDER_SITE_OTHER): Payer: Medicaid Other | Admitting: Licensed Clinical Social Worker

## 2019-12-08 ENCOUNTER — Telehealth (INDEPENDENT_AMBULATORY_CARE_PROVIDER_SITE_OTHER): Payer: Medicaid Other | Admitting: Psychiatry

## 2019-12-08 DIAGNOSIS — F4312 Post-traumatic stress disorder, chronic: Secondary | ICD-10-CM | POA: Diagnosis not present

## 2019-12-08 DIAGNOSIS — F603 Borderline personality disorder: Secondary | ICD-10-CM | POA: Diagnosis not present

## 2019-12-08 DIAGNOSIS — F909 Attention-deficit hyperactivity disorder, unspecified type: Secondary | ICD-10-CM | POA: Diagnosis not present

## 2019-12-08 DIAGNOSIS — F902 Attention-deficit hyperactivity disorder, combined type: Secondary | ICD-10-CM | POA: Diagnosis not present

## 2019-12-08 DIAGNOSIS — F3161 Bipolar disorder, current episode mixed, mild: Secondary | ICD-10-CM | POA: Diagnosis not present

## 2019-12-08 DIAGNOSIS — F4310 Post-traumatic stress disorder, unspecified: Secondary | ICD-10-CM

## 2019-12-08 MED ORDER — METHYLPHENIDATE HCL ER (LA) 30 MG PO CP24
30.0000 mg | ORAL_CAPSULE | ORAL | 0 refills | Status: DC
Start: 2019-12-08 — End: 2020-01-24

## 2019-12-08 MED ORDER — METHYLPHENIDATE HCL ER (LA) 30 MG PO CP24
30.0000 mg | ORAL_CAPSULE | ORAL | 0 refills | Status: DC
Start: 2020-01-07 — End: 2020-01-24

## 2019-12-08 MED ORDER — OXCARBAZEPINE 300 MG PO TABS: 300.0000 mg | ORAL_TABLET | Freq: Two times a day (BID) | ORAL | 0 refills | Status: DC

## 2019-12-08 MED ORDER — METHYLPHENIDATE HCL ER (LA) 30 MG PO CP24
30.0000 mg | ORAL_CAPSULE | ORAL | 0 refills | Status: DC
Start: 2020-02-06 — End: 2020-01-24

## 2019-12-08 MED ORDER — CLONAZEPAM 1 MG PO TABS: 1.0000 mg | ORAL_TABLET | Freq: Two times a day (BID) | ORAL | 1 refills | Status: DC | PRN

## 2019-12-08 MED ORDER — DULOXETINE HCL 60 MG PO CPEP: 60.0000 mg | ORAL_CAPSULE | Freq: Every day | ORAL | 0 refills | Status: DC
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
## 2019-12-13 ENCOUNTER — Encounter: Payer: Self-pay | Admitting: Orthopaedic Surgery

## 2019-12-13 ENCOUNTER — Other Ambulatory Visit: Payer: Self-pay

## 2019-12-13 ENCOUNTER — Ambulatory Visit: Payer: Medicaid Other | Admitting: Orthopaedic Surgery

## 2019-12-13 VITALS — BP 145/74 | HR 90 | Ht 59.0 in | Wt 185.0 lb

## 2019-12-13 DIAGNOSIS — M25561 Pain in right knee: Secondary | ICD-10-CM | POA: Diagnosis not present

## 2019-12-13 DIAGNOSIS — G8929 Other chronic pain: Secondary | ICD-10-CM | POA: Diagnosis not present

## 2019-12-13 DIAGNOSIS — M797 Fibromyalgia: Secondary | ICD-10-CM

## 2019-12-13 NOTE — Progress Notes (Signed)
Patient Courtney [name], female DOB:09-13-1990, 29 y.o. GBT:[id number]  Chief Complaint  Patient presents with  . Knee Pain    right     HPI  Courtney [name] is a 29 y.o. female who has chronic right knee pain.  The injection did not help that much.  She has limping, giving way of the right knee.  She has some swelling.  She has medial joint line pain.  She has no new trauma.   Body mass index is 37.37 kg/m.  ROS  Review of Systems  Constitutional: Positive for activity change.  Musculoskeletal: Positive for arthralgias, gait problem and myalgias.  Psychiatric/Behavioral: The patient is nervous/anxious.   All other systems reviewed and are negative.   All other systems reviewed and are negative.  The following is a summary of the past history medically, past history surgically, known current medicines, social history and family history.  This information is gathered electronically by the computer from prior information and documentation.  I review this each visit and have found including this information at this point in the chart is beneficial and informative.    Past Medical History:  Diagnosis Date  . ADHD    As a child  . Anxiety   . Bipolar 1 disorder ([place])   . Cervical kyphosis   . Depression   . Fatty liver   . Febrile seizure ([place])    as child, only one, was never put on meds.  . Fibromyalgia   . MVP (mitral valve prolapse)   . OCD (obsessive compulsive disorder)   . Personality disorder ([place])   . PTSD (post-traumatic stress disorder)   . Pyloric stenosis     Past Surgical History:  Procedure Laterality Date  . APPENDECTOMY  02/17/2014  . COLONOSCOPY WITH PROPOFOL N/A 09/03/2018   Procedure: COLONOSCOPY WITH PROPOFOL;  Surgeon: Virgel Manifold, MD;  Location: [facility];  Service: Endoscopy;  Laterality: N/A;  . ESOPHAGOGASTRODUODENOSCOPY (EGD) WITH PROPOFOL N/A 09/03/2018   Procedure: ESOPHAGOGASTRODUODENOSCOPY (EGD) WITH PROPOFOL;  Surgeon:  Virgel Manifold, MD;  Location: [facility] END[facility]COPY;  Service: Endoscopy;  Laterality: N/A;  . EUS N/A 10/14/2018   Procedure: FULL UPPER ENDOSCOPIC ULTRASOUND (EUS) RADIAL;  Surgeon: Jola Schmidt, MD;  Location: [facility] END[facility]COPY;  Service: Endoscopy;  Laterality: N/A;  . INTRAUTERINE DEVICE (IUD) INSERTION N/A 11/15/2019   Procedure: INTRAUTERINE DEVICE (IUD) INSERTION ([facility] [place]);  Surgeon: Jonnie Kind, MD;  Location: [facility];  Service: Gynecology;  Laterality: N/A;  . LABIOPLASTY Bilateral 11/15/2019   Procedure: BILATERAL REDUCTION LABIAPLASTY;  Surgeon: Jonnie Kind, MD;  Location: [facility];  Service: Gynecology;  Laterality: Bilateral;  . pyloric stenosis repair      Family History  Problem Relation Age of Onset  . COPD Mother   . Hypertension Mother   . Asthma Mother   . Arthritis Mother   . Pancreatic cancer Mother        slow growing  . ADD / ADHD Mother   . Other Mother        Spinal Stenosis - currently in surgery today  . Bipolar disorder Mother   . Anxiety disorder Mother   . Depression Mother   . Parkinson's disease Mother   . GER disease Mother   . Asthma Brother   . Hernia Brother   . ADD / ADHD Brother   . Bipolar disorder Brother   . Hypertension Maternal Grandmother   . Heart disease Maternal Grandmother 53  . Rheumatic fever Maternal Grandmother   .  Heart attack Maternal Grandmother        Bypass Surgery  . Pancreatic cancer Maternal Grandfather   . Liver cancer Maternal Grandfather   . Asthma Maternal Grandfather   . Obesity Paternal Grandmother     Social History Social History   Tobacco Use  . Smoking status: Former Smoker    Years: 0.50    Types: Cigarettes    Start date: 01/01/2008  . Smokeless tobacco: Never Used  . Tobacco comment: Hookah only smoked at get togethers  Substance Use Topics  . Alcohol use: Not Currently  . Drug use: Not Currently    Types: Marijuana    Comment: 2015 last cocaine use.    Allergies  Allergen Reactions  .  Vyvanse [Lisdexamfetamine] Other (See Comments)    Bounce off of the wall    Current Outpatient Medications  Medication Sig Dispense Refill  . Amino Acids (L-CARNITINE PO) Take 500 mg by mouth in the morning and at bedtime.    . Cariprazine HCl (VRAYLAR) 4.5 MG CAPS Take 1 capsule (4.5 mg total) by mouth daily at 8 pm. 90 capsule 0  . Cholecalciferol (VITAMIN D) 50 MCG (2000 UT) tablet Take 2,000 Units by mouth daily.     . clonazePAM (KLONOPIN) 1 MG tablet Take 1 tablet (1 mg total) by mouth 2 (two) times daily as needed for anxiety. 60 tablet 1  . Cyanocobalamin (VITAMIN B 12) 500 MCG TABS Take 500 mcg by mouth daily.     . DULoxetine (CYMBALTA) 60 MG capsule Take 1 capsule (60 mg total) by mouth daily after supper. 90 capsule 0  . ELDERBERRY PO Take 1 tablet by mouth as needed (cold symptoms).     Marland Kitchen HYDROcodone-acetaminophen (NORCO/VICODIN) 5-325 MG tablet Take 1 tablet by mouth every 6 (six) hours as needed for moderate pain. May take with ibuprofen 10 tablet 0  . MAGNESIUM PO Take by mouth.    . MELATONIN PO Take 10 mg by mouth at bedtime.     . methylphenidate (RITALIN LA) 30 MG 24 hr capsule Take 1 capsule (30 mg total) by mouth every morning. 30 capsule 0  . [START ON 01/07/2020] methylphenidate (RITALIN LA) 30 MG 24 hr capsule Take 1 capsule (30 mg total) by mouth every morning. 30 capsule 0  . [START ON 02/06/2020] methylphenidate (RITALIN LA) 30 MG 24 hr capsule Take 1 capsule (30 mg total) by mouth every morning. 30 capsule 0  . Multiple Vitamin (MULTI-VITAMIN) tablet Take 1 tablet by mouth daily.     . naproxen (NAPROSYN) 500 MG tablet Take 1 tablet (500 mg total) by mouth 2 (two) times daily with a meal. 60 tablet 5  . Omega-3 Fatty Acids (FISH OIL) 1200 MG CAPS Take 1,200 mg by mouth daily.     Marland Kitchen omeprazole (PRILOSEC) 20 MG capsule Take 1 capsule (20 mg total) by mouth daily. 30 capsule 0  . OVER THE COUNTER MEDICATION Take 1 capsule by mouth in the morning and at bedtime. RSP  Conjugated Linoleic Acid (CLA)    (weight loss)     . Oxcarbazepine (TRILEPTAL) 300 MG tablet Take 1 tablet (300 mg total) by mouth 2 (two) times daily. 180 tablet 0  . polyethylene glycol powder (GLYCOLAX/MIRALAX) 17 GM/SCOOP powder Take one cap full of Miralax daily (Patient taking differently: Take 1 Container by mouth daily as needed for moderate constipation. ) 11475 g 0  . pyridOXINE (VITAMIN B-6) 100 MG tablet Take 100 mg by mouth daily.    Marland Kitchen  vitamin A 3 MG (10000 UNITS) capsule Take 10,000 Units by mouth daily.    Marland Kitchen VITAMIN K PO Take by mouth.    . methylphenidate (RITALIN LA) 30 MG 24 hr capsule Take 1 capsule (30 mg total) by mouth every morning. 30 capsule 0   No current facility-administered medications for this visit.     Physical Exam  Blood pressure (!) 145/74, pulse 90, height 4\' 11"  (1.499 m), weight 185 lb (83.9 kg).  Constitutional: overall normal hygiene, normal nutrition, well developed, normal grooming, normal body habitus. Assistive device:none  Musculoskeletal: gait and station Limp right, muscle tone and strength are normal, no tremors or atrophy is present.  .  Neurological: coordination overall normal.  Deep tendon reflex/nerve stretch intact.  Sensation normal.  Cranial nerves II-XII intact.   Skin:   Normal overall no scars, lesions, ulcers or rashes. No psoriasis.  Psychiatric: Alert and oriented x 3.  Recent memory intact, remote memory unclear.  Normal mood and affect. Well groomed.  Good eye contact.  Cardiovascular: overall no swelling, no varicosities, no edema bilaterally, normal temperatures of the legs and arms, no clubbing, cyanosis and good capillary refill.  Lymphatic: palpation is normal.  Right knee with pain, ROM 0 to 110, crepitus effusion, positive medial [place], limp right, no distal edema, NV intact.  All other systems reviewed and are negative   The patient has been educated about the nature of the problem(s) and counseled on  treatment options.  The patient appeared to understand what I have discussed and is in agreement with it.  Encounter Diagnoses  Name Primary?  . Chronic pain of right knee Yes  . Fibromyalgia syndrome     PLAN Call if any problems.  Precautions discussed.  Continue current medications.   Return to clinic 2 weeks   Get MRI of the right knee.  Electronically Signed , MD 5/4/202110:50 AM

## 2019-12-22 ENCOUNTER — Other Ambulatory Visit: Payer: Self-pay

## 2019-12-22 ENCOUNTER — Ambulatory Visit (INDEPENDENT_AMBULATORY_CARE_PROVIDER_SITE_OTHER): Payer: Medicaid Other | Admitting: Licensed Clinical Social Worker

## 2019-12-22 DIAGNOSIS — F603 Borderline personality disorder: Secondary | ICD-10-CM | POA: Diagnosis not present

## 2019-12-22 DIAGNOSIS — F909 Attention-deficit hyperactivity disorder, unspecified type: Secondary | ICD-10-CM

## 2019-12-22 DIAGNOSIS — F4310 Post-traumatic stress disorder, unspecified: Secondary | ICD-10-CM | POA: Diagnosis not present

## 2019-12-22 DIAGNOSIS — F3161 Bipolar disorder, current episode mixed, mild: Secondary | ICD-10-CM | POA: Diagnosis not present

## 2019-12-22 NOTE — Progress Notes (Signed)
Virtual Visit via Video Note  I connected withChristine [id number] Webex video applicationand verified that I am speaking with the correct person using two identifiers.  I discussed the limitations, risks, security and privacy concerns of performing an evaluation and management service by telephone and the availability of in person appointments. I also discussed with the patient that there may be a patient responsible charge related to this service. The patient expressed understanding and agreed to proceed.  I discussed the assessment and treatment plan with the patient. The patient was provided an opportunity to ask questions and all were answered. The patient agreed with the plan and demonstrated an understanding of the instructions.  The patient was advised to call back or seek an in-person evaluation if the symptoms worsen or if the condition fails to improve as anticipated.  I provided38 minutesof non-face-to-face time during this encounter.   [name], LCSW, LCAS ________________________ THERAPIST PROGRESS NOTE  Session Time:2:00pm - 2:38pm   Participation Level:Active  Behavioral Response:Alert, well groomed, casually dressed,euthymicmood/affect  Type of Therapy: Individual Therapy  Treatment Goals addressed:Self-care routine; anxiety management;Medication management; Diet and exercise; Communication skills and boundaries   Interventions:CBT, communication skills/boundaries   Summary:[name] is a 29 year old female thatpresented for virtual session today via Webex and is diagnosed withBipolar I Disorder, mixed, mild; Borderline personality disorder; PTSD;andADHD, unspecified.  Suicidal/Homicidal:None;without intent or plan  Therapist Response:Clinician met with Courtney [name] for [facility] appointment today.  Clinician assessed for safety, sobriety, and medication compliance.  Courtney [name] presented for  meeting on time and was alert, oriented x5, with no evidence or self-report of SI/HI or A/V H.  Courtney [name] reported that she continues taking medication as prescribed.  Courtney [name] reported that she continues vaping 'delta 8' several times per day to assist in anxiety reduction with no ill effects.  Clinician inquired about Courtney [name]'s emotional ratings today, as well as any significant changes in thoughts, feelings, or behavior since last session.  Courtney [name] reported scores of 0/10 for depression and 1/10 for anxiety, and stated "Everything has been going pretty good.  I have some manic moments but I'm doing alright".  Clinician inquired about progress Courtney [name] has made towards goals in past weeks, as well as current barriers to success.  Courtney [name] reported that she continues working 6-7 hours per week to earn money and stay productive, which has allowed her to save money, as well as meet new people to socialize with and curb related anxiety.  Courtney [name] reported that one new friend she met has introduced her to new hobbies as well, such as tarot and reiki, which she hopes will lead to mental health improvement.  Courtney [name] reported that she has not been exercising regularly, but did stretch and do some cardio for 20 minutes earlier this week.  She reported that she has been reading, meditating, and listening to calming music while practicing her breathing to stay relaxed.  Courtney [name] reported that her relationship with her partner has been good past 2 weeks, with no arguments of note, stating "I'm learning to watch my language, tone, and nonverbals more".  Courtney [name] reported that the only challenge she faced was on Mother's Day when her grandma had something come up and needed a favor, but Courtney [name] had to turn her down because of a work shift.  Clinician discussed topic of assertive communication skills and how this requires balancing the wants and needs of oneself while taking into consideration the needs of others,  without being passive or aggressive.  Clinician discussed various traits of assertive  communicators, such as maintaining eye contact, avoiding interruptions, watching tone, and volume.  Clinician also explained how this ties into maintenance of healthy boundaries by saying 'No' when necessary to requests that are too demanding.  Courtney [name] was receptive to this information and noted that in the past she has put her own needs to the side due to feeling in debt to her grandmother, and stated "She relies on me a lot".  Courtney [name] reported that she was polite in turning her grandmother down and expressed empathy, but her grandmother responded in a passive aggressive manner, so she ultimately chose to remove herself from the situation when they could not come to an agreement.  Courtney [name] reported that they talked about this another day, and were able to resolve their differences, stating "She's not used to me having a schedule".  Clinician encouraged Courtney [name] to continue working on communication and boundary issues to strengthen support system, improve ability to assert herself, and avoid bottling up feelings of resentment.  Courtney [name] stated "I think I've been doing really good lately.  I'm in a better state of mind and proud of myself".  Courtney [name] asked if it would be okay to end session early, as she did not have any additional needs to address today.  She reported that she would follow up in 1 week and continue with plan based upon progress being seen.  Clinician will continue to monitor.    Plan:Meet again in 1 week virtually.   Diagnosis:Bipolar I Disorder, mixed, mild; Borderline personality disorder; PTSD; ADHD, unspecified  [name], LCSW, LCAS 12/22/19

## 2019-12-29 ENCOUNTER — Other Ambulatory Visit: Payer: Self-pay

## 2019-12-29 ENCOUNTER — Ambulatory Visit (INDEPENDENT_AMBULATORY_CARE_PROVIDER_SITE_OTHER): Payer: Medicaid Other | Admitting: Licensed Clinical Social Worker

## 2019-12-29 DIAGNOSIS — F603 Borderline personality disorder: Secondary | ICD-10-CM | POA: Diagnosis not present

## 2019-12-29 DIAGNOSIS — F909 Attention-deficit hyperactivity disorder, unspecified type: Secondary | ICD-10-CM

## 2019-12-29 DIAGNOSIS — F4310 Post-traumatic stress disorder, unspecified: Secondary | ICD-10-CM

## 2019-12-29 DIAGNOSIS — F3161 Bipolar disorder, current episode mixed, mild: Secondary | ICD-10-CM

## 2019-12-29 NOTE — Progress Notes (Signed)
Virtual Visit via Video Note  I connected withChristine Sharpeon5/20/21at11:00ambyvideo enabled telemedicine applicationand verified that I am speaking with the correct person using two identifiers.  I discussed the limitations, risks, security and privacy concerns of performing an evaluation and management service by telephone and the availability of in person appointments. I also discussed with the patient that there may be a patient responsible charge related to this service. The patient expressed understanding and agreed to proceed.  I discussed the assessment and treatment plan with the patient. The patient was provided an opportunity to ask questions and all were answered. The patient agreed with the plan and demonstrated an understanding of the instructions.  The patient was advised to call back or seek an in-person evaluation if the symptoms worsen or if the condition fails to improve as anticipated.  I provided1 hourof non-face-to-face time during this encounter.   [name], LCSW, LCAS ________________________ THERAPIST PROGRESS NOTE  Session Time:11:00am - 12:00pm   Participation Level:Active  Behavioral Response:Alert, well groomed, casually dressed,anxiousmood/affect  Type of Therapy: Individual Therapy  Treatment Goals addressed:Self-care routine; anxiety management;Medication management; Diet and exercise; Communication skills and boundaries   Interventions:CBT, communication skills/boundaries   Summary:[name] is a 29 year old female thatpresented for virtual session today and is diagnosed withBipolar I Disorder, mixed, mild; Borderline personality disorder; PTSD;andADHD, unspecified.  Suicidal/Homicidal:None;without intent or plan  Therapist Response:Clinician met with [name] for virtual therapy session today.  Clinician assessed for safety, sobriety, and medication compliance.  [name] presented for  appointment on time and was alert, oriented x5, with no evidence or self-report of SI/HI or A/V H.  [name] reported that she did end up missing 3 days worth of medication in past week after running out while staying at her boyfriend's place.  She acknowledged that she could have had him drive her over to pick up from her home, but could not provide a reason as to why she didn't.  She reported that she continues using 'Delta-8' at least x1 per day and denied negative impact on mood or functioning.  Clinician inquired about [name]'s current emotional ratings, as well as any significant changes in thoughts, feelings, or behavior since previous meeting.  [name] reported scores of 1/10 for depression and 5/10 for anxiety, and stated "I know its partly because of me missing my meds.  I'm more scatterbrained, agitated anxious, and overwhelmed.  A little manic too".  Clinician inquired about whether [name] has had any panic attacks recently as a result of mood instability, as well as techniques employed to intervene and de-escalate.  [name] acknowledged that she has had 3 panic attacks in past week, including one yesterday when she finally got back to her home after staying with the boyfriend for a week.  [name] reported that she believes this was related to lack of routine and self-care, stating "I hadn't been in my room for days, and that's where I meditate and feel safest.  Its like a sacred space for me".  [name] reported that she avoided taking her antianxiety medication or using Delta-8 when she felt herself panicking, and instead focused on her breathing, as well as listened to calming music and burned incense.  Clinician inquired about [name]'s insight into what led to increased feelings of stress, and how she intends to get back on track with previous self-care routine in following days.  [name] reported that she has been busier with her part-time job, and this is likely to increase in  hours as they are discussing possibility  of her going full-time soon.  She reported that this isn't stressful, but more motivating, and she believes it will help her become more responsible and financially secure.  [name] reported that she has also been trying to prioritize going to the gym more as an outlet for growing stress.  Clinician inquired about the impact her current relationship is having on her mood and focus towards goals at this time.  [name] reported that although her partner has made some improvements in behavior since reconnecting, this has not been without issue, as she stated "I feel like we don't always see eye to eye, and he won't give me space.  Its almost suffocating".  [name] admitted that since rekindling relationship, there has been lack of structure and more inconsistency in routine, as she holds different sleep patterns between households, and this has also led to interpersonal issues with her grandparents, since "We're no longer on the same page".  She stated "My priorities are definitely not where they should be right now".  Clinician revisited topic of assertive communication skills and healthy boundaries with [name] today, inquiring about what issues she has noticed currently in regard to the imbalance in her relationship, and how she can articulate this in conversation with her partner to make requests for change without coming off as passive, or aggressive.  [name] reported that she will sit down with her partner after making a list of issues, including how they are spending too much time together, he never trusts her word and will go as far as checking her phone without approval, she fears he will push friends away, and experiencing increased mood instability as a result of this influence.  [name] reported that she intends to "Put my foot down and be firm, but not overly emotional or sharp in tone", and if he will not respect her boundaries, she will end the  relationship to protect her own emotional wellbeing.  Clinician was supportive of this approach, and encouraged [name] to also use following week to reestablish healthy routine to focus on goals, and maintain consistency with prescribed medication routine.  Clinician will continue to monitor.         Plan:Meet again in 1 week virtually.   Diagnosis:Bipolar I Disorder, mixed, mild; Borderline personality disorder; PTSD; ADHD, unspecified  [name], LCSW, LCAS 12/29/19

## 2020-01-03 ENCOUNTER — Ambulatory Visit: Payer: Self-pay | Admitting: Orthopaedic Surgery

## 2020-01-03 ENCOUNTER — Ambulatory Visit: Payer: Medicaid Other | Admitting: Obstetrics and Gynecology

## 2020-01-04 ENCOUNTER — Other Ambulatory Visit: Payer: Self-pay

## 2020-01-04 ENCOUNTER — Ambulatory Visit (HOSPITAL_COMMUNITY)
Admission: RE | Admit: 2020-01-04 | Discharge: 2020-01-04 | Disposition: A | Discharge status: Home / Self Care | Payer: Medicaid Other | Source: Ambulatory Visit | Attending: Orthopaedic Surgery | Admitting: Orthopaedic Surgery

## 2020-01-04 DIAGNOSIS — G8929 Other chronic pain: Secondary | ICD-10-CM | POA: Diagnosis present

## 2020-01-04 DIAGNOSIS — M25561 Pain in right knee: Secondary | ICD-10-CM | POA: Insufficient documentation

## 2020-01-05 ENCOUNTER — Telehealth (HOSPITAL_COMMUNITY): Payer: Self-pay | Admitting: *Deleted

## 2020-01-05 ENCOUNTER — Ambulatory Visit (INDEPENDENT_AMBULATORY_CARE_PROVIDER_SITE_OTHER): Payer: Medicaid Other | Admitting: Licensed Clinical Social Worker

## 2020-01-05 DIAGNOSIS — F4310 Post-traumatic stress disorder, unspecified: Secondary | ICD-10-CM | POA: Diagnosis not present

## 2020-01-05 DIAGNOSIS — F909 Attention-deficit hyperactivity disorder, unspecified type: Secondary | ICD-10-CM | POA: Diagnosis not present

## 2020-01-05 DIAGNOSIS — F3161 Bipolar disorder, current episode mixed, mild: Secondary | ICD-10-CM | POA: Diagnosis not present

## 2020-01-05 DIAGNOSIS — F603 Borderline personality disorder: Secondary | ICD-10-CM | POA: Diagnosis not present

## 2020-01-05 NOTE — Progress Notes (Signed)
Virtual Visit via Video Note   I connected with Courtney [name] on 01/05/20 at 11:00am by video enabled telemedicine application and verified that I am speaking with the correct person using two identifiers.   I discussed the limitations, risks, security and privacy concerns of performing an evaluation and management service by telephone and the availability of in person appointments. I also discussed with the patient that there may be a patient responsible charge related to this service. The patient expressed understanding and agreed to proceed.   I discussed the assessment and treatment plan with the patient. The patient was provided an opportunity to ask questions and all were answered. The patient agreed with the plan and demonstrated an understanding of the instructions.   The patient was advised to call back or seek an in-person evaluation if the symptoms worsen or if the condition fails to improve as anticipated.   I provided 1 hour of non-face-to-face time during this encounter.     [name], LCSW, LCAS ________________________ THERAPIST PROGRESS NOTE   Session Time: 11:00am - 12:00pm   Location: Patient: Patient Home Provider: [facility]    Participation Level: Active   Behavioral Response: Alert, well groomed, casually dressed, manic mood/affect   Type of Therapy:  Individual Therapy   Treatment Goals addressed: Self-care routine; anxiety management; Medication management; Diet and exercise; Communication skills and boundaries    Interventions: CBT, communication skills/boundaries    Summary: Courtney [name] is a 29 year old female that presented for virtual session today and is diagnosed with Bipolar I Disorder, mixed, mild; Borderline personality disorder; PTSD; and ADHD, unspecified.        Suicidal/Homicidal: None; without intent or plan   Therapist Response: Clinician met with Courtney [name] for virtual appointment today.  Clinician assessed for safety, sobriety,  and medication compliance.  Courtney [name] presented for session on time and was alert, oriented x5, with no evidence or self-report of SI/HI or A/V H.  Shyna reported that she has been taking medication as prescribed with no gaps.  Courtney [name] reported that she has discontinued use of Delta 8 and wants to increase focus on improving health, diet and exercise instead without reliance on substances.  Clinician was supportive of this, and inquired about Courtney [name]'s emotional ratings today, as well as any significant changes in thoughts, feelings, or behavior since last session.  Courtney [name] reported scores of 0/10 for depression, 5/10 for anxiety, and 10/10 for mania, noting that she tends to feel more manic and unfocused when she is on her menstrual cycle.  Clinician inquired about whether she has had any panic attacks recently, and how she has handled these.  Courtney [name] reported that it is hard for her to remember, but she estimates that she may have had 5 in past week due to relationship problems. She reported that she continues practice of relaxation skills to intervene when experiencing these, and stated "They're helpful for taking the edge off".  Clinician inquired about the nature of Courtney [name]'s most recent issues with relationship, including whether she has made progress with improving boundaries and open communication.  Courtney [name] reported that she and her partner separated last Friday when tension reached a breaking point, and her partner gave her an ultimatum of cutting ties with her close friends, or losing him.  Courtney [name] reported that she had increasingly become aware of how toxic the relationship was becoming again, thought back to previous therapy sessions that covered red flag behavior, and felt like he was exhibiting too much control over her  life, so she stood up for herself and requested that they simply try to be friends instead if possible, as she does value their friendship.  Courtney [name] reported that  she was proud of this, and stated "It made me realize my self-worth and reminded me that I don't need to be treated that way".  Clinician praised Courtney [name] for her insight into ongoing pattern of unhealthy behaviors, advocation against mistreatment, and discussed ways to implement healthier boundaries with this individual so that she can focus more on her personal needs.  Courtney [name] reported that they will only hang out when going to the gym, she will keep conversations shorter, avoid being 'on call' to him 24/7, and put her own priorities first, acknowledging that she has fallen behind on exercise, and self-care.  She stated "This whole thing taught me that I have a good support system, and frees me up to meet more people that won't try to change me, but accept me for who I truly am".  Clinician inquired about what Courtney [name]'s priorities will be in upcoming days to get back on track with goals.  Courtney [name] reported that she spent time with her grandmother together buying strawberries and catching up, will get a haircut, spend time with her friends, and attempt to get back into exercising regularly as outlet for stress and health improvement. She stated "I think I have a better outlook because I'm realizing how powerful my thoughts can be if I focus on the positives instead of negatives like he did".  Clinician encouraged Courtney [name] to revisit gratitude journaling to aid in process of improving outlook and highlighting positives in her life.  Clinician will continue to monitor.             Plan: Meet again in 1 week virtually.   Diagnosis: Bipolar I Disorder, mixed, mild; Borderline personality disorder; PTSD; ADHD, unspecified    [name], LCSW, LCAS 01/05/20

## 2020-01-05 NOTE — Telephone Encounter (Signed)
I reviewed her chart.  Usually Cymbalta and Ritalin can cause increase in manic symptoms.  I will defer increasing these medication.  She is taking Vraylar 4.5 mg she can consider going up to 6 mg or try low-dose olanzapine 2.5 mg at bedtime which she has taken in the past.

## 2020-01-05 NOTE — Telephone Encounter (Signed)
Writer spoke with pt regarding her request to increase both Cymbalta and Ritalin for "manic' bx. And offered both increase in Vrylar to 6mg  or Zyprexa 2.5mg  instead. Pt refused both stating it's only the week before she gets her period and still feels increase in Cynbalta and Ritalin would help. Writer has explained that both medication can cause increase in mania. Pt will wait to talk to Dr. .

## 2020-01-05 NOTE — Telephone Encounter (Signed)
Pt called today requesting an increase in both the Cymbalta 60mg  and the Ritalin 30 mg La as she feels she's becoming manic. Pt admits this happens regularly before her period. This nurse did not notice hypomania in message however at points pt did have some rapid speech. Pt has an appointment with Dr. on 02/08/20.

## 2020-01-06 NOTE — Telephone Encounter (Signed)
Ok

## 2020-01-10 ENCOUNTER — Other Ambulatory Visit: Payer: Self-pay

## 2020-01-10 ENCOUNTER — Encounter: Payer: Self-pay | Admitting: Orthopaedic Surgery

## 2020-01-10 ENCOUNTER — Ambulatory Visit: Payer: Medicaid Other | Admitting: Orthopaedic Surgery

## 2020-01-10 VITALS — BP 125/72 | HR 90 | Ht 59.0 in | Wt 185.0 lb

## 2020-01-10 DIAGNOSIS — M25561 Pain in right knee: Secondary | ICD-10-CM | POA: Diagnosis not present

## 2020-01-10 DIAGNOSIS — G8929 Other chronic pain: Secondary | ICD-10-CM

## 2020-01-10 DIAGNOSIS — M797 Fibromyalgia: Secondary | ICD-10-CM

## 2020-01-10 NOTE — Progress Notes (Signed)
Patient Courtney [name], female DOB:[birth year], 29 y.o. RXV:[id number]  Chief Complaint  Patient presents with  . Knee Pain    right   . Results    review MRI     HPI  Courtney [name] is a 29 y.o. female who has right knee pain. She had MRI which showed: IMPRESSION: 1. Intact ligamentous structures and no acute bony findings. 2. No meniscal tears and normal articular cartilage. 3. No joint effusion. Small Baker's cyst.  I have explained the findings.  She does not need surgery.  She cannot tolerate NSAIDs.  Her insurance does not pay for PT.  I have told her to use the rubs, ice.  I have independently reviewed the MRI.  Body mass index is 37.37 kg/m.  ROS  Review of Systems  Constitutional: Positive for activity change.  Musculoskeletal: Positive for arthralgias, gait problem and myalgias.  Psychiatric/Behavioral: The patient is nervous/anxious.   All other systems reviewed and are negative.   All other systems reviewed and are negative.  The following is a summary of the past history medically, past history surgically, known current medicines, social history and family history.  This information is gathered electronically by the computer from prior information and documentation.  I review this each visit and have found including this information at this point in the chart is beneficial and informative.    Past Medical History:  Diagnosis Date  . ADHD    As a child  . Anxiety   . Bipolar 1 disorder (HCC)   . Cervical kyphosis   . Depression   . Fatty liver   . Febrile seizure (HCC)    as child, only one, was never put on meds.  . Fibromyalgia   . MVP (mitral valve prolapse)   . OCD (obsessive compulsive disorder)   . Personality disorder (HCC)   . PTSD (post-traumatic stress disorder)   . Pyloric stenosis     Past Surgical History:  Procedure Laterality Date  . APPENDECTOMY  02/17/2014  . COLONOSCOPY WITH PROPOFOL N/A 09/03/2018   Procedure: COLONOSCOPY  WITH PROPOFOL;  Surgeon: Pasty Spillers, MD;  Location: [facility];  Service: Endoscopy;  Laterality: N/A;  . ESOPHAGOGASTRODUODENOSCOPY (EGD) WITH PROPOFOL N/A 09/03/2018   Procedure: ESOPHAGOGASTRODUODENOSCOPY (EGD) WITH PROPOFOL;  Surgeon: Pasty Spillers, MD;  Location: [facility];  Service: Endoscopy;  Laterality: N/A;  . EUS N/A 10/14/2018   Procedure: FULL UPPER ENDOSCOPIC ULTRASOUND (EUS) RADIAL;  Surgeon: Rayann Heman, MD;  Location: [facility];  Service: Endoscopy;  Laterality: N/A;  . INTRAUTERINE DEVICE (IUD) INSERTION N/A 11/15/2019   Procedure: INTRAUTERINE DEVICE (IUD) INSERTION (PARA GARD);  Surgeon: Tilda Burrow, MD;  Location: [facility];  Service: Gynecology;  Laterality: N/A;  . LABIOPLASTY Bilateral 11/15/2019   Procedure: BILATERAL REDUCTION LABIAPLASTY;  Surgeon: Tilda Burrow, MD;  Location: [facility];  Service: Gynecology;  Laterality: Bilateral;  . pyloric stenosis repair      Family History  Problem Relation Age of Onset  . COPD Mother   . Hypertension Mother   . Asthma Mother   . Arthritis Mother   . Pancreatic cancer Mother        slow growing  . ADD / ADHD Mother   . Other Mother        Spinal Stenosis - currently in surgery today  . Bipolar disorder Mother   . Anxiety disorder Mother   . Depression Mother   . Parkinson's disease Mother   . GER disease Mother   .  Asthma Brother   . Hernia Brother   . ADD / ADHD Brother   . Bipolar disorder Brother   . Hypertension Maternal Grandmother   . Heart disease Maternal Grandmother 29  . Rheumatic fever Maternal Grandmother   . Heart attack Maternal Grandmother        Bypass Surgery  . Pancreatic cancer Maternal Grandfather   . Liver cancer Maternal Grandfather   . Asthma Maternal Grandfather   . Obesity Paternal Grandmother     Social History Social History   Tobacco Use  . Smoking status: Former Smoker    Years: 0.50    Types: Cigarettes    Start date: 01/01/2008  . Smokeless  tobacco: Never Used  . Tobacco comment: Hookah only smoked at get togethers  Substance Use Topics  . Alcohol use: Not Currently  . Drug use: Not Currently    Types: Marijuana    Comment: 2015 last cocaine use.    Allergies  Allergen Reactions  . Vyvanse [Lisdexamfetamine] Other (See Comments)    Bounce off of the wall    Current Outpatient Medications  Medication Sig Dispense Refill  . Ascorbic Acid (VITAMIN C) 1000 MG tablet Take 1,000 mg by mouth daily.    Marland Kitchen BIOTIN 5000 PO Take by mouth.    . Cariprazine HCl (VRAYLAR) 4.5 MG CAPS Take 1 capsule (4.5 mg total) by mouth daily at 8 pm. 90 capsule 0  . Cholecalciferol (VITAMIN D) 50 MCG (2000 UT) tablet Take 2,000 Units by mouth daily.     . clonazePAM (KLONOPIN) 1 MG tablet Take 1 tablet (1 mg total) by mouth 2 (two) times daily as needed for anxiety. 60 tablet 1  . Cyanocobalamin (VITAMIN B 12) 500 MCG TABS Take 500 mcg by mouth daily.     . DULoxetine (CYMBALTA) 60 MG capsule Take 1 capsule (60 mg total) by mouth daily after supper. 90 capsule 0  . ELDERBERRY PO Take 1 tablet by mouth as needed (cold symptoms).     Marland Kitchen HYDROcodone-acetaminophen (NORCO/VICODIN) 5-325 MG tablet Take 1 tablet by mouth every 6 (six) hours as needed for moderate pain. May take with ibuprofen 10 tablet 0  . MELATONIN PO Take 10 mg by mouth at bedtime.     . methylphenidate (RITALIN LA) 30 MG 24 hr capsule Take 1 capsule (30 mg total) by mouth every morning. 30 capsule 0  . [START ON 02/06/2020] methylphenidate (RITALIN LA) 30 MG 24 hr capsule Take 1 capsule (30 mg total) by mouth every morning. 30 capsule 0  . milk thistle 175 MG tablet Take 175 mg by mouth daily.    . Multiple Vitamin (MULTI-VITAMIN) tablet Take 1 tablet by mouth daily.     . Omega-3 Fatty Acids (FISH OIL) 1200 MG CAPS Take 1,200 mg by mouth daily.     Marland Kitchen OVER THE COUNTER MEDICATION Take 1 capsule by mouth in the morning and at bedtime. RSP Conjugated Linoleic Acid (CLA)    (weight loss)      . Oxcarbazepine (TRILEPTAL) 300 MG tablet Take 1 tablet (300 mg total) by mouth 2 (two) times daily. 180 tablet 0  . Zinc 22.5 MG TABS Take by mouth.    . methylphenidate (RITALIN LA) 30 MG 24 hr capsule Take 1 capsule (30 mg total) by mouth every morning. 30 capsule 0  . methylphenidate (RITALIN LA) 30 MG 24 hr capsule Take 1 capsule (30 mg total) by mouth every morning. 30 capsule 0   No current facility-administered medications  for this visit.     Physical Exam  Blood pressure 125/72, pulse 90, height 4\' 11"  (1.499 m), weight 185 lb (83.9 kg).  Constitutional: overall normal hygiene, normal nutrition, well developed, normal grooming, normal body habitus. Assistive device:none  Musculoskeletal: gait and station Limp right, muscle tone and strength are normal, no tremors or atrophy is present.  .  Neurological: coordination overall normal.  Deep tendon reflex/nerve stretch intact.  Sensation normal.  Cranial nerves II-XII intact.   Skin:   Normal overall no scars, lesions, ulcers or rashes. No psoriasis.  Psychiatric: Alert and oriented x 3.  Recent memory intact, remote memory unclear.  Normal mood and affect. Well groomed.  Good eye contact.  Cardiovascular: overall no swelling, no varicosities, no edema bilaterally, normal temperatures of the legs and arms, no clubbing, cyanosis and good capillary refill.  Lymphatic: palpation is normal.  Right knee has effusion, crepitus, ROM 0 to 105, limp right, pain right medial knee, stable.  All other systems reviewed and are negative   The patient has been educated about the nature of the problem(s) and counseled on treatment options.  The patient appeared to understand what I have discussed and is in agreement with it.  Encounter Diagnoses  Name Primary?  . Chronic pain of right knee Yes  . Fibromyalgia syndrome     PLAN Call if any problems.  Precautions discussed.  Continue current medications.   Return to clinic 6  weeks   Electronically Signed Sanjuana Kava, MD 6/1/202110:52 AM

## 2020-01-11 ENCOUNTER — Ambulatory Visit: Payer: Medicaid Other | Admitting: Obstetrics and Gynecology

## 2020-01-12 ENCOUNTER — Other Ambulatory Visit: Payer: Self-pay

## 2020-01-12 ENCOUNTER — Ambulatory Visit (INDEPENDENT_AMBULATORY_CARE_PROVIDER_SITE_OTHER): Payer: Medicaid Other | Admitting: Licensed Clinical Social Worker

## 2020-01-12 DIAGNOSIS — F603 Borderline personality disorder: Secondary | ICD-10-CM | POA: Diagnosis not present

## 2020-01-12 DIAGNOSIS — F909 Attention-deficit hyperactivity disorder, unspecified type: Secondary | ICD-10-CM

## 2020-01-12 DIAGNOSIS — F4310 Post-traumatic stress disorder, unspecified: Secondary | ICD-10-CM

## 2020-01-12 DIAGNOSIS — F3161 Bipolar disorder, current episode mixed, mild: Secondary | ICD-10-CM

## 2020-01-12 NOTE — Progress Notes (Signed)
Virtual Visit via Video Note  I connected withChristine Sharpeon6/3/21at11:00ambyvideo enabled telemedicine applicationand verified that I am speaking with the correct person using two identifiers.  I discussed the limitations, risks, security and privacy concerns of performing an evaluation and management service by telephone and the availability of in person appointments. I also discussed with the patient that there may be a patient responsible charge related to this service. The patient expressed understanding and agreed to proceed.  I discussed the assessment and treatment plan with the patient. The patient was provided an opportunity to ask questions and all were answered. The patient agreed with the plan and demonstrated an understanding of the instructions.  The patient was advised to call back or seek an in-person evaluation if the symptoms worsen or if the condition fails to improve as anticipated.  I provided1 hourof non-face-to-face time during this encounter.   [name], LCSW, LCAS ________________________ THERAPIST PROGRESS NOTE  Session Time:11:00am -12:00pm  Location: Patient: Patient Home Provider: [facility]   Participation Level:Active  Behavioral Response:Alert, well groomed, casually dressed,anxiousmood/affect  Type of Therapy: Individual Therapy  Treatment Goals addressed:Self-care routine; anxiety management;Medication management; Diet and exercise; Communication skills and boundaries; Scheduling   Interventions:CBT, communication skills/boundaries  Summary:[name] is a 29 year old female thatpresented for virtual session todayand is diagnosed withBipolar I Disorder, mixed, mild; Borderline personality disorder; PTSD;andADHD, unspecified.  Suicidal/Homicidal:None;without intent or plan  Therapist Response:Clinician met with Courtney [name] for virtual therapy today. Clinicianassessed for safety,  sobriety, and medication compliance. Courtney [name] presented for appointment on time and was alert, oriented x5, with no evidence or self-report of SI/HI or A/V H. Courtney [name] reported that she is taking medication as prescribed, with the exception of Ritalin, which she discontinued 4 days ago when she believed it stopped being effective.  Clinician encouraged Courtney [name] to outreach her psychiatrist and/or nursing staff to discuss this change and avoid side effects.  Karma acknowledged that despite previous intent to cease use of Delta 8, she has reduced use to x1-2 times per week in a [name] reduction approach instead, stating "It was too hard to quit cold [place] for now".  Clinician inquired about Courtney [name]'s emotional ratings today, as well as any significant changes in thoughts, feelings, or behavior since previous appointment. Courtney [name] reported scores of 0/10 for depression, and 6/10 for anxiety, stating "I haven't felt as manic lately since my cycle started.  Today is a pretty good day".  Clinician inquired about reoccurrence of panic attacks in previous week.  Courtney [name] reported that she believes she experienced 1-2 of these, but noted that they were not as severe, and she was able to use deep breathing to intervene successfully.  Clinician revisited treatment goals with Courtney [name] to identify areas of progress towards developing self-care routine, maintaining consistent schedule, budget, boundaries, and exercise routine, as well as current barriers to success.  Courtney [name] reported that she has been watching budget closely since she is no longer employed, more intent on prioritizing needs vs wants.  She reported that she has not been consistent in following relaxation or self-care routine, due to having unpredictable schedule.  Clinician encouraged Courtney [name] to consider keeping a weekly schedule in planner to make her days more structured and identify time where she can focus on self-care activities and  relaxation to improve mood.  Courtney [name] acknowledged that she needs to prioritize this, as her schedule tends to be erratic, which stresses her out, can lead to pain from her fibromyalgia flaring up, and potentially influence her panic attacks.  Courtney [name] reported that this pain has prevented her from following exercise regimen, but she is still trying to stretch often, and would like to get outside and visit nearby parks.  Clinician inquired about whether Courtney [name] has been able to maintain healthier boundaries with her ex-boyfriend following recent breakup, and how this change has influenced her mental health.  Courtney [name] reported that they remain friends at the moment, but she has been assertive in clarifying that if he crosses any boundary lines, she will cut him out of her support network, and no longer tolerate disrespect if needs are not considered.  Courtney [name] reported that she no longer feels as overwhelmed due to this change in communication, has more time with her new friends to strengthen relationships and reduce isolation, and plans to make boundaries clear at the start of new friendships to improve balance and understanding. Intervention was effective, as evidenced by Courtney [name] reporting that therapy sessions continue to help her stay accountable to her personal goals, and raise awareness of problem areas to focus on each week.  Clinician will continue to monitor.   Plan:Meet again in 1 week virtually.   Diagnosis:Bipolar I Disorder, mixed, mild; Borderline personality disorder; PTSD; ADHD, unspecified  [name], LCSW, LCAS 01/12/20

## 2020-01-13 ENCOUNTER — Ambulatory Visit: Payer: Medicaid Other | Admitting: Obstetrics and Gynecology

## 2020-01-19 ENCOUNTER — Other Ambulatory Visit: Payer: Self-pay

## 2020-01-19 ENCOUNTER — Ambulatory Visit (INDEPENDENT_AMBULATORY_CARE_PROVIDER_SITE_OTHER): Payer: Medicaid Other | Admitting: Licensed Clinical Social Worker

## 2020-01-19 DIAGNOSIS — F3161 Bipolar disorder, current episode mixed, mild: Secondary | ICD-10-CM

## 2020-01-19 DIAGNOSIS — F603 Borderline personality disorder: Secondary | ICD-10-CM

## 2020-01-19 DIAGNOSIS — F902 Attention-deficit hyperactivity disorder, combined type: Secondary | ICD-10-CM | POA: Diagnosis not present

## 2020-01-19 DIAGNOSIS — F4310 Post-traumatic stress disorder, unspecified: Secondary | ICD-10-CM | POA: Diagnosis not present

## 2020-01-19 NOTE — Progress Notes (Signed)
Virtual Visit via Video Note  I connected withChristine Sharpeon6/10/21at2:00pmbyvideo enabled telemedicine applicationand verified that I am speaking with the correct person using two identifiers.  I discussed the limitations, risks, security and privacy concerns of performing an evaluation and management service by telephone and the availability of in person appointments. I also discussed with the patient that there may be a patient responsible charge related to this service. The patient expressed understanding and agreed to proceed.  I discussed the assessment and treatment plan with the patient. The patient was provided an opportunity to ask questions and all were answered. The patient agreed with the plan and demonstrated an understanding of the instructions.  The patient was advised to call back or seek an in-person evaluation if the symptoms worsen or if the condition fails to improve as anticipated.  I provided45 minutesof non-face-to-face time during this encounter.   [name], LCSW, LCAS ________________________ THERAPIST PROGRESS NOTE  Session Time:2:00pm - 2:45pm  Location: Patient: Patient Home Provider: [facility]   Participation Level:Active  Behavioral Response:Alert, well groomed, casually dressed,euthymicmood/affect  Type of Therapy: Individual Therapy  Treatment Goals addressed:Self-care routine; anxiety management;Medication management; Diet and exercise; Communication skills and boundaries  Interventions:CBT, communication skills/boundaries   Summary:[name] is a 29 year old female thatpresented for virtual session todayand is diagnosed withBipolar I Disorder, mixed, mild; Borderline personality disorder; PTSD;andADHD, combined type.  Suicidal/Homicidal:None;without intent or plan  Therapist Response:Clinician met with Courtney [name] for virtual appointment.  Clinician assessed for safety, sobriety,  and medication compliance.  Courtney [name] presented for session on time and was alert, oriented x5, with no evidence or self-report of SI/HI or A/V H.  Courtney [name] reported compliance with current medication regimen and noted that she has an upcoming psychiatry appointment to discuss ineffectiveness of Ritalin.  Courtney [name] reported that she has continued to use Delta 8, but minimized overall use, stating "I only use it once a day, if that.  Sometimes I don't use it all".  Clinician inquired about Courtney [name]'s current emotional ratings, as well as any significant changes in thoughts, feelings, or behavior since last session.  Courtney [name] reported scores of 1/10 for depression, and 1-2/10 for anxiety, stating "My cycle is over so I haven't been manic at all".  She also reported that she has only had 1 partial panic attack in past week when her ex-boyfriend contacted her while intoxicated and they had a disagreement.  Clinician inquired about present state of this relationship, and how Courtney [name] has been attempting to reinforce healthier boundaries to protect her mental health and overall stability.  Courtney [name] reported that he contacted her at midnight and could immediately tell he was intoxicated, so she was assertive, and let him know that this was not acceptable, and then blocked his number when he began to say hurtful things.  She reported that cutting the conversation short allowed the panic attack to subside and she will continue limiting contact with him if he cannot show respect for her.  Courtney [name] stated "I won't keep subjecting myself to that" and noted that she has been using this freedom as an opportunity to make new friends by joining a social app, and setting up dates to hang out with new friends.  Clinician was supportive of Courtney [name]'s efforts to strengthen support network, and discussed healthy characteristics/traits that she could look for in new friends, as well as warning signs to watch out for. Courtney [name]  reported that she will not be trying to enter a romantic relationship too quickly and instead focus on  herself, but people she will consider befriending include those that treat her with kindness, respect, have similar interests and spirituality.  She reported that she will make her boundaries clear at the start of contact, and will not hesitate to cut ties if they do not respect them.  Clinician inquired about Courtney [name]'s plans for the week ahead to stay on track with goals.  She reported that she would set aside self-care time for playing video games, continue meditating daily, watch her budget closely, and try to get exercise in when her fibromyalgia is not flaring up and impeding movement.  Clinician will continue to monitor.             Plan:Meet again in 1 week virtually.   Diagnosis:Bipolar I Disorder, mixed, mild; Borderline personality disorder; PTSD; ADHD, combined type.  [name], LCSW, LCAS 01/19/20

## 2020-01-23 ENCOUNTER — Encounter: Payer: Self-pay | Admitting: Obstetrics and Gynecology

## 2020-01-23 ENCOUNTER — Ambulatory Visit: Payer: Medicaid Other | Admitting: Obstetrics and Gynecology

## 2020-01-23 VITALS — BP 117/73 | HR 82 | Ht 59.0 in | Wt 209.6 lb

## 2020-01-23 DIAGNOSIS — Z30431 Encounter for routine checking of intrauterine contraceptive device: Secondary | ICD-10-CM | POA: Diagnosis not present

## 2020-01-23 NOTE — Progress Notes (Addendum)
Patient ID: Courtney [name], female   DOB: 1990/08/13, 29 y.o.   MRN: [id number]    [facility] Visit  @DATE @            Patient name: Courtney [name] MRN [id number]  Date of birth: [birth year]  CC & HPI:  [name] is a 29 y.o. female presenting today because she cannot feel her IUD strings. She reports that "her fingers are too short." She had a bilateral reduction labiaplasty on 11/15/2019 and had a Paragard IUD inserted at the same time. She has been sexually active regularly and had intercourse yesterday. The patient uses condoms "sometimes" and has never been pregnant. Her periods usually last 5 days but since the IUD insertion, they have been 11-13 days. The patient denies fever, chills or any other symptoms or complaints at this time.    ROS:  ROS - fever - chills All systems are negative except as noted in the HPI and PMH.   Pertinent History Reviewed:   Reviewed: Medical         Past Medical History:  Diagnosis Date  . ADHD    As a child  . Anxiety   . Bipolar 1 disorder ([place])   . Cervical kyphosis   . Depression   . Fatty liver   . Febrile seizure ([place])    as child, only one, was never put on meds.  . Fibromyalgia   . MVP (mitral valve prolapse)   . OCD (obsessive compulsive disorder)   . Personality disorder ([place])   . PTSD (post-traumatic stress disorder)   . Pyloric stenosis                               Surgical Hx:    Past Surgical History:  Procedure Laterality Date  . APPENDECTOMY  02/17/2014  . COLONOSCOPY WITH PROPOFOL N/A 09/03/2018   Procedure: COLONOSCOPY WITH PROPOFOL;  Surgeon: Virgel Manifold, MD;  Location: [facility];  Service: Endoscopy;  Laterality: N/A;  . ESOPHAGOGASTRODUODENOSCOPY (EGD) WITH PROPOFOL N/A 09/03/2018   Procedure: ESOPHAGOGASTRODUODENOSCOPY (EGD) WITH PROPOFOL;  Surgeon: Virgel Manifold, MD;  Location: [facility];  Service: Endoscopy;  Laterality: N/A;  . EUS N/A 10/14/2018   Procedure: FULL  UPPER ENDOSCOPIC ULTRASOUND (EUS) RADIAL;  Surgeon: Jola Schmidt, MD;  Location: [facility];  Service: Endoscopy;  Laterality: N/A;  . INTRAUTERINE DEVICE (IUD) INSERTION N/A 11/15/2019   Procedure: INTRAUTERINE DEVICE (IUD) INSERTION (PARA GARD);  Surgeon: Jonnie Kind, MD;  Location: [facility];  Service: Gynecology;  Laterality: N/A;  . LABIOPLASTY Bilateral 11/15/2019   Procedure: BILATERAL REDUCTION LABIAPLASTY;  Surgeon: Jonnie Kind, MD;  Location: [facility];  Service: Gynecology;  Laterality: Bilateral;  . pyloric stenosis repair     Medications: Reviewed & Updated - see associated section                       Current Outpatient Medications:  .  Ascorbic Acid (VITAMIN C) 1000 MG tablet, Take 1,000 mg by mouth daily., Disp: , Rfl:  .  BIOTIN 5000 PO, Take by mouth., Disp: , Rfl:  .  Cariprazine HCl (VRAYLAR) 4.5 MG CAPS, Take 1 capsule (4.5 mg total) by mouth daily at 8 pm., Disp: 90 capsule, Rfl: 0 .  Cholecalciferol (VITAMIN D) 50 MCG (2000 UT) tablet, Take 2,000 Units by mouth daily. , Disp: , Rfl:  .  clonazePAM (KLONOPIN) 1 MG tablet, Take  1 tablet (1 mg total) by mouth 2 (two) times daily as needed for anxiety., Disp: 60 tablet, Rfl: 1 .  Cyanocobalamin (VITAMIN B 12) 500 MCG TABS, Take 500 mcg by mouth daily. , Disp: , Rfl:  .  DULoxetine (CYMBALTA) 60 MG capsule, Take 1 capsule (60 mg total) by mouth daily after supper., Disp: 90 capsule, Rfl: 0 .  ELDERBERRY PO, Take 1 tablet by mouth as needed (cold symptoms). , Disp: , Rfl:  .  MELATONIN PO, Take 10 mg by mouth at bedtime. , Disp: , Rfl:  .  Multiple Vitamin (MULTI-VITAMIN) tablet, Take 1 tablet by mouth daily. , Disp: , Rfl:  .  Omega-3 Fatty Acids (FISH OIL) 1200 MG CAPS, Take 1,200 mg by mouth daily. , Disp: , Rfl:  .  OVER THE COUNTER MEDICATION, Take 1 capsule by mouth in the morning and at bedtime. RSP Conjugated Linoleic Acid (CLA)    (weight loss) , Disp: , Rfl:  .  Oxcarbazepine (TRILEPTAL) 300 MG tablet, Take 1  tablet (300 mg total) by mouth 2 (two) times daily., Disp: 180 tablet, Rfl: 0 .  Zinc 22.5 MG TABS, Take by mouth., Disp: , Rfl:  .  HYDROcodone-acetaminophen (NORCO/VICODIN) 5-325 MG tablet, Take 1 tablet by mouth every 6 (six) hours as needed for moderate pain. May take with ibuprofen (Patient not taking: Reported on 01/23/2020), Disp: 10 tablet, Rfl: 0 .  methylphenidate (RITALIN LA) 30 MG 24 hr capsule, Take 1 capsule (30 mg total) by mouth every morning., Disp: 30 capsule, Rfl: 0 .  methylphenidate (RITALIN LA) 30 MG 24 hr capsule, Take 1 capsule (30 mg total) by mouth every morning., Disp: 30 capsule, Rfl: 0 .  methylphenidate (RITALIN LA) 30 MG 24 hr capsule, Take 1 capsule (30 mg total) by mouth every morning. (Patient not taking: Reported on 01/23/2020), Disp: 30 capsule, Rfl: 0 .  [START ON 02/06/2020] methylphenidate (RITALIN LA) 30 MG 24 hr capsule, Take 1 capsule (30 mg total) by mouth every morning. (Patient not taking: Reported on 01/23/2020), Disp: 30 capsule, Rfl: 0 .  milk thistle 175 MG tablet, Take 175 mg by mouth daily. (Patient not taking: Reported on 01/23/2020), Disp: , Rfl:    Social History: Reviewed -  reports that she has quit smoking. Her smoking use included cigarettes. She started smoking about 12 years ago. She quit after 0.50 years of use. She has never used smokeless tobacco.  Objective Findings:  Vitals: Blood pressure 117/73, pulse 82, height 4\' 11"  (1.499 m), weight 209 lb 9.6 oz (95.1 kg).  PHYSICAL EXAMINATION General appearance - alert, well appearing, and in no distress, oriented to person, place, and time and overweight Mental status - alert, oriented to person, place, and time, normal mood, behavior, speech, dress, motor activity, and thought processes, affect appropriate to mood  PELVIC Cervix - anterior ; speculum must be inserted upside down to see the very anterior cervix. Uterus - IUD in place string visible 2 cm length  Assessment & Plan:   A:   1.  Paragard IUD positioned in uterus  P:  1.  Follow up prn  By signing my name below, I, , attest that this documentation has been prepared under the direction and in the presence of Pietro Cassis, MD. Electronically Signed: Tilda Burrow, Medical Scribe. 01/23/20. 10:41 AM.  I personally performed the services described in this documentation, which was SCRIBED in my presence. The recorded information has been reviewed and considered accurate. It has  been edited as necessary during review. Jonnie Kind, MD

## 2020-01-24 ENCOUNTER — Other Ambulatory Visit: Payer: Self-pay

## 2020-01-24 ENCOUNTER — Telehealth (INDEPENDENT_AMBULATORY_CARE_PROVIDER_SITE_OTHER): Payer: Medicaid Other | Admitting: Psychiatry

## 2020-01-24 DIAGNOSIS — F902 Attention-deficit hyperactivity disorder, combined type: Secondary | ICD-10-CM

## 2020-01-24 DIAGNOSIS — F4312 Post-traumatic stress disorder, chronic: Secondary | ICD-10-CM

## 2020-01-24 DIAGNOSIS — F422 Mixed obsessional thoughts and acts: Secondary | ICD-10-CM

## 2020-01-24 DIAGNOSIS — F3161 Bipolar disorder, current episode mixed, mild: Secondary | ICD-10-CM

## 2020-01-24 DIAGNOSIS — F603 Borderline personality disorder: Secondary | ICD-10-CM

## 2020-01-24 MED ORDER — METHYLPHENIDATE HCL ER (LA) 40 MG PO CP24
40.0000 mg | ORAL_CAPSULE | ORAL | 0 refills | Status: DC
Start: 2020-02-23 — End: 2020-03-22

## 2020-01-24 MED ORDER — CLONAZEPAM 1 MG PO TABS: 1.0000 mg | ORAL_TABLET | Freq: Two times a day (BID) | ORAL | 1 refills | Status: DC | PRN

## 2020-01-24 MED ORDER — METHYLPHENIDATE HCL ER (LA) 40 MG PO CP24
40.0000 mg | ORAL_CAPSULE | ORAL | 0 refills | Status: DC
Start: 2020-01-24 — End: 2020-03-22

## 2020-01-24 MED ORDER — METHYLPHENIDATE HCL ER (LA) 40 MG PO CP24
40.0000 mg | ORAL_CAPSULE | ORAL | 0 refills | Status: DC
Start: 2020-03-24 — End: 2020-03-22

## 2020-01-24 MED ORDER — DULOXETINE HCL 30 MG PO CPEP: 90.0000 mg | ORAL_CAPSULE | Freq: Every day | ORAL | 0 refills | Status: DC

## 2020-01-24 MED ORDER — VRAYLAR 4.5 MG PO CAPS: 4.5000 mg | ORAL_CAPSULE | Freq: Every day | ORAL | 0 refills | Status: DC
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
## 2020-01-26 ENCOUNTER — Encounter (HOSPITAL_COMMUNITY): Payer: Self-pay | Admitting: Emergency Medicine

## 2020-01-26 ENCOUNTER — Other Ambulatory Visit: Payer: Self-pay

## 2020-01-26 ENCOUNTER — Ambulatory Visit (HOSPITAL_COMMUNITY)
Admission: EM | Admit: 2020-01-26 | Discharge: 2020-01-26 | Disposition: A | Discharge status: Home / Self Care | Payer: Medicaid Other | Attending: Internal Medicine | Admitting: Internal Medicine

## 2020-01-26 ENCOUNTER — Ambulatory Visit (INDEPENDENT_AMBULATORY_CARE_PROVIDER_SITE_OTHER): Payer: Medicaid Other | Admitting: Licensed Clinical Social Worker

## 2020-01-26 DIAGNOSIS — M797 Fibromyalgia: Secondary | ICD-10-CM | POA: Diagnosis not present

## 2020-01-26 DIAGNOSIS — Z87891 Personal history of nicotine dependence: Secondary | ICD-10-CM | POA: Diagnosis not present

## 2020-01-26 DIAGNOSIS — Z79899 Other long term (current) drug therapy: Secondary | ICD-10-CM | POA: Insufficient documentation

## 2020-01-26 DIAGNOSIS — F319 Bipolar disorder, unspecified: Secondary | ICD-10-CM | POA: Insufficient documentation

## 2020-01-26 DIAGNOSIS — Z20822 Contact with and (suspected) exposure to covid-19: Secondary | ICD-10-CM | POA: Diagnosis not present

## 2020-01-26 DIAGNOSIS — J020 Streptococcal pharyngitis: Secondary | ICD-10-CM | POA: Diagnosis not present

## 2020-01-26 DIAGNOSIS — F4310 Post-traumatic stress disorder, unspecified: Secondary | ICD-10-CM

## 2020-01-26 DIAGNOSIS — J029 Acute pharyngitis, unspecified: Secondary | ICD-10-CM | POA: Diagnosis present

## 2020-01-26 DIAGNOSIS — F902 Attention-deficit hyperactivity disorder, combined type: Secondary | ICD-10-CM

## 2020-01-26 DIAGNOSIS — F603 Borderline personality disorder: Secondary | ICD-10-CM | POA: Insufficient documentation

## 2020-01-26 DIAGNOSIS — F3161 Bipolar disorder, current episode mixed, mild: Secondary | ICD-10-CM

## 2020-01-26 LAB — POCT RAPID STREP A: Streptococcus, Group A Screen (Direct): POSITIVE — AB

## 2020-01-26 MED ORDER — AMOXICILLIN 500 MG PO CAPS
500.0000 mg | ORAL_CAPSULE | Freq: Two times a day (BID) | ORAL | 0 refills | Status: AC
Start: 2020-01-26 — End: 2020-02-05

## 2020-01-26 MED ORDER — IBUPROFEN 800 MG PO TABS
800.0000 mg | ORAL_TABLET | Freq: Three times a day (TID) | ORAL | 0 refills | Status: DC
Start: 2020-01-26 — End: 2020-10-17

## 2020-01-26 NOTE — Progress Notes (Signed)
Virtual Visit via Video Note  I connected withChristine Sharpeon6/17/21at11:00ambyvideo enabled telemedicine applicationand verified that I am speaking with the correct person using two identifiers.  I discussed the limitations, risks, security and privacy concerns of performing an evaluation and management service by telephone and the availability of in person appointments. I [name] discussed with the patient that there may be a patient responsible charge related to this service. The patient expressed understanding and agreed to proceed.  I discussed the assessment and treatment plan with the patient. The patient was provided an opportunity to ask questions and all were answered. The patient agreed with the plan and demonstrated an understanding of the instructions.  The patient was advised to call back or seek an in-person evaluation if the symptoms worsen or if the condition fails to improve as anticipated.  I provided30 minutesof non-face-to-face time during this encounter.   Courtney Flood, LCSW, LCAS ________________________ THERAPIST PROGRESS NOTE  Session Time:11:00am - 11:30am  Location: Patient: Patient Home Provider: [facility] [facility]   Participation Level:Active  Behavioral Response:Alert, well groomed, casually dressed,depressed affect/mood  Type of Therapy: Individual Therapy  Treatment Goals addressed:Self-care routine; anxiety management;Medication management; Diet and exercise; Communication skills and boundaries  Interventions:CBT  Summary:[name] is a 29 year old female thatpresented for virtual session todayand is diagnosed withBipolar I Disorder, mixed, mild; Borderline personality disorder; PTSD;andADHD, combined type.  Suicidal/Homicidal:None;without intent or plan  Therapist Response:Clinician spoke with Courtney [name] for virtual therapy session today.  Clinician assessed for safety, sobriety, and medication  compliance.  Courtney [name] presented for appointment on time and was alert, oriented x5, with no evidence or self-report of SI/HI or A/V H.  Courtney [name] [name] ongoing compliance with medication regimen.  Courtney [name] [name] that she continues to use Delta 8 sparingly 2-3x per week.  Clinician inquired about Courtney [name]'s emotional ratings today, as well as any significant changes in thoughts, feelings, or behavior since previous check-in.  Courtney [name] [name] scores of 0/10 for depression, anxiety, and mania today, stating "I have had a sore throat that [name] gotten worse since yesterday and made it hard to sleep, so I'm tired today".  She denied any reoccurrence in past week of panic attacks and notes that relaxation techniques continue to be effective in disrupting onset of episodes.  Clinician inquired about whether Courtney [name] is experiencing any symptoms suggestive of COVID, and whether she [name] outreached her doctor to be assessed.  Courtney [name] [name] that sore throat and fatigue are her only symptoms at this time and she received both doses of COVID vaccine, so she does not think she is positive.  Clinician encouraged Courtney [name] to monitor symptoms carefully and seek medical assistance if condition worsens further and health appears to be at risk, which Courtney [name] was agreeable to.  Clinician inquired about progress Courtney [name] made in past week towards goals, as well as present challenges to success.  Courtney [name] [name] that she spoke with psychiatrist 2 days ago about her medications, and he increased her Ritalin and Cymbalta to assist with symptom reduction.  She [name] that she [name] been engaging in more self-care throughout the week, such as meditating, reading, and watching TV.  She [name] that she plans to make a pot roast for fathers day and visit family tomorrow.  Courtney [name] [name] that one issue she is facing is making time to exercise, and acknowledged that she gets distracted on social medica looking at  her phone.  Clinician explored solutions to address this imbalance, including applications that could be downloaded on her phone to limit  screentime if she cannot exhibit manual self-control herself.  Courtney [name], and they have gone to the movies and on dates, but she is trying to take things slow.  Clinician reminded Courtney [name] that she had originally [name] that she felt it was a bad idea to consider entering into a [name] romantic relationship due to recurring issues she faced in past ones, and need to focus on herself during ongoing therapy.  Interventions were effective, as evidenced by Courtney [name] reporting that this session gave her ideas on how to increase productivity towards goals throughout the week by limiting phone use, and reminded her to continue monitoring for red flag behaviors as she meets [name] people and expands social support.  Courtney [name] appeared to grow more fatigued as session wore on and asked to end session early, noting that her throat was starting to hurt more from talking.  Clinician was supportive of Courtney [name]'s request, encouraged her to get adequate rest today, and will continue to monitor.     Plan:Meet again in 1 week virtually.   Diagnosis:Bipolar I Disorder, mixed, mild; Borderline personality disorder; PTSD; ADHD, combined type.  Courtney Stain, LCSW, LCAS 01/26/20

## 2020-01-26 NOTE — Discharge Instructions (Signed)
Sore Throat  °Your rapid strep test was positive today. We will treat you for strep throat with an antibiotic. Please take Amoxicillin as prescribed.  ° °Please continue Tylenol or Ibuprofen for fever and pain. May try salt water gargles, cepacol lozenges, throat spray, or OTC cold relief medicine for throat discomfort. If you also have congestion take a daily anti-histamine like Zyrtec, Claritin, and a oral decongestant to help with post nasal drip that may be irritating your throat.  ° °Stay hydrated and drink plenty of fluids to keep your throat coated relieve irritation.  °

## 2020-01-26 NOTE — ED Provider Notes (Signed)
[facility]    CSN: [id number] Arrival date & time: 01/26/20  1536      History   Chief Complaint Chief Complaint  Patient presents with   Sore Throat    HPI Courtney [name] is a 29 y.o. female presenting today for evaluation of sore throat.  Patient reports that she had a sore throat beginning yesterday.  She denies any other significant symptoms besides feeling slightly fatigued.  Denies rhinorrhea, denies cough.  Denies chest pain or shortness of breath.  Has had decreased appetite due to sore throat.  Denies any nausea vomiting or diarrhea.  Fully Covid vaccinated.  Denies any known close sick contacts.  HPI  Past Medical History:  Diagnosis Date   ADHD    As a child   Anxiety    Bipolar 1 disorder (HCC)    Cervical kyphosis    Depression    Fatty liver    Febrile seizure (HCC)    as child, only one, was never put on meds.   Fibromyalgia    MVP (mitral valve prolapse)    OCD (obsessive compulsive disorder)    Personality disorder (HCC)    PTSD (post-traumatic stress disorder)    Pyloric stenosis     Patient Active Problem List   Diagnosis Date Noted   Acute bilateral low back pain 09/26/2019   Episodic confusion 09/14/2019   Fibromyalgia syndrome 09/14/2019   Menstrual cramps 09/12/2019   GERD (gastroesophageal reflux disease) 09/12/2019   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   Fatty liver    Encounter for insertion of copper intrauterine contraceptive device (IUD) 07/19/2019   Decreased libido 07/19/2019   Vaginal dryness 07/19/2019   Labial hypertrophy 07/19/2019   Memory loss or impairment 05/04/2019   Numbness and tingling of both feet 04/28/2019   Bilateral hand numbness 04/28/2019   Borderline personality disorder (HCC) 04/08/2019   Bipolar 1 disorder, mixed, mild (HCC) 03/21/2019   Tobacco use disorder 03/21/2019   Bipolar disorder (HCC) 11/21/2018   Cannabis use disorder, mild, abuse    B12 deficiency  10/11/2018   Vitamin D deficiency 10/11/2018   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    Rectal polyp    Severe anxiety 03/19/2018   Chronic post-traumatic stress disorder (PTSD)    OCD (obsessive compulsive disorder) 12/31/2017   Attention deficit hyperactivity disorder (ADHD), combined type 12/31/2017   Prediabetes 12/31/2017    Past Surgical History:  Procedure Laterality Date   APPENDECTOMY  02/17/2014   COLONOSCOPY WITH PROPOFOL N/A 09/03/2018   Procedure: COLONOSCOPY WITH PROPOFOL;  Surgeon: Pasty Spillers, MD;  Location: [facility];  Service: Endoscopy;  Laterality: N/A;   ESOPHAGOGASTRODUODENOSCOPY (EGD) WITH PROPOFOL N/A 09/03/2018   Procedure: ESOPHAGOGASTRODUODENOSCOPY (EGD) WITH PROPOFOL;  Surgeon: Pasty Spillers, MD;  Location: [facility];  Service: Endoscopy;  Laterality: N/A;   EUS N/A 10/14/2018   Procedure: FULL UPPER ENDOSCOPIC ULTRASOUND (EUS) RADIAL;  Surgeon: Rayann Heman, MD;  Location: [facility];  Service: Endoscopy;  Laterality: N/A;   INTRAUTERINE DEVICE (IUD) INSERTION N/A 11/15/2019   Procedure: INTRAUTERINE DEVICE (IUD) INSERTION (PARA GARD);  Surgeon: Tilda Burrow, MD;  Location: [facility];  Service: Gynecology;  Laterality: N/A;   LABIOPLASTY Bilateral 11/15/2019   Procedure: BILATERAL REDUCTION LABIAPLASTY;  Surgeon: Tilda Burrow, MD;  Location: [facility];  Service: Gynecology;  Laterality: Bilateral;   pyloric stenosis repair      OB History    Gravida  0   Para  0   Term  0  Preterm  0   AB  0   Living  0     SAB  0   TAB  0   Ectopic  0   Multiple  0   Live Births  0            Home Medications    Prior to Admission medications   Medication Sig Start Date End Date Taking? Authorizing Provider  amoxicillin (AMOXIL) 500 MG capsule Take 1 capsule (500 mg total) by mouth 2 (two) times daily for 10 days. 01/26/20 02/05/20  Carneshia Raker C, PA-C  Ascorbic Acid (VITAMIN C) 1000 MG tablet Take  1,000 mg by mouth daily.    [provider]  BIOTIN 5000 PO Take by mouth.    [provider]  Cariprazine HCl (VRAYLAR) 4.5 MG CAPS Take 1 capsule (4.5 mg total) by mouth daily at 8 pm. 02/06/20 05/06/20  Pucilowski, Roosvelt Maser, MD  Cholecalciferol (VITAMIN D) 50 MCG (2000 UT) tablet Take 2,000 Units by mouth daily.     [provider]  clonazePAM (KLONOPIN) 1 MG tablet Take 1 tablet (1 mg total) by mouth 2 (two) times daily as needed for anxiety. 02/06/20 04/06/20  Pucilowski, Roosvelt Maser, MD  Cyanocobalamin (VITAMIN B 12) 500 MCG TABS Take 500 mcg by mouth daily.     [provider]  DULoxetine (CYMBALTA) 30 MG capsule Take 3 capsules (90 mg total) by mouth daily after supper. 01/24/20 04/23/20  Pucilowski, Roosvelt Maser, MD  ELDERBERRY PO Take 1 tablet by mouth as needed (cold symptoms).     [provider]  ibuprofen (ADVIL) 800 MG tablet Take 1 tablet (800 mg total) by mouth 3 (three) times daily. 01/26/20   Quanah Majka C, PA-C  MELATONIN PO Take 10 mg by mouth at bedtime.     [provider]  methylphenidate (RITALIN LA) 40 MG 24 hr capsule Take 1 capsule (40 mg total) by mouth every morning. 01/24/20 02/23/20  Pucilowski, Roosvelt Maser, MD  methylphenidate (RITALIN LA) 40 MG 24 hr capsule Take 1 capsule (40 mg total) by mouth every morning. 02/23/20 03/24/20  Pucilowski, Roosvelt Maser, MD  methylphenidate (RITALIN LA) 40 MG 24 hr capsule Take 1 capsule (40 mg total) by mouth every morning. 03/24/20 04/23/20  Pucilowski, Roosvelt Maser, MD  milk thistle 175 MG tablet Take 175 mg by mouth daily. Patient not taking: Reported on 01/23/2020    [provider]  Multiple Vitamin (MULTI-VITAMIN) tablet Take 1 tablet by mouth daily.     [provider]  Omega-3 Fatty Acids (FISH OIL) 1200 MG CAPS Take 1,200 mg by mouth daily.     [provider]  OVER THE COUNTER MEDICATION Take 1 capsule by mouth in the morning and at bedtime. RSP Conjugated  Linoleic Acid (CLA)    (weight loss)     [provider]  Oxcarbazepine (TRILEPTAL) 300 MG tablet Take 1 tablet (300 mg total) by mouth 2 (two) times daily. 12/08/19 03/07/20  Pucilowski, Roosvelt Maser, MD  Zinc 22.5 MG TABS Take by mouth.    [provider]    Family History Family History  Problem Relation Age of Onset   COPD Mother    Hypertension Mother    Asthma Mother    Arthritis Mother    Pancreatic cancer Mother        slow growing   ADD / ADHD Mother    Other Mother        Spinal Stenosis -  currently in surgery today   Bipolar disorder Mother    Anxiety disorder Mother    Depression Mother    Parkinson's disease Mother    GER disease Mother    Asthma Brother    Hernia Brother    ADD / ADHD Brother    Bipolar disorder Brother    Hypertension Maternal Grandmother    Heart disease Maternal Grandmother 27   Rheumatic fever Maternal Grandmother    Heart attack Maternal Grandmother        Bypass Surgery   Pancreatic cancer Maternal Grandfather    Liver cancer Maternal Grandfather    Asthma Maternal Grandfather    Obesity Paternal Grandmother     Social History Social History   Tobacco Use   Smoking status: Former Smoker    Years: 0.50    Types: Cigarettes    Start date: 01/01/2008   Smokeless tobacco: Never Used   Tobacco comment: Hookah only smoked at get togethers  Vaping Use   Vaping Use: Former   Substances: THC  Substance Use Topics   Alcohol use: Not Currently   Drug use: Not Currently    Types: Marijuana    Comment: 2015 last cocaine use.     Allergies   Vyvanse [lisdexamfetamine]   Review of Systems Review of Systems  Constitutional: Negative for activity change, appetite change, chills, fatigue and fever.  HENT: Positive for sore throat. Negative for congestion, ear pain, rhinorrhea, sinus pressure and trouble swallowing.   Eyes: Negative for discharge and redness.  Respiratory: Negative for  cough, chest tightness and shortness of breath.   Cardiovascular: Negative for chest pain.  Gastrointestinal: Negative for abdominal pain, diarrhea, nausea and vomiting.  Musculoskeletal: Negative for myalgias.  Skin: Negative for rash.  Neurological: Negative for dizziness, light-headedness and headaches.     Physical Exam Triage Vital Signs ED Triage Vitals  Enc Vitals Group     BP 01/26/20 1628 106/66     Pulse Rate 01/26/20 1628 (!) 108     Resp 01/26/20 1628 14     Temp 01/26/20 1628 99.5 F (37.5 C)     Temp Source 01/26/20 1628 Oral     SpO2 01/26/20 1628 98 %     Weight --      Height --      Head Circumference --      Peak Flow --      Pain Score 01/26/20 1631 8     Pain Loc --      Pain Edu? --      Excl. in GC? --    No data found.  Updated Vital Signs BP 106/66 (BP Location: Left Arm)    Pulse (!) 108    Temp 99.5 F (37.5 C) (Oral)    Resp 14    SpO2 98%   Visual Acuity Right Eye Distance:   Left Eye Distance:   Bilateral Distance:    Right Eye Near:   Left Eye Near:    Bilateral Near:     Physical Exam Vitals and nursing note reviewed.  Constitutional:      Appearance: She is well-developed.     Comments: No acute distress  HENT:     Head: Normocephalic and atraumatic.     Ears:     Comments: Bilateral ears without tenderness to palpation of external auricle, tragus and mastoid, EAC's without erythema or swelling, TM's with good bony landmarks and cone of light. Non erythematous.     Nose: Nose normal.  Mouth/Throat:     Comments: Oral mucosa pink and moist, bilateral tonsils enlarged, erythematous and with small amount of exudate, no soft palate swelling. Posterior pharynx patent and nonerythematous, no uvula deviation or swelling. Normal phonation. Eyes:     Conjunctiva/sclera: Conjunctivae normal.  Cardiovascular:     Rate and Rhythm: Normal rate.  Pulmonary:     Effort: Pulmonary effort is normal. No respiratory distress.      Comments: Breathing comfortably at rest, CTABL, no wheezing, rales or other adventitious sounds auscultated Abdominal:     General: There is no distension.  Musculoskeletal:        General: Normal range of motion.     Cervical back: Neck supple.  Skin:    General: Skin is warm and dry.  Neurological:     Mental Status: She is alert and oriented to person, place, and time.      UC Treatments / Results  Labs (all labs ordered are listed, but only abnormal results are displayed) Labs Reviewed  POCT RAPID STREP A - Abnormal; Notable for the following components:      Result Value   Streptococcus, Group A Screen (Direct) POSITIVE (*)    All other components within normal limits  SARS CORONAVIRUS 2 (TAT 6-24 HRS)    EKG   Radiology No results found.  Procedures Procedures (including critical care time)  Medications Ordered in UC Medications - No data to display  Initial Impression / Assessment and Plan / UC Course  I have reviewed the triage vital signs and the nursing notes.  Pertinent labs & imaging results that were available during my care of the patient were reviewed by me and considered in my medical decision making (see chart for details).    Strep test positive, treating with amoxicillin twice daily x10 days, continue symptomatic and supportive care.  Covid pending.  Rest and fluids.  No sign of peritonsillar abscess at this time.  Discussed strict return precautions. Patient verbalized understanding and is agreeable with plan.  Final Clinical Impressions(s) / UC Diagnoses   Final diagnoses:  Strep pharyngitis     Discharge Instructions     Sore Throat  Your rapid strep test was positive today. We will treat you for strep throat with an antibiotic. Please take Amoxicillin as prescribed.   Please continue Tylenol or Ibuprofen for fever and pain. May try salt water gargles, cepacol lozenges, throat spray, or OTC cold relief medicine for throat discomfort. If  you also have congestion take a daily anti-histamine like Zyrtec, Claritin, and a oral decongestant to help with post nasal drip that may be irritating your throat.   Stay hydrated and drink plenty of fluids to keep your throat coated relieve irritation.     ED Prescriptions    Medication Sig Dispense Auth. Provider   amoxicillin (AMOXIL) 500 MG capsule Take 1 capsule (500 mg total) by mouth 2 (two) times daily for 10 days. 20 capsule Brooklyn Alfredo C, PA-C   ibuprofen (ADVIL) 800 MG tablet Take 1 tablet (800 mg total) by mouth 3 (three) times daily. 21 tablet Volanda Mangine, [place] C, PA-C     PDMP not reviewed this encounter.   Janith Lima, PA-C 01/26/20 1702

## 2020-01-26 NOTE — ED Triage Notes (Signed)
Patient presents to urgent care today with symptoms of sore throat. Symptoms began on yesterday. They have tried throat lozenges with some relief of symptoms. Pt has had covid vaccine but is requesting a covid test.

## 2020-01-27 LAB — SARS CORONAVIRUS 2 (TAT 6-24 HRS): SARS Coronavirus 2: NEGATIVE

## 2020-02-02 ENCOUNTER — Ambulatory Visit (HOSPITAL_COMMUNITY): Payer: Medicaid Other | Admitting: Licensed Clinical Social Worker

## 2020-02-08 ENCOUNTER — Telehealth (HOSPITAL_COMMUNITY): Payer: Medicaid Other | Admitting: Psychiatry

## 2020-02-09 ENCOUNTER — Ambulatory Visit (INDEPENDENT_AMBULATORY_CARE_PROVIDER_SITE_OTHER): Payer: Medicaid Other | Admitting: Licensed Clinical Social Worker

## 2020-02-09 ENCOUNTER — Other Ambulatory Visit: Payer: Self-pay

## 2020-02-09 DIAGNOSIS — F4310 Post-traumatic stress disorder, unspecified: Secondary | ICD-10-CM

## 2020-02-09 DIAGNOSIS — F603 Borderline personality disorder: Secondary | ICD-10-CM | POA: Diagnosis not present

## 2020-02-09 DIAGNOSIS — F902 Attention-deficit hyperactivity disorder, combined type: Secondary | ICD-10-CM

## 2020-02-09 DIAGNOSIS — F3161 Bipolar disorder, current episode mixed, mild: Secondary | ICD-10-CM | POA: Diagnosis not present
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
## 2020-02-16 ENCOUNTER — Ambulatory Visit (INDEPENDENT_AMBULATORY_CARE_PROVIDER_SITE_OTHER): Payer: Medicaid Other | Admitting: Licensed Clinical Social Worker

## 2020-02-16 ENCOUNTER — Other Ambulatory Visit: Payer: Self-pay

## 2020-02-16 DIAGNOSIS — F3161 Bipolar disorder, current episode mixed, mild: Secondary | ICD-10-CM | POA: Diagnosis not present

## 2020-02-16 DIAGNOSIS — F902 Attention-deficit hyperactivity disorder, combined type: Secondary | ICD-10-CM

## 2020-02-16 DIAGNOSIS — F603 Borderline personality disorder: Secondary | ICD-10-CM

## 2020-02-16 DIAGNOSIS — F4310 Post-traumatic stress disorder, unspecified: Secondary | ICD-10-CM

## 2020-02-16 NOTE — Progress Notes (Signed)
Virtual Visit via Video Note   I connected with Courtney [name] on 02/16/20 at 11:00am by video enabled telemedicine application and verified that I am speaking with the correct person using two identifiers.   I discussed the limitations, risks, security and privacy concerns of performing an evaluation and management service by telephone and the availability of in person appointments. I also discussed with the patient that there may be Courtney patient responsible charge related to this service. The patient expressed understanding and agreed to proceed.   I discussed the assessment and treatment plan with the patient. The patient was provided an opportunity to ask questions and all were answered. The patient agreed with the plan and demonstrated an understanding of the instructions.   The patient was advised to call back or seek an in-person evaluation if the symptoms worsen or if the condition fails to improve as anticipated.   I provided 1 hour of non-face-to-face time during this encounter.     [name], LCSW, LCAS ________________________ THERAPIST PROGRESS NOTE   Session Time: 11:00am - 12:00pm  Location: Patient: Patient Home Provider: [facility]    Participation Level: Active   Behavioral Response: Alert, well groomed, casually dressed, euthymic affect/mood   Type of Therapy:  Individual Therapy   Treatment Goals addressed: Self-care routine; anxiety management; Medication management; Communication skills and boundaries   Interventions: CBT, healthy communication and boundaries, solution focused   Summary: Courtney [name] is Courtney 29 year old female that presented for virtual session today and is diagnosed with Bipolar I Disorder, mixed, mild; Borderline personality disorder; PTSD; and ADHD, combined type.        Suicidal/Homicidal: None; without intent or plan   Therapist Response: Clinician spoke with Courtney [name] for virtual therapy session and assessed for safety, sobriety, and  medication compliance.  Courtney [name] presented for appointment on time and was alert, oriented x5, with no evidence or self-report of SI/HI or Courtney/V H.  Courtney [name] reported ongoing compliance with medication and reported using Delta 8 (2-3x) per week to aid in stress relief.  Clinician inquired about Courtney [name]'s emotional ratings today, as well as any significant changes in thoughts, feelings, or behavior since previous check-in.  Courtney [name] reported scores of 0/10 for depression, anxiety, and mania today, stating "I'm pretty mellowed out now that my period is over".  Clinician inquired about progress Courtney [name] has made towards goals in past week, as well as present challenges.  Courtney [name] reported that she has been focused on increasing focus towards self-care again, including daily mindful meditation, making jewelry, as well as working with her fianc at their business throughout the week, which is helping her improve social skills, improve financial stability, and strengthening their relationship.  Courtney [name] reported that her primary issue at this time has been dealing with stress regarding transition out of her grandma's house, as her belongings are being held for ransom unless she pays extra rent.  Clinician explored solutions with Courtney [name] that could assist in resolving this issue, including seeking legal assistance if necessary from law enforcement or pushing the matter to small claims court.  Interventions were effective, as evidenced by Courtney [name] reporting that this session allowed her to get some things off her chest which have been worrying her, increased insight into present issues, and provided her with ideas on how to approach this stressor so that she can get her belongings back.  Courtney [name] stated "These sessions always help me feel like I'm not crazy".  Clinician encouraged Courtney [name] to continue setting aside time for  self-care and reinforce boundaries with people in her life deemed not supportive of her  mental health based upon patterns of negative behavior. Clinician will continue to monitor.                        Plan: Meet again in 1 week virtually.   Diagnosis: Bipolar I Disorder, mixed, mild; Borderline personality disorder; PTSD; ADHD, combined type.   [name], LCSW, LCAS 02/16/20

## 2020-02-20 ENCOUNTER — Encounter: Payer: Medicaid Other | Admitting: Family Medicine

## 2020-02-23 ENCOUNTER — Other Ambulatory Visit: Payer: Self-pay

## 2020-02-23 ENCOUNTER — Ambulatory Visit (INDEPENDENT_AMBULATORY_CARE_PROVIDER_SITE_OTHER): Payer: Medicaid Other | Admitting: Licensed Clinical Social Worker

## 2020-02-23 DIAGNOSIS — F902 Attention-deficit hyperactivity disorder, combined type: Secondary | ICD-10-CM | POA: Diagnosis not present

## 2020-02-23 DIAGNOSIS — F3161 Bipolar disorder, current episode mixed, mild: Secondary | ICD-10-CM | POA: Diagnosis not present

## 2020-02-23 DIAGNOSIS — F603 Borderline personality disorder: Secondary | ICD-10-CM | POA: Diagnosis not present

## 2020-02-23 DIAGNOSIS — F4310 Post-traumatic stress disorder, unspecified: Secondary | ICD-10-CM

## 2020-02-23 NOTE — Progress Notes (Signed)
Virtual Visit via Video Note   I connected with Courtney [name] on 02/23/20 at 11:00am by video enabled telemedicine application and verified that I am speaking with the correct person using two identifiers.   I discussed the limitations, risks, security and privacy concerns of performing an evaluation and management service by telephone and the availability of in person appointments. I also discussed with the patient that there may be a patient responsible charge related to this service. The patient expressed understanding and agreed to proceed.   I discussed the assessment and treatment plan with the patient. The patient was provided an opportunity to ask questions and all were answered. The patient agreed with the plan and demonstrated an understanding of the instructions.   The patient was advised to call back or seek an in-person evaluation if the symptoms worsen or if the condition fails to improve as anticipated.   I provided 1 hour of non-face-to-face time during this encounter.     Courtney Stain, LCSW, LCAS ________________________ THERAPIST PROGRESS NOTE   Session Time: 11:00am - 12:00pm  Location: Patient: Patient Home Provider: [facility]    Participation Level: Active   Behavioral Response: Alert, well groomed, casually dressed, depressed affect/mood   Type of Therapy:  Individual Therapy   Treatment Goals addressed: Self-care routine; anxiety management; Medication management; Communication skills and boundaries   Interventions: CBT, healthy communication and boundaries   Summary: Courtney [name] is a 29 year old female that presented for virtual session today and is diagnosed with Bipolar I Disorder, mixed, mild; Borderline personality disorder; PTSD; and ADHD, combined type.        Suicidal/Homicidal: None; without intent or plan   Therapist Response: Clinician spoke with Courtney [name] for virtual therapy appointment and assessed for safety, sobriety, and medication  compliance.  Courtney [name] presented for session on time and was alert, oriented x5, with no evidence or self-report of SI/HI or A/V H.  Courtney [name] reported that she continues taking medication as prescribed and limiting use of Delta 8 to (2-3x) per week.  Courtney [name] reported that she has begun to notice muscle twitches occurring more frequently in past 3 months and plans to speak with psychiatrist about this due to worry that it might be early signs of tardive dyskinesia linked to medication.  Clinician inquired about Courtney [name]'s current emotional ratings, as well as any significant changes in thoughts, feelings, or behavior since last check-in.  Courtney [name] reported scores of 6/10 for both depression and anxiety, but denied presence of manic symptoms and stated "Me and my fianc are not together anymore".  Clinician inquired about what led up to this separation, and how Courtney [name] has been handling this.  Courtney [name] reported that yesterday her fianc became unreasonably upset when she suggested that he shouldn't drink, and abruptly kicked her out of the home, leaving her to desperately make calls to friends to avoid sleeping outside and losing her property.  Courtney [name] stated "There were signs that getting back with him wasn't a good idea and everyone called it, but I was trying to focus on the good things instead".  Clinician empathized with Courtney [name] regarding this difficult event, and revisited topic of 'red flag behaviors' to watch out for in future relationships, including examples like gaslighting, blaming partners for outbursts, destroying property in retaliation, cutting off social support, making threats of violence, or having unpredictable temper/mood swings.  Courtney [name] reported that her partner had initially appeared to be doing better and working towards resolving negative traits, but stated "I think pressure  built up and spilled over.  He must have been holding all of this in".  Clinician also inquired about how  Courtney [name] intends to manage boundaries with this individual now that there have been repeat instances where unacceptable behavior has been displayed.  Courtney [name] stated "Its over between Courtney [name] now.  If he reaches out again, I'm not going to respond and will block him on social media.  I can't keep trying to save him.  I need to focus on myself".  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  Clinician was supportive of Courtney [name] redirecting focus towards her own wellbeing, and inquired about her self care routine for following week to lift mood.  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  Interventions were effective, as evidenced by Courtney [name] reporting that processing this separation event allowed her time to reflect upon the impact relationships can have upon her mental health, increased insight into tendency to revisit toxic relaitonships, and importance of maintaining boundaries with former partner moving ahead.  Clinician will continue to monitor.                        Plan: Meet again in 1 week virtually.   Diagnosis: Bipolar I Disorder, mixed, mild; Borderline personality disorder; PTSD; ADHD, combined type.   Courtney Stain, LCSW, LCAS 02/23/20

## 2020-02-24 ENCOUNTER — Other Ambulatory Visit (HOSPITAL_COMMUNITY): Payer: Self-pay | Admitting: *Deleted

## 2020-02-24 ENCOUNTER — Telehealth (HOSPITAL_COMMUNITY): Payer: Self-pay | Admitting: *Deleted

## 2020-02-24 ENCOUNTER — Other Ambulatory Visit (HOSPITAL_COMMUNITY): Payer: Self-pay | Admitting: Psychiatry

## 2020-02-24 MED ORDER — OXCARBAZEPINE 300 MG PO TABS: 300.0000 mg | ORAL_TABLET | Freq: Two times a day (BID) | ORAL | 0 refills | Status: DC

## 2020-02-24 MED ORDER — CARIPRAZINE HCL 3 MG PO CAPS: 3.0000 mg | ORAL_CAPSULE | Freq: Every day | ORAL | 2 refills | Status: DC

## 2020-02-24 MED ORDER — CARIPRAZINE HCL 3 MG PO CAPS: 3.0000 mg | ORAL_CAPSULE | Freq: Every day | ORAL | 0 refills | Status: DC

## 2020-02-24 NOTE — Telephone Encounter (Signed)
Pt called c/o muscle "twitching" for approximately one month but fears it is getting worse. Pt has d/c the Ritalin due to "chest tightness" when taking. Pt is afraid she has TD. Please review and advise.

## 2020-02-24 NOTE — Telephone Encounter (Signed)
This sounds like acute dystonia not TD. The only medication among ones she takes which may cause such problems is Vraylar (at higher dose) and I think we should decrease dose to 3 mg. I ordered new dose Rx but prehaps she should get a 1-2 week sample from [name] before she picks Rx to see if dose decrease will help with these symptoms.

## 2020-03-01 ENCOUNTER — Other Ambulatory Visit: Payer: Self-pay

## 2020-03-01 ENCOUNTER — Ambulatory Visit (INDEPENDENT_AMBULATORY_CARE_PROVIDER_SITE_OTHER): Payer: Medicaid Other | Admitting: Licensed Clinical Social Worker

## 2020-03-01 DIAGNOSIS — F902 Attention-deficit hyperactivity disorder, combined type: Secondary | ICD-10-CM

## 2020-03-01 DIAGNOSIS — F4310 Post-traumatic stress disorder, unspecified: Secondary | ICD-10-CM

## 2020-03-01 DIAGNOSIS — F603 Borderline personality disorder: Secondary | ICD-10-CM | POA: Diagnosis not present

## 2020-03-01 DIAGNOSIS — F3161 Bipolar disorder, current episode mixed, mild: Secondary | ICD-10-CM | POA: Diagnosis not present

## 2020-03-01 NOTE — Progress Notes (Signed)
Virtual Visit via Video Note   I connected with Kaya Pottenger on 03/01/20 at 11:00am by video enabled telemedicine application and verified that I am speaking with the correct person using two identifiers.   I discussed the limitations, risks, security and privacy concerns of performing an evaluation and management service by telephone and the availability of in person appointments. I also discussed with the patient that there may be a patient responsible charge related to this service. The patient expressed understanding and agreed to proceed.   I discussed the assessment and treatment plan with the patient. The patient was provided an opportunity to ask questions and all were answered. The patient agreed with the plan and demonstrated an understanding of the instructions.   The patient was advised to call back or seek an in-person evaluation if the symptoms worsen or if the condition fails to improve as anticipated.   I provided 45 minutes of non-face-to-face time during this encounter.     [name], LCSW, LCAS ________________________ THERAPIST PROGRESS NOTE   Session Time: 11:00am - 11:45am   Location: Patient: Patient Home Provider: [facility]    Participation Level: Active   Behavioral Response: Alert, well groomed, casually dressed, euthymic affect/mood   Type of Therapy:  Individual Therapy   Treatment Goals addressed: Self-care routine; anxiety management; Medication management   Interventions: CBT, gratitude journaling, treatment planning   Summary: Nijah Tejera is a 29 year old female that presented for virtual session today and is diagnosed with Bipolar I Disorder, mixed, mild; Borderline personality disorder; PTSD; and ADHD, combined type.        Suicidal/Homicidal: None; without intent or plan   Therapist Response: Clinician spoke with [name] for virtual therapy session and assessed for safety, sobriety, and medication compliance.  Tykeria presented  for appointment on time and was alert, oriented x5, with no evidence or self-report of SI/HI or A/V H.  Jawanna reported ongoing compliance with medication, but admitted to using Delta 8 once per day prior to meditation practice.  Clinician reminded Wynetta that this product is not FDA approved and warned her to be cautious about use due to potential for unwanted side effects.  Clinician inquired about Yasha's emotional ratings today, as well as any significant changes in thoughts, feelings, or behavior since previous check-in.  Quinnlyn reported scores of 0/10 for both depression and anxiety, and denied any manic symptoms.  Rasheeda also denied experiencing any panic attacks since recent breakup, and stated "I've been putting a lot of focus on wellness by meditating each day, listening to peaceful music, and staying around my supportive friends".  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  Alydia reported that it can be difficult at times to focus on the good, especially when she is experiencing pain from medical conditions.  Clinician suggested practicing gratitude journaling as part of Taniqua's self-care routine to help her shift focus more towards positive aspects of her life, and explored some journal prompts that could be helpful (I.e. 5 ways to show gratitude for supports, 5 skills or abilities one is grateful for, writing 5 things that have changed for the better in past year, etc).  Marieme was receptive to this suggestion and noted that she would practice it, stating "I think that would help me handle things better when stuff goes wrong".  Clinician noted that 90 days have passed  since treatment plan was last updated, and suggested revisiting this today.  Laiyla was agreeable to this, and collaborated to make revisions as follows:  Meet with  clinician virtually once per week for therapy to address ongoing progress and needs; Meet with psychiatrist once per month to address efficacy of medication and make adjustments as needed to regimen and/or dosage; Take medication daily as prescribed to reduce symptoms and improve overall daily functioning; Reduce depression from average severity of 4/10 down to 2/10 in the next 90 days by engaging in positive self-care activities for 2 hours each day, such as listening to music, watching movies, playing video games, doing makeup, cutting hair, and/or reading; Reduce anxiety from average severity of 4/10 down to 2/10 in next 90 days, as well as panic attacks from 0-1 per week via utilization of 3-4 relaxation techniques daily such as mindful breathing, meditation, progressive muscle relaxation, grounding techniques, and/or positive visualizations; Continue working to become more independent in daily activities to increase sense of self-reliance, confidence, and curb dependency on other people; Identify 2-3 issues with communication skills that can be addressed to improve socialization with supports within next 90 days;  Write 1 page at a minimum in journal each day regarding thoughts, feelings, and behaviors that arise, with goal of better understanding mood changes for implementation of appropriate copings skills ahead of time ("I want to observe, but not absorb other people's feelings like empaths do"); Exercise 3-4 times per week for an hour at a minimum to improve both physical and mental well-being; Continue working to establish zero balanced budget within next 60 days in an effort to save money each month, and curb impulsive spending behavior; Monitor Delta 8 use closely to avoid developing dependence and negatively impacting mental health, motivation, and/or other goal progress; Voluntarily seek hospitalization to behavioral health for safety should suicidal thoughts or behaviors reoccur.  Sharniece ended  session early upon receiving repeat phone calls from family member, and agreed to follow up next week.  Clinician will continue to monitor.                        Plan: Meet again in 1 week virtually.   Diagnosis: Bipolar I Disorder, mixed, mild; Borderline personality disorder; PTSD; ADHD, combined type.   [name], LCSW, LCAS 03/01/20

## 2020-03-08 ENCOUNTER — Ambulatory Visit (HOSPITAL_COMMUNITY): Payer: Medicaid Other | Admitting: Licensed Clinical Social Worker

## 2020-03-09 ENCOUNTER — Ambulatory Visit (INDEPENDENT_AMBULATORY_CARE_PROVIDER_SITE_OTHER): Payer: Medicaid Other | Admitting: Licensed Clinical Social Worker

## 2020-03-09 ENCOUNTER — Other Ambulatory Visit: Payer: Self-pay

## 2020-03-09 DIAGNOSIS — F902 Attention-deficit hyperactivity disorder, combined type: Secondary | ICD-10-CM | POA: Diagnosis not present

## 2020-03-09 DIAGNOSIS — F3161 Bipolar disorder, current episode mixed, mild: Secondary | ICD-10-CM | POA: Diagnosis not present

## 2020-03-09 DIAGNOSIS — F4310 Post-traumatic stress disorder, unspecified: Secondary | ICD-10-CM | POA: Diagnosis not present

## 2020-03-09 DIAGNOSIS — F603 Borderline personality disorder: Secondary | ICD-10-CM | POA: Diagnosis not present

## 2020-03-09 NOTE — Progress Notes (Signed)
Virtual Visit via Video Note   I connected with Courtney [name] on 03/09/20 at 10:00am by video enabled telemedicine application and verified that I am speaking with the correct person using two identifiers.   I discussed the limitations, risks, security and privacy concerns of performing an evaluation and management service by telephone and the availability of in person appointments. I also discussed with the patient that there may be a patient responsible charge related to this service. The patient expressed understanding and agreed to proceed.   I discussed the assessment and treatment plan with the patient. The patient was provided an opportunity to ask questions and all were answered. The patient agreed with the plan and demonstrated an understanding of the instructions.   The patient was advised to call back or seek an in-person evaluation if the symptoms worsen or if the condition fails to improve as anticipated.   I provided 1 hour of non-face-to-face time during this encounter.     Courtney Stain, LCSW, LCAS ________________________ THERAPIST PROGRESS NOTE   Session Time: 10:00am - 11:00am  Location: Patient: Patient Home Provider: [facility]    Participation Level: Active   Behavioral Response: Alert, well groomed, casually dressed, euthymic affect/mood   Type of Therapy:  Individual Therapy   Treatment Goals addressed: Self-care routine; anxiety management; Medication management   Interventions: CBT, support system building and maintaining boundaries   Summary: Courtney [name] is a 29 year old female that presented for virtual session today and is diagnosed with Bipolar I Disorder, mixed, mild; Borderline personality disorder; PTSD; and ADHD, combined type.        Suicidal/Homicidal: None; without intent or plan   Therapist Response: Clinician spoke with Courtney [name] for virtual therapy appointment and assessed for safety, sobriety, and medication compliance.  Courtney [name]  presented for session on time and was alert, oriented x5, with no evidence or self-report of SI/HI or A/V H.  Courtney [name] reported that she continues taking medication as prescribed and admitted to ongoing use of Delta 8 once per day prior to meditation practice, but denies any adverse effects.  Clinician inquired about Courtney [name]'s current emotional ratings, as well as any significant changes in thoughts, feelings, or behavior since last check-in.  Courtney [name] reported scores of 0/10 for both depression and anxiety, and denied any presence of manic symptoms.  Courtney [name] also denied having any panic attacks since last session.  Clinician inquired about progress that Courtney [name] has made towards goals in past week, as well as present barriers to success. Courtney [name] reported that she has maintained boundaries with previous partner, has been meditating each day, and stated "I've been doing a lot of reflection on what's best for me, and I think that's why I've been really happy recently".  Courtney [name] reported that she went to a convention over the weekend with her friends and had a good time, stating "There was a lot to see, and it was full of my kind of people".  Courtney [name] reported that she would like to get out more in upcoming weeks and strengthen her support network with like minded, positive individuals.  Clinician was supportive of this goal, and informed Courtney [name] that research has shown that having people in one's life to talk to, spend time with, and get help from can make people mentally and physically healthier depending upon their overall influence and individual traits.  Clinician discussed positive and negative traits to look for in supports with Courtney [name] today, including red flags to be wary of which might signal need  for rigid boundaries.  Interventions were effective, as evidenced by Courtney [name] compiling a list of positive traits she will screen new supports for, including having similar spiritual beliefs, open  mindedness, direction in life, respect, compassion, and observing their actions closely to see if they match their word.  Courtney [name] reported that this session was helpful for increasing insight into what is best for her, and she will start watching how people treat others before bringing them into her 'inner circle.  Clinician will continue to monitor.                        Plan: Meet again in 1 week virtually.   Diagnosis: Bipolar I Disorder, mixed, mild; Borderline personality disorder; PTSD; ADHD, combined type.   Courtney Stain, LCSW, LCAS 03/09/20

## 2020-03-13 ENCOUNTER — Other Ambulatory Visit: Payer: Self-pay

## 2020-03-13 ENCOUNTER — Encounter: Payer: Self-pay | Admitting: Family Medicine

## 2020-03-13 ENCOUNTER — Other Ambulatory Visit (HOSPITAL_COMMUNITY)
Admission: RE | Admit: 2020-03-13 | Discharge: 2020-03-13 | Disposition: A | Discharge status: Home / Self Care | Payer: Medicaid Other | Source: Ambulatory Visit | Attending: Family Medicine | Admitting: Family Medicine

## 2020-03-13 ENCOUNTER — Ambulatory Visit: Payer: Medicaid Other | Admitting: Family Medicine

## 2020-03-13 VITALS — BP 110/60 | HR 99 | Temp 97.3°F | Resp 16 | Ht 59.0 in | Wt 210.1 lb

## 2020-03-13 DIAGNOSIS — Z13220 Encounter for screening for lipoid disorders: Secondary | ICD-10-CM

## 2020-03-13 DIAGNOSIS — R7303 Prediabetes: Secondary | ICD-10-CM

## 2020-03-13 DIAGNOSIS — R7989 Other specified abnormal findings of blood chemistry: Secondary | ICD-10-CM

## 2020-03-13 DIAGNOSIS — Z113 Encounter for screening for infections with a predominantly sexual mode of transmission: Secondary | ICD-10-CM | POA: Insufficient documentation

## 2020-03-13 DIAGNOSIS — Z0000 Encounter for general adult medical examination without abnormal findings: Secondary | ICD-10-CM | POA: Insufficient documentation

## 2020-03-13 DIAGNOSIS — M7121 Synovial cyst of popliteal space [Baker], right knee: Secondary | ICD-10-CM | POA: Insufficient documentation

## 2020-03-13 DIAGNOSIS — M545 Low back pain, unspecified: Secondary | ICD-10-CM

## 2020-03-13 DIAGNOSIS — E559 Vitamin D deficiency, unspecified: Secondary | ICD-10-CM | POA: Diagnosis not present

## 2020-03-13 DIAGNOSIS — I341 Nonrheumatic mitral (valve) prolapse: Secondary | ICD-10-CM | POA: Insufficient documentation

## 2020-03-13 DIAGNOSIS — K760 Fatty (change of) liver, not elsewhere classified: Secondary | ICD-10-CM
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
## 2020-03-13 NOTE — Patient Instructions (Signed)
Preventive Care 21-28 Years Old, Female Preventive care refers to visits with your health care provider and lifestyle choices that can promote health and wellness. This includes:  A yearly physical exam. This may also be called an annual well check.  Regular dental visits and eye exams.  Immunizations.  Screening for certain conditions.  Healthy lifestyle choices, such as eating a healthy diet, getting regular exercise, not using drugs or products that contain nicotine and tobacco, and limiting alcohol use. What can I expect for my preventive care visit? Physical exam Your health care provider will check your:  Height and weight. This may be used to calculate body mass index (BMI), which tells if you are at a healthy weight.  Heart rate and blood pressure.  Skin for abnormal spots. Counseling Your health care provider may ask you questions about your:  Alcohol, tobacco, and drug use.  Emotional well-being.  Home and relationship well-being.  Sexual activity.  Eating habits.  Work and work environment.  Method of birth control.  Menstrual cycle.  Pregnancy history. What immunizations do I need?  Influenza (flu) vaccine  This is recommended every year. Tetanus, diphtheria, and pertussis (Tdap) vaccine  You may need a Td booster every 10 years. Varicella (chickenpox) vaccine  You may need this if you have not been vaccinated. Human papillomavirus (HPV) vaccine  If recommended by your health care provider, you may need three doses over 6 months. Measles, mumps, and rubella (MMR) vaccine  You may need at least one dose of MMR. You may also need a second dose. Meningococcal conjugate (MenACWY) vaccine  One dose is recommended if you are age 19-21 years and a first-year college student living in a residence hall, or if you have one of several medical conditions. You may also need additional booster doses. Pneumococcal conjugate (PCV13) vaccine  You may need  this if you have certain conditions and were not previously vaccinated. Pneumococcal polysaccharide (PPSV23) vaccine  You may need one or two doses if you smoke cigarettes or if you have certain conditions. Hepatitis A vaccine  You may need this if you have certain conditions or if you travel or work in places where you may be exposed to hepatitis A. Hepatitis B vaccine  You may need this if you have certain conditions or if you travel or work in places where you may be exposed to hepatitis B. Haemophilus influenzae type b (Hib) vaccine  You may need this if you have certain conditions. You may receive vaccines as individual doses or as more than one vaccine together in one shot (combination vaccines). Talk with your health care provider about the risks and benefits of combination vaccines. What tests do I need?  Blood tests  Lipid and cholesterol levels. These may be checked every 5 years starting at age 20.  Hepatitis C test.  Hepatitis B test. Screening  Diabetes screening. This is done by checking your blood sugar (glucose) after you have not eaten for a while (fasting).  Sexually transmitted disease (STD) testing.  BRCA-related cancer screening. This may be done if you have a family history of breast, ovarian, tubal, or peritoneal cancers.  Pelvic exam and Pap test. This may be done every 3 years starting at age 21. Starting at age 30, this may be done every 5 years if you have a Pap test in combination with an HPV test. Talk with your health care provider about your test results, treatment options, and if necessary, the need for more tests.   Follow these instructions at home: Eating and drinking   Eat a diet that includes fresh fruits and vegetables, whole grains, lean protein, and low-fat dairy.  Take vitamin and mineral supplements as recommended by your health care provider.  Do not drink alcohol if: ? Your health care provider tells you not to drink. ? You are  pregnant, may be pregnant, or are planning to become pregnant.  If you drink alcohol: ? Limit how much you have to 0-1 drink a day. ? Be aware of how much alcohol is in your drink. In the [place] one drink equals one 12 oz bottle of beer (355 mL), one 5 oz glass of wine (148 mL), or one 1 oz glass of hard liquor (44 mL). Lifestyle  Take daily care of your teeth and gums.  Stay active. Exercise for at least 30 minutes on 5 or more days each week.  Do not use any products that contain nicotine or tobacco, such as cigarettes, e-cigarettes, and chewing tobacco. If you need help quitting, ask your health care provider.  If you are sexually active, practice safe sex. Use a condom or other form of birth control (contraception) in order to prevent pregnancy and STIs (sexually transmitted infections). If you plan to become pregnant, see your health care provider for a preconception visit. What's next?  Visit your health care provider once a year for a well check visit.  Ask your health care provider how often you should have your eyes and teeth checked.  Stay up to date on all vaccines. This information is not intended to replace advice given to you by your health care provider. Make sure you discuss any questions you have with your health care provider. Document Revised: 04/08/2018 Document Reviewed: 04/08/2018 Elsevier Patient Education  2020 [employer].

## 2020-03-14 ENCOUNTER — Telehealth (INDEPENDENT_AMBULATORY_CARE_PROVIDER_SITE_OTHER): Payer: Medicaid Other | Admitting: Psychiatry

## 2020-03-14 DIAGNOSIS — F603 Borderline personality disorder: Secondary | ICD-10-CM | POA: Diagnosis not present

## 2020-03-14 DIAGNOSIS — F902 Attention-deficit hyperactivity disorder, combined type: Secondary | ICD-10-CM

## 2020-03-14 DIAGNOSIS — F319 Bipolar disorder, unspecified: Secondary | ICD-10-CM

## 2020-03-14 DIAGNOSIS — F429 Obsessive-compulsive disorder, unspecified: Secondary | ICD-10-CM | POA: Diagnosis not present

## 2020-03-14 LAB — LIPID PANEL
Cholesterol: 108 mg/dL (ref <200)
HDL: 72 mg/dL (ref >=50)
LDL Cholesterol (Calc): 25 mg/dL (calc)
Non-HDL Cholesterol (Calc): 36 mg/dL (calc) (ref <130)
Total CHOL/HDL Ratio: 1.5 (calc) (ref <5.0)
Triglycerides: 38 mg/dL (ref <150)

## 2020-03-14 LAB — COMPLETE METABOLIC PANEL WITH GFR
AG Ratio: 1.4 (calc) (ref 1.0–2.5)
ALT: 14 U/L (ref 6–29)
AST: 15 U/L (ref 10–30)
Albumin: 4.2 g/dL (ref 3.6–5.1)
Alkaline phosphatase (APISO): 45 U/L (ref 31–125)
BUN: 14 mg/dL (ref 7–25)
CO2: 30 mmol/L (ref 20–32)
Calcium: 9.6 mg/dL (ref 8.6–10.2)
Chloride: 104 mmol/L (ref 98–110)
Creat: 0.71 mg/dL (ref 0.50–1.10)
GFR, Est African American: 135 mL/min/{1.73_m2} (ref >=60)
GFR, Est Non African American: 117 mL/min/{1.73_m2} (ref >=60)
Globulin: 3 g/dL (calc) (ref 1.9–3.7)
Glucose, Bld: 81 mg/dL (ref 65–99)
Potassium: 4.2 mmol/L (ref 3.5–5.3)
Sodium: 142 mmol/L (ref 135–146)
Total Bilirubin: 0.3 mg/dL (ref 0.2–1.2)
Total Protein: 7.2 g/dL (ref 6.1–8.1)

## 2020-03-14 LAB — HIV ANTIBODY (ROUTINE TESTING W REFLEX): HIV 1&2 Ab, 4th Generation: NONREACTIVE

## 2020-03-14 LAB — CBC WITH DIFFERENTIAL/PLATELET
Absolute Monocytes: 689 cells/uL (ref 200–950)
Basophils Absolute: 52 cells/uL (ref 0–200)
Basophils Relative: 0.8 %
Eosinophils Absolute: 78 cells/uL (ref 15–500)
Eosinophils Relative: 1.2 %
HCT: 38.8 % (ref 35.0–45.0)
Hemoglobin: 12.8 g/dL (ref 11.7–15.5)
Lymphs Abs: 2165 cells/uL (ref 850–3900)
MCH: 28.8 pg (ref 27.0–33.0)
MCHC: 33 g/dL (ref 32.0–36.0)
MCV: 87.4 fL (ref 80.0–100.0)
MPV: 10.5 fL (ref 7.5–12.5)
Monocytes Relative: 10.6 %
Neutro Abs: 3517 cells/uL (ref 1500–7800)
Neutrophils Relative %: 54.1 %
Platelets: 287 10*3/uL (ref 140–400)
RBC: 4.44 10*6/uL (ref 3.80–5.10)
RDW: 12.8 % (ref 11.0–15.0)
Total Lymphocyte: 33.3 %
WBC: 6.5 10*3/uL (ref 3.8–10.8)

## 2020-03-14 LAB — VITAMIN D 25 HYDROXY (VIT D DEFICIENCY, FRACTURES): Vit D, 25-Hydroxy: 30 ng/mL (ref 30–100)

## 2020-03-14 LAB — RPR: RPR Ser Ql: NONREACTIVE

## 2020-03-14 LAB — HEMOGLOBIN A1C
Hgb A1c MFr Bld: 5.6 % of total Hgb (ref <5.7)
Mean Plasma Glucose: 114 (calc)
eAG (mmol/L): 6.3 (calc)

## 2020-03-14 LAB — TSH: TSH: 0.27 mIU/L — ABNORMAL LOW

## 2020-03-14 MED ORDER — CARIPRAZINE HCL 1.5 MG PO CAPS: 1.5000 mg | ORAL_CAPSULE | Freq: Every day | ORAL | 0 refills | Status: DC

## 2020-03-14 MED ORDER — DULOXETINE HCL 30 MG PO CPEP: 90.0000 mg | ORAL_CAPSULE | Freq: Every day | ORAL | 0 refills | Status: DC

## 2020-03-14 MED ORDER — OXCARBAZEPINE 300 MG PO TABS: 300.0000 mg | ORAL_TABLET | Freq: Two times a day (BID) | ORAL | 0 refills | Status: DC
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
## 2020-03-15 ENCOUNTER — Other Ambulatory Visit: Payer: Self-pay

## 2020-03-15 ENCOUNTER — Ambulatory Visit (INDEPENDENT_AMBULATORY_CARE_PROVIDER_SITE_OTHER): Payer: Medicaid Other | Admitting: Licensed Clinical Social Worker

## 2020-03-15 DIAGNOSIS — F3161 Bipolar disorder, current episode mixed, mild: Secondary | ICD-10-CM

## 2020-03-15 DIAGNOSIS — F603 Borderline personality disorder: Secondary | ICD-10-CM

## 2020-03-15 DIAGNOSIS — F4310 Post-traumatic stress disorder, unspecified: Secondary | ICD-10-CM

## 2020-03-15 DIAGNOSIS — F902 Attention-deficit hyperactivity disorder, combined type: Secondary | ICD-10-CM

## 2020-03-15 LAB — CERVICOVAGINAL ANCILLARY ONLY
Chlamydia: NEGATIVE
Comment: NEGATIVE
Comment: NEGATIVE
Comment: NORMAL
Neisseria Gonorrhea: NEGATIVE
Trichomonas: NEGATIVE
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
## 2020-03-18 ENCOUNTER — Encounter: Payer: Self-pay | Admitting: Family Medicine

## 2020-03-20 ENCOUNTER — Other Ambulatory Visit: Payer: Self-pay | Admitting: Family Medicine

## 2020-03-20 DIAGNOSIS — R7989 Other specified abnormal findings of blood chemistry: Secondary | ICD-10-CM

## 2020-03-21 ENCOUNTER — Ambulatory Visit: Payer: Self-pay | Admitting: Obstetrics and Gynecology

## 2020-03-22 ENCOUNTER — Other Ambulatory Visit: Payer: Self-pay

## 2020-03-22 ENCOUNTER — Ambulatory Visit (INDEPENDENT_AMBULATORY_CARE_PROVIDER_SITE_OTHER): Payer: Medicaid Other | Admitting: Licensed Clinical Social Worker

## 2020-03-22 ENCOUNTER — Ambulatory Visit (INDEPENDENT_AMBULATORY_CARE_PROVIDER_SITE_OTHER): Payer: Medicaid Other | Admitting: Obstetrics and Gynecology

## 2020-03-22 ENCOUNTER — Encounter: Payer: Self-pay | Admitting: Obstetrics and Gynecology

## 2020-03-22 VITALS — BP 108/61 | HR 76 | Ht 59.0 in

## 2020-03-22 DIAGNOSIS — F3161 Bipolar disorder, current episode mixed, mild: Secondary | ICD-10-CM

## 2020-03-22 DIAGNOSIS — Z30432 Encounter for removal of intrauterine contraceptive device: Secondary | ICD-10-CM

## 2020-03-22 DIAGNOSIS — F4310 Post-traumatic stress disorder, unspecified: Secondary | ICD-10-CM | POA: Diagnosis not present

## 2020-03-22 DIAGNOSIS — F603 Borderline personality disorder: Secondary | ICD-10-CM | POA: Diagnosis not present

## 2020-03-22 DIAGNOSIS — F902 Attention-deficit hyperactivity disorder, combined type: Secondary | ICD-10-CM | POA: Diagnosis not present

## 2020-03-22 NOTE — Progress Notes (Signed)
Virtual Visit via Video Note   I connected with Courtney [name] on 03/22/20 at 11:00am by video enabled telemedicine application and verified that I am speaking with the correct person using two identifiers.   I discussed the limitations, risks, security and privacy concerns of performing an evaluation and management service by telephone and the availability of in person appointments. I also discussed with the patient that there may be a patient responsible charge related to this service. The patient expressed understanding and agreed to proceed.   I discussed the assessment and treatment plan with the patient. The patient was provided an opportunity to ask questions and all were answered. The patient agreed with the plan and demonstrated an understanding of the instructions.   The patient was advised to call back or seek an in-person evaluation if the symptoms worsen or if the condition fails to improve as anticipated.   I provided 45 minutes of non-face-to-face time during this encounter.     [name], LCSW, LCAS ________________________ THERAPIST PROGRESS NOTE   Session Time: 11:00am - 11:45am  Location: Patient: Patient Home Provider: [facility]    Participation Level: Active   Behavioral Response: Alert, well groomed, casually dressed, euthymic affect/mood   Type of Therapy:  Individual Therapy   Treatment Goals addressed: Self-care routine; anxiety management; Medication management; Communication skills; Increasing independence   Interventions: CBT, communication skills, conflict resolution, positive affirmations   Summary: Courtney [name] is a 29 year old female that presented for virtual session today and is diagnosed with Bipolar I Disorder, mixed, mild; Borderline personality disorder; PTSD; and ADHD, combined type.        Suicidal/Homicidal: None; without intent or plan   Therapist Response: Clinician spoke with Courtney [name] for virtual therapy appointment and  assessed for safety, sobriety, and medication compliance.  Courtney [name] presented for session on time and was alert, oriented x5, with no evidence or self-report of SI/HI or A/V H.  Paysen reported that she continues taking medication as prescribed and is using Delta 8 1-2x per day, but denies any adverse effects.  Clinician inquired about Lucillia's current emotional ratings, as well as any significant changes in thoughts, feelings, or behavior since last check-in.  Naomi reported scores of 0/10 for both depression and anxiety, and denied any presence of manic symptoms, which is consistent with last session.  Clinician inquired about notable succusses and struggles that Daysi has faced in previous week.  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  Sherlyn reported that this was something she was very proud of due to confidence displayed in interview process, and hopes she can sell her jewelry there as well to further increase financial security and independence.  Teddi reported that one struggle was moving out of her friend's house following an argument that broke out between them while out shopping.  Lakeidra reported that this also triggered a panic attack due to the stress that arose from the incident.  Clinician empathized with Dea regarding this challenge, and inquired about specifics leading to this argument, as well as how she attempted to cope.  Rickita reported that she has noticed more 'drama' between a particular couple she is living with and when they began arguing at the store, she warned them not to bring this 'drama' into the house.  Courtney [name] reported that they then  directed their anger towards her, and the situation escalated to the point where she decided to move out, and she used  5-4-3-2-1 grounding, along with a phone call to a supportive friend to intervene during panic episode.  Clinician praised Celester for effective use of grounding skills following the incident, but offered feedback on how to approach future conflict resolution, including choosing times when both parties involved are calm and collected, being mindful of passive, assertive, and aggressive language differences, as well as importance of taking time outs to cool down when necessary.  Janat was receptive to suggestions and noted that she usually puts in her headphones and ignores the situation, but has become decreasingly interested in socializing with that friend group, stating "After living with them, I got to know them better and its not good for my mental health".  Providence reported that since then, she has moved in temporarily with a different friend, and applied for section 8 housing as a backup in case stability there declines too.  Anisten reported that she has been able to retain a positive mood despite recent challenges, but finds herself worrying about the future at times.  Clinician discussed examples of future-oriented positive affirmations with Seanna today that could be utilized to improve outlook and reframe more effectively.  Interventions were effective, as evidenced by Courtney [name] reporting that she enjoyed several of these examples and would add them to morning routine, including "If I can conceive it and believe it, I can achieve it" and "The future is good, and I look toward it with hope and happiness".  Courtney [name] reported that she also found it helpful to reflect upon how she handled recent conflict to gauge whether changes could be made in approach, but needed to leave session early due to another appointment she had prior to noon.  She agreed to follow up again next week.  Clinician will continue to monitor.                        Plan: Meet again in 1 week virtually.   Diagnosis:  Bipolar I Disorder, mixed, mild; Borderline personality disorder; PTSD; ADHD, combined type.   [name], LCSW, LCAS 03/22/20

## 2020-03-22 NOTE — Progress Notes (Signed)
Patient ID: Courtney [name], female   DOB: January 25, 1991, 28 y.o.   MRN: [id number]  [facility] CLINIC PROCEDURE NOTE  Courtney [name] is a 29 y.o. [id number] here for Paragard IUD removal. No GYN concerns. The IUD was inserted on 11/15/2019. Last pap smear was normal on 02/17/2019.  IUD Removal  Patient was in the dorsal lithotomy position, normal external genitalia was noted.  A speculum was placed in the patient's vagina, normal discharge was noted, no lesions. The cervix was visualized, no lesions, no abnormal discharge; and the cervix was swabbed with Betadine using scopettes. The strings of the IUD were grasped and pulled using ring forceps. The IUD was successfully removed in its entirety. Patient tolerated the procedure well.  The patient is planning on using condoms for contraception or avoiding intercourse altogether. She may consider BTL in the future; she was unaware that she was able to have the surgery done without having children. The patient was provided a brochure about endometrial ablation.per her request.  By signing my name below, I, Pietro Cassis, attest that this documentation has been prepared under the direction and in the presence of Tilda Burrow, MD. Electronically Signed: Pietro Cassis, Medical Scribe. 03/22/20. 2:10 PM.  I personally performed the services described in this documentation, which was SCRIBED in my presence. The recorded information has been reviewed and considered accurate. It has been edited as necessary during review. Tilda Burrow, MD

## 2020-03-29 ENCOUNTER — Other Ambulatory Visit: Payer: Self-pay

## 2020-03-29 ENCOUNTER — Ambulatory Visit (INDEPENDENT_AMBULATORY_CARE_PROVIDER_SITE_OTHER): Payer: Medicaid Other | Admitting: Licensed Clinical Social Worker

## 2020-03-29 DIAGNOSIS — F603 Borderline personality disorder: Secondary | ICD-10-CM | POA: Diagnosis not present

## 2020-03-29 DIAGNOSIS — F3161 Bipolar disorder, current episode mixed, mild: Secondary | ICD-10-CM

## 2020-03-29 DIAGNOSIS — F4310 Post-traumatic stress disorder, unspecified: Secondary | ICD-10-CM | POA: Diagnosis not present

## 2020-03-29 DIAGNOSIS — F902 Attention-deficit hyperactivity disorder, combined type: Secondary | ICD-10-CM | POA: Diagnosis not present

## 2020-03-29 NOTE — Progress Notes (Signed)
Virtual Visit via Video Note   I connected with Courtney [name] on 03/29/20 at 11:00am by video enabled telemedicine application and verified that I am speaking with the correct person using two identifiers.   I discussed the limitations, risks, security and privacy concerns of performing an evaluation and management service by telephone and the availability of in person appointments. I also discussed with the patient that there may be a patient responsible charge related to this service. The patient expressed understanding and agreed to proceed.   I discussed the assessment and treatment plan with the patient. The patient was provided an opportunity to ask questions and all were answered. The patient agreed with the plan and demonstrated an understanding of the instructions.   The patient was advised to call back or seek an in-person evaluation if the symptoms worsen or if the condition fails to improve as anticipated.   I provided 45 minutes of non-face-to-face time during this encounter.   [name], LCSW, LCAS ________________________ THERAPIST PROGRESS NOTE   Session Time: 11:00am - 11:45am  Location: Patient: Patient Home Provider: [facility]    Participation Level: Active   Behavioral Response: Alert, well groomed, casually dressed, euthymic affect/mood   Type of Therapy:  Individual Therapy   Treatment Goals addressed: Self-care routine; anxiety management; Medication management   Interventions: CBT   Summary: Courtney [name] is a 29 year old female that presented for virtual session today and is diagnosed with Bipolar I Disorder, mixed, mild; Borderline personality disorder; PTSD; and ADHD, combined type.        Suicidal/Homicidal: None; without intent or plan   Therapist Response: Clinician spoke with Courtney [name] for therapy session and assessed for safety, sobriety, and medication compliance.  Courtney [name] presented for appointment on time and was alert, oriented x5,  with no evidence or self-report of SI/HI or A/V H.  Courtney [name] reported compliance with medication and limited use of Delta8.  Clinician inquired about Courtney [name]'s emotional ratings today, as well as any significant changes in thoughts, feelings, or behavior since previous check-in.  Courtney [name] reported scores of 0/10 for both depression and anxiety, which is consistent with previous session.  She denied any manic symptoms and stated "I've just been overwhelmed with positivity lately".  Clinician inquired about notable succusses and struggles that Courtney [name] has faced since last check-in.  Courtney [name] reported that she permanently moved in with different friends following fallout from previous friend group, and noted that this has offered less stress and more support overall, which has benefited mood.  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  Courtney [name] reported that although she has met some potential "Relationship prospects", she is not rushing into anything at this time, and stated "I know I need to focus on bettering myself before I jump into another serious relationship yet".  Courtney [name] denied any struggles at this time, and reported that she did not have any issues that needed to be addressed, so clinician suggested creating self-care action plan for following week to maintain positive mood and wellbeing.  Intervention was effective, as evidenced by Courtney [name] reporting that she would focus upon continuing to network with positive supports that share her same values and have healthy personalities, recite positive affirmations each day upon waking, meditate daily, engage in self-care hobbies such as drawing and poetry through the week, as well  as make an effort to curb impulsivity by weighing pros and cons of decisions beforehand.  Courtney [name] stated  "Its always helpful to talk these things out and structure my steps for the days ahead".  Clinician will continue to monitor.                        Plan: Meet again in 1 week virtually.   Diagnosis: Bipolar I Disorder, mixed, mild; Borderline personality disorder; PTSD; ADHD, combined type.   [name], LCSW, LCAS 03/29/20

## 2020-04-04 ENCOUNTER — Other Ambulatory Visit: Payer: Self-pay

## 2020-04-04 ENCOUNTER — Telehealth (INDEPENDENT_AMBULATORY_CARE_PROVIDER_SITE_OTHER): Payer: Medicaid Other | Admitting: Psychiatry

## 2020-04-04 DIAGNOSIS — F319 Bipolar disorder, unspecified: Secondary | ICD-10-CM

## 2020-04-04 DIAGNOSIS — F603 Borderline personality disorder: Secondary | ICD-10-CM | POA: Diagnosis not present

## 2020-04-04 DIAGNOSIS — F902 Attention-deficit hyperactivity disorder, combined type: Secondary | ICD-10-CM | POA: Diagnosis not present

## 2020-04-04 MED ORDER — CARIPRAZINE HCL 3 MG PO CAPS: 3.0000 mg | ORAL_CAPSULE | Freq: Every day | ORAL | 0 refills | Status: AC

## 2020-04-04 MED ORDER — DULOXETINE HCL 30 MG PO CPEP: 90.0000 mg | ORAL_CAPSULE | Freq: Every day | ORAL | 0 refills | Status: DC
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
## 2020-04-05 ENCOUNTER — Ambulatory Visit (INDEPENDENT_AMBULATORY_CARE_PROVIDER_SITE_OTHER): Payer: Medicaid Other | Admitting: Licensed Clinical Social Worker

## 2020-04-05 ENCOUNTER — Other Ambulatory Visit: Payer: Self-pay

## 2020-04-05 DIAGNOSIS — F3161 Bipolar disorder, current episode mixed, mild: Secondary | ICD-10-CM

## 2020-04-05 DIAGNOSIS — F902 Attention-deficit hyperactivity disorder, combined type: Secondary | ICD-10-CM

## 2020-04-05 DIAGNOSIS — F603 Borderline personality disorder: Secondary | ICD-10-CM

## 2020-04-05 DIAGNOSIS — F4310 Post-traumatic stress disorder, unspecified: Secondary | ICD-10-CM | POA: Diagnosis not present

## 2020-04-05 NOTE — Progress Notes (Signed)
Virtual Visit via Video Note   I connected with Courtney [name] on 04/05/20 at 11:00am by video enabled telemedicine application and verified that I am speaking with the correct person using two identifiers.   I discussed the limitations, risks, security and privacy concerns of performing an evaluation and management service by telephone and the availability of in person appointments. I also discussed with the patient that there may be a patient responsible charge related to this service. The patient expressed understanding and agreed to proceed.   I discussed the assessment and treatment plan with the patient. The patient was provided an opportunity to ask questions and all were answered. The patient agreed with the plan and demonstrated an understanding of the instructions.   The patient was advised to call back or seek an in-person evaluation if the symptoms worsen or if the condition fails to improve as anticipated.   I provided 1 hour of non-face-to-face time during this encounter.   Courtney Stain, LCSW, LCAS ________________________ THERAPIST PROGRESS NOTE   Session Time: 11:00am - 12:00pm  Location: Patient: Patient Home Provider: [facility] Office    Participation Level: Active   Behavioral Response: Alert, well groomed, casually dressed, anxious affect/mood   Type of Therapy:  Individual Therapy   Treatment Goals addressed: Self-care routine; anxiety management; Medication management   Interventions: CBT, problem solving   Summary: Courtney [name] is a 29 year old female that presented for virtual session today and is diagnosed with Bipolar I Disorder, mixed, mild; Borderline personality disorder; PTSD; and ADHD, combined type.        Suicidal/Homicidal: None; without intent or plan   Therapist Response: Clinician spoke with Courtney [name] for virtual appointment and assessed for safety, sobriety, and medication compliance.  Courtney [name] presented for session on time and was alert,  oriented x5, with no evidence or self-report of SI/HI or A/V H.  Courtney [name] reported that she is compliant with medication at this time.  She reported that she has been smoking marijuana x5 per day on average to assist with anxiety management.  Clinician inquired about Courtney [name]'s current emotional ratings, as well as any significant changes in thoughts, feelings, or behavior since last check-in.  Courtney [name] reported scores of 10/10 for both depression and anxiety, and shared that she has had roughly 5 panic attacks since last week due to increasingly unstable living arrangements.  Courtney [name] reported that she is about to run out of options for places to stay and expects to become homeless by Wednesday of next week, so this has caused her to experience racing thoughts about various 'what if' scenarios that could play out if she doesn't have a stable place to stay each night. Courtney [name] became noticeably more anxious processing this during session and reported that she wanted to smoke marijuana to calm down, but clinician invited her to practice previously taught grounding and relaxation skills instead to reduce reliance upon illicit substances.  Courtney [name] practiced deep breathing following guidance from clinician, and was able to calm down and avoid panic attack.  Clinician took problem solving approach with Courtney [name] today to identify available options she could explore to avoid homelessness, including outreaching support network, and offering local resources such as shelters, and nearby motels should beds be unavailable elsewhere.  Courtney [name] reported that she has already outreached supports with little benefit, and was apprehensive about returning to a shelter environment due to negative experiences in past.  Clinician assisted Courtney [name] in weighing pros and cons of entering shelter if necessary versus staying outside, highlighting  the impact transition could have on her mental and physical wellbeing and safety.   Courtney [name] expressed more openness to exploring these options following discussion, so clinician provided names, numbers, and locations for various related agencies/shelters around area, including [employer], [employer], and [employer].  Clinician also provided listings for low-cost motels in the area she could temporarily stay in depending upon available funds.  Intervention was effective, as evidenced by Courtney [name] reporting that this session was helpful for intervening during a panic attack and showing her that she could use available coping skills instead of substances to calm down.  She also reported that this was helpful for keeping her from catastrophizing, and focusing more on solutions rather than problems when faced with crisis.  Courtney [name] reported that she would outreach psychiatrist today about restarting on antianxiety medication following increased period of stress.  Clinician will continue to monitor.                      Plan: Meet again in 1 week virtually.   Diagnosis: Bipolar I Disorder, mixed, mild; Borderline personality disorder; PTSD; ADHD, combined type.   Courtney Stain, LCSW, LCAS 04/05/20

## 2020-04-12 ENCOUNTER — Ambulatory Visit (INDEPENDENT_AMBULATORY_CARE_PROVIDER_SITE_OTHER): Payer: Medicaid Other | Admitting: Licensed Clinical Social Worker

## 2020-04-12 ENCOUNTER — Other Ambulatory Visit: Payer: Self-pay

## 2020-04-12 ENCOUNTER — Ambulatory Visit (HOSPITAL_COMMUNITY): Payer: Medicaid Other | Admitting: Licensed Clinical Social Worker

## 2020-04-12 DIAGNOSIS — F603 Borderline personality disorder: Secondary | ICD-10-CM

## 2020-04-12 DIAGNOSIS — F4310 Post-traumatic stress disorder, unspecified: Secondary | ICD-10-CM | POA: Diagnosis not present

## 2020-04-12 DIAGNOSIS — F902 Attention-deficit hyperactivity disorder, combined type: Secondary | ICD-10-CM

## 2020-04-12 DIAGNOSIS — F3161 Bipolar disorder, current episode mixed, mild: Secondary | ICD-10-CM

## 2020-04-12 NOTE — Progress Notes (Signed)
Virtual Visit via Video Note   I connected with Courtney [name] on 04/12/20 at 11:00am by video enabled telemedicine application and verified that I am speaking with the correct person using two identifiers.   I discussed the limitations, risks, security and privacy concerns of performing an evaluation and management service by telephone and the availability of in person appointments. I also discussed with the patient that there may be a patient responsible charge related to this service. The patient expressed understanding and agreed to proceed.   I discussed the assessment and treatment plan with the patient. The patient was provided an opportunity to ask questions and all were answered. The patient agreed with the plan and demonstrated an understanding of the instructions.   The patient was advised to call back or seek an in-person evaluation if the symptoms worsen or if the condition fails to improve as anticipated.   I provided 1 hour of non-face-to-face time during this encounter.   [name], LCSW, LCAS ________________________ THERAPIST PROGRESS NOTE   Session Time: 11:00am - 12:00pm   Location: Patient: Patient Home Provider: [facility]    Participation Level: Active   Behavioral Response: Alert, well groomed, casually dressed, euthymic affect/mood   Type of Therapy:  Individual Therapy   Treatment Goals addressed: Self-care routine; anxiety management; Medication management; Healthy boundaries   Interventions: CBT, healthy boundaries and strengths based   Summary: Courtney [name] is a 29 year old female that presented for virtual session today and is diagnosed with Bipolar I Disorder, mixed, mild; Borderline personality disorder; PTSD; and ADHD, combined type.        Suicidal/Homicidal: None; without intent or plan   Therapist Response: Clinician spoke with Courtney [name] for virtual session and assessed for safety, sobriety, and medication compliance.  Courtney [name]  presented for appointment on time and was alert, oriented x5, with no evidence or self-report of SI/HI or A/V H.  Courtney [name] reported ongoing compliance with medication.  She denied any use of alcohol or illicit substances but appeared slightly manic and stated "When I'm on my period I'm always a little manic, but I'm not being impulsive".  Clinician inquired about Courtney [name]'s emotional ratings today, as well as any significant changes in thoughts, feelings, or behavior since previous check-in.  Courtney [name] reported scores of 1/10 for depression and 2/10 for anxiety, and noted over 5 panic attacks since previous session, stating "It was pretty crazy for a minute there".  Clinician inquired about how Courtney [name] handled recent housing crisis and whether she is presently staying somewhere safe and stable.  Courtney [name] reported that she ended up staying alone in a motel room for 5 days and had several breakdowns, but was able to transition into a place temporarily with a friend she trusts, and is expected to rent out a building from a different friend in a few days where she can stay as long as needed.  Clinician processed this difficult transition with Courtney [name], including what coping skills she utilized to handle this crisis and avoid homelessness and negative impact on mental health, given her lowered scores today.  Courtney [name] reported that she outreached friends and family, prayed, meditated, and tried to find helpful distractions to keep spirits high, including going to [employer] spontaneously one day when a friend donated some money, which she greatly enjoyed.  Courtney [name] reported that one significant benefit of the experience was making her assess her support system, and need to enforce stronger boundaries with toxic family members, as they would not offer to help,  and this made her feel worthless and worsened state of depression.  Clinician praised Courtney [name] for her resilience when faced with adversity, and  encouraged her to continue assessing for quality of individual supports within network and manage boundaries appropriately, given impact that problematic individuals have had on her overall wellbeing in the past.  Clinician inquired about Courtney [name]'s plan for the days ahead to promote positive mood and stability.  Interventions were effective, as evidenced by Courtney [name] reporting that this session gave her positive reinforcement for changes being made in daily routine, and noted that she would be focusing on keeping boundaries with family, nurturing new positive friendships, exploring positive self-care activities like making jewelry, and practicing positive affirmations such as "I'm making it through this and everything will be alright".  Clinician will continue to monitor.                       Plan: Meet again in 1 week virtually.   Diagnosis: Bipolar I Disorder, mixed, mild; Borderline personality disorder; PTSD; ADHD, combined type.   [name], LCSW, LCAS 04/12/20

## 2020-04-19 ENCOUNTER — Ambulatory Visit (HOSPITAL_COMMUNITY): Payer: Medicaid Other | Admitting: Licensed Clinical Social Worker

## 2020-04-26 ENCOUNTER — Ambulatory Visit (INDEPENDENT_AMBULATORY_CARE_PROVIDER_SITE_OTHER): Payer: Medicaid Other | Admitting: Licensed Clinical Social Worker

## 2020-04-26 ENCOUNTER — Ambulatory Visit (HOSPITAL_COMMUNITY): Payer: Medicaid Other | Admitting: Licensed Clinical Social Worker

## 2020-04-26 ENCOUNTER — Other Ambulatory Visit: Payer: Self-pay

## 2020-04-26 DIAGNOSIS — F603 Borderline personality disorder: Secondary | ICD-10-CM | POA: Diagnosis not present

## 2020-04-26 DIAGNOSIS — F4310 Post-traumatic stress disorder, unspecified: Secondary | ICD-10-CM

## 2020-04-26 DIAGNOSIS — F902 Attention-deficit hyperactivity disorder, combined type: Secondary | ICD-10-CM

## 2020-04-26 DIAGNOSIS — F3161 Bipolar disorder, current episode mixed, mild: Secondary | ICD-10-CM

## 2020-04-26 NOTE — Progress Notes (Signed)
Virtual Visit via Video Note   I connected with Courtney [name] on 04/26/20 at 11:00am by video enabled telemedicine application and verified that I am speaking with the correct person using two identifiers.   I discussed the limitations, risks, security and privacy concerns of performing an evaluation and management service by telephone and the availability of in person appointments. I also discussed with the patient that there may be a patient responsible charge related to this service. The patient expressed understanding and agreed to proceed.   I discussed the assessment and treatment plan with the patient. The patient was provided an opportunity to ask questions and all were answered. The patient agreed with the plan and demonstrated an understanding of the instructions.   The patient was advised to call back or seek an in-person evaluation if the symptoms worsen or if the condition fails to improve as anticipated.   I provided 45 minutes of non-face-to-face time during this encounter.   [name], LCSW, LCAS ________________________ THERAPIST PROGRESS NOTE   Session Time: 11:00am - 11:45am   Location: Patient: Patient Home Provider: [facility]    Participation Level: Active   Behavioral Response: Alert, well groomed, casually dressed, euthymic affect/mood   Type of Therapy:  Individual Therapy   Treatment Goals addressed: Anxiety management; Medication management; Increasing independence; Budgeting    Interventions: CBT   Summary: Courtney [name] is a 29 year old female that presented for virtual session today and is diagnosed with Bipolar I Disorder, mixed, mild; Borderline personality disorder; PTSD; and ADHD, combined type.        Suicidal/Homicidal: None; without intent or plan   Therapist Response: Clinician spoke with [name] for virtual appointment and assessed for safety, sobriety, and medication compliance.  Courtney [name] presented for session on time and was  alert, oriented x5, with no evidence or self-report of SI/HI or A/V H.  Courtney [name] reported that she continues taking medication as prescribed and denied any use of alcohol or illicit substances.  Clinician inquired about Courtney [name]'s current emotional ratings, as well as any significant changes in thoughts, feelings, or behavior since last check-in.  Courtney [name] reported scores of 1/10 for both depression and anxiety, and noted that overall panic attacks continue decreasing, stating "I did have one 2 days ago when I was in public and couldn't find my friend, but I was able to calm myself down".  Courtney [name] denied any mania or manic episodes recently, but acknowledged that she has been more unfocused due to ADHD.  Clinician discussed strategies with Courtney [name] for becoming more organized and structured with daily tasks, including making to do lists and breaking goals down into more manageable tasks to be prioritized.  Courtney [name] was receptive to this approach and noted that she recently moved in with a new friend and they have agreed to split up chores in the household like laundry and cleaning, so this will help her stay productive, increase independence, and accountability.  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  Courtney [name] denied having any other pressing concerns to address today in session and noted that this discussion was helpful for directing attention towards goals she can work on in following days, and stated "I need to avoid distractions and make lists to get things done".  Clinician will continue to monitor.  Plan: Meet again in 1 week virtually.   Diagnosis: Bipolar I Disorder, mixed, mild; Borderline personality disorder; PTSD; ADHD, combined type.   [name], LCSW, LCAS 04/26/20

## 2020-05-03 ENCOUNTER — Ambulatory Visit (INDEPENDENT_AMBULATORY_CARE_PROVIDER_SITE_OTHER): Payer: Medicaid Other | Admitting: Licensed Clinical Social Worker

## 2020-05-03 ENCOUNTER — Ambulatory Visit (HOSPITAL_COMMUNITY): Payer: Medicaid Other | Admitting: Licensed Clinical Social Worker

## 2020-05-03 ENCOUNTER — Other Ambulatory Visit: Payer: Self-pay

## 2020-05-03 DIAGNOSIS — F4310 Post-traumatic stress disorder, unspecified: Secondary | ICD-10-CM

## 2020-05-03 DIAGNOSIS — F3161 Bipolar disorder, current episode mixed, mild: Secondary | ICD-10-CM

## 2020-05-03 DIAGNOSIS — F603 Borderline personality disorder: Secondary | ICD-10-CM

## 2020-05-03 DIAGNOSIS — F902 Attention-deficit hyperactivity disorder, combined type: Secondary | ICD-10-CM | POA: Diagnosis not present

## 2020-05-03 NOTE — Progress Notes (Signed)
Virtual Visit via Video Note   I connected with Courtney [name] on 05/03/20 at 11:00am by video enabled telemedicine application and verified that I am speaking with the correct person using two identifiers.   I discussed the limitations, risks, security and privacy concerns of performing an evaluation and management service by telephone and the availability of in person appointments. I also discussed with the patient that there may be a patient responsible charge related to this service. The patient expressed understanding and agreed to proceed.   I discussed the assessment and treatment plan with the patient. The patient was provided an opportunity to ask questions and all were answered. The patient agreed with the plan and demonstrated an understanding of the instructions.   The patient was advised to call back or seek an in-person evaluation if the symptoms worsen or if the condition fails to improve as anticipated.   I provided 1 hour of non-face-to-face time during this encounter.   [name], LCSW, LCAS ________________________ THERAPIST PROGRESS NOTE   Session Time: 11:00am - 12:00pm   Location: Patient: Patient Home Provider: [facility]    Participation Level: Active   Behavioral Response: Alert, well groomed, casually dressed, anxious affect/mood   Type of Therapy:  Individual Therapy   Treatment Goals addressed: Depression and Anxiety management; Medication management; Healthy boundaries   Interventions: CBT, healthy boundaries, impulse control    Summary: Courtney [name] is a 29 year old female that presented for virtual session today and is diagnosed with Bipolar I Disorder, mixed, mild; Borderline personality disorder; PTSD; and ADHD, combined type.        Suicidal/Homicidal: None; without intent or plan   Therapist Response: Clinician spoke with Courtney [name] for virtual session and assessed for safety, sobriety, and medication compliance.  Courtney [name] presented for  appointment on time and was alert, oriented x5, with no evidence or self-report of SI/HI or A/V H.  Courtney [name] reported ongoing compliance with medication and denied any use of alcohol or illicit substances.  Clinician inquired about Courtney [name]'s emotional ratings today, as well as any significant changes in thoughts, feelings, or behavior since previous check-in.  Courtney [name] reported scores of 1/10 for depression and 3/10 for anxiety, and denied any reoccurrence of panic attacks.  Clinician inquired about Courtney [name]'s recent struggles and successes in past week.  Courtney [name] reported that she met someone new online and in the course of 5 days, she almost went down to visit him in [place] despite red flags that quickly became apparent.  Clinician discussed impulse control strategies today with Courtney [name] to help prevent future risky decisions similar to this, including taking time to question herself about underlying rationale for impulse, working to increase delay time rather than simply acting upon initial urges, and seeking feedback from supportive, positive peers beforehand.  Courtney [name] acknowledged that talking to her friend made the biggest difference, and she has been too lenient with boundaries lately online.  Clinician pointed out the risks that porous boundaries pose to Courtney [name] as a result of her history of borderline traits and trauma, and encouraged her to be more cautious of who she allows in her inner circle, as well as the impact this can have on her mental health.  Interventions were effective, as evidenced by Courtney [name] reporting that this session helped her gain additional insight into recent challenges, reinforced her decision to focus upon self-care such as journaling and meditation, and highlighted importance of continuing to take medication to manage mania.  Clinician will continue to monitor.  Plan: Meet again in 1 week virtually.   Diagnosis: Bipolar I Disorder, mixed,  mild; Borderline personality disorder; PTSD; ADHD, combined type.   [name], LCSW, LCAS 05/03/20

## 2020-05-10 ENCOUNTER — Other Ambulatory Visit: Payer: Self-pay

## 2020-05-10 ENCOUNTER — Ambulatory Visit (HOSPITAL_COMMUNITY): Payer: Medicaid Other | Admitting: Licensed Clinical Social Worker

## 2020-05-10 ENCOUNTER — Ambulatory Visit (INDEPENDENT_AMBULATORY_CARE_PROVIDER_SITE_OTHER): Payer: Medicaid Other | Admitting: Licensed Clinical Social Worker

## 2020-05-10 DIAGNOSIS — F4310 Post-traumatic stress disorder, unspecified: Secondary | ICD-10-CM | POA: Diagnosis not present

## 2020-05-10 DIAGNOSIS — F3161 Bipolar disorder, current episode mixed, mild: Secondary | ICD-10-CM

## 2020-05-10 DIAGNOSIS — F902 Attention-deficit hyperactivity disorder, combined type: Secondary | ICD-10-CM | POA: Diagnosis not present

## 2020-05-10 DIAGNOSIS — F603 Borderline personality disorder: Secondary | ICD-10-CM

## 2020-05-10 NOTE — Progress Notes (Signed)
Virtual Visit via Video Note   I connected with Courtney [name] on 05/10/20 at 11:00am by video enabled telemedicine application and verified that I am speaking with the correct person using two identifiers.   I discussed the limitations, risks, security and privacy concerns of performing an evaluation and management service by telephone and the availability of in person appointments. I also discussed with the patient that there may be a patient responsible charge related to this service. The patient expressed understanding and agreed to proceed.   I discussed the assessment and treatment plan with the patient. The patient was provided an opportunity to ask questions and all were answered. The patient agreed with the plan and demonstrated an understanding of the instructions.   The patient was advised to call back or seek an in-person evaluation if the symptoms worsen or if the condition fails to improve as anticipated.   I provided 30 minutes of non-face-to-face time during this encounter.   [name], LCSW, LCAS ________________________ THERAPIST PROGRESS NOTE   Session Time: 11:00am - 11:30am  Location: Patient: Patient Home Provider: [facility]    Participation Level: Active   Behavioral Response: Alert, neatly groomed, casually dressed, euthymic affect/mood   Type of Therapy:  Individual Therapy   Treatment Goals addressed: Depression and Anxiety management; Medication management; Improving independence    Interventions: CBT   Summary: Courtney [name] is a 29 year old female that presented for virtual session today and is diagnosed with Bipolar I Disorder, mixed, mild; Borderline personality disorder; PTSD; and ADHD, combined type.        Suicidal/Homicidal: None; without intent or plan   Therapist Response: Clinician spoke with Courtney [name] for virtual appointment and assessed for safety, sobriety, and medication compliance.  Courtney [name] presented for this session on time  and was alert, oriented x5, with no evidence or self-report of SI/HI or A/V H.  Courtney [name] reported that she continues taking medication as prescribed and is limiting THC use to 2-3x per week to avoid dependence.  Clinician inquired about Courtney [name]'s current emotional ratings, as well as any significant changes in thoughts, feelings, or behavior since last check-in.  Courtney [name] reported scores of 0/10 for depression and 1/10 for anxiety, and denied experiencing any panic attacks since last session.  Clinician inquired about Tatym's successes and challenges in past week.  Courtney [name] reported that she broke her arm when playing with her friend's dog a few days ago, so she has been trying to take things easy so that it can heal properly.  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  Courtney [name] reported that her friend brought it to her attention that she has appeared more distractible and unable to focus on things lately, so she is considering getting back on ADHD medication if psychiatrist approves when they meet again on 05/17/20.  Clinician was supportive of this, and recommended that she also begin keeping a daily or weekly planner to improve structure and predictability in her daily life, and assisted her in mapping out her plans for the following day as an example.  Interventions were effective, as evidenced by Courtney [name] engaging in daily planning process and finding healthy balance of activities for both pleasure and productivity to fill her time with, such as visiting the gym in the morning, meditating and showering after this, and then doing some cleaning in the afternoon before later celebrating with her friend.  Courtney [name] reported that she felt confident this  approach will help her become more organized and increase independence as well.  Courtney [name] ended session early due to her friend having a medical emergency that needed to be tended to,  and agreed to follow up in 1 week.  Clinician will continue to monitor.                       Plan: Meet again in 1 week virtually.   Diagnosis: Bipolar I Disorder, mixed, mild; Borderline personality disorder; PTSD; ADHD, combined type.   [name], LCSW, LCAS 05/10/20

## 2020-05-14 ENCOUNTER — Other Ambulatory Visit: Payer: Self-pay

## 2020-05-14 ENCOUNTER — Telehealth (HOSPITAL_COMMUNITY): Payer: Medicaid Other | Admitting: Psychiatry

## 2020-05-14 ENCOUNTER — Telehealth (INDEPENDENT_AMBULATORY_CARE_PROVIDER_SITE_OTHER): Payer: Medicaid Other | Admitting: Psychiatry

## 2020-05-14 DIAGNOSIS — F603 Borderline personality disorder: Secondary | ICD-10-CM

## 2020-05-14 DIAGNOSIS — F319 Bipolar disorder, unspecified: Secondary | ICD-10-CM

## 2020-05-14 DIAGNOSIS — F429 Obsessive-compulsive disorder, unspecified: Secondary | ICD-10-CM | POA: Diagnosis not present

## 2020-05-14 DIAGNOSIS — F902 Attention-deficit hyperactivity disorder, combined type: Secondary | ICD-10-CM | POA: Diagnosis not present

## 2020-05-14 MED ORDER — CLONAZEPAM 1 MG PO TABS: 1.0000 mg | ORAL_TABLET | Freq: Two times a day (BID) | ORAL | 1 refills | Status: DC | PRN

## 2020-05-14 MED ORDER — AMPHETAMINE-DEXTROAMPHET ER 15 MG PO CP24
15.0000 mg | ORAL_CAPSULE | ORAL | 0 refills | Status: DC
Start: 2020-05-14 — End: 2020-06-18

## 2020-05-14 MED ORDER — OXCARBAZEPINE 300 MG PO TABS: 300.0000 mg | ORAL_TABLET | Freq: Two times a day (BID) | ORAL | 0 refills | Status: DC

## 2020-05-14 MED ORDER — AMPHETAMINE-DEXTROAMPHET ER 15 MG PO CP24
15.0000 mg | ORAL_CAPSULE | ORAL | 0 refills | Status: DC
Start: 2020-06-13 — End: 2020-08-15

## 2020-05-14 MED ORDER — CARIPRAZINE HCL 3 MG PO CAPS: 3.0000 mg | ORAL_CAPSULE | Freq: Every day | ORAL | 2 refills | Status: AC

## 2020-05-14 MED ORDER — AMPHETAMINE-DEXTROAMPHET ER 15 MG PO CP24
15.0000 mg | ORAL_CAPSULE | ORAL | 0 refills | Status: DC
Start: 2020-07-13 — End: 2020-08-15
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
## 2020-05-15 ENCOUNTER — Telehealth (HOSPITAL_COMMUNITY): Payer: Self-pay | Admitting: *Deleted

## 2020-05-15 NOTE — Telephone Encounter (Signed)
Patient has received prior authorization for Vraylar 3mg .  Prior Berkley Harvey.  It is authorized until 05/10/2021.

## 2020-05-17 ENCOUNTER — Ambulatory Visit (INDEPENDENT_AMBULATORY_CARE_PROVIDER_SITE_OTHER): Payer: Medicaid Other | Admitting: Licensed Clinical Social Worker

## 2020-05-17 ENCOUNTER — Other Ambulatory Visit: Payer: Self-pay

## 2020-05-17 DIAGNOSIS — F902 Attention-deficit hyperactivity disorder, combined type: Secondary | ICD-10-CM | POA: Diagnosis not present

## 2020-05-17 DIAGNOSIS — F4310 Post-traumatic stress disorder, unspecified: Secondary | ICD-10-CM | POA: Diagnosis not present

## 2020-05-17 DIAGNOSIS — F3161 Bipolar disorder, current episode mixed, mild: Secondary | ICD-10-CM | POA: Diagnosis not present

## 2020-05-17 DIAGNOSIS — F603 Borderline personality disorder: Secondary | ICD-10-CM
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
## 2020-05-24 ENCOUNTER — Ambulatory Visit (INDEPENDENT_AMBULATORY_CARE_PROVIDER_SITE_OTHER): Payer: Medicaid Other | Admitting: Licensed Clinical Social Worker

## 2020-05-24 ENCOUNTER — Other Ambulatory Visit: Payer: Self-pay

## 2020-05-24 DIAGNOSIS — F3161 Bipolar disorder, current episode mixed, mild: Secondary | ICD-10-CM | POA: Diagnosis not present

## 2020-05-24 DIAGNOSIS — F902 Attention-deficit hyperactivity disorder, combined type: Secondary | ICD-10-CM | POA: Diagnosis not present

## 2020-05-24 DIAGNOSIS — F4310 Post-traumatic stress disorder, unspecified: Secondary | ICD-10-CM | POA: Diagnosis not present

## 2020-05-24 DIAGNOSIS — F603 Borderline personality disorder: Secondary | ICD-10-CM

## 2020-05-24 NOTE — Progress Notes (Signed)
Virtual Visit via Video Note   I connected with Courtney [name] on 05/24/20 at 11:00am by video enabled telemedicine application and verified that I am speaking with the correct person using two identifiers.   I discussed the limitations, risks, security and privacy concerns of performing an evaluation and management service by video and the availability of in person appointments. I also discussed with the patient that there may be a patient responsible charge related to this service. The patient expressed understanding and agreed to proceed.   I discussed the assessment and treatment plan with the patient. The patient was provided an opportunity to ask questions and all were answered. The patient agreed with the plan and demonstrated an understanding of the instructions.   The patient was advised to call back or seek an in-person evaluation if the symptoms worsen or if the condition fails to improve as anticipated.   I provided 30 minutes of non-face-to-face time during this encounter.   [name], LCSW, LCAS ________________________ THERAPIST PROGRESS NOTE   Session Time: 11:00am - 11:30am  Location: Patient: Patient Home Provider: [facility] [place] [facility]    Participation Level: Active   Behavioral Response: Alert, neatly groomed, casually dressed, euthymic affect/mood   Type of Therapy:  Individual Therapy   Treatment Goals addressed: Depression and Anxiety management; Medication management; Improving independence; Exercise   Interventions: CBT, increasing awareness of borderline traits    Summary: Courtney [name] is a 29 year old female that presented for virtual session today and is diagnosed with Bipolar I Disorder, mixed, mild; Borderline personality disorder; PTSD; and ADHD, combined type.        Suicidal/Homicidal: None; without intent or plan   Therapist Response: Clinician spoke with Courtney [name] for virtual appointment and assessed for safety, sobriety, and medication  compliance.  Courtney [name] presented for this session on time and was alert, oriented x5, with no evidence or self-report of SI/HI or A/V H.  Courtney [name] reported that she continues taking medication as prescribed, and has lowered marijuana use to x1 per day on average.  Clinician inquired about Courtney [name]'s current emotional ratings, as well as any significant changes in thoughts, feelings, or behavior since last check-in.  Courtney [name] reported scores of 0/10 for both depression and anxiety, and denied any reoccurrence of panic attacks, which is consistent with previous session.  Clinician inquired about Courtney [name]'s successes and challenges in previous week.  Courtney [name] reported that she has been trying to focus upon self-care, including regular meditation, yoga, and resting, but was feeling tired today because she stayed up late into the night hanging out with a new friend that she just met the day before.  Clinician inquired about how they originally met, and whether she is continuing to practice reinforcement of healthier boundaries due to past issues that have arisen when getting close to new people too quickly.  Aneth reported that she met this person on a social media dating site and got 'good vibes from them', so she decided to hang out with him since there did not appear to be red flags immediately noticeable as in previous relationships.  Clinician reminded Kalaysia of her previously stated personal goals involving increased focus upon herself, curbing codependency, and being mindful of stress that new relationships can bring along, which could impact stability of current mental health.  Courtney [name] acknowledged that she may have rushed into things impulsively and stated "You can't heal or fix people when you haven't worked enough on yourself first". Interventions were effective, as evidenced by Courtney [name] reporting that  she would try to slow down development of this new relationship and closely monitor its impact  upon how she feels day to day.  Austine reported that she would continue prioritizing self-care, and begin new work out regimen at home since it has been hard to secure transportation to the gym nearby.  She ended session at halfway mark due to fatigue and agreed to follow up in 1 week.  Clinician will continue to monitor.                       Plan: Meet again in 1 week virtually.   Diagnosis: Bipolar I Disorder, mixed, mild; Borderline personality disorder; PTSD; ADHD, combined type.   [name], LCSW, LCAS 05/24/20

## 2020-05-31 ENCOUNTER — Ambulatory Visit (INDEPENDENT_AMBULATORY_CARE_PROVIDER_SITE_OTHER): Payer: Medicaid Other | Admitting: Licensed Clinical Social Worker

## 2020-05-31 ENCOUNTER — Other Ambulatory Visit: Payer: Self-pay

## 2020-05-31 DIAGNOSIS — F603 Borderline personality disorder: Secondary | ICD-10-CM | POA: Diagnosis not present

## 2020-05-31 DIAGNOSIS — F902 Attention-deficit hyperactivity disorder, combined type: Secondary | ICD-10-CM | POA: Diagnosis not present

## 2020-05-31 DIAGNOSIS — F3161 Bipolar disorder, current episode mixed, mild: Secondary | ICD-10-CM | POA: Diagnosis not present

## 2020-05-31 DIAGNOSIS — F4310 Post-traumatic stress disorder, unspecified: Secondary | ICD-10-CM

## 2020-05-31 NOTE — Progress Notes (Signed)
Virtual Visit via Video Note   I connected with Courtney [name] on 05/31/20 at 11:00am by video enabled telemedicine application and verified that I am speaking with the correct person using two identifiers.   I discussed the limitations, risks, security and privacy concerns of performing an evaluation and management service by video and the availability of in person appointments. I also discussed with the patient that there may be a patient responsible charge related to this service. The patient expressed understanding and agreed to proceed.   I discussed the assessment and treatment plan with the patient. The patient was provided an opportunity to ask questions and all were answered. The patient agreed with the plan and demonstrated an understanding of the instructions.   The patient was advised to call back or seek an in-person evaluation if the symptoms worsen or if the condition fails to improve as anticipated.   I provided 30 minutes of non-face-to-face time during this encounter.   [name], LCSW, LCAS ________________________ THERAPIST PROGRESS NOTE   Session Time: 11:00am - 11:30am  Location: Patient: Patient Home Provider: [facility]    Participation Level: Active   Behavioral Response: Alert, neatly groomed, casually dressed, euthymic affect/mood   Type of Therapy:  Individual Therapy   Treatment Goals addressed: Depression and Anxiety management; Medication management; Exercise   Interventions: CBT, problem solving    Summary: Courtney [name] is a 29 year old female that presented for virtual session today and is diagnosed with Bipolar I Disorder, mixed, mild; Borderline personality disorder; PTSD; and ADHD, combined type.        Suicidal/Homicidal: None; without intent or plan   Therapist Response: Clinician spoke with Courtney [name] for virtual session today and assessed for safety, sobriety, and medication compliance.  Courtney [name] presented for this appointment on  time and was alert, oriented x5, with no evidence or self-report of SI/HI or A/V H.  Courtney [name] reported ongoing compliance with medication, and decreased frequency of marijuana use to roughly 2-3x per week.  Clinician inquired about Courtney [name]'s emotional ratings today, as well as any significant changes in thoughts, feelings, or behavior since previous check-in.  Courtney [name] reported scores of 0/10 for both depression and anxiety, and denied any reoccurrence of panic attacks, which is consistent with last check-in, stating "Things have been going for good overall".  Clinician inquired about Courtney [name]'s successes and struggles over past week.  Courtney [name] reported that she has not been dealing with any stressors, but she has also felt unmotivated and sleeps 10-12 hours on average most days, leaving her feeling 'wiped out' and lethargic for the rest of her day.  Clinician assisted Courtney [name] with developing a productivity plan this week to improve motivation, sleep schedule, energy, and self-care routine.  Clinician discussed sleep hygiene adjustments Courtney [name] could make to increase structure and resolve erratic sleep patterns, including avoiding use of electronic devices close to bedtime, and committing to a regular wake/sleep time each day to follow.  Clinician inquired about positive self-care activities Sindy could include in schedule this week which would increase motivation to get out of bed and give her something to look forward to.  Interventions were effective, as evidenced by Courtney [name] reporting that this was helpful for giving her ideas on how to improve daily schedule, noting that she would try to go to bed at midnight, wake up at 8am each day, and focus her energy on activities such as meditation, making jewelry, taking walks, visiting the gym with her friend, and planning for Halloween.  She  reported that she would try to begin putting together a Halloween costume today, get pumpkins for carving, and  take her cat outside on a walk.  She denied having any other issues that needed to be addressed at this time.  Clinician will continue to monitor.                       Plan: Meet again in 1 week virtually.   Diagnosis: Bipolar I Disorder, mixed, mild; Borderline personality disorder; PTSD; ADHD, combined type.   [name], LCSW, LCAS 05/31/20

## 2020-06-07 ENCOUNTER — Other Ambulatory Visit: Payer: Self-pay

## 2020-06-07 ENCOUNTER — Ambulatory Visit (INDEPENDENT_AMBULATORY_CARE_PROVIDER_SITE_OTHER): Payer: Medicaid Other | Admitting: Licensed Clinical Social Worker

## 2020-06-07 DIAGNOSIS — F603 Borderline personality disorder: Secondary | ICD-10-CM | POA: Diagnosis not present

## 2020-06-07 DIAGNOSIS — F902 Attention-deficit hyperactivity disorder, combined type: Secondary | ICD-10-CM

## 2020-06-07 DIAGNOSIS — F3161 Bipolar disorder, current episode mixed, mild: Secondary | ICD-10-CM

## 2020-06-07 DIAGNOSIS — F4310 Post-traumatic stress disorder, unspecified: Secondary | ICD-10-CM | POA: Diagnosis not present

## 2020-06-07 NOTE — Progress Notes (Signed)
Virtual Visit via Video Note   I connected with Courtney [name] on 06/07/20 at 11:00am by video enabled telemedicine application and verified that I am speaking with the correct person using two identifiers.   I discussed the limitations, risks, security and privacy concerns of performing an evaluation and management service by video and the availability of in person appointments. I also discussed with the patient that there may be a patient responsible charge related to this service. The patient expressed understanding and agreed to proceed.   I discussed the assessment and treatment plan with the patient. The patient was provided an opportunity to ask questions and all were answered. The patient agreed with the plan and demonstrated an understanding of the instructions.   The patient was advised to call back or seek an in-person evaluation if the symptoms worsen or if the condition fails to improve as anticipated.   I provided 30 minutes of non-face-to-face time during this encounter.   [name], LCSW, LCAS ________________________ THERAPIST PROGRESS NOTE   Session Time: 11:00am - 11:30am  Location: Patient: Patient Home Provider: [facility]    Participation Level: Active   Behavioral Response: Alert, neatly groomed, casually dressed, manic affect/mood   Type of Therapy:  Individual Therapy   Treatment Goals addressed: Depression and Anxiety management; Medication management   Interventions: CBT, mania psychoeducation and management   Summary: Courtney [name] is a 29 year old female that presented for virtual session today and is diagnosed with Bipolar I Disorder, mixed, mild; Borderline personality disorder; PTSD; and ADHD, combined type.        Suicidal/Homicidal: None; without intent or plan   Therapist Response: Clinician spoke with Courtney [name] for virtual therapy appointment today and assessed for safety, sobriety, and medication compliance.  Courtney [name] presented for  session on time and was alert, oriented x5, with no evidence or self-report of SI/HI or A/V H.  Courtney [name] disclosed that she has not been taking her medication for bipolar disorder consistently or on regular schedule in past week, which has led her to self-medicate with marijuana x4-5 to stay more balanced in mood.  Clinician inquired about Courtney [name]'s current emotional ratings, as well as any significant changes in thoughts, feelings, or behavior since last check-in.  Courtney [name] reported scores of 0/10 for depression, 7/10 for anxiety, and 10/10 for mania, but denied any reoccurrence of panic attacks.  Courtney [name] reported that due to mania, she has been without sleep for 36-40 hours now, and was highly distractible in session.  Clinician provided psychoeducation on negative influence that THC use can have on bipolar disorder, including greater overall illness severity, increased cycling, poorer daily functioning, and decreased adherence with prescribed treatments.  Clinician also assisted Courtney [name] with developing an action plan to help her manage mania safely in meantime and facilitate transition back to balanced state via steps such as returning to prescribed medication daily regimen, avoiding use of alcohol or illicit substances, focusing energy on positive, healthy activities while avoiding impulsivity, checking in with support system, and using relaxation techniques to induce calmer state which will hopefully allow for rest and return to normal sleep schedule.  Interventions were effective, as evidenced by Courtney [name] reporting that although she does tend to 'self-sabotage' by not taking medication and enjoys the feeling of her manic episodes, it can "Be a lot for other people to deal with", and cause more stress in the long run for her.  Courtney [name] was receptive to suggestions for managing mania at this time, and reported that she  would begin taking medication as prescribed again, and meditate after today's  session to help her fall asleep.  Clinician will continue to monitor.                       Plan: Meet again in 1 week virtually.   Diagnosis: Bipolar I Disorder, mixed, mild; Borderline personality disorder; PTSD; ADHD, combined type.   [name], LCSW, LCAS 06/07/20

## 2020-06-14 ENCOUNTER — Ambulatory Visit (INDEPENDENT_AMBULATORY_CARE_PROVIDER_SITE_OTHER): Payer: Medicaid Other | Admitting: Licensed Clinical Social Worker

## 2020-06-14 ENCOUNTER — Other Ambulatory Visit: Payer: Self-pay

## 2020-06-14 DIAGNOSIS — F603 Borderline personality disorder: Secondary | ICD-10-CM

## 2020-06-14 DIAGNOSIS — F4310 Post-traumatic stress disorder, unspecified: Secondary | ICD-10-CM

## 2020-06-14 DIAGNOSIS — F902 Attention-deficit hyperactivity disorder, combined type: Secondary | ICD-10-CM | POA: Diagnosis not present

## 2020-06-14 DIAGNOSIS — F3161 Bipolar disorder, current episode mixed, mild: Secondary | ICD-10-CM | POA: Diagnosis not present

## 2020-06-14 NOTE — Progress Notes (Signed)
Virtual Visit via Video Note   I connected with Courtney [name] on 06/14/20 at 11:00am by video enabled telemedicine application and verified that I am speaking with the correct person using two identifiers.   I discussed the limitations, risks, security and privacy concerns of performing an evaluation and management service by video and the availability of in person appointments. I also discussed with the patient that there may be a patient responsible charge related to this service. The patient expressed understanding and agreed to proceed.   I discussed the assessment and treatment plan with the patient. The patient was provided an opportunity to ask questions and all were answered. The patient agreed with the plan and demonstrated an understanding of the instructions.   The patient was advised to call back or seek an in-person evaluation if the symptoms worsen or if the condition fails to improve as anticipated.   I provided 35 minutes of non-face-to-face time during this encounter.   Courtney Stain, LCSW, LCAS ________________________ THERAPIST PROGRESS NOTE   Session Time: 11:00am - 11:35am  Location: Patient: Patient Home Provider: [facility]    Participation Level: Active   Behavioral Response: Alert, neatly groomed, casually dressed, euthymic affect/mood   Type of Therapy:  Individual Therapy   Treatment Goals addressed: Depression and Anxiety management; Medication management; Improving communication skills   Interventions: CBT, problem solving, conflict resolution   Summary: Courtney [name] is a 29 year old female that presented for virtual session today and is diagnosed with Bipolar I Disorder, mixed, mild; Borderline personality disorder; PTSD; and ADHD, combined type.        Suicidal/Homicidal: None; without intent or plan   Therapist Response: Clinician spoke with Courtney [name] for virtual therapy session today and assessed for safety, sobriety, and medication  compliance.  Fantasy presented for todays appointment on time and was alert, oriented x5, with no evidence or self-report of SI/HI or A/V H.  Courtney [name] reported that she has begun taking her medication as prescribed again and is smoking marijuana x3-4 per week.  Clinician inquired about Edra's emotional ratings today, as well as any significant changes in thoughts, feelings, or behavior since previous check-in.  Courtney [name] reported scores of 0/10 for depression, anxiety, and mania, stating "I have had a few breakdowns over the past week but I think it was because of me skipping out on my medicine a few days before we talked last time".  Courtney [name] reported that her sleeping patterns have been more normal as well, as she tries limiting time on phone before bed, and sleeps 6-7 hours per night on average.  Courtney [name] reported that one struggle for her at this time is figuring out whether to go visit family for Thanksgiving, as her parents invited her out, but she anticipates encountering her brother, whom she is not on good terms with.  Clinician assisted Courtney [name] in weighing pros and cons of this opportunity to aid in making well informed decision, as well as exploring ways to deal with difficult people such as her brother in case she does choose to attend.  Some of these approaches included using relaxation techniques to remain calm, avoiding judgement and contempt, looking for support from others around her, avoiding defensiveness or engaging in argument, watching non verbal behavior that could fuel more conflict, keeping physical space between each other, and setting appropriate time limits and boundaries ahead of meeting.  Interventions were effective, as evidenced by Courtney [name] reporting that she felt the benefits of this dinner greatly outweighed the risk  of encountering her brother after performing analysis, and better prepared to handle his personality based upon suggestions made, stating I dont have to  respond or react to anything he says-I can choose to brush it off and I know I only hurt myself if I let him upset me.  Clinician will continue to monitor.                       Plan: Meet again in 1 week virtually.   Diagnosis: Bipolar I Disorder, mixed, mild; Borderline personality disorder; PTSD; ADHD, combined type.   Courtney [name], [place], LCAS 06/14/20

## 2020-06-18 ENCOUNTER — Other Ambulatory Visit: Payer: Self-pay

## 2020-06-18 ENCOUNTER — Telehealth (INDEPENDENT_AMBULATORY_CARE_PROVIDER_SITE_OTHER): Payer: Medicaid Other | Admitting: Psychiatry

## 2020-06-18 DIAGNOSIS — F902 Attention-deficit hyperactivity disorder, combined type: Secondary | ICD-10-CM | POA: Diagnosis not present

## 2020-06-18 DIAGNOSIS — F422 Mixed obsessional thoughts and acts: Secondary | ICD-10-CM | POA: Diagnosis not present

## 2020-06-18 DIAGNOSIS — F319 Bipolar disorder, unspecified: Secondary | ICD-10-CM

## 2020-06-18 DIAGNOSIS — F603 Borderline personality disorder: Secondary | ICD-10-CM

## 2020-06-18 MED ORDER — CLONAZEPAM 1 MG PO TABS: 1.0000 mg | ORAL_TABLET | Freq: Two times a day (BID) | ORAL | 1 refills | Status: DC | PRN

## 2020-06-18 MED ORDER — AMPHETAMINE-DEXTROAMPHET ER 15 MG PO CP24: 15.0000 mg | ORAL_CAPSULE | ORAL | 0 refills | Status: DC

## 2020-06-18 MED ORDER — DULOXETINE HCL 30 MG PO CPEP: 90.0000 mg | ORAL_CAPSULE | Freq: Every day | ORAL | 1 refills | Status: DC
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
## 2020-06-21 ENCOUNTER — Other Ambulatory Visit: Payer: Self-pay

## 2020-06-21 ENCOUNTER — Ambulatory Visit (INDEPENDENT_AMBULATORY_CARE_PROVIDER_SITE_OTHER): Payer: Medicaid Other | Admitting: Licensed Clinical Social Worker

## 2020-06-21 DIAGNOSIS — F4310 Post-traumatic stress disorder, unspecified: Secondary | ICD-10-CM | POA: Diagnosis not present

## 2020-06-21 DIAGNOSIS — F3161 Bipolar disorder, current episode mixed, mild: Secondary | ICD-10-CM | POA: Diagnosis not present

## 2020-06-21 DIAGNOSIS — F902 Attention-deficit hyperactivity disorder, combined type: Secondary | ICD-10-CM | POA: Diagnosis not present

## 2020-06-21 DIAGNOSIS — F603 Borderline personality disorder: Secondary | ICD-10-CM | POA: Diagnosis not present
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
## 2020-06-28 ENCOUNTER — Ambulatory Visit (HOSPITAL_COMMUNITY): Payer: Medicaid Other | Admitting: Licensed Clinical Social Worker

## 2020-06-28 ENCOUNTER — Other Ambulatory Visit: Payer: Self-pay

## 2020-06-28 DIAGNOSIS — F3161 Bipolar disorder, current episode mixed, mild: Secondary | ICD-10-CM

## 2020-06-28 DIAGNOSIS — F4310 Post-traumatic stress disorder, unspecified: Secondary | ICD-10-CM

## 2020-06-28 DIAGNOSIS — F902 Attention-deficit hyperactivity disorder, combined type: Secondary | ICD-10-CM

## 2020-06-28 DIAGNOSIS — F603 Borderline personality disorder: Secondary | ICD-10-CM

## 2020-06-28 NOTE — Progress Notes (Signed)
Virtual Visit via Video Note   I connected with Courtney [name] on 06/28/20 at 11:00am by video enabled telemedicine application and verified that I am speaking with the correct person using two identifiers.   I discussed the limitations, risks, security and privacy concerns of performing an evaluation and management service by video and the availability of in person appointments. I also discussed with the patient that there may be a patient responsible charge related to this service. The patient expressed understanding and agreed to proceed.   I discussed the assessment and treatment plan with the patient. The patient was provided an opportunity to ask questions and all were answered. The patient agreed with the plan and demonstrated an understanding of the instructions.   The patient was advised to call back or seek an in-person evaluation if the symptoms worsen or if the condition fails to improve as anticipated.   I provided 10 minutes of non-face-to-face time during this encounter.   Courtney Stain, LCSW, LCAS ________________________ THERAPIST PROGRESS NOTE   Session Time: 11:00am - 11:10am  Location: Patient: Patient Home Provider: [facility]    Participation Level: Active   Behavioral Response: Alert, neatly groomed, casually dressed, lethargic affect/mood   Type of Therapy:  Individual Therapy   Treatment Goals addressed: Depression and Anxiety management; Medication management   Interventions: CBT    Summary: Courtney [name] is a 29 year old female that presented for virtual session today and is diagnosed with Bipolar I Disorder, mixed, mild; Borderline personality disorder; PTSD; and ADHD, combined type.        Suicidal/Homicidal: None; without intent or plan   Therapist Response: Clinician spoke with Courtney [name] for virtual therapy session today and assessed for safety, sobriety, and medication compliance.  [name] presented for virtual appointment on time and was  alert, oriented x5, with no evidence or self-report of SI/HI or A/V H.  Ahlana reported that she has continued taking medication as prescribed, and is limiting marijuana use to x3 per week.  Clinician inquired about [name]'s emotional ratings today, as well as any significant changes in thoughts, feelings, or behavior since previous check-in.  Courtney [name] reported scores of 1/10 for depression, and 0/10 for both anxiety, and mania, stating "Courtney [name] been pretty productive lately".  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  She reported that she has been unable to go to the [facility] to gain membership yet due to transportation issues, but she is trying to take walks with her cat to increase exercise.  Courtney [name] reported shortly after beginning session that she has not been feeling well today due to symptoms of fatigue, weakness, and cough that appeared over past day, so she wished to cut session short.  Clinician was agreeable with Courtney [name] not pushing herself while feeling ill, and encouraged her to monitor symptoms closely, get plenty of rest, stay hydrated, and consider outreaching PCP and/or visiting urgent care if condition does not improve.  Courtney [name] was receptive to this suggestion and agreed to follow up for therapy again next week.  Clinician will continue to monitor.                       Plan: Meet again in 1 week virtually.   Diagnosis: Bipolar I Disorder, mixed, mild; Borderline personality disorder; PTSD; ADHD, combined type.   Courtney [name], [place], LCAS 06/28/20

## 2020-07-11 ENCOUNTER — Encounter: Payer: Self-pay | Admitting: Family Medicine

## 2020-07-12 ENCOUNTER — Ambulatory Visit (INDEPENDENT_AMBULATORY_CARE_PROVIDER_SITE_OTHER): Payer: Medicaid Other | Admitting: Licensed Clinical Social Worker

## 2020-07-12 ENCOUNTER — Other Ambulatory Visit: Payer: Self-pay

## 2020-07-12 DIAGNOSIS — F3161 Bipolar disorder, current episode mixed, mild: Secondary | ICD-10-CM | POA: Diagnosis not present

## 2020-07-12 DIAGNOSIS — F603 Borderline personality disorder: Secondary | ICD-10-CM | POA: Diagnosis not present

## 2020-07-12 DIAGNOSIS — F902 Attention-deficit hyperactivity disorder, combined type: Secondary | ICD-10-CM | POA: Diagnosis not present

## 2020-07-12 DIAGNOSIS — F4310 Post-traumatic stress disorder, unspecified: Secondary | ICD-10-CM | POA: Diagnosis not present

## 2020-07-12 NOTE — Progress Notes (Signed)
Virtual Visit via Telephone Note   I connected with Courtney [name] on 07/12/20 at 11:00am by telephone and verified that I am speaking with the correct person using two identifiers.   I discussed the limitations, risks, security and privacy concerns of performing an evaluation and management service by telephone and the availability of in person appointments. I also discussed with the patient that there may be a patient responsible charge related to this service. The patient expressed understanding and agreed to proceed.   I discussed the assessment and treatment plan with the patient. The patient was provided an opportunity to ask questions and all were answered. The patient agreed with the plan and demonstrated an understanding of the instructions.   The patient was advised to call back or seek an in-person evaluation if the symptoms worsen or if the condition fails to improve as anticipated.   I provided 30 minutes of non-face-to-face time during this encounter.   Courtney Stain, LCSW, LCAS ________________________ THERAPIST PROGRESS NOTE   Session Time: 11:00am - 11:30am  Location: Patient: Patient Home Provider: [facility]    Participation Level: Active   Behavioral Response: Alert, neatly groomed, casually dressed, lethargic affect/mood   Type of Therapy:  Individual Therapy   Treatment Goals addressed: Depression and Anxiety management; Medication management   Interventions: CBT, DBT: Radical Acceptance     Summary: Courtney [name] is a 29 year old female that presented for telephone appointment and is diagnosed with Bipolar I Disorder, mixed, mild; Borderline personality disorder; PTSD; and ADHD, combined type.        Suicidal/Homicidal: None; without intent or plan   Therapist Response: Clinician spoke with Courtney [name] via telephone for therapy today, as she was unable to access virtual session reliably.  Clinician assessed for safety, sobriety, and medication compliance.   Courtney [name] spoke in a manner that was alert, oriented x5, with no evidence or self-report of SI/HI or A/V H.  Courtney [name] reported ongoing compliance with medication and reported using marijuana x2 per day, but denies any negative impact on mental health or daily functioning.  Clinician inquired about Courtney [name]'s current emotional ratings, as well as any significant changes in thoughts, feelings, or behavior since last check-in.  Courtney [name] reported scores of 1/10 for both depression, and anxiety, but denied experiencing mania. She reported that she feels like she is getting sick, as she felt fatigued and had a cough in session, but is staying hydrated, eating regularly, taking medication and vitamins, as well as getting plenty of rest.  Clinician encouraged her to seek medical assistance if condition worsens, and inquired about recent successes and struggles over past week.  Courtney [name] reported that she got business cards printed for herself, and attended thanksgiving with her parents, which was a positive experience.  She reported that one negative was having her brother call her mother during dinner, and receiving a negative response when she told him hello.  Courtney [name] reported that she would like to work on acceptance in regard to the relationship with her brother, since their interactions tend to be triggering for her.  Clinician utilized handout on concept of 'radical acceptance' today in order to help Courtney [name] work towards this goal by viewing recent interaction through a different, more balanced perspective.  This handout featured sequential questions intended to encourage reflection, including detailing what happened, how both parties were involved, what action(s) was taken, the outcome of this event, what is and is not within one's control, and how the situation could be handled differently if  radical acceptance was utilized instead.  Interventions were effective, as evidenced by Courtney [name] opening up honestly  about events that unfolded during course of questioning, acknowledging that she can only control her own actions/reactions, and making the decision to maintain boundaries with her brother in the meantime until he treats her with more respect.  Courtney [name] stated "If I just accept that this is the way he is right now, and keep our distance, I'll be less upset, more understanding, and probably feel a whole lot better too".  Clinician will continue to monitor.                       Plan: Meet again in 1 week virtually.   Diagnosis: Bipolar I Disorder, mixed, mild; Borderline personality disorder; PTSD; ADHD, combined type.   Courtney Stain, LCSW, LCAS 07/12/20

## 2020-07-19 ENCOUNTER — Ambulatory Visit (INDEPENDENT_AMBULATORY_CARE_PROVIDER_SITE_OTHER): Payer: Medicaid Other | Admitting: Licensed Clinical Social Worker

## 2020-07-19 ENCOUNTER — Other Ambulatory Visit: Payer: Self-pay

## 2020-07-19 DIAGNOSIS — F603 Borderline personality disorder: Secondary | ICD-10-CM | POA: Diagnosis not present

## 2020-07-19 DIAGNOSIS — F902 Attention-deficit hyperactivity disorder, combined type: Secondary | ICD-10-CM

## 2020-07-19 DIAGNOSIS — F4310 Post-traumatic stress disorder, unspecified: Secondary | ICD-10-CM

## 2020-07-19 DIAGNOSIS — F3161 Bipolar disorder, current episode mixed, mild: Secondary | ICD-10-CM

## 2020-07-19 NOTE — Progress Notes (Signed)
Virtual Visit via Video Note   I connected with Courtney [name] on 07/19/20 at 11:00am by video enabled telemedicine application and verified that I am speaking with the correct person using two identifiers.   I discussed the limitations, risks, security and privacy concerns of performing an evaluation and management service by video and the availability of in person appointments. I also discussed with the patient that there may be a patient responsible charge related to this service. The patient expressed understanding and agreed to proceed.   I discussed the assessment and treatment plan with the patient. The patient was provided an opportunity to ask questions and all were answered. The patient agreed with the plan and demonstrated an understanding of the instructions.   The patient was advised to call back or seek an in-person evaluation if the symptoms worsen or if the condition fails to improve as anticipated.   I provided 1 hour of non-face-to-face time during this encounter.   [name], LCSW, LCAS ________________________ THERAPIST PROGRESS NOTE   Session Time: 11:00am - 12:00pm  Location: Patient: Patient Home Provider: [facility] [place] [facility]    Participation Level: Active   Behavioral Response: Alert, neatly groomed, casually dressed, euthymic affect/mood   Type of Therapy:  Individual Therapy   Treatment Goals addressed: Depression and Anxiety management; Medication management; Decreasing substance use; Exercise routine; Increasing independence    Interventions: CBT, health and wellness/pain management    Summary: Courtney [name] is a 29 year old female that presented for video appointment and is diagnosed with Bipolar I Disorder, mixed, mild; Borderline personality disorder; PTSD; and ADHD, combined type.        Suicidal/Homicidal: None; without intent or plan   Therapist Response: Clinician met with [name] for virtual session today and assessed for safety, sobriety, and  medication compliance.  [name] presented for appointment on time and was alert, oriented x5, with no evidence or self-report of SI/HI or A/V H.  [name] reported that she has been taking medication as prescribed and has not used marijuana in 2 days now.  Clinician inquired about Gabryella's emotional ratings today, as well as any significant changes in thoughts, feelings, or behavior since previous check-in.  Haiden reported scores of 0/10 for depression, anxiety, anger/irritability and mania. Clinician inquired about [name]'s successes and struggles over last week. 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  She reported that there are many details to iron out, but she is not expected to travel out there until March 2022, stating "It just feels good that someone sees something in me and it feels good to be acknowledged".  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  [name] reported that one struggle is dealing with chronic pain from her arthritis, which she has self-medicated with marijuana use at times.  Clinician provided psychoeducation on the impact that physical pain can have on mental health and substance use and explored alternative, healthier strategies with [name] to utilize for pain management, such engaging in low impact exercise regimen, eating a healthy diet that reduces inflammation, warming up or cooling down joints, stretching and/or doing yoga, meditating, setting up massage or acupuncture appointments, and more.  Interventions were effective, as evidenced by [name] expressing openness to several of these suggestions, stating "I wanna start stretching and moving more.  I  have all this equipment, but I need to actually put my mind to it".  [name]  also reported that she would follow up with her doctor for additional suggestions, noting that they promoted benefits of bariatric surgery previously. Clinician encouraged her to stay in touch with PCP to continue discussion on health and wellness, and will continue to monitor.                       Plan: Meet again in 1 week virtually.   Diagnosis: Bipolar I Disorder, mixed, mild; Borderline personality disorder; PTSD; ADHD, combined type.   [name], LCSW, LCAS 07/19/20

## 2020-07-26 ENCOUNTER — Other Ambulatory Visit: Payer: Self-pay

## 2020-07-26 ENCOUNTER — Ambulatory Visit (INDEPENDENT_AMBULATORY_CARE_PROVIDER_SITE_OTHER): Payer: Medicaid Other | Admitting: Licensed Clinical Social Worker

## 2020-07-26 DIAGNOSIS — F4310 Post-traumatic stress disorder, unspecified: Secondary | ICD-10-CM

## 2020-07-26 DIAGNOSIS — F902 Attention-deficit hyperactivity disorder, combined type: Secondary | ICD-10-CM

## 2020-07-26 DIAGNOSIS — F603 Borderline personality disorder: Secondary | ICD-10-CM

## 2020-07-26 DIAGNOSIS — F3161 Bipolar disorder, current episode mixed, mild: Secondary | ICD-10-CM | POA: Diagnosis not present

## 2020-07-26 NOTE — Progress Notes (Signed)
Virtual Visit via Video Note   I connected with Courtney [name] on 07/26/20 at 11:09am by video enabled telemedicine application and verified that I am speaking with the correct person using two identifiers.   I discussed the limitations, risks, security and privacy concerns of performing an evaluation and management service by video and the availability of in person appointments. I also discussed with the patient that there may be a patient responsible charge related to this service. The patient expressed understanding and agreed to proceed.   I discussed the assessment and treatment plan with the patient. The patient was provided an opportunity to ask questions and all were answered. The patient agreed with the plan and demonstrated an understanding of the instructions.   The patient was advised to call back or seek an in-person evaluation if the symptoms worsen or if the condition fails to improve as anticipated.   I provided 31 minutes of non-face-to-face time during this encounter.   [name], LCSW, LCAS ________________________ THERAPIST PROGRESS NOTE   Session Time: 11:09am - 11:40am   Location: Patient: Patient Home Provider: [facility]    Participation Level: Active   Behavioral Response: Alert, neatly groomed, casually dressed, anxious affect/mood   Type of Therapy:  Individual Therapy   Treatment Goals addressed: Depression and Anxiety management; Medication management; Exercise routine; Increasing independence    Interventions: CBT, embracing forgiveness    Summary: Courtney [name] is a 29 year old female that presented for video appointment and is diagnosed with Bipolar I Disorder, mixed, mild; Borderline personality disorder; PTSD; and ADHD, combined type.        Suicidal/Homicidal: None; without intent or plan   Therapist Response: Clinician met with Courtney [name] for virtual therapy appointment today and assessed for safety, sobriety, and medication compliance.   Courtney presented for session on time and was alert, oriented x5, with no evidence or self-report of SI/HI or A/V H.  [name] reported ongoing compliance with medication and noted that she has abstained from using marijuana for several days, stating "I try not to think about it".  Clinician inquired about Courtney [name]'s current emotional ratings, as well as any significant changes in thoughts, feelings, or behavior since last check-in.  [name] reported scores of 1/10 for depression, 2/10 for anxiety, and 1/10 for anger/irritability, but denied any symptoms of mania. She reported that she last had a panic attack on over the weekend, stating "I got overwhelmed for a little bit and cried, but it wasn't anything major".  Clinician inquired about Courtney [name]'s successes and struggles over past week. 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  She reported that she has been in pain due to arthritis and this has prevented her from beginning to work out more often, but has an appointment scheduled for a lumbar spine injection today.  Courtney [name] reported that lately she has felt more lonely, but is hesitant to get very close due to history of having trust broken, and finding it difficult to forgive others for past actions.  Clinician explored this subject with Courtney [name] today by utilizing a handout which defined what forgiveness is, and what it is not, as well as the benefits of embracing forgiveness during treatment, such as letting go of negative feelings like resentment, anger, and hostility so that she can move forward with forming new healthier relationships. Strategies for processing forgiveness effectively were also offered, including taking time to journal about significant events to  find positives, cultivating empathy and understanding, and taking steps to modify existing or new relationships in order to protect  oneself from similar events repeating.  Interventions were effective, as evidenced by Courtney [name] reporting that she found this to be helpful for opening up about past injustices in a safe space, getting ideas on how to facilitate forgiveness process, and stating "Two wrongs don't make a right.  Holding a grudge doesn't help, and just ends up messing with my energy in a bad way.  I feel like this is a subject I'm going to have to keep working at".  Courtney [name] ended session early when transportation appeared outside home for appointment today, noting that there would be other passengers present that she did not feel comfortable continuing discussion around.  She agreed to follow up next week.  Clinician will continue to monitor.                       Plan: Meet again in 1 week virtually.   Diagnosis: Bipolar I Disorder, mixed, mild; Borderline personality disorder; PTSD; ADHD, combined type.   [name], [place], LCAS 07/26/20

## 2020-07-31 NOTE — Progress Notes (Signed)
Name: Courtney [name]   MRN: [id number]    DOB: March 16, 1991   Date:08/02/2020       Progress Note  Subjective  Chief Complaint  Acute visit for rash and weight loss discussion  HPI  Morbid obesity: she has gained another 9 lbs since her last visit. She is really interested in having bariatric surgery but not sure if covered by insurance. Her BMI is above 40. She is eating smaller meals and drinking more water. She has difficulty exercising because of FMS. She states she will try to walk more, but difficult walking in the cold . She has tried keto diet but unable to stay on it long term, discussed trying weight watchers. Discussed medications, she denies having family history of MEN syndrome, she never had pancreatitis.   Rash: started about one month ago, initially painful, raised, red now scaly in areas, initially on abdomen and spread thighs and lower legs. No longer spreading, and not as red, but she is concerned because never had anything like that before     Patient Active Problem List   Diagnosis Date Noted  . Cyst, baker's knee, right 03/13/2020  . Mitral valve prolapse 03/13/2020  . Fibromyalgia syndrome 09/14/2019  . Menstrual cramps 09/12/2019  . GERD (gastroesophageal reflux disease) 09/12/2019  . 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 Fatty liver   . Encounter for insertion of copper intrauterine contraceptive device (IUD) 07/19/2019  . Memory loss or impairment 05/04/2019  . Borderline personality disorder (HCC) 04/08/2019  . Bipolar 1 disorder (HCC) 11/21/2018  . Cannabis use disorder, mild, abuse   . B12 deficiency 10/11/2018  . Vitamin D deficiency 10/11/2018  . 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 Rectal polyp   . Severe anxiety 03/19/2018  . Chronic post-traumatic stress disorder (PTSD)   . OCD (obsessive compulsive disorder) 12/31/2017  . Attention deficit hyperactivity disorder (ADHD), combined type 12/31/2017  . Prediabetes 12/31/2017    Past Surgical  History:  Procedure Laterality Date  . APPENDECTOMY  02/17/2014  . COLONOSCOPY WITH PROPOFOL N/A 09/03/2018   Procedure: COLONOSCOPY WITH PROPOFOL;  Surgeon: Pasty Spillers, MD;  Location: [facility];  Service: Endoscopy;  Laterality: N/A;  . ESOPHAGOGASTRODUODENOSCOPY (EGD) WITH PROPOFOL N/A 09/03/2018   Procedure: ESOPHAGOGASTRODUODENOSCOPY (EGD) WITH PROPOFOL;  Surgeon: Pasty Spillers, MD;  Location: [facility];  Service: Endoscopy;  Laterality: N/A;  . EUS N/A 10/14/2018   Procedure: FULL UPPER ENDOSCOPIC ULTRASOUND (EUS) RADIAL;  Surgeon: Rayann Heman, MD;  Location: [facility];  Service: Endoscopy;  Laterality: N/A;  . INTRAUTERINE DEVICE (IUD) INSERTION N/A 11/15/2019   Procedure: INTRAUTERINE DEVICE (IUD) INSERTION (PARA GARD);  Surgeon: Tilda Burrow, MD;  Location: [facility];  Service: Gynecology;  Laterality: N/A;  . LABIOPLASTY Bilateral 11/15/2019   Procedure: BILATERAL REDUCTION LABIAPLASTY;  Surgeon: Tilda Burrow, MD;  Location: [facility];  Service: Gynecology;  Laterality: Bilateral;  . pyloric stenosis repair      Family History  Problem Relation Age of Onset  . COPD Mother   . Hypertension Mother   . Asthma Mother   . Arthritis Mother   . Pancreatic cancer Mother        slow growing  . ADD / ADHD Mother   . Other Mother        Spinal Stenosis - currently in surgery today  . Bipolar disorder Mother   . Anxiety disorder Mother   . Depression Mother   . Parkinson's disease Mother   . GER disease Mother   .  Asthma Brother   . Hernia Brother   . ADD / ADHD Brother   . Bipolar disorder Brother   . Hypertension Maternal Grandmother   . Heart disease Maternal Grandmother 66  . Rheumatic fever Maternal Grandmother   . Heart attack Maternal Grandmother        Bypass Surgery  . Pancreatic cancer Maternal Grandfather   . Liver cancer Maternal Grandfather   . Asthma Maternal Grandfather   . Obesity Paternal Grandmother     Social History    Tobacco Use  . Smoking status: Former Smoker    Years: 0.50    Types: Cigarettes    Start date: 01/01/2008  . Smokeless tobacco: Never Used  . Tobacco comment: Hookah only smoked at get togethers  Substance Use Topics  . Alcohol use: Not Currently     Current Outpatient Medications:  .  amphetamine-dextroamphetamine (ADDERALL XR) 15 MG 24 hr capsule, Take 1 capsule by mouth every morning., Disp: 30 capsule, Rfl: 0 .  [START ON 08/13/2020] amphetamine-dextroamphetamine (ADDERALL XR) 15 MG 24 hr capsule, Take 1 capsule by mouth every morning., Disp: 30 capsule, Rfl: 0 .  Ascorbic Acid (VITAMIN C) 1000 MG tablet, Take 1,000 mg by mouth daily., Disp: , Rfl:  .  BIOTIN 5000 PO, Take by mouth., Disp: , Rfl:  .  cariprazine (VRAYLAR) capsule, Take 1 capsule (3 mg total) by mouth at bedtime., Disp: 30 capsule, Rfl: 2 .  Cholecalciferol (VITAMIN D) 50 MCG (2000 UT) tablet, Take 2,000 Units by mouth daily. , Disp: , Rfl:  .  clonazePAM (KLONOPIN) 1 MG tablet, Take 1 tablet (1 mg total) by mouth 2 (two) times daily as needed for anxiety., Disp: 60 tablet, Rfl: 1 .  Cyanocobalamin (VITAMIN B 12) 500 MCG TABS, Take 500 mcg by mouth daily. , Disp: , Rfl:  .  DULoxetine (CYMBALTA) 30 MG capsule, Take 3 capsules (90 mg total) by mouth daily after supper., Disp: 270 capsule, Rfl: 1 .  MELATONIN PO, Take 10 mg by mouth at bedtime., Disp: , Rfl:  .  milk thistle 175 MG tablet, Take 175 mg by mouth daily. , Disp: , Rfl:  .  Multiple Vitamin (MULTI-VITAMIN) tablet, Take 1 tablet by mouth daily. , Disp: , Rfl:  .  Omega-3 Fatty Acids (FISH OIL) 1200 MG CAPS, Take 1,200 mg by mouth daily. , Disp: , Rfl:  .  Oxcarbazepine (TRILEPTAL) 300 MG tablet, Take 1 tablet (300 mg total) by mouth 2 (two) times daily., Disp: 180 tablet, Rfl: 0 .  PROAIR HFA 108 (90 Base) MCG/ACT inhaler, SMARTSIG:1-2 Puff(s) Via Inhaler Every 6 Hours PRN, Disp: , Rfl:  .  Zinc 22.5 MG TABS, Take by mouth., Disp: , Rfl:  .  albuterol  (VENTOLIN HFA) 108 (90 Base) MCG/ACT inhaler, Inhale into the lungs. (Patient not taking: Reported on 08/02/2020), Disp: , Rfl:  .  amphetamine-dextroamphetamine (ADDERALL XR) 15 MG 24 hr capsule, Take 1 capsule by mouth every morning., Disp: 30 capsule, Rfl: 0 .  Barberry-Oreg Grape-Goldenseal (BERBERINE COMPLEX PO), Take 1,200 mg by mouth in the morning, at noon, and at bedtime. (Patient not taking: Reported on 08/02/2020), Disp: , Rfl:  .  benzonatate (TESSALON) 100 MG capsule, Take 100 mg by mouth 3 (three) times daily. (Patient not taking: Reported on 08/02/2020), Disp: , Rfl:  .  ELDERBERRY PO, Take 1 tablet by mouth as needed (cold symptoms).  (Patient not taking: Reported on 08/02/2020), Disp: , Rfl:  .  HYDROcodone-acetaminophen (NORCO/VICODIN) 5-325 MG  tablet, Take 1 tablet by mouth every 6 (six) hours as needed. (Patient not taking: Reported on 08/02/2020), Disp: , Rfl:  .  ibuprofen (ADVIL) 800 MG tablet, Take 1 tablet (800 mg total) by mouth 3 (three) times daily. (Patient not taking: No sig reported), Disp: 21 tablet, Rfl: 0 .  methylphenidate (RITALIN LA) 40 MG 24 hr capsule, Take by mouth. (Patient not taking: Reported on 08/02/2020), Disp: , Rfl:  .  predniSONE (DELTASONE) 20 MG tablet, Take 20 mg by mouth 2 (two) times daily. (Patient not taking: Reported on 08/02/2020), Disp: , Rfl:  .  pyridoxine (B-6) 100 MG tablet, Take by mouth. (Patient not taking: Reported on 08/02/2020), Disp: , Rfl:   Allergies  Allergen Reactions  . Vyvanse [Lisdexamfetamine] Other (See Comments)    Bounce off of the wall    I personally reviewed active problem list, medication list, allergies, family history, social history, health maintenance, notes from last encounter with the patient/caregiver today.   ROS  Ten systems reviewed and is negative except as mentioned in HPI  Objective  Vitals:   08/02/20 0950  BP: 112/74  Pulse: 99  Resp: 16  Temp: 98 F (36.7 C)  TempSrc: Oral  SpO2: 98%   Weight: 219 lb 9.6 oz (99.6 kg)  Height: 4\' 11"  (1.499 m)    Body mass index is 44.35 kg/m.  Physical Exam  Constitutional: Patient appears well-developed and well-nourished. Obese No distress.  HEENT: head atraumatic, normocephalic, pupils equal and reactive to light,  neck supple Cardiovascular: Normal rate, regular rhythm and normal heart sounds.  No murmur heard. No BLE edema. Pulmonary/Chest: Effort normal and breath sounds normal. No respiratory distress. Abdominal: Soft.  There is no tenderness. Skin: erythematous papules , scaly, non tender  Psychiatric: Patient has a normal mood and affect. behavior is normal. Judgment and thought content normal.  PHQ2/9: Depression screen [facility] 2/9 08/02/2020 08/02/2020 03/13/2020 03/13/2020 09/14/2019  Decreased Interest 1 0 0 0 1  Down, Depressed, Hopeless 1 0 0 0 3  PHQ - 2 Score 2 0 0 0 4  Altered sleeping 2 - 0 0 0  Tired, decreased energy 2 - 1 0 3  Change in appetite 1 - 0 0 0  Feeling bad or failure about yourself  0 - 0 0 3  Trouble concentrating 2 - 1 0 3  Moving slowly or fidgety/restless 2 - 1 0 3  Suicidal thoughts 1 - 0 0 0  PHQ-9 Score 12 - 3 0 16  Difficult doing work/chores Somewhat difficult - Not difficult at all - Extremely dIfficult  Some recent data might be hidden    phq 9 is positive seeing psychiatrist    Fall Risk: Fall Risk  08/02/2020 03/13/2020 03/13/2020 10/05/2019 09/14/2019  Falls in the past year? 0 0 0 0 0  Number falls in past yr: 0 0 0 - 0  Injury with Fall? 0 1 0 - 0  Follow up - - - - -     Functional Status Survey: Is the patient deaf or have difficulty hearing?: No Does the patient have difficulty seeing, even when wearing glasses/contacts?: Yes Does the patient have difficulty concentrating, remembering, or making decisions?: Yes Does the patient have difficulty walking or climbing stairs?: No Does the patient have difficulty dressing or bathing?: No Does the patient have difficulty doing  errands alone such as visiting a doctor's office or shopping?: No   Assessment & Plan  1. Morbid obesity with BMI of 40.0-44.9,  adult (HCC)  - Liraglutide -Weight Management (SAXENDA) 18 MG/3ML SOPN; Inject 0.3-3 mg into the skin daily.  Dispense: 9 mL; Refill: 2 - Insulin Pen Needle (NOVOFINE) 30G X 8 MM MISC; Inject 10 each into the skin as needed.  Dispense: 100 each; Refill: 1  Referral surgeon   2. Rash  - Ambulatory referral to Dermatology - RPR - Sedimentation rate - C-reactive protein - ANA,IFA RA Diag Pnl w/rflx Tit/Patn  3. Encounter for screening for HIV  - HIV antibody (with reflex)  4. Routine screening for STI (sexually transmitted infection)  - Cervicovaginal ancillary only

## 2020-08-02 ENCOUNTER — Encounter: Payer: Self-pay | Admitting: Family Medicine

## 2020-08-02 ENCOUNTER — Other Ambulatory Visit: Payer: Self-pay

## 2020-08-02 ENCOUNTER — Other Ambulatory Visit (HOSPITAL_COMMUNITY)
Admission: RE | Admit: 2020-08-02 | Discharge: 2020-08-02 | Disposition: A | Discharge status: Home / Self Care | Payer: Medicaid Other | Source: Ambulatory Visit | Attending: Family Medicine | Admitting: Family Medicine

## 2020-08-02 ENCOUNTER — Ambulatory Visit (INDEPENDENT_AMBULATORY_CARE_PROVIDER_SITE_OTHER): Payer: Medicaid Other | Admitting: Licensed Clinical Social Worker

## 2020-08-02 ENCOUNTER — Ambulatory Visit: Payer: Medicaid Other | Admitting: Family Medicine

## 2020-08-02 VITALS — BP 112/74 | HR 99 | Temp 98.0°F | Resp 16 | Ht 59.0 in | Wt 219.6 lb

## 2020-08-02 DIAGNOSIS — Z113 Encounter for screening for infections with a predominantly sexual mode of transmission: Secondary | ICD-10-CM

## 2020-08-02 DIAGNOSIS — Z6841 Body Mass Index (BMI) 40.0 and over, adult: Secondary | ICD-10-CM

## 2020-08-02 DIAGNOSIS — F3161 Bipolar disorder, current episode mixed, mild: Secondary | ICD-10-CM | POA: Diagnosis not present

## 2020-08-02 DIAGNOSIS — R21 Rash and other nonspecific skin eruption: Secondary | ICD-10-CM | POA: Diagnosis not present

## 2020-08-02 DIAGNOSIS — F902 Attention-deficit hyperactivity disorder, combined type: Secondary | ICD-10-CM

## 2020-08-02 DIAGNOSIS — F603 Borderline personality disorder: Secondary | ICD-10-CM | POA: Diagnosis not present

## 2020-08-02 DIAGNOSIS — Z114 Encounter for screening for human immunodeficiency virus [HIV]: Secondary | ICD-10-CM

## 2020-08-02 DIAGNOSIS — F4310 Post-traumatic stress disorder, unspecified: Secondary | ICD-10-CM

## 2020-08-02 MED ORDER — SAXENDA 18 MG/3ML ~~LOC~~ SOPN: 0.3000 mg | PEN_INJECTOR | Freq: Every day | SUBCUTANEOUS | 2 refills | Status: DC

## 2020-08-02 MED ORDER — NOVOFINE 30G X 8 MM MISC: 1.0000 | 1 refills | Status: DC | PRN

## 2020-08-02 NOTE — Progress Notes (Signed)
Photo of rash

## 2020-08-02 NOTE — Progress Notes (Signed)
Virtual Visit via Video Note   I connected with Courtney [name] on 08/02/20 at 2:00pm by video enabled telemedicine application and verified that I am speaking with the correct person using two identifiers.   I discussed the limitations, risks, security and privacy concerns of performing an evaluation and management service by video and the availability of in person appointments. I also discussed with the patient that there may be a patient responsible charge related to this service. The patient expressed understanding and agreed to proceed.   I discussed the assessment and treatment plan with the patient. The patient was provided an opportunity to ask questions and all were answered. The patient agreed with the plan and demonstrated an understanding of the instructions.   The patient was advised to call back or seek an in-person evaluation if the symptoms worsen or if the condition fails to improve as anticipated.   I provided 30 minutes of non-face-to-face time during this encounter.    , LCSW, LCAS ________________________ THERAPIST PROGRESS NOTE   Session Time: 2:00pm - 2:30pm   Location: Patient: Patient Home Provider: [facility]    Participation Level: Active   Behavioral Response: Alert, neatly groomed, casually dressed, euthymic affect/mood   Type of Therapy:  Individual Therapy   Treatment Goals addressed: Depression and Anxiety management; Medication management; Increasing independence   Interventions: CBT, solution focused    Summary: Courtney [name] is a 29 year old female that presented for video appointment and is diagnosed with Bipolar I Disorder, mixed, mild; Borderline personality disorder; PTSD; and ADHD, combined type.        Suicidal/Homicidal: None; without intent or plan   Therapist Response: Clinician met with Courtney [name] for virtual therapy session today and assessed for safety, sobriety, and medication compliance.  Courtney [name] presented for  appointment on time and was alert, oriented x5, with no evidence or self-report of SI/HI or A/V H.  Courtney [name] reported that she continues taking medication as prescribed and has been limiting marijuana use to 1-2x per week.  Clinician inquired about Courtney [name]'s emotional ratings today, as well as any significant changes in thoughts, feelings, or behavior since previous check-in.  Courtney [name] reported scores of 0/10 for depression, and anxiety, as well as 1/10 for anger/irritability and mania, stating "This week has been pretty good, I think.  I haven't had any panic attacks or break downs".  Clinician inquired about Courtney [name]'s successes and struggles over last week.  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  She reported that she would also be travelling to stay with family today for the holidays and was looking forward to reconnecting with them.  Courtney [name] reported that one struggle has been practicing patience, as she is growing dissatisfied with certain aspects of her current living situation, and strongly considering moving to [place] where a friend lives who offered her a room.  Clinician encouraged Courtney [name] to avoid making any impulsive decisions which could jeopardize her mental and financial stability, and assisted her in weighing pros and cons of this potential transition in order to help her determine best choice.  Courtney [name] was receptive to this approach, and noted that it could offer her more room in a home, put her close to a long term friend for support, offer employment opportunities, more freedom, and a fresh start, but acknowledged that this could also put her at risk for homelessness if the person kicked her   out, would require her to change medical providers including therapist, cut her off from existing support network, and put her safety at risk if she ends up living in a bad  part of town.  Interventions were effective, as evidenced by Courtney [name] reporting that this helped her look more carefully at this decision and decide to take more time to consider options, speak with the friend about these noted issues, and potentially take a trip to [place] first to scope out the area before making any commitments.  Clinician will continue to monitor.                       Plan: Meet again in 1 week virtually.   Diagnosis: Bipolar I Disorder, mixed, mild; Borderline personality disorder; PTSD; ADHD, combined type.   Courtney [name], [place], LCAS 08/02/20

## 2020-08-02 NOTE — Patient Instructions (Signed)
Start at 0.3 and go up by 0.3 every week to a max of 3 mg per day, do it in the mornings   Liraglutide injection (Weight Management) What is this medicine? LIRAGLUTIDE (LIR a GLOO tide) is used to help people lose weight and maintain weight loss. It is used with a reduced-calorie diet and exercise. This medicine may be used for other purposes; ask your health care provider or pharmacist if you have questions. COMMON BRAND NAME(S): Saxenda What should I tell my health care provider before I take this medicine? They need to know if you have any of these conditions:  endocrine tumors (MEN 2) or if someone in your family had these tumors  gallbladder disease  high cholesterol  history of alcohol abuse problem  history of pancreatitis  kidney disease or if you are on dialysis  liver disease  previous swelling of the tongue, face, or lips with difficulty breathing, difficulty swallowing, hoarseness, or tightening of the throat  stomach problems  suicidal thoughts, plans, or attempt; a previous suicide attempt by you or a family member  thyroid cancer or if someone in your family had thyroid cancer  an unusual or allergic reaction to liraglutide, other medicines, foods, dyes, or preservatives  pregnant or trying to get pregnant  breast-feeding How should I use this medicine? This medicine is for injection under the skin of your upper leg, stomach area, or upper arm. You will be taught how to prepare and give this medicine. Use exactly as directed. Take your medicine at regular intervals. Do not take it more often than directed. This drug comes with INSTRUCTIONS FOR USE. Ask your pharmacist for directions on how to use this drug. Read the information carefully. Talk to your pharmacist or health care provider if you have questions. It is important that you put your used needles and syringes in a special sharps container. Do not put them in a trash can. If you do not have a sharps  container, call your pharmacist or healthcare provider to get one. A special MedGuide will be given to you by the pharmacist with each prescription and refill. Be sure to read this information carefully each time. Talk to your pediatrician regarding the use of this medicine in children. Special care may be needed. Overdosage: If you think you have taken too much of this medicine contact a poison control center or emergency room at once. NOTE: This medicine is only for you. Do not share this medicine with others. What if I miss a dose? If you miss a dose, take it as soon as you can. If it is almost time for your next dose, take only that dose. Do not take double or extra doses. If you miss your dose for 3 days or more, call your doctor or health care professional to talk about how to restart this medicine. What may interact with this medicine?  insulin and other medicines for diabetes This list may not describe all possible interactions. Give your health care provider a list of all the medicines, herbs, non-prescription drugs, or dietary supplements you use. Also tell them if you smoke, drink alcohol, or use illegal drugs. Some items may interact with your medicine. What should I watch for while using this medicine? Visit your doctor or health care professional for regular checks on your progress. Drink plenty of fluids while taking this medicine. Check with your doctor or health care professional if you get an attack of severe diarrhea, nausea, and vomiting. The loss  of too much body fluid can make it dangerous for you to take this medicine. This medicine may affect blood sugar levels. Ask your healthcare provider if changes in diet or medicines are needed if you have diabetes. Patients and their families should watch out for worsening depression or thoughts of suicide. Also watch out for sudden changes in feelings such as feeling anxious, agitated, panicky, irritable, hostile, aggressive, impulsive,  severely restless, overly excited and hyperactive, or not being able to sleep. If this happens, especially at the beginning of treatment or after a change in dose, call your health care professional. Women should inform their health care provider if they wish to become pregnant or think they might be pregnant. Losing weight while pregnant is not advised and may cause harm to the unborn child. Talk to your health care provider for more information. What side effects may I notice from receiving this medicine? Side effects that you should report to your doctor or health care professional as soon as possible:  allergic reactions like skin rash, itching or hives, swelling of the face, lips, or tongue  breathing problems  diarrhea that continues or is severe  lump or swelling on the neck  severe nausea  signs and symptoms of infection like fever or chills; cough; sore throat; pain or trouble passing urine  signs and symptoms of low blood sugar such as feeling anxious; confusion; dizziness; increased hunger; unusually weak or tired; increased sweating; shakiness; cold, clammy skin; irritable; headache; blurred vision; fast heartbeat; loss of consciousness  signs and symptoms of kidney injury like trouble passing urine or change in the amount of urine  trouble swallowing  unusual stomach upset or pain  vomiting Side effects that usually do not require medical attention (report to your doctor or health care professional if they continue or are bothersome):  constipation  decreased appetite  diarrhea  fatigue  headache  nausea  pain, redness, or irritation at site where injected  stomach upset  stuffy or runny nose This list may not describe all possible side effects. Call your doctor for medical advice about side effects. You may report side effects to FDA at 1-800-FDA-1088. Where should I keep my medicine? Keep out of the reach of children. Store unopened pen in a refrigerator  between 2 and 8 degrees C (36 and 46 degrees F). Do not freeze or use if the medicine has been frozen. Protect from light and excessive heat. After you first use the pen, it can be stored at room temperature between 15 and 30 degrees C (59 and 86 degrees F) or in a refrigerator. Throw away your used pen after 30 days or after the expiration date, whichever comes first. Do not store your pen with the needle attached. If the needle is left on, medicine may leak from the pen. NOTE: This sheet is a summary. It may not cover all possible information. If you have questions about this medicine, talk to your doctor, pharmacist, or health care provider.  2020 Elsevier/Gold Standard (2019-06-02 21:16:59)

## 2020-08-06 ENCOUNTER — Encounter: Payer: Self-pay | Admitting: Family Medicine

## 2020-08-06 LAB — RPR: RPR Ser Ql: NONREACTIVE

## 2020-08-06 LAB — SEDIMENTATION RATE: Sed Rate: 19 mm/h (ref 0–20)

## 2020-08-06 LAB — HIV ANTIBODY (ROUTINE TESTING W REFLEX): HIV 1&2 Ab, 4th Generation: NONREACTIVE

## 2020-08-06 LAB — ANA,IFA RA DIAG PNL W/RFLX TIT/PATN
Anti Nuclear Antibody (ANA): NEGATIVE
Cyclic Citrullin Peptide Ab: <16 UNITS
Rheumatoid fact SerPl-aCnc: <14 IU/mL (ref <14)

## 2020-08-06 LAB — CERVICOVAGINAL ANCILLARY ONLY
Chlamydia: NEGATIVE
Comment: NEGATIVE
Comment: NORMAL
Neisseria Gonorrhea: NEGATIVE

## 2020-08-06 LAB — C-REACTIVE PROTEIN: CRP: 11.3 mg/L — ABNORMAL HIGH (ref <8.0)

## 2020-08-09 ENCOUNTER — Ambulatory Visit (INDEPENDENT_AMBULATORY_CARE_PROVIDER_SITE_OTHER): Payer: Medicaid Other | Admitting: Licensed Clinical Social Worker

## 2020-08-09 ENCOUNTER — Other Ambulatory Visit: Payer: Self-pay

## 2020-08-09 DIAGNOSIS — F4310 Post-traumatic stress disorder, unspecified: Secondary | ICD-10-CM | POA: Diagnosis not present

## 2020-08-09 DIAGNOSIS — F3161 Bipolar disorder, current episode mixed, mild: Secondary | ICD-10-CM | POA: Diagnosis not present

## 2020-08-09 DIAGNOSIS — F902 Attention-deficit hyperactivity disorder, combined type: Secondary | ICD-10-CM | POA: Diagnosis not present

## 2020-08-09 DIAGNOSIS — F603 Borderline personality disorder: Secondary | ICD-10-CM | POA: Diagnosis not present
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
## 2020-08-15 ENCOUNTER — Other Ambulatory Visit: Payer: Self-pay

## 2020-08-15 ENCOUNTER — Telehealth (INDEPENDENT_AMBULATORY_CARE_PROVIDER_SITE_OTHER): Payer: Medicaid Other | Admitting: Psychiatry

## 2020-08-15 DIAGNOSIS — F319 Bipolar disorder, unspecified: Secondary | ICD-10-CM

## 2020-08-15 DIAGNOSIS — F422 Mixed obsessional thoughts and acts: Secondary | ICD-10-CM

## 2020-08-15 DIAGNOSIS — F603 Borderline personality disorder: Secondary | ICD-10-CM | POA: Diagnosis not present

## 2020-08-15 DIAGNOSIS — F902 Attention-deficit hyperactivity disorder, combined type: Secondary | ICD-10-CM | POA: Diagnosis not present

## 2020-08-15 MED ORDER — AMPHETAMINE-DEXTROAMPHET ER 15 MG PO CP24: 15.0000 mg | ORAL_CAPSULE | ORAL | 0 refills | Status: DC

## 2020-08-15 MED ORDER — CARIPRAZINE HCL 3 MG PO CAPS: 3.0000 mg | ORAL_CAPSULE | Freq: Every day | ORAL | 2 refills | Status: DC

## 2020-08-15 MED ORDER — OXCARBAZEPINE 300 MG PO TABS
300.0000 mg | ORAL_TABLET | Freq: Two times a day (BID) | ORAL | 0 refills | Status: DC
Start: 2020-09-10 — End: 2020-10-17
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
## 2020-08-16 ENCOUNTER — Ambulatory Visit (HOSPITAL_COMMUNITY): Payer: Medicaid Other | Admitting: Licensed Clinical Social Worker

## 2020-08-17 ENCOUNTER — Encounter: Payer: Self-pay | Admitting: Family Medicine

## 2020-08-23 ENCOUNTER — Other Ambulatory Visit: Payer: Self-pay

## 2020-08-23 ENCOUNTER — Ambulatory Visit (INDEPENDENT_AMBULATORY_CARE_PROVIDER_SITE_OTHER): Payer: Medicaid Other | Admitting: Licensed Clinical Social Worker

## 2020-08-23 DIAGNOSIS — F603 Borderline personality disorder: Secondary | ICD-10-CM

## 2020-08-23 DIAGNOSIS — F902 Attention-deficit hyperactivity disorder, combined type: Secondary | ICD-10-CM

## 2020-08-23 DIAGNOSIS — F3161 Bipolar disorder, current episode mixed, mild: Secondary | ICD-10-CM

## 2020-08-23 DIAGNOSIS — F4310 Post-traumatic stress disorder, unspecified: Secondary | ICD-10-CM

## 2020-08-23 NOTE — Progress Notes (Signed)
Virtual Visit via Video Note   I connected with Courtney [name] on 08/23/20 at 11:00am by video enabled telemedicine application and verified that I am speaking with the correct person using two identifiers.   I discussed the limitations, risks, security and privacy concerns of performing an evaluation and management service by video and the availability of in person appointments. I also discussed with the patient that there may be a patient responsible charge related to this service. The patient expressed understanding and agreed to proceed.   I discussed the assessment and treatment plan with the patient. The patient was provided an opportunity to ask questions and all were answered. The patient agreed with the plan and demonstrated an understanding of the instructions.   The patient was advised to call back or seek an in-person evaluation if the symptoms worsen or if the condition fails to improve as anticipated.   I provided 45 minutes of non-face-to-face time during this encounter.   [name], LCSW, LCAS ________________________ THERAPIST PROGRESS NOTE   Session Time: 11:00am - 11:45am  Location: Patient: Patient Home Provider: [facility] [place] [facility]    Participation Level: Active   Behavioral Response: Alert, neatly groomed, casually dressed, anxious affect/mood   Type of Therapy:  Individual Therapy   Treatment Goals addressed: Depression and Anxiety management; Medication management; Becoming more independent    Interventions: CBT, treatment planning, problem solving     Summary: Courtney [name] is a 30 year old female that presented for video appointment and is diagnosed with Bipolar I Disorder, mixed, mild; Borderline personality disorder; PTSD; and ADHD, combined type.        Suicidal/Homicidal: None; without intent or plan   Therapist Response: Clinician met with [name] for virtual therapy session and assessed for safety, sobriety, and medication compliance.  Courtney [name]  presented on time and was alert, oriented x5, with no evidence or self-report of active SI/HI or A/V H.  Courtney [name] reported that she continues taking medication as prescribed and is smoking THC x1 every 3-4 days, stating "I've cut back a lot".  Clinician inquired about Courtney [name]'s emotional ratings today, as well as any significant changes in thoughts, feelings, or behavior since previous check-in.  [name] reported scores of 2/10 for depression, 4/10 for anxiety, and 6/10 for mania, stating "I did have two breakdowns the other day".  Clinician inquired about Courtney [name]'s present stressors influencing mood, and how she has attempted to cope.  [name] reported that she has been seriously considering moving to [place] in 1-2 months as she continues talking with friends that live there about her options for work and housing, and feels that this would be an excellent opportunity for greater stability, although she acknowledges that she has been more manic, and anxious about 'What ifs'.  Clinician assisted Courtney [name] in weighing the pros and cons of this upcoming transition in order to provide greater confidence in this decision, as well as identifying concrete steps that would need to be taken in following weeks in order to ensure continuation of insurance coverage, linkage to new providers, arranging logistics of moving belongings, etc.  Intervention was effective, as evidenced by [name] reporting that discussing this transition with clinician made her more confident in her decision and eased some of her anxieties, so now she has a concrete plan ahead of following weeks to make proper arrangements.  Courtney [name] stated "I'm going to do some more research over the weekend so I'm not as confused.  There is a lot to do, but I'll let you  know what I decide to do, or if anything changes".  Clinician recommended updating treatment plan at this time, and she was agreeable to this, collaborating to make following  revisions:  Meet with clinician virtually once per week for therapy to address ongoing progress towards goals and needs; Meet with psychiatrist once per month to address efficacy of medication and make adjustments as needed to regimen and/or dosage; Take medication daily as prescribed to reduce symptoms and improve overall daily functioning; Reduce depression from average severity of 4/10 down to 2/10 in the next 90 days by engaging in positive self-care activities for 2 hours each day, such as listening to music, watching movies, playing video games, doing makeup, cutting hair, and/or reading; Reduce anxiety from average severity of 4/10 down to 2/10, as well as maintain panic attacks at 0 per week via utilization of 3-4 relaxation techniques daily such as mindful breathing, meditation, progressive muscle relaxation, grounding techniques, and/or positive visualizations; Continue working to become more independent in daily activities in order to increase sense of self-reliance, confidence, and curb dependency on other people; Identify 2-3 issues with communication skills that can be addressed to improve socialization with supports and reduce borderline tendencies within next 90 days;  Write 1 page at a minimum in journal each day regarding thoughts, feelings, and behaviors that arise, with goal of better understanding mood changes for implementation of appropriate copings skills ahead of time ("I want to observe, but not absorb other people's feelings like empaths do"); Exercise 2-3 times per week for 30 minutes at a minimum to improve both physical and mental well-being; Continue working to establish zero balanced budget within next 60 days in an effort to save money each month, and curb impulsive spending behavior; Monitor marijuana/Delta 8 use closely to avoid developing dependence and negatively impacting mental health, motivation, and/or other goal progress; Work closely with support network to develop concrete  steps towards relocating to [place] in next 1-2 months, including transferring providers/insurance, [place], housing, employment, etc in order to curb related anxiety and ensure ongoing stability; Voluntarily seek hospitalization with assistance from support system and/or medical professionals should SI/HI appear and safety of self or others is determined at risk due to development of intent, plan, and access to means necessary to carry out plan.  Clinician will continue to monitor.                       Plan: Meet again in 1 week virtually.   Diagnosis: Bipolar I Disorder, mixed, mild; Borderline personality disorder; PTSD; ADHD, combined type.   [name], [place], LCAS 08/23/20

## 2020-08-30 ENCOUNTER — Other Ambulatory Visit: Payer: Self-pay

## 2020-08-30 ENCOUNTER — Ambulatory Visit (HOSPITAL_COMMUNITY): Payer: Medicaid Other | Admitting: Licensed Clinical Social Worker

## 2020-09-04 ENCOUNTER — Telehealth (HOSPITAL_COMMUNITY): Payer: Self-pay

## 2020-09-04 NOTE — Telephone Encounter (Signed)
Transition Care Management Follow-up Telephone Call  Date of discharge and from where: 09/03/2019 from [facility]  How have you been since you were released from the hospital? Pt is feeling pretty good and is quarantining.   Any questions or concerns? No  Items Reviewed:  Did the pt receive and understand the discharge instructions provided? Yes   Medications obtained and verified? Yes   Other? No   Any new allergies since your discharge? No   Dietary orders reviewed? Yes  Do you have support at home? Yes   Functional Questionnaire: (I = Independent and D = Dependent) ADLs: I  Bathing/Dressing- I  Meal Prep- I  Eating- I  Maintaining continence- I  Transferring/Ambulation- I  Managing Meds- I  Follow up appointments reviewed:   PCP Hospital f/u appt confirmed? Yes  Scheduled to see Dr. Carlynn Purl on 09/14/2020 @ 2:00pm.  Are transportation arrangements needed? No  If their condition worsens, is the pt aware to call PCP or go to the Emergency Dept.? Yes Was the patient provided with contact information for the PCP's office or ED? Yes Was to pt encouraged to call back with questions or concerns? Yes

## 2020-09-06 ENCOUNTER — Other Ambulatory Visit: Payer: Self-pay

## 2020-09-06 ENCOUNTER — Ambulatory Visit (INDEPENDENT_AMBULATORY_CARE_PROVIDER_SITE_OTHER): Payer: Medicaid Other | Admitting: Licensed Clinical Social Worker

## 2020-09-06 DIAGNOSIS — F902 Attention-deficit hyperactivity disorder, combined type: Secondary | ICD-10-CM

## 2020-09-06 DIAGNOSIS — F3161 Bipolar disorder, current episode mixed, mild: Secondary | ICD-10-CM | POA: Diagnosis not present

## 2020-09-06 DIAGNOSIS — F603 Borderline personality disorder: Secondary | ICD-10-CM

## 2020-09-06 DIAGNOSIS — F4310 Post-traumatic stress disorder, unspecified: Secondary | ICD-10-CM

## 2020-09-06 NOTE — Progress Notes (Signed)
Virtual Visit via Video Note   I connected with Courtney [name] on 09/06/20 at 11:30am by video enabled telemedicine application and verified that I am speaking with the correct person using two identifiers.   I discussed the limitations, risks, security and privacy concerns of performing an evaluation and management service by video and the availability of in person appointments. I also discussed with the patient that there may be a patient responsible charge related to this service. The patient expressed understanding and agreed to proceed.   I discussed the assessment and treatment plan with the patient. The patient was provided an opportunity to ask questions and all were answered. The patient agreed with the plan and demonstrated an understanding of the instructions.   The patient was advised to call back or seek an in-person evaluation if the symptoms worsen or if the condition fails to improve as anticipated.   I provided 30 minutes of non-face-to-face time during this encounter.   [name], LCSW, LCAS ________________________ THERAPIST PROGRESS NOTE   Session Time: 11:30am - 12:00pm  Location: Patient: Patient Home Provider: [facility] [place] [facility]    Participation Level: Active   Behavioral Response: Alert, neatly groomed, casually dressed, anxious affect/mood   Type of Therapy:  Individual Therapy   Treatment Goals addressed: Depression and Anxiety management; Medication management; Exercise; Relocating to [place]; Becoming more independent    Interventions: CBT, problem solving     Summary: Courtney [name] is a 30 year old female that presented for video appointment and is diagnosed with Bipolar I Disorder, mixed, mild; Borderline personality disorder; PTSD; and ADHD, combined type.        Suicidal/Homicidal: None; without intent or plan   Therapist Response: Clinician met with Courtney [name] for virtual therapy session and assessed for safety, sobriety, and medication  compliance.  Courtney [name] asked to postpone session by 30 minutes due to conflicting appointments, so meeting was held at 11:30am instead.  Courtney [name] presented as alert, oriented x5, with no evidence or self-report of active SI/HI or A/V H.  Courtney [name] reported ongoing compliance with medication and disclosed that she is smoking THC x1-2 per week.  Clinician inquired about Courtney [name]'s current emotional ratings, as well as any significant changes in thoughts, feelings, or behavior since last check-in.  Courtney [name] reported scores of 0/10 for depression, 1/10 for anxiety, and 0/10 for mania, stating "I'm feeling really happy today".  Courtney [name] reported that she went to the gym today, and will be spending time with a friend later.  She reported that she also made the decision not to move to [place] after all since speaking with her mother about this plan, noting that her mother pointed out additional risks she hadn't considered. Courtney [name] stated "I think this happened for a reason.  I like it here in [place], my family is here, and my friends are too".  Courtney [name] reported that she has since been offered a different living opportunity with some friends nearby, but is also hesitant about this transition as she narrowly avoided homelessness a few months back.  Clinician assisted Courtney [name] in weighing the pros and cons of remaining in current living situation versus transitioning to live with someone else in order to improve decision making skills and curb impulsivity.  Clinician encouraged Courtney [name] to consider how each environment and residents living there would impact her overall mental health.  Intervention was effective, as evidenced by Courtney [name] reporting that discussing her options for living situations helped reduce some related anxiety, and reinforce more logical thinking, stating "  I know I can be very impulsive, and need to think through these things fully to stay out of trouble".  Clinician will continue to monitor.                        Plan: Meet again in 1 week virtually.   Diagnosis: Bipolar I Disorder, mixed, mild; Borderline personality disorder; PTSD; ADHD, combined type.   [name], [place], LCAS 09/06/20

## 2020-09-14 ENCOUNTER — Ambulatory Visit: Payer: Medicaid Other | Admitting: Family Medicine
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
## 2020-09-20 ENCOUNTER — Ambulatory Visit (HOSPITAL_COMMUNITY): Payer: Medicaid Other | Admitting: Licensed Clinical Social Worker

## 2020-09-21 ENCOUNTER — Other Ambulatory Visit: Payer: Self-pay

## 2020-09-21 ENCOUNTER — Ambulatory Visit (INDEPENDENT_AMBULATORY_CARE_PROVIDER_SITE_OTHER): Payer: Medicaid Other | Admitting: Licensed Clinical Social Worker

## 2020-09-21 DIAGNOSIS — F603 Borderline personality disorder: Secondary | ICD-10-CM

## 2020-09-21 DIAGNOSIS — F3161 Bipolar disorder, current episode mixed, mild: Secondary | ICD-10-CM

## 2020-09-21 DIAGNOSIS — F4310 Post-traumatic stress disorder, unspecified: Secondary | ICD-10-CM | POA: Diagnosis not present

## 2020-09-21 DIAGNOSIS — F902 Attention-deficit hyperactivity disorder, combined type: Secondary | ICD-10-CM | POA: Diagnosis not present
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
## 2020-09-27 ENCOUNTER — Ambulatory Visit (INDEPENDENT_AMBULATORY_CARE_PROVIDER_SITE_OTHER): Payer: Medicaid Other | Admitting: Licensed Clinical Social Worker

## 2020-09-27 ENCOUNTER — Other Ambulatory Visit: Payer: Self-pay

## 2020-09-27 DIAGNOSIS — F3162 Bipolar disorder, current episode mixed, moderate: Secondary | ICD-10-CM | POA: Diagnosis not present

## 2020-09-27 DIAGNOSIS — F603 Borderline personality disorder: Secondary | ICD-10-CM

## 2020-09-27 DIAGNOSIS — F4312 Post-traumatic stress disorder, chronic: Secondary | ICD-10-CM | POA: Diagnosis not present

## 2020-09-27 DIAGNOSIS — F1220 Cannabis dependence, uncomplicated: Secondary | ICD-10-CM | POA: Diagnosis not present

## 2020-09-27 DIAGNOSIS — F909 Attention-deficit hyperactivity disorder, unspecified type: Secondary | ICD-10-CM
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
## 2020-10-04 ENCOUNTER — Ambulatory Visit (INDEPENDENT_AMBULATORY_CARE_PROVIDER_SITE_OTHER): Payer: Medicaid Other | Admitting: Licensed Clinical Social Worker

## 2020-10-04 ENCOUNTER — Other Ambulatory Visit: Payer: Self-pay

## 2020-10-04 DIAGNOSIS — F1220 Cannabis dependence, uncomplicated: Secondary | ICD-10-CM

## 2020-10-04 DIAGNOSIS — F4312 Post-traumatic stress disorder, chronic: Secondary | ICD-10-CM | POA: Diagnosis not present

## 2020-10-04 DIAGNOSIS — F603 Borderline personality disorder: Secondary | ICD-10-CM

## 2020-10-04 DIAGNOSIS — F3162 Bipolar disorder, current episode mixed, moderate: Secondary | ICD-10-CM

## 2020-10-04 DIAGNOSIS — F909 Attention-deficit hyperactivity disorder, unspecified type: Secondary | ICD-10-CM

## 2020-10-04 NOTE — Progress Notes (Signed)
Virtual Visit via Video Note   I connected with Courtney [name] on 10/04/20 at 11:00am by video enabled telemedicine application and verified that I am speaking with the correct person using two identifiers.   I discussed the limitations, risks, security and privacy concerns of performing an evaluation and management service by video and the availability of in person appointments. I also discussed with the patient that there may be a patient responsible charge related to this service. The patient expressed understanding and agreed to proceed.   I discussed the assessment and treatment plan with the patient. The patient was provided an opportunity to ask questions and all were answered. The patient agreed with the plan and demonstrated an understanding of the instructions.   The patient was advised to call back or seek an in-person evaluation if the symptoms worsen or if the condition fails to improve as anticipated.   I provided 30 minutes of non-face-to-face time during this encounter.   [name], LCSW, LCAS ________________________ THERAPIST PROGRESS NOTE   Session Time: 11:00am - 11:30am  Location: Patient: Patient Home Provider: [facility] [place] [facility]    Participation Level: Active   Behavioral Response: Alert, neatly groomed, casually dressed, manic affect/mood   Type of Therapy:  Individual Therapy   Treatment Goals addressed: Depression and Anxiety management; Medication management; Increasing independence   Interventions: CBT, cost/benefit analysis    Summary: [name] is a 30 year old female that presented for video appointment and is diagnosed with Bipolar I Disorder, mixed, moderate; Borderline personality disorder; PTSD; Cannabis Use Disorder, moderate; and ADHD, combined type.        Suicidal/Homicidal: None; without intent or plan   Therapist Response: Clinician met with [name] for virtual therapy session and assessed for safety, sobriety, and medication  compliance.  Betta presented as alert, oriented x5, with no evidence or self-report of active SI/HI or A/V H.  Aldona reported ongoing compliance with medication and continues smoking THC daily.  Clinician inquired about [name]'s current emotional ratings, as well as any significant changes in thoughts, feelings, or behavior since annual assessment.  [name] reported scores of 0/10 for depression, 2/10 for anxiety, and 3/10 for mania, stating "I have big news-I've decided to move to [place]".  [name] reported that in the past 2-3 days, she has been in contact with friends from [place] where she used to live, and filled one of them in on her current living situation and challenges, so they created a plan to have her move out there within the next 1-2 months.  Clinician pointed out that [name]'s manic symptoms could contribute to increased impulsivity and recklessness, reminding her of similar plans she had made weeks ago to travel to [place] before she spoke with her mother and became more aware of risks.  Clinician assisted [name] in comparing the costs and benefits of moving to [place] versus staying in [place] to reduce her impulsiveness and reinforce rational decision making skills.  Intervention was effective, as evidenced by [name] participating in this analysis and noting that although there are still more benefits to this transition rather than negatives including efforts towards becoming more independent, she will continue assessing the situation as her plan develops in order to ensure a stable transition.  [name] stated "I need to take my time with this and keep looking for red flags".  Clinician will continue to monitor.  Plan: Meet again in 1 week virtually.   Diagnosis: Bipolar I Disorder, mixed, moderate; Borderline personality disorder; PTSD; Cannabis Use Disorder, moderate; and ADHD, combined type.   [name], LCSW, LCAS 10/04/20

## 2020-10-09 ENCOUNTER — Telehealth: Payer: Self-pay | Admitting: *Deleted

## 2020-10-09 NOTE — Telephone Encounter (Signed)
Transition Care Management Follow-up Telephone Call  Date of discharge and from where: 10-08-2020 [facility]  How have you been since you were released from the hospital?   Any questions or concerns? No  Items Reviewed:  Did the pt receive and understand the discharge instructions provided? yes  Medications obtained and verified? Yes   Any new allergies since your discharge? no  Dietary orders reviewed? yes  Do you have support at home? yes    Functional Questionnaire: (I = Independent and D = Dependent) ADLs: I  Bathing/Dressing- I  Meal Prep- I  Eating- I  Maintaining continence- I  Transferring/Ambulation- I  Managing Meds- I  Follow up appointments reviewed:   PCP Hospital f/u appt confirmed?  3 -9 [facility] f/u appt confirmed? Yes    Are transportation arrangements needed? No   If their condition worsens, is the pt aware to call PCP or go to the Emergency Dept.? yes  Was the patient provided with contact information for the PCP's office or ED? yes  Was to pt encouraged to call back with questions or concerns? yes

## 2020-10-11 ENCOUNTER — Ambulatory Visit (INDEPENDENT_AMBULATORY_CARE_PROVIDER_SITE_OTHER): Payer: Medicaid Other

## 2020-10-11 ENCOUNTER — Ambulatory Visit: Payer: Medicaid Other

## 2020-10-11 ENCOUNTER — Ambulatory Visit (INDEPENDENT_AMBULATORY_CARE_PROVIDER_SITE_OTHER): Payer: Medicaid Other | Admitting: Licensed Clinical Social Worker

## 2020-10-11 ENCOUNTER — Other Ambulatory Visit: Payer: Self-pay

## 2020-10-11 ENCOUNTER — Other Ambulatory Visit (HOSPITAL_COMMUNITY)
Admission: RE | Admit: 2020-10-11 | Discharge: 2020-10-11 | Disposition: A | Discharge status: Home / Self Care | Payer: Medicaid Other | Source: Ambulatory Visit | Attending: Family Medicine | Admitting: Family Medicine

## 2020-10-11 VITALS — BP 118/57 | HR 80 | Ht 59.0 in | Wt 230.0 lb

## 2020-10-11 DIAGNOSIS — F3162 Bipolar disorder, current episode mixed, moderate: Secondary | ICD-10-CM

## 2020-10-11 DIAGNOSIS — Z113 Encounter for screening for infections with a predominantly sexual mode of transmission: Secondary | ICD-10-CM | POA: Diagnosis present

## 2020-10-11 DIAGNOSIS — F909 Attention-deficit hyperactivity disorder, unspecified type: Secondary | ICD-10-CM

## 2020-10-11 DIAGNOSIS — F603 Borderline personality disorder: Secondary | ICD-10-CM

## 2020-10-11 DIAGNOSIS — F4312 Post-traumatic stress disorder, chronic: Secondary | ICD-10-CM

## 2020-10-11 DIAGNOSIS — F1220 Cannabis dependence, uncomplicated: Secondary | ICD-10-CM

## 2020-10-11 NOTE — Progress Notes (Signed)
Virtual Visit via Video Note   I connected with Courtney [name] on 10/11/20 at 11:00am by video enabled telemedicine application and verified that I am speaking with the correct person using two identifiers.   I discussed the limitations, risks, security and privacy concerns of performing an evaluation and management service by video and the availability of in person appointments. I also discussed with the patient that there may be a patient responsible charge related to this service. The patient expressed understanding and agreed to proceed.   I discussed the assessment and treatment plan with the patient. The patient was provided an opportunity to ask questions and all were answered. The patient agreed with the plan and demonstrated an understanding of the instructions.   The patient was advised to call back or seek an in-person evaluation if the symptoms worsen or if the condition fails to improve as anticipated.   I provided 1 hour of non-face-to-face time during this encounter.   [name], LCSW, LCAS ________________________ THERAPIST PROGRESS NOTE   Session Time: 11:00am - 12:00pm  Location: Patient: Patient Home Provider: Clinical Home Office    Participation Level: Active   Behavioral Response: Alert, neatly groomed, casually dressed, euthymic affect/mood   Type of Therapy:  Individual Therapy   Treatment Goals addressed: Depression and Anxiety management; Medication management; Monitoring substance use   Interventions: CBT, motivational interviewing     Summary: Courtney [name] is a 30 year old female that presented for video appointment and is diagnosed with Bipolar I Disorder, mixed, moderate; Borderline personality disorder; PTSD; Cannabis Use Disorder, moderate; and ADHD, combined type.        Suicidal/Homicidal: None; without intent or plan   Therapist Response: Clinician met with Courtney [name] for virtual therapy appointment and assessed for safety, sobriety, and  medication compliance.  Courtney [name] presented as alert, oriented x5, with no evidence or self-report of active SI/HI or A/V H.  Courtney [name] reported that she continues taking medication responsibly.  Clinician inquired about Shanyn's emotional ratings today, as well as any significant changes in thoughts, feelings, or behavior since annual assessment.  Courtney [name] reported scores of 2/10 for depression, 2/10 for anxiety, and 0/10 for mania.  Courtney [name] reported that she continues making arrangements to move out of state in upcoming weeks, and has also been cutting back on THC, stating "I think I may quit.  I don't want to keep depending on it as a crutch".  Clinician utilized motivational interviewing to approach this discussion with Courtney [name] in order to increase motivation to change maladaptive behavior by exploring and resolving areas of ambivalence towards abstinence.  Clinician also provided psychoeducation on variety of physical, mental, and social consequences that could result from ongoing use of THC.  Interventions were effective, as evidenced by Courtney [name] reporting that this session gave her greater insight into the broad negative impact that self-medication with THC could have upon her overall health and wellness, noting increased motivation towards cutting back, with eventual goal of complete abstinence in time.  Courtney [name] reported that she believes using THC is preventing her from achieving goals and promoting laziness, stating "I noticed that when I smoke, I just end up going to sleep, and when something bad happens, I get overwhlemed, and look for the bong immediately, even though I know I could be doing something healthier".  She reported that she would try to reengage with healthier coping skills in following days in an effort to cut back, including writing poetry for emotional outlet.  Clinician will continue to monitor.  Plan: Meet again in 1 week virtually.   Diagnosis:  Bipolar I Disorder, mixed, moderate; Borderline personality disorder; PTSD; Cannabis Use Disorder, moderate; and ADHD, combined type.   [name], LCSW, LCAS 10/11/20

## 2020-10-11 NOTE — Progress Notes (Signed)
Pt presents for routine STI screening.Pt states that she wants to be tested before she moves to another state. Urine sample was sent to the lab for GC/CH. Pt was sent to the lab for blood work.  Rebekka Lobello l Jahel Wavra, CMA

## 2020-10-12 LAB — HIV ANTIBODY (ROUTINE TESTING W REFLEX): HIV Screen 4th Generation wRfx: NONREACTIVE

## 2020-10-12 LAB — RPR: RPR Ser Ql: NONREACTIVE

## 2020-10-12 LAB — GC/CHLAMYDIA PROBE AMP (~~LOC~~) NOT AT ARMC
Chlamydia: NEGATIVE
Comment: NEGATIVE
Comment: NORMAL
Neisseria Gonorrhea: NEGATIVE

## 2020-10-12 LAB — HEPATITIS C ANTIBODY: Hep C Virus Ab: 0.1 s/co ratio (ref 0.0–0.9)

## 2020-10-12 LAB — HEPATITIS B SURFACE ANTIGEN: Hepatitis B Surface Ag: NEGATIVE

## 2020-10-12 NOTE — Progress Notes (Signed)
Chart reviewed - agree with CMA/RN documentation.  ° °

## 2020-10-17 ENCOUNTER — Encounter: Payer: Self-pay | Admitting: Cardiology

## 2020-10-17 ENCOUNTER — Ambulatory Visit: Payer: Medicaid Other | Admitting: Cardiology

## 2020-10-17 ENCOUNTER — Other Ambulatory Visit: Payer: Self-pay

## 2020-10-17 ENCOUNTER — Telehealth (INDEPENDENT_AMBULATORY_CARE_PROVIDER_SITE_OTHER): Payer: Medicaid Other | Admitting: Psychiatry

## 2020-10-17 DIAGNOSIS — F429 Obsessive-compulsive disorder, unspecified: Secondary | ICD-10-CM | POA: Diagnosis not present

## 2020-10-17 DIAGNOSIS — F319 Bipolar disorder, unspecified: Secondary | ICD-10-CM | POA: Diagnosis not present

## 2020-10-17 DIAGNOSIS — R079 Chest pain, unspecified: Secondary | ICD-10-CM

## 2020-10-17 DIAGNOSIS — F603 Borderline personality disorder: Secondary | ICD-10-CM | POA: Diagnosis not present

## 2020-10-17 DIAGNOSIS — F902 Attention-deficit hyperactivity disorder, combined type: Secondary | ICD-10-CM | POA: Diagnosis not present

## 2020-10-17 DIAGNOSIS — F3161 Bipolar disorder, current episode mixed, mild: Secondary | ICD-10-CM

## 2020-10-17 MED ORDER — AMPHETAMINE-DEXTROAMPHET ER 15 MG PO CP24: 15.0000 mg | ORAL_CAPSULE | ORAL | 0 refills | Status: AC

## 2020-10-17 MED ORDER — CLONAZEPAM 1 MG PO TABS: 1.0000 mg | ORAL_TABLET | Freq: Three times a day (TID) | ORAL | 1 refills | Status: AC | PRN

## 2020-10-17 MED ORDER — OXCARBAZEPINE 300 MG PO TABS: 300.0000 mg | ORAL_TABLET | Freq: Two times a day (BID) | ORAL | 0 refills | Status: DC

## 2020-10-17 MED ORDER — DULOXETINE HCL 30 MG PO CPEP: 90.0000 mg | ORAL_CAPSULE | Freq: Every day | ORAL | 1 refills | Status: DC

## 2020-10-17 MED ORDER — CARIPRAZINE HCL 3 MG PO CAPS: 3.0000 mg | ORAL_CAPSULE | Freq: Every day | ORAL | 1 refills | Status: AC

## 2020-10-17 NOTE — Assessment & Plan Note (Signed)
On multiple medications 

## 2020-10-17 NOTE — Assessment & Plan Note (Signed)
BMI 42 

## 2020-10-17 NOTE — Patient Instructions (Signed)
Medication Instructions:  Your physician recommends that you continue on your current medications as directed. Please refer to the Current Medication list given to you today.  *If you need a refill on your cardiac medications before your next appointment, please call your pharmacy*  Lab Work: NONE ordered at this time of appointment   If you have labs (blood work) drawn today and your tests are completely normal, you will receive your results only by: Marland Kitchen MyChart Message (if you have MyChart) OR . A paper copy in the mail If you have any lab test that is abnormal or we need to change your treatment, we will call you to review the results.  Testing/Procedures: NONE ordered at this time of appointment   Follow-Up: At [facility], you and your health needs are our priority.  As part of our continuing mission to provide you with exceptional heart care, we have created designated Provider Care Teams.  These Care Teams include your primary Cardiologist (physician) and Advanced Practice Providers (APPs -  Physician Assistants and Nurse Practitioners) who all work together to provide you with the care you need, when you need it.  Your next appointment:   As Needed if symptoms worsen of fail to improve    The format for your next appointment:   In Person  Provider:   You may see Thurmon Fair, MD or one of the following Advanced Practice Providers on your designated Care Team:    Azalee Course, PA-C  Micah Flesher, [place] or   Judy Pimple, [place]   Other Instructions
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
## 2020-10-17 NOTE — Assessment & Plan Note (Signed)
Negative Nuclear stress March 2021, normal echo March 2021 Seen at ED ([facility]) 10/07/2020 and told to f/u here. All labs normal 2/27 including negative Troponin x 1

## 2020-10-18 ENCOUNTER — Ambulatory Visit (INDEPENDENT_AMBULATORY_CARE_PROVIDER_SITE_OTHER): Payer: Medicaid Other | Admitting: Licensed Clinical Social Worker

## 2020-10-18 DIAGNOSIS — F1220 Cannabis dependence, uncomplicated: Secondary | ICD-10-CM

## 2020-10-18 DIAGNOSIS — F603 Borderline personality disorder: Secondary | ICD-10-CM | POA: Diagnosis not present

## 2020-10-18 DIAGNOSIS — F4310 Post-traumatic stress disorder, unspecified: Secondary | ICD-10-CM

## 2020-10-18 DIAGNOSIS — F3162 Bipolar disorder, current episode mixed, moderate: Secondary | ICD-10-CM | POA: Diagnosis not present

## 2020-10-18 DIAGNOSIS — F902 Attention-deficit hyperactivity disorder, combined type: Secondary | ICD-10-CM

## 2020-10-18 NOTE — Progress Notes (Signed)
Virtual Visit via Video Note   I connected with Courtney [name] on 10/18/20 at 11:00am by video enabled telemedicine application and verified that I am speaking with the correct person using two identifiers.   I discussed the limitations, risks, security and privacy concerns of performing an evaluation and management service by video and the availability of in person appointments. I also discussed with the patient that there may be a patient responsible charge related to this service. The patient expressed understanding and agreed to proceed.   I discussed the assessment and treatment plan with the patient. The patient was provided an opportunity to ask questions and all were answered. The patient agreed with the plan and demonstrated an understanding of the instructions.   The patient was advised to call back or seek an in-person evaluation if the symptoms worsen or if the condition fails to improve as anticipated.   I provided 30 minutes of non-face-to-face time during this encounter.   [name], LCSW, LCAS ________________________ THERAPIST PROGRESS NOTE   Session Time: 11:00am - 11:30am  Location: Patient: Patient Home Provider: [facility] [place] [facility]    Participation Level: Active   Behavioral Response: Alert, neatly groomed, casually dressed, anxious affect/mood   Type of Therapy:  Individual Therapy   Treatment Goals addressed: Depression and Anxiety management; Medication management; Monitoring substance use   Interventions: CBT, relapse prevention    Summary: Courtney [name] is a 30 year old female that presented for video appointment and is diagnosed with Bipolar I Disorder, mixed, moderate; Borderline personality disorder; PTSD; Cannabis Use Disorder, moderate; and ADHD, combined type.        Suicidal/Homicidal: None; without intent or plan   Therapist Response: Clinician met with Courtney [name] for virtual therapy session and assessed for safety, sobriety, and medication  compliance.  Courtney [name] presented on time and was alert, oriented x5, with no evidence or self-report of active SI/HI or A/V H.  Brayley reported ongoing compliance with medication, but acknowledged that she did relapse on marijuana after 10 days of sobriety. Clinician inquired about Courtney [name]'s current emotional ratings, as well as any significant changes in thoughts, feelings, or behavior since last check-in.  Courtney [name] reported scores of 0/10 for depression, 2/10 for anxiety, and 0/10 for mania.  Courtney [name] reported that she has not experienced any panic attacks. She reported that she moved out of her friend's shed over the weekend and into her brother's apartment in [place], as he felt that would be a more stable living situation for her.  Courtney [name] reported that she far is has been good, as her brother has become her workout partner, she has more space, and likes the roommates' company.  Clinician revisited conversation from previous session with Courtney [name] regarding her intent to quit marijuana, and utilized relapse prevention approach in order to identify any internal/external triggers which contributed to this event, as well as healthier coping strategies which could have been implemented instead to maintain sobriety.  Courtney [name] reported that during the moving process, she rolled her ankle, which was very painful, and when one of the roommates learned of this, they offered to share their marijuana with her, and she impulsively agreed.  Courtney [name] acknowledged that although this helped with pain, she felt guilty afterward, and could have approached the situation differently by taking OTC medication, elevating the leg and applying hot/cold press, in addition to using her TENS unit, or outreaching PCP for additional guidance.  Intervention was effective, as evidenced by Courtney [name] reporting that this session increased her awareness of how  everyday challenges can trigger her impulse to use marijuana, as well as  alternative approaches that could be utilized in the future instead to prevent relapse again, stating "I still want to quit, and need to push myself to avoid giving in so easily".  Clinician encouraged Courtney [name] to monitor condition of ankle closely and seek medical attention if improvement isn't seen in following days.  Clinician will continue to monitor.                       Plan: Meet again in 1 week virtually.   Diagnosis: Bipolar I Disorder, mixed, moderate; Borderline personality disorder; PTSD; Cannabis Use Disorder, moderate; and ADHD, combined type.   [name], LCSW, LCAS 10/18/20

## 2020-10-25 ENCOUNTER — Ambulatory Visit (INDEPENDENT_AMBULATORY_CARE_PROVIDER_SITE_OTHER): Payer: Medicaid Other | Admitting: Licensed Clinical Social Worker

## 2020-10-25 ENCOUNTER — Other Ambulatory Visit: Payer: Self-pay

## 2020-10-25 DIAGNOSIS — F1220 Cannabis dependence, uncomplicated: Secondary | ICD-10-CM | POA: Diagnosis not present

## 2020-10-25 DIAGNOSIS — F3162 Bipolar disorder, current episode mixed, moderate: Secondary | ICD-10-CM | POA: Diagnosis not present

## 2020-10-25 DIAGNOSIS — F603 Borderline personality disorder: Secondary | ICD-10-CM

## 2020-10-25 DIAGNOSIS — F4310 Post-traumatic stress disorder, unspecified: Secondary | ICD-10-CM | POA: Diagnosis not present

## 2020-10-25 DIAGNOSIS — F902 Attention-deficit hyperactivity disorder, combined type: Secondary | ICD-10-CM

## 2020-10-25 NOTE — Progress Notes (Signed)
Virtual Visit via Video Note   I connected with Courtney [name] on 10/25/20 at 11:00am by video enabled telemedicine application and verified that I am speaking with the correct person using two identifiers.   I discussed the limitations, risks, security and privacy concerns of performing an evaluation and management service by video and the availability of in person appointments. I also discussed with the patient that there may be a patient responsible charge related to this service. The patient expressed understanding and agreed to proceed.   I discussed the assessment and treatment plan with the patient. The patient was provided an opportunity to ask questions and all were answered. The patient agreed with the plan and demonstrated an understanding of the instructions.   The patient was advised to call back or seek an in-person evaluation if the symptoms worsen or if the condition fails to improve as anticipated.   I provided 30 minutes of non-face-to-face time during this encounter.   Courtney Flood, LCSW, LCAS ________________________ THERAPIST PROGRESS NOTE   Session Time: 11:00am - 11:30am  Location: Patient: Patient Home Provider: [facility] [place] [facility]    Participation Level: Active   Behavioral Response: Alert, neatly groomed, casually dressed, euthymic affect/mood   Type of Therapy:  Individual Therapy   Treatment Goals addressed: Depression and Anxiety management; Medication management; Monitoring substance use   Interventions: CBT, mindful breathing meditation     Summary: Courtney [name] is a 30 year old female that presented for video appointment and is diagnosed with Bipolar I Disorder, mixed, moderate; Borderline personality disorder; PTSD; Cannabis Use Disorder, moderate; and ADHD, combined type.        Suicidal/Homicidal: None; without intent or plan   Therapist Response: Clinician met with Courtney [name] for virtual therapy appointment and assessed for safety, sobriety, and  medication compliance.  Courtney [name] presented for session on time and was alert, oriented x5, with no evidence or self-report of active SI/HI or A/V H.  Courtney [name] reported that she continues managing prescriptions responsibly, but has begun using marijuana again regularly, stating "I'm using everyday again.  I feel like its helping me again right now, but I still want to keep it from becoming a crutch".  Clinician inquired about whether Courtney [name] has had any significant changes in thoughts, feelings, or behavior since last check-in.  Courtney [name] reported scores of 0/10 for depression, 0/10 for anxiety, 3/10 for anger/irritability, and 0/10 for mania.  Courtney [name] denied any reoccurrence of panic attacks.  Courtney [name] reported that she has been adjusting to living in [place] and committing time to meeting new friends and engaging in self-care activities like exercise.  Courtney [name] reported that she is no longer committed to moving to [place], stating "Everything I need is here.  I spoke with my parents and brother, and they convinced me it wasn't a good idea.  They've been a huge help for me".  Courtney [name] reported that one goal she has is to "Be more focused on the present", and asked clinician today for assistance with this.  Clinician completed mindful breathing meditation exercise with Courtney [name] today focused upon maintaining relaxed breathing rhythm for 10 minutes while allowing invasive thoughts and feelings to be acknowledged, but then let go off. Intervention was effective, as evidenced by Courtney [name] successfully participating in activity and noting that she had fallen out of practice with her meditation, but reviewing this exercise reminded her of it's benefits, so she will try to begin doing it daily again for self-care.  Clinician encouraged Courtney [name] to stay mindful of negative consequences  resulting from her ongoing THC use and consider substituting for healthier coping skills like this one.  Clinician  will  continue to monitor.                       Plan: Meet again in 1 week virtually.   Diagnosis: Bipolar I Disorder, mixed, moderate; Borderline personality disorder; PTSD; Cannabis Use Disorder, moderate; and ADHD, combined type.   Courtney [name], [place], LCAS 10/25/20

## 2020-11-01 ENCOUNTER — Other Ambulatory Visit: Payer: Self-pay

## 2020-11-01 ENCOUNTER — Ambulatory Visit (INDEPENDENT_AMBULATORY_CARE_PROVIDER_SITE_OTHER): Payer: Medicaid Other | Admitting: Licensed Clinical Social Worker

## 2020-11-01 DIAGNOSIS — F902 Attention-deficit hyperactivity disorder, combined type: Secondary | ICD-10-CM

## 2020-11-01 DIAGNOSIS — F4310 Post-traumatic stress disorder, unspecified: Secondary | ICD-10-CM | POA: Diagnosis not present

## 2020-11-01 DIAGNOSIS — F1220 Cannabis dependence, uncomplicated: Secondary | ICD-10-CM

## 2020-11-01 DIAGNOSIS — F3162 Bipolar disorder, current episode mixed, moderate: Secondary | ICD-10-CM

## 2020-11-01 DIAGNOSIS — F603 Borderline personality disorder: Secondary | ICD-10-CM | POA: Diagnosis not present

## 2020-11-01 NOTE — Progress Notes (Signed)
Virtual Visit via Video Note   I connected with Courtney [name] on 11/01/20 at 11:00am by video enabled telemedicine application and verified that I am speaking with the correct person using two identifiers.   I discussed the limitations, risks, security and privacy concerns of performing an evaluation and management service by video and the availability of in person appointments. I also discussed with the patient that there may be a patient responsible charge related to this service. The patient expressed understanding and agreed to proceed.   I discussed the assessment and treatment plan with the patient. The patient was provided an opportunity to ask questions and all were answered. The patient agreed with the plan and demonstrated an understanding of the instructions.   The patient was advised to call back or seek an in-person evaluation if the symptoms worsen or if the condition fails to improve as anticipated.   I provided 45 minutes of non-face-to-face time during this encounter.   [name], LCSW, LCAS ________________________ THERAPIST PROGRESS NOTE   Session Time: 11:00am - 11:45am  Location: Patient: Patient Home Provider: [facility] [place] [facility]    Participation Level: Active   Behavioral Response: Alert, neatly groomed, casually dressed, anxious affect/mood   Type of Therapy:  Individual Therapy   Treatment Goals addressed: Depression and Anxiety management; Medication management; Monitoring substance use   Interventions: CBT, psychoeducation on hallucinogens, relapse prevention       Summary: Courtney [name] is a 30 year old female that presented for therapy appointment and is diagnosed with Bipolar I Disorder, mixed, moderate; Borderline personality disorder; PTSD; Cannabis Use Disorder, moderate; and ADHD, combined type.        Suicidal/Homicidal: None; without intent or plan   Therapist Response: Clinician met with Courtney [name] for virtual therapy session and assessed for  safety, sobriety, and medication compliance.  Courtney [name] presented for appointment on time and was alert, oriented x5, with no evidence or self-report of active SI/HI or A/V H.  Courtney [name] reported ongoing compliance with medications and stated "I have been rarely smoking marijuana, maybe just twice this week".  Clinician inquired about whether Courtney [name] has had any significant changes in thoughts, feelings, or behavior since previous check-in.  Courtney [name] reported scores of 0/10 for depression, 2/10 for anxiety, 0/10 for anger/irritability, and 0/10 for mania.  Courtney [name] denied experiencing any reoccurrence of panic attacks.  Courtney [name] reported that she did end up consuming an 1/8th of psilocybin mushrooms Friday and cried throughout the experience, stating "It wasn't good.  It brought up a lot of stuff from the past".  Clinician provided psychoeducation on the mental and physical consequences that can result from use of this particular substance and encouraged her to abstain from this in the future based upon negative impact it had upon her mood and potential to open up past trauma.  Courtney [name] agreed with this suggestion, stating "I got really sad, irritable, and snappy.  It felt like it messed with my brain a bit".  Courtney [name] reported that discussing this made her more motivated to cut back from overall substance use, stating "I could be doing so much more productive stuff like making jewelry, and working on myself".  Clinician revisited discussion on relapse prevention techniques with Courtney [name] to aid in preventing future use, including practice of refusal skills which could be used to turn down offers of drugs in social settings.  Intervention was effective, as evidenced by Courtney [name] reporting that it was helpful to discuss recent substance use patterns, and explore coping strategies to improve  abstinence efforts, stating "I know that I can have a good time without being intoxicated".  Clinician will continue to  monitor.                       Plan: Meet again in 1 week virtually.   Diagnosis: Bipolar I Disorder, mixed, moderate; Borderline personality disorder; PTSD; Cannabis Use Disorder, moderate; and ADHD, combined type.   [name], [place], LCAS 11/01/20

## 2020-11-08 ENCOUNTER — Ambulatory Visit (HOSPITAL_COMMUNITY): Payer: Medicaid Other | Admitting: Licensed Clinical Social Worker

## 2020-11-08 ENCOUNTER — Other Ambulatory Visit: Payer: Self-pay

## 2020-11-09 ENCOUNTER — Ambulatory Visit (INDEPENDENT_AMBULATORY_CARE_PROVIDER_SITE_OTHER): Payer: No Typology Code available for payment source | Admitting: Licensed Clinical Social Worker

## 2020-11-09 ENCOUNTER — Other Ambulatory Visit: Payer: Self-pay

## 2020-11-09 DIAGNOSIS — F902 Attention-deficit hyperactivity disorder, combined type: Secondary | ICD-10-CM

## 2020-11-09 DIAGNOSIS — F1220 Cannabis dependence, uncomplicated: Secondary | ICD-10-CM | POA: Diagnosis not present

## 2020-11-09 DIAGNOSIS — F4310 Post-traumatic stress disorder, unspecified: Secondary | ICD-10-CM | POA: Diagnosis not present

## 2020-11-09 DIAGNOSIS — F603 Borderline personality disorder: Secondary | ICD-10-CM

## 2020-11-09 DIAGNOSIS — F3162 Bipolar disorder, current episode mixed, moderate: Secondary | ICD-10-CM

## 2020-11-09 NOTE — Progress Notes (Signed)
Virtual Visit via Video Note   I connected with Courtney [name] on 11/09/20 at 10:00am by video enabled telemedicine application and verified that I am speaking with the correct person using two identifiers.   I discussed the limitations, risks, security and privacy concerns of performing an evaluation and management service by video and the availability of in person appointments. I also discussed with the patient that there may be a patient responsible charge related to this service. The patient expressed understanding and agreed to proceed.   I discussed the assessment and treatment plan with the patient. The patient was provided an opportunity to ask questions and all were answered. The patient agreed with the plan and demonstrated an understanding of the instructions.   The patient was advised to call back or seek an in-person evaluation if the symptoms worsen or if the condition fails to improve as anticipated.   I provided 36 minutes of non-face-to-face time during this encounter.   [name], LCSW, LCAS ________________________ THERAPIST PROGRESS NOTE   Session Time: 10:00am - 10:36am  Location: Patient: Patient Home Provider: [facility] Office    Participation Level: Active   Behavioral Response: Alert, neatly groomed, casually dressed, euthymic affect/mood   Type of Therapy:  Individual Therapy   Treatment Goals addressed: Depression and Anxiety management; Medication management; Monitoring substance use   Interventions: CBT, relapse prevention     Summary: Courtney [name] is a 30 year old female that presented for therapy appointment and is diagnosed with Bipolar I Disorder, mixed, moderate; Borderline personality disorder; PTSD; Cannabis Use Disorder, moderate; and ADHD, combined type.        Suicidal/Homicidal: None; without intent or plan   Therapist Response: Clinician met with Courtney [name] for virtual therapy appointment and assessed for safety, sobriety, and  medication compliance.  Courtney [name] presented for today's session on time and was alert, oriented x5, with no evidence or self-report of active SI/HI or A/V H.  Courtney [name] reported that she continues taking medication responsibly.  Clinician inquired about whether Courtney [name] has had any significant changes in thoughts, feelings, or behavior since last check-in.  Courtney [name] reported scores of 0/10 for depression, 0/10 for anxiety, 0/10 for anger/irritability, and 0/10 for mania.  Courtney [name] denied experiencing any panic attacks.  Courtney [name] reported that over the past week she has been productive and happy, exercising daily, working on Teacher, adult education, meditating, reading, practicing affirmations, and avoiding too much idle time.  She acknowledged that she has been smoking marijuana daily again, and began to provide reasons for this behavior, stating "I know I'm trying to justify it".  Clinician completed a handout with Courtney [name] today focused on the topic of relapse justifications to assist in abstinence efforts, including examples such as blaming someone else for using again, experiencing catastrophic events, using for a specific purpose, or self-medicating negative emotions. Clinician facilitated discussion on which ones applied to Manpreet's current predicament, and strategies for avoiding relapse such as substituting for healthier coping activities.  Intervention was effective, as evidenced by Courtney [name] reporting that this handout helped increase insight into her relapse justifications, including anticipating catastrophic events will happen and requiring marijuana to self-medicate resulting feelings of anxiety and fear, as well as having a specific purpose in mind, such as addressing physical pain from her ongoing medical conditions.  Courtney [name] reported that she remains ambivalent about quitting marijuana completely, but is more open to cutting back to reduce dependence. She stated "I know I go back and forth, but I still  don't want it to be  a crutch". Clinician will continue to monitor.                       Plan: Meet again in 1 week virtually.   Diagnosis: Bipolar I Disorder, mixed, moderate; Borderline personality disorder; PTSD; Cannabis Use Disorder, moderate; and ADHD, combined type.   [name], LCSW, LCAS 11/09/20

## 2020-11-12 ENCOUNTER — Ambulatory Visit: Payer: Medicaid Other | Admitting: Internal Medicine

## 2020-11-15 ENCOUNTER — Ambulatory Visit (INDEPENDENT_AMBULATORY_CARE_PROVIDER_SITE_OTHER): Payer: Medicaid Other | Admitting: Licensed Clinical Social Worker

## 2020-11-15 ENCOUNTER — Other Ambulatory Visit: Payer: Self-pay

## 2020-11-15 DIAGNOSIS — F603 Borderline personality disorder: Secondary | ICD-10-CM

## 2020-11-15 DIAGNOSIS — F1220 Cannabis dependence, uncomplicated: Secondary | ICD-10-CM | POA: Diagnosis not present

## 2020-11-15 DIAGNOSIS — F4310 Post-traumatic stress disorder, unspecified: Secondary | ICD-10-CM | POA: Diagnosis not present

## 2020-11-15 DIAGNOSIS — F3162 Bipolar disorder, current episode mixed, moderate: Secondary | ICD-10-CM

## 2020-11-15 DIAGNOSIS — F902 Attention-deficit hyperactivity disorder, combined type: Secondary | ICD-10-CM
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
## 2020-11-22 ENCOUNTER — Ambulatory Visit (INDEPENDENT_AMBULATORY_CARE_PROVIDER_SITE_OTHER): Payer: No Typology Code available for payment source | Admitting: Licensed Clinical Social Worker

## 2020-11-22 ENCOUNTER — Other Ambulatory Visit: Payer: Self-pay

## 2020-11-22 DIAGNOSIS — F902 Attention-deficit hyperactivity disorder, combined type: Secondary | ICD-10-CM

## 2020-11-22 DIAGNOSIS — F3162 Bipolar disorder, current episode mixed, moderate: Secondary | ICD-10-CM | POA: Diagnosis not present

## 2020-11-22 DIAGNOSIS — F1220 Cannabis dependence, uncomplicated: Secondary | ICD-10-CM

## 2020-11-22 DIAGNOSIS — F4310 Post-traumatic stress disorder, unspecified: Secondary | ICD-10-CM

## 2020-11-22 DIAGNOSIS — F603 Borderline personality disorder: Secondary | ICD-10-CM | POA: Diagnosis not present
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
## 2020-11-29 ENCOUNTER — Ambulatory Visit (INDEPENDENT_AMBULATORY_CARE_PROVIDER_SITE_OTHER): Payer: No Typology Code available for payment source | Admitting: Licensed Clinical Social Worker

## 2020-11-29 ENCOUNTER — Other Ambulatory Visit: Payer: Self-pay

## 2020-11-29 DIAGNOSIS — F1220 Cannabis dependence, uncomplicated: Secondary | ICD-10-CM | POA: Diagnosis not present

## 2020-11-29 DIAGNOSIS — F4310 Post-traumatic stress disorder, unspecified: Secondary | ICD-10-CM | POA: Diagnosis not present

## 2020-11-29 DIAGNOSIS — F603 Borderline personality disorder: Secondary | ICD-10-CM | POA: Diagnosis not present

## 2020-11-29 DIAGNOSIS — F3162 Bipolar disorder, current episode mixed, moderate: Secondary | ICD-10-CM

## 2020-11-29 DIAGNOSIS — F902 Attention-deficit hyperactivity disorder, combined type: Secondary | ICD-10-CM
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
## 2020-12-04 ENCOUNTER — Telehealth (HOSPITAL_COMMUNITY): Payer: No Typology Code available for payment source | Admitting: Psychiatry

## 2020-12-04 ENCOUNTER — Encounter (HOSPITAL_COMMUNITY): Payer: Self-pay

## 2020-12-04 ENCOUNTER — Other Ambulatory Visit: Payer: Self-pay
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
## 2020-12-06 ENCOUNTER — Ambulatory Visit (HOSPITAL_COMMUNITY): Payer: No Typology Code available for payment source | Admitting: Licensed Clinical Social Worker

## 2020-12-13 ENCOUNTER — Ambulatory Visit (INDEPENDENT_AMBULATORY_CARE_PROVIDER_SITE_OTHER): Payer: No Typology Code available for payment source | Admitting: Licensed Clinical Social Worker

## 2020-12-13 ENCOUNTER — Other Ambulatory Visit: Payer: Self-pay

## 2020-12-13 DIAGNOSIS — F4310 Post-traumatic stress disorder, unspecified: Secondary | ICD-10-CM | POA: Diagnosis not present

## 2020-12-13 DIAGNOSIS — F603 Borderline personality disorder: Secondary | ICD-10-CM | POA: Diagnosis not present

## 2020-12-13 DIAGNOSIS — F1220 Cannabis dependence, uncomplicated: Secondary | ICD-10-CM | POA: Diagnosis not present

## 2020-12-13 DIAGNOSIS — F902 Attention-deficit hyperactivity disorder, combined type: Secondary | ICD-10-CM

## 2020-12-13 DIAGNOSIS — F3162 Bipolar disorder, current episode mixed, moderate: Secondary | ICD-10-CM | POA: Diagnosis not present

## 2020-12-13 NOTE — Progress Notes (Signed)
Virtual Visit via Video Note   I connected with Courtney [name] on 12/13/20 at 11:00am by video enabled telemedicine application and verified that I am speaking with the correct person using two identifiers.   I discussed the limitations, risks, security and privacy concerns of performing an evaluation and management service by video and the availability of in person appointments. I also discussed with the patient that there may be a patient responsible charge related to this service. The patient expressed understanding and agreed to proceed.   I discussed the assessment and treatment plan with the patient. The patient was provided an opportunity to ask questions and all were answered. The patient agreed with the plan and demonstrated an understanding of the instructions.   The patient was advised to call back or seek an in-person evaluation if the symptoms worsen or if the condition fails to improve as anticipated.   I provided 45 minutes of non-face-to-face time during this encounter.   [name], LCSW, LCAS ________________________ THERAPIST PROGRESS NOTE   Session Time: 11:00am - 11:45am  Location: Patient: Patient Home Provider: [facility] [place] [facility]    Participation Level: Active   Behavioral Response: Alert, neatly groomed, casually dressed, depressed affect/mood   Type of Therapy:  Individual Therapy   Treatment Goals addressed: Depression and Anxiety management; Medication management; Monitoring substance use; Increasing independence    Interventions: CBT, problem solving    Summary: Courtney [name] is a 30 year old female that presented for therapy appointment and is diagnosed with Bipolar I Disorder, mixed, moderate; Borderline personality disorder; PTSD; Cannabis Use Disorder, moderate; and ADHD, combined type.        Suicidal/Homicidal: None; without intent or plan   Therapist Response: Clinician met with Courtney [name] for virtual therapy appointment and assessed for safety,  sobriety, and medication compliance.  Courtney [name] presented for today's session on time and was alert, oriented x5, with no evidence or self-report of active SI/HI or A/V H.  Courtney [name] reported compliance with medication and has been smoking marijuana every other day, stating "Its definitely not as much".  Clinician inquired about whether [name] has had any significant changes in thoughts, feelings, or behavior since last check-in.  Courtney [name] reported scores of 3/10 for depression, 2/10 for anxiety, 0/10 for anger/irritability, and 0/10 for mania.  [name] denied any reoccurrence of panic attacks.  Courtney [name] reported that things have been more stressful living with her brother, as the relationship he is currently in has negatively affected her.  She reported that she had acquired a part-time job which helped with income and kept her busy, but she had a disagreement with the person her brother was dating, and they were providing transportation, so now she is unemployed again.  Courtney [name] reported that this instability and conflict within the household has her considering alternative options.  Clinician assisted Courtney [name] in weighing costs and benefits of remaining in present living space with her brother and this person.  Clinician also explored alternative living options available to Courtney [name], including resources for applying to subsidized housing through [employer].  Courtney [name] reported that she cannot stay with family, and acknowledged that her mental health would greatly suffer if she became homeless again, so she will begin process of applying to [facility], secure extra income to save for transition, and let reliable friends know about her need for housing to ensure a 'Plan B' if she is kicked out unexpectedly.  Interventions were effective, as evidenced by Courtney [name] reporting that discussing this issue alleviated some of her anxiety,  and provided her with helpful suggestions on how to pursue more stable,  consistent living environment.  Clinician will continue to monitor.        Plan: Meet again in 1 week virtually.   Diagnosis: Bipolar I Disorder, mixed, moderate; Borderline personality disorder; PTSD; Cannabis Use Disorder, moderate; and ADHD, combined type.   [name], LCSW, LCAS 12/13/20

## 2020-12-20 ENCOUNTER — Other Ambulatory Visit: Payer: Self-pay

## 2020-12-20 ENCOUNTER — Ambulatory Visit (INDEPENDENT_AMBULATORY_CARE_PROVIDER_SITE_OTHER): Payer: No Typology Code available for payment source | Admitting: Licensed Clinical Social Worker

## 2020-12-20 DIAGNOSIS — F603 Borderline personality disorder: Secondary | ICD-10-CM | POA: Diagnosis not present

## 2020-12-20 DIAGNOSIS — F1220 Cannabis dependence, uncomplicated: Secondary | ICD-10-CM | POA: Diagnosis not present

## 2020-12-20 DIAGNOSIS — F902 Attention-deficit hyperactivity disorder, combined type: Secondary | ICD-10-CM

## 2020-12-20 DIAGNOSIS — F4310 Post-traumatic stress disorder, unspecified: Secondary | ICD-10-CM | POA: Diagnosis not present

## 2020-12-20 DIAGNOSIS — F3162 Bipolar disorder, current episode mixed, moderate: Secondary | ICD-10-CM

## 2020-12-20 NOTE — Progress Notes (Signed)
Virtual Visit via Video Note   I connected with Courtney [name] on 12/20/20 at 11:00am by video enabled telemedicine application and verified that I am speaking with the correct person using two identifiers.   I discussed the limitations, risks, security and privacy concerns of performing an evaluation and management service by video and the availability of in person appointments. I also discussed with the patient that there may be a patient responsible charge related to this service. The patient expressed understanding and agreed to proceed.   I discussed the assessment and treatment plan with the patient. The patient was provided an opportunity to ask questions and all were answered. The patient agreed with the plan and demonstrated an understanding of the instructions.   The patient was advised to call back or seek an in-person evaluation if the symptoms worsen or if the condition fails to improve as anticipated.   I provided 1 hour of non-face-to-face time during this encounter.    , LCSW, LCAS ________________________ THERAPIST PROGRESS NOTE   Session Time: 11:00am - 12:00pm  Location: Patient: Patient Home Provider: [facility]    Participation Level: Active   Behavioral Response: Alert, neatly groomed, casually dressed, euthymic affect/mood   Type of Therapy:  Individual Therapy   Treatment Goals addressed: Depression and Anxiety management; Medication management; Monitoring substance use   Interventions: CBT, psychoeducation on risks of DMT, harm reduction     Summary: Courtney [name] is a 30 year old female that presented for therapy appointment and is diagnosed with Bipolar I Disorder, mixed, moderate; Borderline personality disorder; PTSD; Cannabis Use Disorder, moderate; and ADHD, combined type.        Suicidal/Homicidal: None; without intent or plan   Therapist Response: Clinician met with Courtney [name] for virtual therapy session and assessed for safety,  sobriety, and medication compliance.  Courtney [name] presented for today's appointment on time and was alert, oriented x5, with no evidence or self-report of active SI/HI or A/V H.  Courtney [name] reported that she continues taking medication responsibly, and is limiting marijuana use to "Once every couple of days".  Clinician inquired about whether Courtney [name] has had any significant changes in thoughts, feelings, or behavior since previous check-in.  Courtney [name] reported scores of 0/10 for depression, 1/10 for anxiety, 0/10 for anger/irritability, and 0/10 for mania.  Courtney [name] reported that she had one panic attack the other day, but was able to handle this without using THC.  She reported that she plans to go to [place] with a friend this weekend and use DMT for the first time, stating "I've been wanting to do it for a long time and I know you don't approve, but I felt like I should tell you".  Clinician praised Courtney [name] for her honesty, and did advise against doing using this substance.  Clinician encouraged Courtney [name] to abstain from illicit substance use, and provided psychoeducation on the various psychological and physical side effects that could result from use of DMT specifically.  Clinician also reviewed harm reduction strategies with Courtney [name] to enhance safety during this upcoming event, including only using around trusted, sober friends that can monitor her closely and assist if needed, avoiding use of large quantity, choosing a setting without triggers or safety risks, and avoiding use in combination with any other illicit substances.  Clinician also encouraged Courtney [name] to keep her phone charged and close by to call 911 for assistance should adverse symptoms arise.  Interventions were effective, as evidenced by Courtney [name] reporting that although her decision to use DMT could   not be swayed, she would use a minimal amount of this substance due to lack of familiarity, stay around sober people she trusts while under  the influence, and avoid using in dangerous setting such as in a car.  Courtney [name] stated "Its nice to be able to talk about this and get some helpful feedback.  I will be safe, do some more research, and appreciate you looking out for me".  Clinician will continue to monitor.        Plan: Meet again in 1 week virtually.   Diagnosis: Bipolar I Disorder, mixed, moderate; Borderline personality disorder; PTSD; Cannabis Use Disorder, moderate; and ADHD, combined type.     , LCSW, LCAS 12/20/20 

## 2020-12-25 ENCOUNTER — Telehealth (HOSPITAL_COMMUNITY): Payer: Self-pay | Admitting: Family Medicine

## 2020-12-27 ENCOUNTER — Ambulatory Visit (INDEPENDENT_AMBULATORY_CARE_PROVIDER_SITE_OTHER): Payer: No Typology Code available for payment source | Admitting: Licensed Clinical Social Worker

## 2020-12-27 ENCOUNTER — Other Ambulatory Visit: Payer: Self-pay

## 2020-12-27 DIAGNOSIS — F1220 Cannabis dependence, uncomplicated: Secondary | ICD-10-CM | POA: Diagnosis not present

## 2020-12-27 DIAGNOSIS — F3162 Bipolar disorder, current episode mixed, moderate: Secondary | ICD-10-CM | POA: Diagnosis not present

## 2020-12-27 DIAGNOSIS — F603 Borderline personality disorder: Secondary | ICD-10-CM

## 2020-12-27 DIAGNOSIS — F4310 Post-traumatic stress disorder, unspecified: Secondary | ICD-10-CM

## 2020-12-27 DIAGNOSIS — F902 Attention-deficit hyperactivity disorder, combined type: Secondary | ICD-10-CM

## 2020-12-27 NOTE — Progress Notes (Signed)
Virtual Visit via Video Note   I connected with Courtney [name] on 12/27/20 at 11:00am by video enabled telemedicine application and verified that I am speaking with the correct person using two identifiers.   I discussed the limitations, risks, security and privacy concerns of performing an evaluation and management service by video and the availability of in person appointments. I also discussed with the patient that there may be a patient responsible charge related to this service. The patient expressed understanding and agreed to proceed.   I discussed the assessment and treatment plan with the patient. The patient was provided an opportunity to ask questions and all were answered. The patient agreed with the plan and demonstrated an understanding of the instructions.   The patient was advised to call back or seek an in-person evaluation if the symptoms worsen or if the condition fails to improve as anticipated.   I provided 30 minutes of non-face-to-face time during this encounter.   [name], LCSW, LCAS ________________________ THERAPIST PROGRESS NOTE   Session Time: 11:00am - 11:30am  Location: Patient: Patient Home Provider: [facility]    Participation Level: Active   Behavioral Response: Alert, neatly groomed, casually dressed, euthymic affect/mood_   Type of Therapy:  Individual Therapy   Treatment Goals addressed: Depression and Anxiety management; Medication management; Monitoring substance use; Exercising regularly    Interventions: CBT, establishing healthier social media boundaries    Summary: Courtney [name] is a 30 year old female that presented for therapy appointment and is diagnosed with Bipolar I Disorder, mixed, moderate; Borderline personality disorder; PTSD; Cannabis Use Disorder, moderate; and ADHD, combined type.        Suicidal/Homicidal: None; without intent or plan   Therapist Response: Clinician met with Courtney [name] for virtual therapy appointment  and assessed for safety, sobriety, and medication compliance.  Courtney [name] presented for today's session on time and was alert, oriented x5, with no evidence or self-report of active SI/HI or A/V H.  Courtney [name] reported ongoing compliance with medication and is limiting marijuana use to "Once every other day".  Clinician inquired about whether Courtney [name] has had any significant changes in thoughts, feelings, or behavior since last check-in.  Courtney [name] reported scores of 0/10 for depression, 0/10 for anxiety, 0/10 for anger/irritability, and 0/10 for mania.  Courtney [name] reported that she did have 1 panic attack over past week when she thought she lost her cat. Courtney [name] reported that one success has been going to the gym each day and focusing on health more.  She reported that she did go to [place] to meet friends and do DMT, but turned down the offer, stating "I thought about what you said, and didn't feel comfortable so I passed on it".  Courtney [name] reported that one issue at present is spending too much time on social media, which has been distracting from healthy self-care activities.  Clinician discussed a handout with Courtney [name] which detailed the negative effects social media can have on mental health (I.e. addictive qualities, decreasing truly social behavior, promoting comparison to others, and increasing feelings of sadness/depression), as well as strategies for setting healthier boundaries on these platforms, including setting time limits for use, choosing only to contribute to posts/interactions in meaningful ways, and cutting ties with negative people/organizations.  Intervention was effective, as evidenced by Courtney [name] expressing receptiveness to several strategies offered, reporting that she deleted several social media applications on her phone a few weeks ago due to the negativity they exposed her to.  Rhemi stated "I think taking breaks  from all of that is great for me.  It feels refreshing and I can  focus more on things like meditation and jewelry making".  Adelayde requested to end meeting at 30 minute mark due to her phone overheating.  Clinician will continue to monitor.        Plan: Meet again in 1 week virtually.   Diagnosis: Bipolar I Disorder, mixed, moderate; Borderline personality disorder; PTSD; Cannabis Use Disorder, moderate; and ADHD, combined type.   [name], LCSW, LCAS 12/27/20

## 2021-01-11 ENCOUNTER — Other Ambulatory Visit: Payer: Self-pay

## 2021-01-11 ENCOUNTER — Ambulatory Visit (INDEPENDENT_AMBULATORY_CARE_PROVIDER_SITE_OTHER): Payer: No Typology Code available for payment source | Admitting: Licensed Clinical Social Worker

## 2021-01-11 DIAGNOSIS — F4310 Post-traumatic stress disorder, unspecified: Secondary | ICD-10-CM

## 2021-01-11 DIAGNOSIS — F902 Attention-deficit hyperactivity disorder, combined type: Secondary | ICD-10-CM

## 2021-01-11 DIAGNOSIS — F3162 Bipolar disorder, current episode mixed, moderate: Secondary | ICD-10-CM

## 2021-01-11 DIAGNOSIS — F603 Borderline personality disorder: Secondary | ICD-10-CM | POA: Diagnosis not present

## 2021-01-11 DIAGNOSIS — F1220 Cannabis dependence, uncomplicated: Secondary | ICD-10-CM | POA: Diagnosis not present

## 2021-01-11 NOTE — Progress Notes (Signed)
Virtual Visit via Video Note I connected with Courtney [name] on 01/11/21 at 9:00am by video enabled telemedicine application and verified that I am speaking with the correct person using two identifiers.   I discussed the limitations, risks, security and privacy concerns of performing an evaluation and management service by video and the availability of in person appointments. I also discussed with the patient that there may be a patient responsible charge related to this service. The patient expressed understanding and agreed to proceed.   I discussed the assessment and treatment plan with the patient. The patient was provided an opportunity to ask questions and all were answered. The patient agreed with the plan and demonstrated an understanding of the instructions.   The patient was advised to call back or seek an in-person evaluation if the symptoms worsen or if the condition fails to improve as anticipated.   I provided 40 minutes of non-face-to-face time during this encounter.   Courtney Flood, LCSW, LCAS ________________________ THERAPIST PROGRESS NOTE   Session Time: 9:00am - 9:40am   Location: Patient: Patient Home Provider: [facility]    Participation Level: Active   Behavioral Response: Alert, neatly groomed, casually dressed, frustrated affect/mood   Type of Therapy:  Individual Therapy   Treatment Goals addressed: Depression and Anxiety management; Medication management; Monitoring substance use; Improving communication skills   Interventions: CBT, conflict resolution skills     Summary: Courtney [name] is a 30 year old female that presented for therapy appointment and is diagnosed with Bipolar I Disorder, mixed, moderate; Borderline personality disorder; PTSD; Cannabis Use Disorder, moderate; and ADHD, combined type.        Suicidal/Homicidal: None; without intent or plan   Therapist Response: Clinician met with Courtney [name] for virtual therapy session and assessed for  safety, sobriety, and medication compliance.  Courtney [name] presented for today's appointment on time and was alert, oriented x5, with no evidence or self-report of active SI/HI or A/V H.  Courtney [name] reported that she continues taking medication as prescribed and has not been smoking marijuana since she began staying with her parents a few days ago.  Clinician inquired about whether Courtney [name] has had any significant changes in thoughts, feelings, or behavior since previous check-in.  Courtney [name] reported scores of 0/10 for depression, 0/10 for anxiety, 2/10 for frustration, and 0/10 for mania.  Courtney [name] denied experiencing any panic attacks recently.  Courtney [name] reported that some family members have come to visit from out of state, so her brother brought her to her parents' house to spend time with them and catch up.  Courtney [name] reported that her mood has been relatively stable following this transition, as she is more mindful of triggers and boundaries. Courtney [name] reported that her frustration is related to dealing with her brother at times, stating "We have our moments, it's to be expected since we're twins".  Clinician reviewed conflict resolution skills with Courtney [name] today in order to help her deal with stressful family interactions.  This included various strategies such as employing the Stop-Think-Act model, paying attention to CLUES (I.e. communicate, listen, understand, explore, summarize), and reviewing 'do's and don'ts' for conflict resolution (I.e. focus on the problem, be assertive, don't assume or act defensively).  Intervention was effective, as evidenced by Courtney [name] actively engaging in discussion on the subject and expressing receptiveness to these strategies, especially Stop-Think-Act.  Courtney [name] stated "I don't ever stop or think.  I just act, and it usually goes bad because my impulsive reactions tend to get me in trouble.  I'm  gonna start using some of these tips because I think a lot of situations  would turn out better".  Clinician encouraged Courtney [name] will continue to monitor.        Plan: Meet again in 1 week virtually.   Diagnosis: Bipolar I Disorder, mixed, moderate; Borderline personality disorder; PTSD; Cannabis Use Disorder, moderate; and ADHD, combined type.   Courtney Flood, LCSW, LCAS 01/11/21

## 2021-01-17 ENCOUNTER — Other Ambulatory Visit: Payer: Self-pay

## 2021-01-17 ENCOUNTER — Ambulatory Visit (INDEPENDENT_AMBULATORY_CARE_PROVIDER_SITE_OTHER): Payer: No Typology Code available for payment source | Admitting: Licensed Clinical Social Worker

## 2021-01-17 DIAGNOSIS — F603 Borderline personality disorder: Secondary | ICD-10-CM

## 2021-01-17 DIAGNOSIS — F3162 Bipolar disorder, current episode mixed, moderate: Secondary | ICD-10-CM

## 2021-01-17 DIAGNOSIS — F902 Attention-deficit hyperactivity disorder, combined type: Secondary | ICD-10-CM

## 2021-01-17 DIAGNOSIS — F1220 Cannabis dependence, uncomplicated: Secondary | ICD-10-CM | POA: Diagnosis not present

## 2021-01-17 DIAGNOSIS — F4310 Post-traumatic stress disorder, unspecified: Secondary | ICD-10-CM

## 2021-01-17 NOTE — Progress Notes (Signed)
Virtual Visit via Video Note I connected with Courtney [name] on 01/17/21 at 2:00pm by video enabled telemedicine application and verified that I am speaking with the correct person using two identifiers.   I discussed the limitations, risks, security and privacy concerns of performing an evaluation and management service by video and the availability of in person appointments. I also discussed with the patient that there may be a patient responsible charge related to this service. The patient expressed understanding and agreed to proceed.   I discussed the assessment and treatment plan with the patient. The patient was provided an opportunity to ask questions and all were answered. The patient agreed with the plan and demonstrated an understanding of the instructions.   The patient was advised to call back or seek an in-person evaluation if the symptoms worsen or if the condition fails to improve as anticipated.   I provided 30 minutes of non-face-to-face time during this encounter.   [name], LCSW, LCAS ________________________ THERAPIST PROGRESS NOTE   Session Time: 2:00pm - 2:30pm   Location: Patient: Patient Home Provider: [facility]    Participation Level: Active   Behavioral Response: Alert, neatly groomed, casually dressed, euthymic affect/mood   Type of Therapy:  Individual Therapy   Treatment Goals addressed: Depression and Anxiety management; Medication management; Monitoring substance use   Interventions: CBT: keeping a weekly/daily schedule        Summary: Courtney [name] is a 30 year old female that presented for therapy appointment and is diagnosed with Bipolar I Disorder, mixed, moderate; Borderline personality disorder; PTSD; Cannabis Use Disorder, moderate; and ADHD, combined type.        Suicidal/Homicidal: None; without intent or plan   Therapist Response: Clinician met with Courtney [name] for virtual therapy appointment and assessed for safety, sobriety, and  medication compliance.  Courtney [name] presented for today's session on time and was alert, oriented x5, with no evidence or self-report of active SI/HI or A/V H.  Courtney [name] reported ongoing compliance with medication and has using marijuana 'dabs' daily since returning home from parents.  Clinician inquired about whether Courtney [name] has had any significant changes in thoughts, feelings, or behavior since last check-in.  Courtney [name] reported scores of 1/10 for depression, 0/10 for anxiety, 0/10 for anger/irritability and 0/10 for mania.  Courtney [name] denied experiencing any panic attacks recently.  Courtney [name] reported that her trip to her parents went well, and stated "It was needed to catch up with everyone".  She reported that she and her brother did have a disagreement, but she utilized conflict resolution skills, and spoke with supports to find a resolution.  Courtney [name] reported that her biggest issue at present is prioritizing her time, noting that she has never been good at following a schedule due to ADHD interference.  Clinician virtually shared a handout with Courtney [name] today in session which was focused upon establishing a daily/weekly schedule for healthy activities which lead to positive experiences, increase productivity and accountability, as well as combat feelings of depression and anxiety. This handout featured templates for typical daily and monthly calendars, in addition to suggestions on how to improve current routine, including maintaining a regular sleep/wake cycle, ensuring adequate time for self-care, and breaking down major goals into smaller, more manageable tasks that can be distributed through the week for improved chance of success.  Clinician tasked Courtney [name] with identifying changes she can make to existing schedule in upcoming week in order to make observable progress in this area.  Intervention was effective, as evidenced by Courtney [name]  engaging in discussion on this subject and noting that her  routine tends to be very chaotic, so she will aim to go to bed at 11pm each night, wake up at 7am, avoid skipping meals throughout the day, meditate from noon until 1pm, create jewelry from 1pm-2pm, work out from 3pm-5pm, and socialize with supports for 30 minutes to 1 hour.  Courtney [name] stated "I already have a planner I bought awhile back.  I just need to actually start writing in it because this is something I need to work on really bad".  Clinician will continue to monitor.        Plan: Meet again in 1 week virtually.   Diagnosis: Bipolar I Disorder, mixed, moderate; Borderline personality disorder; PTSD; Cannabis Use Disorder, moderate; and ADHD, combined type.   [name], LCSW, LCAS 01/17/21

## 2021-01-31 ENCOUNTER — Other Ambulatory Visit: Payer: Self-pay

## 2021-01-31 ENCOUNTER — Ambulatory Visit (INDEPENDENT_AMBULATORY_CARE_PROVIDER_SITE_OTHER): Payer: No Typology Code available for payment source | Admitting: Licensed Clinical Social Worker

## 2021-01-31 DIAGNOSIS — F4310 Post-traumatic stress disorder, unspecified: Secondary | ICD-10-CM | POA: Diagnosis not present

## 2021-01-31 DIAGNOSIS — F3162 Bipolar disorder, current episode mixed, moderate: Secondary | ICD-10-CM

## 2021-01-31 DIAGNOSIS — F1220 Cannabis dependence, uncomplicated: Secondary | ICD-10-CM | POA: Diagnosis not present

## 2021-01-31 DIAGNOSIS — F603 Borderline personality disorder: Secondary | ICD-10-CM

## 2021-01-31 DIAGNOSIS — F902 Attention-deficit hyperactivity disorder, combined type: Secondary | ICD-10-CM

## 2021-01-31 NOTE — Progress Notes (Signed)
Virtual Visit via Video Note  I connected with Courtney [name] on 01/31/21 at 11:05am by video enabled telemedicine application and verified that I am speaking with the correct person using two identifiers.   I discussed the limitations, risks, security and privacy concerns of performing an evaluation and management service by video and the availability of in person appointments. I also discussed with the patient that there may be a patient responsible charge related to this service. The patient expressed understanding and agreed to proceed.   I discussed the assessment and treatment plan with the patient. The patient was provided an opportunity to ask questions and all were answered. The patient agreed with the plan and demonstrated an understanding of the instructions.   The patient was advised to call back or seek an in-person evaluation if the symptoms worsen or if the condition fails to improve as anticipated.   I provided 30 minutes of non-face-to-face time during this encounter.   [name], LCSW, LCAS ________________________ THERAPIST PROGRESS NOTE   Session Time: 11:05am - 11:35am    Location: Patient: Patient Home Provider: [facility]   Participation Level: Active   Behavioral Response: Alert, neatly groomed, casually dressed, depressed affect/mood   Type of Therapy:  Individual Therapy   Treatment Goals addressed: Depression and Anxiety management; Medication management; Monitoring substance use; Exercise routine    Interventions: CBT, problem solving, treatment planning    Summary: Courtney [name] is a 30 year old female that presented for therapy appointment and is diagnosed with Bipolar I Disorder, mixed, moderate; Borderline personality disorder; PTSD; Cannabis Use Disorder, moderate; and ADHD, combined type.        Suicidal/Homicidal: None; without intent or plan   Therapist Response: Clinician met with [name] for virtual therapy session 5 minutes later  than planned due to difficulties in connecting to session on [employer] app.  Clinician assessed for safety, sobriety, and medication compliance.  [name] presented for today's appointment alert, oriented x5, with no evidence or self-report of active SI/HI or A/V H.  [name] reported that she has not been taking her mood stabilizer as consistently, missing around 5 days over the last week.  She reported that she has also been using marijuana daily.  Clinician inquired about whether [name] has had any significant changes in thoughts, feelings, or behavior since previous check-in.  [name] reported scores of 1/10 for depression, 1/10 for anxiety, 0/10 for anger/irritability and 0/10 for mania.  [name] denied experiencing any panic attacks recently.  [name] reported that she has been feeling more depressed over recent days, including symptoms of fatigue and excessive sleeping.  Clinician reminded [name] of importance in taking her medications and the negative effect noncompliance can have upon her mood and energy levels.  Clinician provided her with suggestions on how to improve compliance in following days, including placing medication in a more visible location, setting a reminder on her phone for morning and nighttime doses, as well as asking supports to remind her to take them.  [name] acknowledged that she did have a reminder on her phone, but deleted it by accident last week, so she set a new one today, and did take her morning dose.  Clinician also recommended that [name] set aside appropriate time for self-care this week following recent increase in depressive symptoms, and discussed self-care activities with her which could have positive impact upon mood.  [name] reported that she would benefit from spending time working on her art, making and writing music, playing games, continuing to  visit the gym 4-5x per week, and playing with her cat.  She also reported that she would reach out  to friends about social events such as bowling or doing karaoke together.  Clinician recommended updating treatment plan today as well due to length of time that has passed, and collaborated with her to revise treatment plan as follows with her verbal consent: Meet with clinician virtually once per week for therapy to address ongoing progress towards goals and any barriers to success that need to be addressed; Meet with psychiatrist once per month to address efficacy of medication and make adjustments as needed to regimen and/or dosage; Take medication daily as prescribed to reduce symptoms and improve overall daily functioning, setting daily reminder on phone for further reinforcement; Reduce depression from average severity of 5/10 down to 3/10 over the next 90 days by engaging in positive self-care activities for 2 hours each day, such as listening to music, watching movies, playing video games, doing makeup, cutting hair, and/or reading; Reduce anxiety from average severity of 4/10 down to 2/10, as well as maintain panic attacks at 0 per week via utilization of 3-4 relaxation techniques daily such as mindful breathing, meditation, progressive muscle relaxation, grounding techniques, and/or positive visualizations; Continue working to become more independent in daily activities in order to increase sense of self-reliance, confidence, and curb dependency on other people; Identify 2-3 issues with communication skills that can be addressed to improve socialization with supports and reduce borderline tendencies within next 90 days;  Write 1 page at a minimum in journal each day regarding thoughts, feelings, and behaviors that arise, with goal of better understanding mood changes for implementation of appropriate copings skills ahead of time ("I want to observe, but not absorb other people's feelings like empaths do"); Exercise 4-5 times per week for 1 hour at a minimum each visit to improve both physical and mental  well-being; Continue working to establish zero balanced budget within next 60 days in an effort to save money each month, and curb impulsive spending behavior; Monitor marijuana/Delta 8 use closely to manage dependence and negative impact upon mental health, motivation, and/or other goal progress; Voluntarily seek hospitalization with assistance from support system and/or medical professionals should SI/HI appear and safety of self or others is determined at risk due to development of intent, plan, and access to means necessary to carry out plan.  Progress is evidenced by [name] setting an alarm to improve medication compliance, identifying self-care activities to engage in this week in order to improve mood, working out at the gym more consistently to improve mental/physical health, and preventing reoccurrence of panic attacks via use of copings skills.  Clinician will continue to monitor.        Plan: Meet again in 1 week virtually.   Diagnosis: Bipolar I Disorder, mixed, moderate; Borderline personality disorder; PTSD; Cannabis Use Disorder, moderate; and ADHD, combined type.   [name], LCSW, LCAS 01/31/21

## 2021-02-07 ENCOUNTER — Other Ambulatory Visit: Payer: Self-pay

## 2021-02-07 ENCOUNTER — Ambulatory Visit (INDEPENDENT_AMBULATORY_CARE_PROVIDER_SITE_OTHER): Payer: No Typology Code available for payment source | Admitting: Licensed Clinical Social Worker

## 2021-02-07 DIAGNOSIS — F4310 Post-traumatic stress disorder, unspecified: Secondary | ICD-10-CM

## 2021-02-07 DIAGNOSIS — F603 Borderline personality disorder: Secondary | ICD-10-CM | POA: Diagnosis not present

## 2021-02-07 DIAGNOSIS — F902 Attention-deficit hyperactivity disorder, combined type: Secondary | ICD-10-CM

## 2021-02-07 DIAGNOSIS — F3162 Bipolar disorder, current episode mixed, moderate: Secondary | ICD-10-CM | POA: Diagnosis not present

## 2021-02-07 DIAGNOSIS — F1220 Cannabis dependence, uncomplicated: Secondary | ICD-10-CM

## 2021-02-07 NOTE — Progress Notes (Signed)
Virtual Visit via Video Note   I connected with Courtney [name] on 02/07/21 at 11:00am by video enabled telemedicine application and verified that I am speaking with the correct person using two identifiers.   I discussed the limitations, risks, security and privacy concerns of performing an evaluation and management service by video and the availability of in person appointments. I also discussed with the patient that there may be a patient responsible charge related to this service. The patient expressed understanding and agreed to proceed.   I discussed the assessment and treatment plan with the patient. The patient was provided an opportunity to ask questions and all were answered. The patient agreed with the plan and demonstrated an understanding of the instructions.   The patient was advised to call back or seek an in-person evaluation if the symptoms worsen or if the condition fails to improve as anticipated.   I provided 35 minutes of non-face-to-face time during this encounter.   [name], LCSW, LCAS ________________________ THERAPIST PROGRESS NOTE   Session Time: 11:00am - 11:35am   Location: Patient: Patient Home Provider: [facility]   Participation Level: Active   Behavioral Response: Alert, neatly groomed, casually dressed, anxious affect/mood   Type of Therapy:  Individual Therapy   Treatment Goals addressed: Depression and Anxiety management; Medication management; Monitoring substance use   Interventions: CBT: challenging intrusive thoughts    Summary: Courtney [name] is a 30 year old female that presented for therapy appointment and is diagnosed with Bipolar I Disorder, mixed, moderate; Borderline personality disorder; PTSD; Cannabis Use Disorder, moderate; and ADHD, combined type.        Suicidal/Homicidal: None; without intent or plan   Therapist Response: Clinician met with Courtney [name] for virtual therapy appointment and assessed for safety, sobriety, and  medication compliance.  Courtney [name] presented for today's session on time and was alert, oriented x5, with no evidence or self-report of active SI/HI or A/V H.  Marolyn reported that she has remained noncompliant with medication since last session and stated "I did take my morning ones, but started skipping the night dose.  I don't know why".  Clinician reminded Courtney [name] of the importance in staying compliant with her medications and risks involved in missing doses.  Clinician reviewed methods for reminding her of these doses to improve compliance, including setting alarms on her phone x2 per day at dosing times.  Courtney [name] was agreeable to improving compliance, stating "I don't want to sabotage my mental health".  She reported that she has been trying not to use marijuana as often, averaging 4-5 times per week.  Clinician inquired about whether Courtney [name] has had any significant changes in thoughts, feelings, or behavior since last check-in.  Courtney [name] reported scores of 0/10 for depression, 1/10 for anxiety, 0/10 for anger/irritability and 0/10 for mania.  Courtney [name] denied experiencing any panic attacks, but disclosed that she did have a 'breakdown' last night where she felt overwhelmed and cried for some time.  Clinician inquired about what triggered this breakdown, and how Courtney [name] attempted to cope.  She reported that she has been dealing with increase of intrusive thoughts over past week, stating "They were very heavy the other day".  Clinician discussed topic of intrusive thoughts with Courtney [name] today, including techniques for gaining control over these thoughts to reduce negative impact upon mood/outlook.  These techniques included utilizing controlled breathing for emotional regulation, substituting negative thoughts for positive ones, redirecting attention away from intrusive thoughts via practice of grounding techniques, and reflecting on triggers which  influence these episodes in order to gain insight  and take steps to reduce exposure.  Intervention was effective, as evidenced by Courtney [name] actively engaging in discussion on the subject, and practicing some of these techniques during session, stating "I need to practice my breathing more often when I get worked up, and try to understand why I'm feeling the way I'm feeling.  If I catch myself feeling some kind of way, I'm going to try to shift my thoughts onto more positive things".  She also reported greater insight into triggers influencing these thoughts and need to manage exposure to dating sites since these can increase exposure to negative people.  Clinician will continue to monitor.        Plan: Meet again in 1 week virtually.   Diagnosis: Bipolar I Disorder, mixed, moderate; Borderline personality disorder; PTSD; Cannabis Use Disorder, moderate; and ADHD, combined type.   [name], LCSW, LCAS 02/07/21

## 2021-02-14 ENCOUNTER — Ambulatory Visit (HOSPITAL_COMMUNITY): Payer: No Typology Code available for payment source | Admitting: Licensed Clinical Social Worker

## 2021-02-14 ENCOUNTER — Ambulatory Visit (INDEPENDENT_AMBULATORY_CARE_PROVIDER_SITE_OTHER): Payer: No Typology Code available for payment source | Admitting: Licensed Clinical Social Worker

## 2021-02-14 ENCOUNTER — Other Ambulatory Visit: Payer: Self-pay

## 2021-02-14 DIAGNOSIS — F3162 Bipolar disorder, current episode mixed, moderate: Secondary | ICD-10-CM

## 2021-02-14 DIAGNOSIS — F4310 Post-traumatic stress disorder, unspecified: Secondary | ICD-10-CM

## 2021-02-14 DIAGNOSIS — F603 Borderline personality disorder: Secondary | ICD-10-CM

## 2021-02-14 DIAGNOSIS — F902 Attention-deficit hyperactivity disorder, combined type: Secondary | ICD-10-CM

## 2021-02-14 DIAGNOSIS — F1220 Cannabis dependence, uncomplicated: Secondary | ICD-10-CM

## 2021-02-14 NOTE — Progress Notes (Signed)
Virtual Visit via Video Note   I connected with Courtney [name] on 02/14/21 at 11:00am by video enabled telemedicine application and verified that I am speaking with the correct person using two identifiers.   I discussed the limitations, risks, security and privacy concerns of performing an evaluation and management service by video and the availability of in person appointments. I also discussed with the patient that there may be a patient responsible charge related to this service. The patient expressed understanding and agreed to proceed.   I discussed the assessment and treatment plan with the patient. The patient was provided an opportunity to ask questions and all were answered. The patient agreed with the plan and demonstrated an understanding of the instructions.   The patient was advised to call back or seek an in-person evaluation if the symptoms worsen or if the condition fails to improve as anticipated.   I provided 30 minutes of non-face-to-face time during this encounter.   [name], LCSW, LCAS ________________________ THERAPIST PROGRESS NOTE   Session Time: 11:00am - 11:30am  Location: Patient: Patient Home Provider: [facility]   Participation Level: Active   Behavioral Response: Alert, neatly groomed, casually dressed, euthymic affect/mood   Type of Therapy:  Individual Therapy   Treatment Goals addressed: Depression and Anxiety management; Medication management; Monitoring substance use; Exercise routine    Interventions: CBT, ACT relaxation skills     Summary: Courtney [name] is a 30 year old female that presented for therapy appointment and is diagnosed with Bipolar I Disorder, mixed, moderate; Borderline personality disorder; PTSD; Cannabis Use Disorder, moderate; and ADHD, combined type.        Suicidal/Homicidal: None; without intent or plan   Therapist Response: Clinician met with Courtney [name] for virtual therapy session and assessed for safety, sobriety,  and medication compliance.  Courtney [name] presented for today's appointment on time and was alert, oriented x5, with no evidence or self-report of active SI/HI or A/V H.  Courtney [name] reported that she has returned to compliance with medication and stated "I'm doing a lot better now".  She reported that she has been using marijuana daily.  Clinician inquired about whether Courtney [name] has had any significant changes in thoughts, feelings, or behavior since previous check-in.  Courtney [name] reported scores of 0/10 for depression, 0/10 for anxiety, 0/10 for anger/irritability and 0/10 for mania.  Courtney [name] denied experiencing any panic attacks or outbursts.  Courtney [name] reported that she has been more productive, visiting the gym daily to workout, in addition to working on her jewelry and meditating.  She reported that she will be staying with her parents for 5 days and this will give her an opportunity to sell some of the jewelry she has made, but she worries about being triggered by her family's behavior.  Clinician offered to teach Kashayla an ACT relaxation technique today to aid in managing difficult thoughts, feelings, urges, and sensations that might emerge over the weekend when she visits family.  She was agreeable to this suggestion, so clinician guided Kolleen through process of getting comfortable, achieving relaxing breathing rhythm, and then maintaining this throughout activity.  Clinician invited Dianah to imagine a gently flowing stream in her mind with leaves floating upon it, and when any thoughts, feelings, urges, or sensations arose, good or bad, she would visualize placing them on these passing leaves over course of 10 minutes practice.  Intervention effectiveness could not be measured, as Avon did attempt to participate in activity, but was highly distractible and reported that she could  not focus because she had too many tasks to take care of this morning.  She requested to try exercise another day  when she could concentrate more effectively. Clinician was agreeable to this, and will continue to monitor.        Plan: Meet again in 1 week virtually.   Diagnosis: Bipolar I Disorder, mixed, moderate; Borderline personality disorder; PTSD; Cannabis Use Disorder, moderate; and ADHD, combined type.   [name], LCSW, LCAS 02/14/21

## 2021-02-21 ENCOUNTER — Other Ambulatory Visit: Payer: Self-pay

## 2021-02-21 ENCOUNTER — Ambulatory Visit (INDEPENDENT_AMBULATORY_CARE_PROVIDER_SITE_OTHER): Payer: No Typology Code available for payment source | Admitting: Licensed Clinical Social Worker

## 2021-02-21 DIAGNOSIS — F603 Borderline personality disorder: Secondary | ICD-10-CM | POA: Diagnosis not present

## 2021-02-21 DIAGNOSIS — F4310 Post-traumatic stress disorder, unspecified: Secondary | ICD-10-CM | POA: Diagnosis not present

## 2021-02-21 DIAGNOSIS — F1220 Cannabis dependence, uncomplicated: Secondary | ICD-10-CM

## 2021-02-21 DIAGNOSIS — F3162 Bipolar disorder, current episode mixed, moderate: Secondary | ICD-10-CM

## 2021-02-21 DIAGNOSIS — F902 Attention-deficit hyperactivity disorder, combined type: Secondary | ICD-10-CM

## 2021-02-21 NOTE — Progress Notes (Signed)
Virtual Visit via Video Note   I connected with Courtney [name] on 02/21/21 at 11:00am by video enabled telemedicine application and verified that I am speaking with the correct person using two identifiers.   I discussed the limitations, risks, security and privacy concerns of performing an evaluation and management service by video and the availability of in person appointments. I also discussed with the patient that there may be a patient responsible charge related to this service. The patient expressed understanding and agreed to proceed.   I discussed the assessment and treatment plan with the patient. The patient was provided an opportunity to ask questions and all were answered. The patient agreed with the plan and demonstrated an understanding of the instructions.   The patient was advised to call back or seek an in-person evaluation if the symptoms worsen or if the condition fails to improve as anticipated.   I provided 30 minutes of non-face-to-face time during this encounter.   [name], LCSW, LCAS ________________________ THERAPIST PROGRESS NOTE   Session Time: 11:00am - 11:30am  Location: Patient: Patient Home Provider: [facility]   Participation Level: Active   Behavioral Response: Alert, neatly groomed, casually dressed, irritable affect/mood   Type of Therapy:  Individual Therapy   Treatment Goals addressed: Depression and Anxiety management; Medication management; Monitoring substance use; Improving communication skills    Interventions: CBT, communication skills     Summary: Courtney [name] is a 30 year old female that presented for therapy appointment and is diagnosed with Bipolar I Disorder, mixed, moderate; Borderline personality disorder; PTSD; Cannabis Use Disorder, moderate; and ADHD, combined type.        Suicidal/Homicidal: None; without intent or plan   Therapist Response: Clinician met with Courtney [name] for virtual therapy appointment and assessed  for safety, sobriety, and medication compliance.  Courtney [name] presented for today's session on time and was alert, oriented x5, with no evidence or self-report of active SI/HI or A/V H.  Cherrise reported ongoing compliance with medication, but disclosed that she has been using marijuana every other day.  Clinician inquired about whether Courtney [name] has had any significant changes in thoughts, feelings, or behavior since last check-in.  Alahia reported scores of 2/10 for depression, 2/10 for anxiety, 3/10 for anger/irritability and 0/10 for mania.  Courtney [name] denied experiencing any panic attacks.  She reported that she did go visit her family for 1 week, and almost got into a fight with her brother at one point, in addition to arguing with her step-dad.  Clinician inquired about what triggered this conflict, and how she attempted to cope in order to de-escalate.  Courtney [name] reported that her brother had a disagreement over cleaning duties, and her step-dad was upset about how she responded to the mother at one point.  Courtney [name] reported that she will stand up for herself when necessary, but oftentimes she is unsure why they react in this manner.  Clinician discussed topic of communication skills with Courtney [name] today to assist.  Clinician utilized a handout with Courtney [name] that detailed various communication 'traps' that she might experience when interacting with family, and defined each one, including common examples such as criticism, defensiveness, contempt, stonewalling, overgeneralizing, arguing, and more.  Clinician inquired about which ones Courtney [name] has engaged in, or seen demonstrated by her supports, and discussed strategies for addressing each one successfully to strengthen relationships and communication skills.  Intervention was effective, as evidenced by Courtney [name] actively engaging in discussion on the subject, and reporting that this helped her build insight into  areas where improvements could be made in  existing communication with family, including avoiding contemptuous nonverbal behaviors such a rolling eyes, expressing criticisms, and stonewalling.  Clinician will continue to monitor.        Plan: Meet again in 1 week virtually.   Diagnosis: Bipolar I Disorder, mixed, moderate; Borderline personality disorder; PTSD; Cannabis Use Disorder, moderate; and ADHD, combined type.   [name], LCSW, LCAS 02/21/21

## 2021-02-28 ENCOUNTER — Other Ambulatory Visit: Payer: Self-pay

## 2021-02-28 ENCOUNTER — Ambulatory Visit (INDEPENDENT_AMBULATORY_CARE_PROVIDER_SITE_OTHER): Payer: No Typology Code available for payment source | Admitting: Licensed Clinical Social Worker

## 2021-02-28 DIAGNOSIS — F3162 Bipolar disorder, current episode mixed, moderate: Secondary | ICD-10-CM

## 2021-02-28 DIAGNOSIS — F1220 Cannabis dependence, uncomplicated: Secondary | ICD-10-CM

## 2021-02-28 DIAGNOSIS — F902 Attention-deficit hyperactivity disorder, combined type: Secondary | ICD-10-CM

## 2021-02-28 DIAGNOSIS — F603 Borderline personality disorder: Secondary | ICD-10-CM

## 2021-02-28 DIAGNOSIS — F4310 Post-traumatic stress disorder, unspecified: Secondary | ICD-10-CM | POA: Diagnosis not present

## 2021-02-28 NOTE — Progress Notes (Signed)
Virtual Visit via Video Note   I connected with Courtney [name] on 02/28/21 at 11:00am by video enabled telemedicine application and verified that I am speaking with the correct person using two identifiers.   I discussed the limitations, risks, security and privacy concerns of performing an evaluation and management service by video and the availability of in person appointments. I also discussed with the patient that there may be a patient responsible charge related to this service. The patient expressed understanding and agreed to proceed.   I discussed the assessment and treatment plan with the patient. The patient was provided an opportunity to ask questions and all were answered. The patient agreed with the plan and demonstrated an understanding of the instructions.   The patient was advised to call back or seek an in-person evaluation if the symptoms worsen or if the condition fails to improve as anticipated.   I provided 30 minutes of non-face-to-face time during this encounter.   [name], LCSW, LCAS ________________________ THERAPIST PROGRESS NOTE   Session Time: 11:00am - 11:30am    Location: Patient: Patient Home Provider: [facility] [place] [facility]   Participation Level: Active   Behavioral Response: Alert, neatly groomed, casually dressed, euthymic affect/mood     Type of Therapy:  Individual Therapy   Treatment Goals addressed: Depression and Anxiety management; Medication management; Monitoring substance use  Interventions: CBT, ACT relaxation skill   Summary: Courtney [name] is a 30 year old female that presented for therapy appointment and is diagnosed with Bipolar I Disorder, mixed, moderate; Borderline personality disorder; PTSD; Cannabis Use Disorder, moderate; and ADHD, combined type.        Suicidal/Homicidal: None; without intent or plan   Therapist Response: Clinician met with [name] for virtual therapy session and assessed for safety, sobriety, and medication  compliance.  Doha presented for today's appointment on time and was alert, oriented x5, with no evidence or self-report of active SI/HI or A/V H.  [name] reported ongoing compliance with medication, but disclosed that she has been using marijuana every other day.  Clinician inquired about whether [name] has had any significant changes in thoughts, feelings, or behavior since previous check-in.  [name] reported scores of 0/10 for depression, 0/10 for anxiety, 0/10 for anger/irritability and 0/10 for mania.  [name] denied experiencing any panic attacks.  She reported that she has been in a positive mood lately, visiting the gym daily to workout, creating jewelry, meditating, and has a date later today.  [name] reported that although she had planned not to seek a relationship for some time, she has been mindful of red flags in potential partners, and feels that this person is a safe option.  She reported that she was feeling some anxiety related to upcoming date, and this has been distracting.  Clinician suggested revisiting previous ACT technique of leaf meditation with [name] to assist in reducing these anxious thoughts and feelings, which she was agreeable to, since she reported she would not be exposed to distractions this time.  Clinician guided her through process of getting comfortable, achieving relaxing breathing rhythm, and then maintaining this throughout activity.  Clinician invited [name] to imagine a gently flowing stream in her mind with leaves floating upon it, and when any thoughts, feelings, urges, or sensations arose, good or bad, she would visualize placing them on these passing leaves over course of 10 minutes practice.  Intervention was effective, as evidenced by [name] actively participating in activity successfully and reporting that she was able to focus on clinician's  directions this time, and felt more relaxed and grounded as a result.  She reported that she would  add this to self-care routine for practice, and follow up next week.  Clinician will continue to monitor.        Plan: Meet again in 1 week virtually.   Diagnosis: Bipolar I Disorder, mixed, moderate; Borderline personality disorder; PTSD; Cannabis Use Disorder, moderate; and ADHD, combined type.   [name], LCSW, LCAS 02/28/21

## 2021-03-07 ENCOUNTER — Ambulatory Visit (INDEPENDENT_AMBULATORY_CARE_PROVIDER_SITE_OTHER): Payer: No Typology Code available for payment source | Admitting: Licensed Clinical Social Worker

## 2021-03-07 ENCOUNTER — Other Ambulatory Visit: Payer: Self-pay

## 2021-03-07 DIAGNOSIS — F1220 Cannabis dependence, uncomplicated: Secondary | ICD-10-CM | POA: Diagnosis not present

## 2021-03-07 DIAGNOSIS — F4310 Post-traumatic stress disorder, unspecified: Secondary | ICD-10-CM | POA: Diagnosis not present

## 2021-03-07 DIAGNOSIS — F3162 Bipolar disorder, current episode mixed, moderate: Secondary | ICD-10-CM

## 2021-03-07 DIAGNOSIS — F603 Borderline personality disorder: Secondary | ICD-10-CM

## 2021-03-07 DIAGNOSIS — F902 Attention-deficit hyperactivity disorder, combined type: Secondary | ICD-10-CM

## 2021-03-07 NOTE — Progress Notes (Signed)
Virtual Visit via Video Note   I connected with Courtney [name] on 03/07/21 at 11:00am by video enabled telemedicine application and verified that I am speaking with the correct person using two identifiers.   I discussed the limitations, risks, security and privacy concerns of performing an evaluation and management service by video and the availability of in person appointments. I also discussed with the patient that there may be a patient responsible charge related to this service. The patient expressed understanding and agreed to proceed.   I discussed the assessment and treatment plan with the patient. The patient was provided an opportunity to ask questions and all were answered. The patient agreed with the plan and demonstrated an understanding of the instructions.   The patient was advised to call back or seek an in-person evaluation if the symptoms worsen or if the condition fails to improve as anticipated.   I provided 35 minutes of non-face-to-face time during this encounter.   [name], LCSW, LCAS ________________________ THERAPIST PROGRESS NOTE   Session Time: 11:00am - 11:35am   Location: Patient: Patient Home Provider: [facility] [place] [facility]   Participation Level: Active   Behavioral Response: Alert, neatly groomed, casually dressed, depressed affect/mood     Type of Therapy:  Individual Therapy   Treatment Goals addressed: Depression and Anxiety management; Medication management; Monitoring substance use; Improving communication skills    Interventions: CBT, communication and conflict resolution skills   Summary: Courtney [name] is a 30 year old female that presented for therapy appointment and is diagnosed with Bipolar I Disorder, mixed, moderate; Borderline personality disorder; PTSD; Cannabis Use Disorder, moderate; and ADHD, combined type.        Suicidal/Homicidal: None; without intent or plan   Therapist Response: Clinician met with [name] for virtual therapy  appointment and assessed for safety, sobriety, and medication compliance.  [name] presented for today's session on time and was alert, oriented x5, with no evidence or self-report of active SI/HI or A/V H.  [name] reported that she continues taking medication as prescribed, but disclosed that she continues using marijuana every other day.  [name] also reported using a 'small amount' of DMT over the weekend with a friend, but denied any adverse reaction or side effects.  Clinician reminded [name] of potential side effects that could result from use of this substance, and encouraged her to abstain from future use.  Clinician inquired about whether [name] has had any significant changes in thoughts, feelings, or behavior since last check-in.  [name] reported scores of 2/10 for depression, 0/10 for anxiety, 0/10 for anger/irritability and 0/10 for mania.  [name] denied experiencing any panic attacks.  She reported that her main issue at this time is getting along with her roommates, as they have had several arguments recently, which has increased tension in the household.  Clinician discussed topic of communication styles (i.e. passive, aggressive, assertive) with [name] again in order to identify areas of improvement that could be made to her approach.  Clinician also reviewed conflict resolution skills that [name] could utilize to resolve differences with her roommates, including examples such as reflective listening, "I" statements, time outs, and seeking compromise when possible.  Clinician also provided roleplay scenarios for [name] to practice assertive responses in order to resolve conflict more effectively in the future.  Intervention was effective, as evidenced by [name] actively engaging in conversation on subject and participating in roleplay exercises, reporting that her responses to conflict with roommates recently were likely too aggressive and increased tension due to  triggered trauma response, so she will try to be more mindful of the role she plays in de-escalating conflict.  [name] stated "I've got to pay attention to how I react and think of the consequence.  I don't want to keep having problems with people I live with". Clinician will continue to monitor.        Plan: Meet again in 1 week virtually.   Diagnosis: Bipolar I Disorder, mixed, moderate; Borderline personality disorder; PTSD; Cannabis Use Disorder, moderate; and ADHD, combined type.   [name], LCSW, LCAS 03/07/21

## 2021-03-14 ENCOUNTER — Other Ambulatory Visit: Payer: Self-pay

## 2021-03-14 ENCOUNTER — Ambulatory Visit (INDEPENDENT_AMBULATORY_CARE_PROVIDER_SITE_OTHER): Payer: No Typology Code available for payment source | Admitting: Licensed Clinical Social Worker

## 2021-03-14 DIAGNOSIS — F3162 Bipolar disorder, current episode mixed, moderate: Secondary | ICD-10-CM | POA: Diagnosis not present

## 2021-03-14 DIAGNOSIS — F1220 Cannabis dependence, uncomplicated: Secondary | ICD-10-CM

## 2021-03-14 DIAGNOSIS — F4310 Post-traumatic stress disorder, unspecified: Secondary | ICD-10-CM

## 2021-03-14 DIAGNOSIS — F902 Attention-deficit hyperactivity disorder, combined type: Secondary | ICD-10-CM

## 2021-03-14 DIAGNOSIS — F603 Borderline personality disorder: Secondary | ICD-10-CM | POA: Diagnosis not present

## 2021-03-14 NOTE — Progress Notes (Signed)
Virtual Visit via Video Note   I connected with Courtney [name] on 03/14/21 at 11:00am by video enabled telemedicine application and verified that I am speaking with the correct person using two identifiers.   I discussed the limitations, risks, security and privacy concerns of performing an evaluation and management service by video and the availability of in person appointments. I also discussed with the patient that there may be a patient responsible charge related to this service. The patient expressed understanding and agreed to proceed.   I discussed the assessment and treatment plan with the patient. The patient was provided an opportunity to ask questions and all were answered. The patient agreed with the plan and demonstrated an understanding of the instructions.   The patient was advised to call back or seek an in-person evaluation if the symptoms worsen or if the condition fails to improve as anticipated.   I provided 1 hour of non-face-to-face time during this encounter.   [name], LCSW, LCAS ________________________ THERAPIST PROGRESS NOTE   Session Time: 11:00am - 12:00pm   Location: Patient: Patient Home Provider: [facility] [place] [facility]   Participation Level: Active   Behavioral Response: Alert, neatly groomed, casually dressed, anxious affect/mood   Type of Therapy:  Individual Therapy   Treatment Goals addressed: Depression and Anxiety management; Medication management; Monitoring substance use   Interventions: CBT, psychoeducation on MDMA side effects, and building a sober support network    Summary: Courtney [name] is a 30 year old female that presented for therapy appointment and is diagnosed with Bipolar I Disorder, mixed, moderate; Borderline personality disorder; PTSD; Cannabis Use Disorder, moderate; and ADHD, combined type.        Suicidal/Homicidal: None; without intent or plan   Therapist Response: Clinician met with [name] for virtual therapy session and  assessed for safety, sobriety, and medication compliance.  Courtney [name] presented for today's appointment on time and was alert, oriented x5, with no evidence or self-report of active SI/HI or A/V H.  Pamela reported ongoing compliance with medication.  Courtney [name] denied any use of alcohol.  Clinician inquired about whether Courtney [name] has had any significant changes in thoughts, feelings, or behavior since previous check-in.  Courtney [name] reported scores of 2/10 for depression, 6/10 for anxiety, 6/10 for anger/irritability and 4/10 for mania.  Courtney [name] denied experiencing any panic attacks or outbursts.  She initially reported that she is uncertain why she is so anxious today, but then disclosed that she used an unknown amount of MDMA with a friend over the past week.  Clinician warned Ceciley against using illicit substances, and provided psychoeducation on the various side effects that this substance can lead to in order to promote abstinence.  Some of these side effects included interference with medications that influence brain chemicals such as dopamine and serotonin, various physical health effects such as inability to regulate body temperature, nausea, muscle cramping, and longer term effects following cessation such as irritability, depression, anxiety, memory/attention issues, and loss of pleasure from engaging in normal activities.  Clinician also encouraged Courtney [name] to make changes to her support network in order to promote abstinence efforts, including reducing contact with people that would offer or increase exposure to illicit substances like this.  Intervention was effective, as evidenced by [name] reporting greater awareness into negative consequences of illicit substance use, including impact upon her mental health, noting that she tends to feel more emotionally stable during periods of abstinence, with less impulsivity as well.  Courtney [name] stated "I need to just focus on my  self-care right now by  making music, jewelry, and meditating each day.  I also need to network more with people that will help me grow as a person and stop doing drugs".  Clinician will continue to monitor.        Plan: Meet again in 1 week virtually.   Diagnosis: Bipolar I Disorder, mixed, moderate; Borderline personality disorder; PTSD; Cannabis Use Disorder, moderate; and ADHD, combined type.   [name], LCSW, LCAS 03/14/21

## 2021-03-21 ENCOUNTER — Ambulatory Visit (INDEPENDENT_AMBULATORY_CARE_PROVIDER_SITE_OTHER): Payer: No Typology Code available for payment source | Admitting: Licensed Clinical Social Worker

## 2021-03-21 ENCOUNTER — Other Ambulatory Visit: Payer: Self-pay

## 2021-03-21 DIAGNOSIS — F603 Borderline personality disorder: Secondary | ICD-10-CM | POA: Diagnosis not present

## 2021-03-21 DIAGNOSIS — F1220 Cannabis dependence, uncomplicated: Secondary | ICD-10-CM | POA: Diagnosis not present

## 2021-03-21 DIAGNOSIS — F4310 Post-traumatic stress disorder, unspecified: Secondary | ICD-10-CM

## 2021-03-21 DIAGNOSIS — F3162 Bipolar disorder, current episode mixed, moderate: Secondary | ICD-10-CM | POA: Diagnosis not present

## 2021-03-21 DIAGNOSIS — F902 Attention-deficit hyperactivity disorder, combined type: Secondary | ICD-10-CM

## 2021-03-21 NOTE — Progress Notes (Signed)
Virtual Visit via Video Note   I connected with Marvelle Span on 03/21/21 at 3:00pm by video enabled telemedicine application and verified that I am speaking with the correct person using two identifiers.   I discussed the limitations, risks, security and privacy concerns of performing an evaluation and management service by video and the availability of in person appointments. I also discussed with the patient that there may be a patient responsible charge related to this service. The patient expressed understanding and agreed to proceed.   I discussed the assessment and treatment plan with the patient. The patient was provided an opportunity to ask questions and all were answered. The patient agreed with the plan and demonstrated an understanding of the instructions.   The patient was advised to call back or seek an in-person evaluation if the symptoms worsen or if the condition fails to improve as anticipated.   I provided 1 hour of non-face-to-face time during this encounter.   [name], LCSW, LCAS ________________________ THERAPIST PROGRESS NOTE   Session Time: 3:00pm - 4:00pm   Location: Patient: Patient Home Provider: [facility] [place] [facility]   Participation Level: Active   Behavioral Response: Alert, neatly groomed, casually dressed, anxious affect/mood   Type of Therapy:  Individual Therapy   Treatment Goals addressed: Depression and Anxiety management; Medication management; Monitoring substance use   Interventions: CBT, psychoeducation on ketamine side effects, relapse prevention   Summary: Adilee Lemme is a 30 year old female that presented for therapy appointment and is diagnosed with Bipolar I Disorder, mixed, moderate; Borderline personality disorder; PTSD; Cannabis Use Disorder, moderate; and ADHD, combined type.        Suicidal/Homicidal: None; without intent or plan   Therapist Response: Clinician met with [name] for virtual therapy appointment and assessed for  safety, sobriety, and medication compliance.  Kurstin presented for today's session on time and was alert, oriented x5, with no evidence or self-report of active SI/HI or A/V H.  Shadoe reported that she continues taking medication as prescribed and denied any use of alcohol or marijuana.  Clinician inquired about whether Marcayla has had any significant changes in thoughts, feelings, or behavior since last check-in.  Mariadejesus reported scores of 1/10 for depression, 3/10 for anxiety, 0/10 for anger/irritability and 0/10 for mania.  Hazell denied experiencing any panic attacks or outbursts.  Gracilyn disclosed that although she has tried to stay more active by visiting the gym several times per week, she did end up using Ketamine offered from a friend x3 in one night while socializing.  Clinician warned Zetha against using illicit substances like this due to risks, and provided psychoeducation on the various side effects that ketamine can cause in order to encourage abstinence.  Some of the side effects mentioned included psychological (i.e. hallucinations, confusion, dissociation), physical (slowed breathing, immobility, trouble speaking) and more extreme consequences such as heart problems, psychosis, brain damage, and seizures.  Clinician reminded Wilsie of importance in making changes to her support system to support sobriety, and practicing her refusal skills in order to avoid justifying future experimentation with harmful substances like this.  Intervention was effective, as evidenced by [name] engaging in discussion on the subject and reporting that she was unaware of these potential side effects, so she will be less likely to try this substance again if offered.  Kasondra stated "I should probably do more research before I try things like this.  I'm a very daring person and need to stay away from it because I already have  preexisting conditions it could mess with".  Clinician will  continue to monitor.        Plan: Meet again in 1 week virtually.   Diagnosis: Bipolar I Disorder, mixed, moderate; Borderline personality disorder; PTSD; Cannabis Use Disorder, moderate; and ADHD, combined type.   [name], LCSW, LCAS 03/21/21

## 2021-03-28 ENCOUNTER — Ambulatory Visit (INDEPENDENT_AMBULATORY_CARE_PROVIDER_SITE_OTHER): Payer: No Typology Code available for payment source | Admitting: Licensed Clinical Social Worker

## 2021-03-28 ENCOUNTER — Other Ambulatory Visit: Payer: Self-pay

## 2021-03-28 DIAGNOSIS — F1220 Cannabis dependence, uncomplicated: Secondary | ICD-10-CM | POA: Diagnosis not present

## 2021-03-28 DIAGNOSIS — F3162 Bipolar disorder, current episode mixed, moderate: Secondary | ICD-10-CM | POA: Diagnosis not present

## 2021-03-28 DIAGNOSIS — F4310 Post-traumatic stress disorder, unspecified: Secondary | ICD-10-CM | POA: Diagnosis not present

## 2021-03-28 DIAGNOSIS — F603 Borderline personality disorder: Secondary | ICD-10-CM

## 2021-03-28 DIAGNOSIS — F902 Attention-deficit hyperactivity disorder, combined type: Secondary | ICD-10-CM

## 2021-03-28 NOTE — Progress Notes (Signed)
Virtual Visit via Video Note   I connected with Courtney [name] on 03/28/21 at 11:00am by video enabled telemedicine application and verified that I am speaking with the correct person using two identifiers.   I discussed the limitations, risks, security and privacy concerns of performing an evaluation and management service by video and the availability of in person appointments. I also discussed with the patient that there may be a patient responsible charge related to this service. The patient expressed understanding and agreed to proceed.   I discussed the assessment and treatment plan with the patient. The patient was provided an opportunity to ask questions and all were answered. The patient agreed with the plan and demonstrated an understanding of the instructions.   The patient was advised to call back or seek an in-person evaluation if the symptoms worsen or if the condition fails to improve as anticipated.   I provided 30 minutes of non-face-to-face time during this encounter.   [name], LCSW, LCAS ________________________ THERAPIST PROGRESS NOTE   Session Time: 11:00am - 11:30am    Location: Patient: Patient Home Provider: [facility] [place] [facility]   Participation Level: Active   Behavioral Response: Alert, casually dressed, euthymic affect/mood   Type of Therapy:  Individual Therapy   Treatment Goals addressed: Depression and Anxiety management; Medication management; Monitoring substance use; Exercise Routine    Interventions: CBT, guided imagery    Summary: Courtney [name] is a 30 year old female that presented for therapy appointment and is diagnosed with Bipolar I Disorder, mixed, moderate; Borderline personality disorder; PTSD; Cannabis Use Disorder, moderate; and ADHD, combined type.        Suicidal/Homicidal: None; without intent or plan   Therapist Response: Clinician met with Courtney [name] for virtual therapy session and assessed for safety, sobriety, and medication  compliance.  Courtney [name] presented for today's appointment on time and was alert, oriented x5, with no evidence or self-report of active SI/HI or A/V H.  Argie reported ongoing compliance with medication and denied any use of alcohol.  She reported that she used marijuana 3-4x over past week, stating "Its sporadic".  Clinician encouraged her to continue cutting back on substance use, and inquired about whether Courtney [name] has had any significant changes in thoughts, feelings, or behavior since previous check-in.  Courtney [name] reported scores of 0/10 for depression, 1/10 for anxiety, 1/10 for anger/irritability and 0/10 for mania.  Courtney [name] denied experiencing any panic attacks or outbursts.  Courtney [name] reported that she has continued going to the gym, and trying to improve self-care each day.  She reported that issues remain with her roommate, but she tries to remain civil and avoid conflict in the apartment, stating "She is trying to get a reaction out of me, but I just have to keep calm and show that her that I'm not going anywhere".  Clinician suggested practice of relaxation skills today with Courtney [name] that could be utilized to calm down when exposed to triggers.  Courtney [name] was agreeable to this suggestion, so clinician invited her to participate in peaceful place guided imagery activity and explained how this is a powerful visualization tool which can aid in reducing stress while increasing sense of calm, control, and awareness if practiced regularly.  Clinician informed Courtney [name] beforehand that if she became uncomfortable at any point during activity, she could stop and open her eyes.  Clinician invited her to get comfortable, achieve a relaxing breathing rhythm, close her eyes, and then guided her through process of creating a 'peaceful place' which filled her with  safety and calm.  Clinician encouraged Courtney [name] to include sensory details involving vision, sound, touch, smell, and taste which she considered  pleasant to enhance experience.  After 10 minutes of practice in session, clinician invited Courtney [name] to share her opinion on the activity, including whether she was able to imagine a specific place, what details stood out to her, and how this made her feel during and after.  Intervention was effective, as evidenced by Courtney [name] participating in activity successfully and reporting that she had trouble initially with focus due to her ADHD, but was eventually able to visualize being in her bedroom with her cat, which was easier to imagine, and still filled her with a sense of calm.  Courtney [name] reported that she has a guided imagery application on her phone that may offer similar experience, so she will plan to practice this as part of self-care routine in order to maintain positive mood and reduce reactivity when triggered.  Clinician will continue to monitor.        Plan: Meet again in 1 week virtually.   Diagnosis: Bipolar I Disorder, mixed, moderate; Borderline personality disorder; PTSD; Cannabis Use Disorder, moderate; and ADHD, combined type.   [name], LCSW, LCAS 03/28/21

## 2021-04-04 ENCOUNTER — Ambulatory Visit (INDEPENDENT_AMBULATORY_CARE_PROVIDER_SITE_OTHER): Payer: No Typology Code available for payment source | Admitting: Licensed Clinical Social Worker

## 2021-04-04 ENCOUNTER — Other Ambulatory Visit: Payer: Self-pay

## 2021-04-04 DIAGNOSIS — F902 Attention-deficit hyperactivity disorder, combined type: Secondary | ICD-10-CM

## 2021-04-04 DIAGNOSIS — F603 Borderline personality disorder: Secondary | ICD-10-CM | POA: Diagnosis not present

## 2021-04-04 DIAGNOSIS — F3162 Bipolar disorder, current episode mixed, moderate: Secondary | ICD-10-CM | POA: Diagnosis not present

## 2021-04-04 DIAGNOSIS — F1220 Cannabis dependence, uncomplicated: Secondary | ICD-10-CM | POA: Diagnosis not present

## 2021-04-04 DIAGNOSIS — F4310 Post-traumatic stress disorder, unspecified: Secondary | ICD-10-CM | POA: Diagnosis not present

## 2021-04-04 NOTE — Progress Notes (Signed)
Virtual Visit via Video Note   I connected with Courtney [name] on 04/04/21 at 11:00am by video enabled telemedicine application and verified that I am speaking with the correct person using two identifiers.   I discussed the limitations, risks, security and privacy concerns of performing an evaluation and management service by video and the availability of in person appointments. I also discussed with the patient that there may be a patient responsible charge related to this service. The patient expressed understanding and agreed to proceed.   I discussed the assessment and treatment plan with the patient. The patient was provided an opportunity to ask questions and all were answered. The patient agreed with the plan and demonstrated an understanding of the instructions.   The patient was advised to call back or seek an in-person evaluation if the symptoms worsen or if the condition fails to improve as anticipated.   I provided 34 minutes of non-face-to-face time during this encounter.   [name], LCSW, LCAS ________________________ THERAPIST PROGRESS NOTE   Session Time: 11:00am - 11:34am    Location: Patient: Patient Home Provider: [facility]   Participation Level: Active   Behavioral Response: Alert, casually dressed, euthymic affect/mood   Type of Therapy:  Individual Therapy   Treatment Goals addressed: Depression and Anxiety management; Medication management; Monitoring substance use; Following a zero balanced budget    Interventions: CBT, problem solving     Summary: Courtney [name] is a 30 year old female that presented for therapy appointment and is diagnosed with Bipolar I Disorder, mixed, moderate; Borderline personality disorder; PTSD; Cannabis Use Disorder, moderate; and ADHD, combined type.        Suicidal/Homicidal: None; without intent or plan   Therapist Response: Clinician met with Courtney [name] for virtual therapy appointment and assessed for safety,  sobriety, and medication compliance.  Courtney [name] presented for today's session on time and was alert, oriented x5, with no evidence or self-report of active SI/HI or A/V H.  Courtney [name] reported that she continues taking medication as prescribed and denied any use of alcohol.  She reported that she used marijuana 1-2x over last week.  Clinician encouraged her to continue cutting back on substance use.  Clinician inquired about Courtney [name]'s current emotional ratings, and whether she has had any significant changes in thoughts, feelings, or behavior since last check-in.  Courtney [name] reported scores of 0/10 for depression, 0/10 for anxiety, 0/10 for anger/irritability and 0/10 for mania.  Courtney [name] reported that she experienced one significant panic attack on Monday, stating "My landlord told me I'm going to have to move".  Courtney [name] reported that the landlord made this decision based upon problems with her that were being reported by the roommates, but she was not given specifics.  Courtney [name] reported that she went to her room and started crying after learning this news, but after an hour she began to focus on finding solutions for other places to live and ended up speaking with a friend, who linked her with a mutual friend that was looking for a renter at a reasonable rate.  Courtney [name] reported that she plans to move in with this person tomorrow.  Clinician assisted Courtney [name] in running a cost benefit analysis regarding whether this will be a transition which supports her overall mental health and stability.  Courtney [name] engaged in analysis and reported that this transition does offer several benefits, including more space for herself, no interactions with roommates, close proximity to a gym, space for her cat, and less triggers.  She reported that  one downside is increased cost, and disclosed that she has not been following a budget for several months.  Clinician virtually shared a handout today from the [facility] with Courtney [name]  that explained how to establish a well balanced budget.  This handout's purpose was to help Courtney [name] increase insight into exactly how much money she spends each month based upon various categories such as housing, food, transportation, health, personal/family, finances, and misc.  A calculator was also featured in order to determine the difference between her total monthly income versus expenses, and Courtney [name] was encouraged to identify which specific areas (i.e. fast food, bills, gas, etc) she could cut costs down in to avoid increasing debt as she transitions to new living arrangement.  Intervention was effective, as evidenced by Courtney [name] actively engaging in discussion on subject, and reporting that this could be helpful for tracking unneeded expenses which are cutting into monthly income.  Courtney [name] stated "I want to use it.  I like how detailed it is and think it will help me see exactly where my money is going.  I could start by cut back on buying crystals and gemstones all the time".  Clinician emailed her a link with this form to begin monitoring expenses, and will continue to monitor.        Plan: Meet again in 1 week virtually.   Diagnosis: Bipolar I Disorder, mixed, moderate; Borderline personality disorder; PTSD; Cannabis Use Disorder, moderate; and ADHD, combined type.   [name], LCSW, LCAS 04/04/21

## 2021-04-11 ENCOUNTER — Ambulatory Visit (HOSPITAL_COMMUNITY): Payer: No Typology Code available for payment source | Admitting: Licensed Clinical Social Worker

## 2021-04-18 ENCOUNTER — Ambulatory Visit (HOSPITAL_COMMUNITY): Payer: No Typology Code available for payment source | Admitting: Licensed Clinical Social Worker

## 2021-04-19 ENCOUNTER — Ambulatory Visit (HOSPITAL_COMMUNITY): Payer: No Typology Code available for payment source | Admitting: Licensed Clinical Social Worker

## 2021-04-26 ENCOUNTER — Other Ambulatory Visit: Payer: Self-pay

## 2021-04-26 ENCOUNTER — Ambulatory Visit (INDEPENDENT_AMBULATORY_CARE_PROVIDER_SITE_OTHER): Payer: No Typology Code available for payment source | Admitting: Licensed Clinical Social Worker

## 2021-04-26 DIAGNOSIS — F1220 Cannabis dependence, uncomplicated: Secondary | ICD-10-CM | POA: Diagnosis not present

## 2021-04-26 DIAGNOSIS — F603 Borderline personality disorder: Secondary | ICD-10-CM

## 2021-04-26 DIAGNOSIS — F4310 Post-traumatic stress disorder, unspecified: Secondary | ICD-10-CM | POA: Diagnosis not present

## 2021-04-26 DIAGNOSIS — F3162 Bipolar disorder, current episode mixed, moderate: Secondary | ICD-10-CM

## 2021-04-26 DIAGNOSIS — F902 Attention-deficit hyperactivity disorder, combined type: Secondary | ICD-10-CM

## 2021-04-26 NOTE — Progress Notes (Signed)
Virtual Visit via Telephone Note   I connected with Courtney [name] on 04/26/21 at 11:00am by telephone and verified that I am speaking with the correct person using two identifiers.   I discussed the limitations, risks, security and privacy concerns of performing an evaluation and management service by telephone and the availability of in person appointments. I also discussed with the patient that there may be a patient responsible charge related to this service. The patient expressed understanding and agreed to proceed.   I discussed the assessment and treatment plan with the patient. The patient was provided an opportunity to ask questions and all were answered. The patient agreed with the plan and demonstrated an understanding of the instructions.   The patient was advised to call back or seek an in-person evaluation if the symptoms worsen or if the condition fails to improve as anticipated.   I provided 1 hour of non-face-to-face time during this encounter.   [name], LCSW, LCAS ________________________ THERAPIST PROGRESS NOTE   Session Time: 11:00am - 12:00pm   Location: Patient: Patient Home Provider: [facility]   Participation Level: Active   Behavioral Response: Alert, euthymic mood   Type of Therapy:  Individual Therapy   Treatment Goals addressed: Depression and Anxiety management; Medication management; Monitoring substance use; Consistently writing in journal   Interventions: CBT, gratitude journaling   Summary: Courtney [name] is a 30 year old female that presented for therapy appointment and is diagnosed with Bipolar I Disorder, mixed, moderate; Borderline personality disorder; PTSD; Cannabis Use Disorder, moderate; and ADHD, combined type.        Suicidal/Homicidal: None; without intent or plan   Therapist Response: Clinician spoke with Courtney [name] for therapy session via telephone as requested due to her phone's issue with overheating when using video.   Clinician assessed for safety, sobriety, and medication compliance.  Courtney [name] answered phone call and spoke in a manner that was alert, oriented x5, with no evidence or self-report of active SI/HI or A/V H.  Courtney [name] reported ongoing compliance with medication and denied any use of alcohol.  Courtney [name] reported that she has been trying to cut back on marijuana use, averaging 3x per week.  Clinician inquired about Courtney [name]'s emotional ratings today, and whether she has had any significant changes in thoughts, feelings, or behavior since previous check-in.  Courtney [name] reported scores of 1/10 for depression, 3/10 for anxiety, 0/10 for anger/irritability and 0/10 for mania.  Courtney [name] reported that she successfully moved into a new home, and stated "The people are awesome, I feel safe emotionally and spiritually, and have place for my cat".  Courtney [name] reported that one recent challenge was experiencing one panic attack last week and considered going to the hospital, stating "I wasn't suicidal, but I did feel very alone and was crying.  I don't know what triggered it".  Courtney [name] reported that she recently got a new journal, and has been wanting to get back into writing regularly, but doesn't know what to write about.  Clinician revisited topic of gratitude journaling with Courtney [name] today to assist with this issue.  Clinician discussed the benefits this activity offers to one's mental health if practiced regularly, including increased happiness/mood, greater life satisfaction, and resiliency.  Clinician provided Courtney [name] with several journal prompts to write about aloud in session today, including examples such as "What was your favorite moment this week so far?", "List 5 things you like about yourself", and "What is one of your greatest life achievements so far?".  Intervention was effective,  as evidenced by Courtney [name] participating in exercise successfully, and answering all gratitude questions appropriately, which  led to productive discussion upon her personal strengths, supports, and accomplishments.  Courtney [name] stated "This was really helpful.  I got 2 pages done, and want to do it every day now.  Its good to sit back and think about what I'm grateful for because it will keep me from thinking negatively".  Clinician will continue to monitor.        Plan: Meet again in 1 week virtually.   Diagnosis: Bipolar I Disorder, mixed, moderate; Borderline personality disorder; PTSD; Cannabis Use Disorder, moderate; and ADHD, combined type.   [name], [place], LCAS 04/26/21

## 2021-05-02 ENCOUNTER — Ambulatory Visit (HOSPITAL_COMMUNITY): Payer: No Typology Code available for payment source | Admitting: Licensed Clinical Social Worker

## 2021-05-09 ENCOUNTER — Ambulatory Visit (INDEPENDENT_AMBULATORY_CARE_PROVIDER_SITE_OTHER): Payer: No Typology Code available for payment source | Admitting: Licensed Clinical Social Worker

## 2021-05-09 ENCOUNTER — Other Ambulatory Visit: Payer: Self-pay

## 2021-05-09 DIAGNOSIS — F4310 Post-traumatic stress disorder, unspecified: Secondary | ICD-10-CM

## 2021-05-09 DIAGNOSIS — F1220 Cannabis dependence, uncomplicated: Secondary | ICD-10-CM | POA: Diagnosis not present

## 2021-05-09 DIAGNOSIS — F603 Borderline personality disorder: Secondary | ICD-10-CM | POA: Diagnosis not present

## 2021-05-09 DIAGNOSIS — F902 Attention-deficit hyperactivity disorder, combined type: Secondary | ICD-10-CM

## 2021-05-09 DIAGNOSIS — F3162 Bipolar disorder, current episode mixed, moderate: Secondary | ICD-10-CM

## 2021-05-09 NOTE — Progress Notes (Signed)
Virtual Visit via Video Note   I connected with [name] Nelligan on 05/09/21 at 11:00am by video enabled telemedicine application and verified that I am speaking with the correct person using two identifiers.   I discussed the limitations, risks, security and privacy concerns of performing an evaluation and management service by video and the availability of in person appointments. I also discussed with the patient that there may be a patient responsible charge related to this service. The patient expressed understanding and agreed to proceed.   I discussed the assessment and treatment plan with the patient. The patient was provided an opportunity to ask questions and all were answered. The patient agreed with the plan and demonstrated an understanding of the instructions.   The patient was advised to call back or seek an in-person evaluation if the symptoms worsen or if the condition fails to improve as anticipated.   I provided 33 minutes of non-face-to-face time during this encounter.   [name], LCSW, LCAS ________________________ THERAPIST PROGRESS NOTE   Session Time: 11:00am - 11:33am    Location: Patient: [facility] Home Provider: [facility]   Participation Level: Active   Behavioral Response: Alert, casually dressed, euthymic mood/affect   Type of Therapy:  Individual Therapy   Treatment Goals addressed: Depression and Anxiety management; Medication management; Monitoring substance use; Consistently writing in journal   Interventions: CBT: combating boredom/behavioral activation, relapse prevention    Summary: Ronnett Pullin is a 30 year old female that presented for therapy appointment and is diagnosed with Bipolar I Disorder, mixed, moderate; Borderline personality disorder; PTSD; Cannabis Use Disorder, moderate; and ADHD, combined type.        Suicidal/Homicidal: None; without intent or plan   Therapist Response: Clinician spoke with [name] for virtual therapy  appointment and assessed for safety, sobriety, and medication compliance.  Avrey answered phone call and spoke in a manner that was alert, oriented x5, with no evidence or self-report of active SI/HI or A/V H.  Meganne reported that she continues taking medication as prescribed and denied any use of alcohol.  Chastelyn reported that she has been smoking marijuana daily, stating "I've just been really bored".  Clinician inquired about Shade's current emotional ratings, and whether she has had any significant changes in thoughts, feelings, or behavior since last check-in.  Lucretia reported scores of 0/10 for depression, 1/10 for anxiety, 0/10 for anger/irritability and 0/10 for mania.  Tuyen denied any panic attacks or outbursts recently.  She reported that one recent challenge was receiving a phone call from her ex-boyfriend and impulsively visiting him in [place], stating "I think I was just lonely".  Olivene reported that she realized this was a bad idea when he displayed some of the same behavior she was used to in the past, such as projection, which led her to return home.  Clinician provided psychoeducation on the importance of addressing day to day boredom due to the negative impact that idle time can have upon one's mental health, particularly when already experiencing depression and using illicit substances.  Clinician discussed healthy activities with Dala that she could include in her daily schedule to reduce frequent feeling of boredom, reliance upon marijuana, and increase overall behavioral activation.  Clinician also reviewed material with Merrin on strategies for building a robust, healthy support network and importance of limiting contact with individuals that display unhealthy behavior (I.e. projection, verbal abuse, etc) such as her ex-boyfriend.  Clinician encouraged Waylynn to maintain a daily planner or calendar in order to  increase likelihood of followthrough with  self-care activities she expressed interest in.  Intervention was effective, as evidenced by [name] actively participating in conversation on this subject, and identifying several healthy self-care activities to include in schedule to reduce boredom such as getting back into an exercise routine, making jewelry, meditating, and writing in her journal.  She reported that she would try to reduce sense of loneliness as well by seeking part-time work, and attending more social events in the community which could offer exposure to positive people.  Felicita stated "I need to learn to make a schedule and prioritize my time.  I want to surround myself with good people".  Kitzia asked to end session early due to her phone overheating.  Clinician will continue to monitor.        Plan: Meet again in 1 week virtually.   Diagnosis: Bipolar I Disorder, mixed, moderate; Borderline personality disorder; PTSD; Cannabis Use Disorder, moderate; and ADHD, combined type.   [name], LCSW, LCAS 05/09/21

## 2021-05-16 ENCOUNTER — Other Ambulatory Visit: Payer: Self-pay

## 2021-05-16 ENCOUNTER — Ambulatory Visit (INDEPENDENT_AMBULATORY_CARE_PROVIDER_SITE_OTHER): Payer: No Typology Code available for payment source | Admitting: Licensed Clinical Social Worker

## 2021-05-16 DIAGNOSIS — F4310 Post-traumatic stress disorder, unspecified: Secondary | ICD-10-CM | POA: Diagnosis not present

## 2021-05-16 DIAGNOSIS — F603 Borderline personality disorder: Secondary | ICD-10-CM

## 2021-05-16 DIAGNOSIS — F3162 Bipolar disorder, current episode mixed, moderate: Secondary | ICD-10-CM | POA: Diagnosis not present

## 2021-05-16 DIAGNOSIS — F1220 Cannabis dependence, uncomplicated: Secondary | ICD-10-CM

## 2021-05-16 DIAGNOSIS — F902 Attention-deficit hyperactivity disorder, combined type: Secondary | ICD-10-CM

## 2021-05-16 NOTE — Progress Notes (Signed)
Virtual Visit via Telephone Note   I connected with Courtney [name] on 05/16/21 at 3:00pm by telephone and verified that I am speaking with the correct person using two identifiers.   I discussed the limitations, risks, security and privacy concerns of performing an evaluation and management service by telephone and the availability of in person appointments. I also discussed with the patient that there may be a patient responsible charge related to this service. The patient expressed understanding and agreed to proceed.   I discussed the assessment and treatment plan with the patient. The patient was provided an opportunity to ask questions and all were answered. The patient agreed with the plan and demonstrated an understanding of the instructions.   The patient was advised to call back or seek an in-person evaluation if the symptoms worsen or if the condition fails to improve as anticipated.   I provided 30 minutes of non-face-to-face time during this encounter.   Courtney Stain, LCSW, LCAS ________________________ THERAPIST PROGRESS NOTE   Session Time: 3:00pm - 3:30pm     Location: Patient: Patient Home Provider: [facility]   Participation Level: Active   Behavioral Response: Alert, anxious mood   Type of Therapy:  Individual Therapy   Treatment Goals addressed: Depression and Anxiety management; Medication management; Monitoring substance use; Panic attack management    Interventions: CBT; grounding; relapse prevention   Summary: Courtney [name] is a 30 year old female that presented for therapy appointment and is diagnosed with Bipolar I Disorder, mixed, moderate; Borderline personality disorder; PTSD; Cannabis Use Disorder, moderate; and ADHD, combined type.        Suicidal/Homicidal: None; without intent or plan   Therapist Response: Clinician spoke with Courtney [name] for therapy session via phone call today, as she outreached the front desk beforehand and requested this  format instead of virtual.  Clinician called her as requested and assessed for safety, sobriety, and medication compliance.  Courtney [name] answered phone call and spoke in a manner that was alert, oriented x5, with no evidence or self-report of active SI/HI or A/V H.  Courtney [name] reported ongoing compliance with medication and denied any use of alcohol.  Courtney [name] reported that she has continued smoking marijuana each day.  Clinician inquired about Courtney [name]'s emotional ratings today, and whether she has had any significant changes in thoughts, feelings, or behavior since previous check-in.  Courtney [name] reported scores of 3/10 for depression, 10/10 for anxiety, 6/10 for anger/irritability and 0/10 for mania.  Courtney [name] denied any panic attacks or outbursts recently.  She reported that one recent success was having a good birthday, which involved spending time with close friends celebrating.  Courtney [name] reported that her ratings are higher today because she has been establishing a new schedule, stating "I have a lot I'm juggling right now".  During this conversation, Courtney [name] disclosed that she began to experience symptoms of a panic attack such as feeling lightheaded, and chest tightness.  [name] reported that she would typically rely on marijuana to cope, and felt more panicked as a result of having none available.  Clinician offered to teach Courtney [name] a grounding technique that could be utilized as a healthier alternative to substance use, and she was agreeable to this.  Clinician provided Courtney [name] with instruction on multi-part grounding technique to distract from distressing thoughts and feelings which could be contributing to panic symptoms.  This included practicing deep breathing, using all 5 senses to focus on various stimuli in surrounding environment, and reciting coping statements such as "I am okay" and "  This feeling will pass".  Intervention was effective, as evidenced by Courtney [name] successfully  participating in grounding activity, and reporting that panic symptoms disappeared, and her anxiety was reduced to 1/10 in severity as a result.  Courtney [name] stated "I'm really glad that I got to talk to you today because this helped me realize I was doing too much at once and needed to slow down".  Clinician emailed Courtney [name] a copy of activity to practice on her own, encouraged her to utilize this to reduce reliance upon marijuana, and will continue to monitor.           Plan: Meet again in 1 week virtually.   Diagnosis: Bipolar I Disorder, mixed, moderate; Borderline personality disorder; PTSD; Cannabis Use Disorder, moderate; and ADHD, combined type.   Courtney Stain, LCSW, LCAS 05/16/21

## 2021-05-23 ENCOUNTER — Other Ambulatory Visit: Payer: Self-pay

## 2021-05-23 ENCOUNTER — Ambulatory Visit (INDEPENDENT_AMBULATORY_CARE_PROVIDER_SITE_OTHER): Payer: No Typology Code available for payment source | Admitting: Licensed Clinical Social Worker

## 2021-05-23 DIAGNOSIS — F4310 Post-traumatic stress disorder, unspecified: Secondary | ICD-10-CM | POA: Diagnosis not present

## 2021-05-23 DIAGNOSIS — F1220 Cannabis dependence, uncomplicated: Secondary | ICD-10-CM | POA: Diagnosis not present

## 2021-05-23 DIAGNOSIS — F603 Borderline personality disorder: Secondary | ICD-10-CM | POA: Diagnosis not present

## 2021-05-23 DIAGNOSIS — F3162 Bipolar disorder, current episode mixed, moderate: Secondary | ICD-10-CM | POA: Diagnosis not present

## 2021-05-23 DIAGNOSIS — F902 Attention-deficit hyperactivity disorder, combined type: Secondary | ICD-10-CM

## 2021-05-23 NOTE — Progress Notes (Signed)
Virtual Visit via Video Note   I connected with Courtney [name] on 05/23/21 at 3:00pm by video enabled telemedicine application and verified that I am speaking with the correct person using two identifiers.   I discussed the limitations, risks, security and privacy concerns of performing an evaluation and management service by video and the availability of in person appointments. I also discussed with the patient that there may be a patient responsible charge related to this service. The patient expressed understanding and agreed to proceed.   I discussed the assessment and treatment plan with the patient. The patient was provided an opportunity to ask questions and all were answered. The patient agreed with the plan and demonstrated an understanding of the instructions.   The patient was advised to call back or seek an in-person evaluation if the symptoms worsen or if the condition fails to improve as anticipated.   I provided 1 hour of non-face-to-face time during this encounter.   [name], LCSW, LCAS ________________________ THERAPIST PROGRESS NOTE   Session Time: 3:00pm - 4:00pm     Location: Patient: Patient Home Provider: [facility]   Participation Level: Active   Behavioral Response: Alert, casually dressed, euthymic mood/affect   Type of Therapy:  Individual Therapy   Treatment Goals addressed: Depression and Anxiety management; Medication management; Seeking trauma treatment    Interventions: CBT, psychoeducation on PTSD, treatment, and referrals   Summary: Courtney [name] is a 30 year old female that presented for therapy appointment and is diagnosed with Bipolar I Disorder, mixed, moderate; Borderline personality disorder; PTSD; Cannabis Use Disorder, moderate; and ADHD, combined type.        Suicidal/Homicidal: None; without intent or plan   Therapist Response: Clinician spoke with Courtney [name] for virtual therapy session and assessed for safety, sobriety, and  medication compliance.  Courtney [name] presented for appointment on time and was alert, oriented x5, with no evidence or self-report of active SI/HI or A/V H.  Courtney [name] reported ongoing compliance with medication and denied any use of alcohol.  Courtney [name] reported that she has been abstaining from marijuana for the past 3 days, stating "I don't smoke while I'm at my parents house".  Clinician encouraged Courtney [name] to continue abstaining from substance use.  Clinician inquired about Courtney [name]'s emotional ratings today, and whether she has had any significant changes in thoughts, feelings, or behavior since previous check-in.  Courtney [name] reported scores of 0/10 for depression, 0/10 for anxiety, 0/10 for anger/irritability and 0/10 for mania.  Courtney [name] denied any panic attacks or outbursts recently.  She reported that she is currently staying over at her parent's house catching up for a few days, stating "They wanted to see me for a late birthday celebration".  Courtney [name] reported that she has recently been thinking about working on her past trauma through independent 'shadow work' and inquired about clinician's recommendations.  Clinician reminded Courtney [name] that this professional is not certified to do trauma work, and advised Architect self-treating her PTSD without supervision from a professional with experience in this area.  Clinician provided psychoeducation on PTSD symptoms and evidence based forms of treatment that could benefit her such as EMDR, PE, or CPT.  Clinician discussed the referral process for linkage to a certified trauma professional and discharge from current therapist should she decide to transfer care.  Interventions were effective, as evidenced by Courtney [name] reporting increased motivation to get professional help for treatment of her PTSD following discussion on this topic, and setting goal to research available trauma certified therapists that  accept her insurance over the next weeks.   Courtney [name] reported that she would inform this clinician about plans for discharge ahead of time if she finds an appropriate referral.  Clinician will continue to monitor.        Plan: Meet again in 1 week virtually.   Diagnosis: Bipolar I Disorder, mixed, moderate; Borderline personality disorder; PTSD; Cannabis Use Disorder, moderate; and ADHD, combined type.   [name], LCSW, LCAS 05/23/21

## 2021-05-30 ENCOUNTER — Other Ambulatory Visit: Payer: Self-pay

## 2021-05-30 ENCOUNTER — Ambulatory Visit (INDEPENDENT_AMBULATORY_CARE_PROVIDER_SITE_OTHER): Payer: No Typology Code available for payment source | Admitting: Licensed Clinical Social Worker

## 2021-05-30 DIAGNOSIS — F3162 Bipolar disorder, current episode mixed, moderate: Secondary | ICD-10-CM

## 2021-05-30 DIAGNOSIS — F1220 Cannabis dependence, uncomplicated: Secondary | ICD-10-CM | POA: Diagnosis not present

## 2021-05-30 DIAGNOSIS — F4310 Post-traumatic stress disorder, unspecified: Secondary | ICD-10-CM

## 2021-05-30 DIAGNOSIS — F603 Borderline personality disorder: Secondary | ICD-10-CM

## 2021-05-30 DIAGNOSIS — F902 Attention-deficit hyperactivity disorder, combined type: Secondary | ICD-10-CM

## 2021-05-30 NOTE — Progress Notes (Signed)
Virtual Visit via Video Note   I connected with Courtney [name] on 05/30/21 at 2:00pm by video enabled telemedicine application and verified that I am speaking with the correct person using two identifiers.   I discussed the limitations, risks, security and privacy concerns of performing an evaluation and management service by video and the availability of in person appointments. I also discussed with the patient that there may be a patient responsible charge related to this service. The patient expressed understanding and agreed to proceed.   I discussed the assessment and treatment plan with the patient. The patient was provided an opportunity to ask questions and all were answered. The patient agreed with the plan and demonstrated an understanding of the instructions.   The patient was advised to call back or seek an in-person evaluation if the symptoms worsen or if the condition fails to improve as anticipated.   I provided 1 hour of non-face-to-face time during this encounter.   Courtney Stain, LCSW, LCAS ________________________ THERAPIST PROGRESS NOTE   Session Time: 2:00pm - 3:00pm   Location: Patient: Patient Home Provider: [facility]   Participation Level: Active   Behavioral Response: Alert, casually dressed, anxious mood/affect    Type of Therapy:  Individual Therapy   Treatment Goals addressed: Depression and Anxiety management; Medication management; Monitoring substance use    Interventions: CBT, psychoeducation on risks of illicit substance use, relapse prevention    Summary: Courtney [name] is a 30 year old female that presented for therapy appointment and is diagnosed with Bipolar I Disorder, mixed, moderate; Borderline personality disorder; PTSD; Cannabis Use Disorder, moderate; and ADHD, combined type.        Suicidal/Homicidal: None; without intent or plan   Therapist Response: Clinician spoke with Courtney [name] for virtual therapy appointment and assessed for  safety, sobriety, and medication compliance.  Courtney [name] presented for session on time and was alert, oriented x5, with no evidence or self-report of active SI/HI or A/V H.  Courtney [name] reported that she has not taken her medication for 1 week.  She denied any use of alcohol.  She reported that she has run out of marijuana and has not used it in 2-3x days.  Courtney [name] reported that she used x2 ecstasy pills orally, and snorted 2 lines of cocaine over the last weekend.  Clinician inquired about Courtney [name]'s current emotional ratings, and whether she has had any significant changes in thoughts, feelings, or behavior since last check-in.  Courtney [name] reported scores of 2/10 for depression, 3/10 for anxiety, 0/10 for anger/irritability and 0/10 for mania.  Courtney [name] denied any outbursts recently, but noted that she has had several 'semi-panic attacks'.  Clinician encouraged Courtney [name] to abstain from use of illicit substances such as cocaine and ecstasy due to the potential legal, social, and health risks that could result from this.  Clinician assisted Courtney [name] in strengthening relapse prevention plan by discussing topic of external and internal triggers she should be mindful of, exploration of healthier coping skills to substitute, and refusal skills to practice if she is offered chance to use again.  Clinician also reminded Courtney [name] of importance in being compliant with all prescribed medications, and potential impact illicit substances could have on efficacy of these.  Clinician discussed strategies for improving compliance, including use of a calendar, reminders on her phone, or supports for greater accountability.  Interventions were effective, as evidenced by Courtney [name] reporting that she would begin taking steps to avoid gaps in medication regimen again, and having greater insight into risks involved in  continuing spontaneous use of illicit substances, stating "It could have killed me, and I know I shouldn't have come  off my meds again".  Courtney [name] reported that she would be more mindful of emotional triggers such as impulsivity and mania influencing tendency to use, and has disconnected from social media accounts in order to distance herself from 'toxic' people.  Courtney [name] reported that she would make effort to engage in healthier self-care activities such as writing poetry, and meditating.  Clinician will continue to monitor.        Plan: Meet again in 1 week virtually.   Diagnosis: Bipolar I Disorder, mixed, moderate; Borderline personality disorder; PTSD; Cannabis Use Disorder, moderate; and ADHD, combined type.   Courtney Stain, LCSW, LCAS 05/30/21

## 2021-06-06 ENCOUNTER — Ambulatory Visit (HOSPITAL_COMMUNITY): Payer: No Typology Code available for payment source | Admitting: Licensed Clinical Social Worker

## 2021-06-20 ENCOUNTER — Ambulatory Visit (INDEPENDENT_AMBULATORY_CARE_PROVIDER_SITE_OTHER): Payer: No Typology Code available for payment source | Admitting: Licensed Clinical Social Worker

## 2021-06-20 ENCOUNTER — Other Ambulatory Visit: Payer: Self-pay

## 2021-06-20 DIAGNOSIS — F1220 Cannabis dependence, uncomplicated: Secondary | ICD-10-CM | POA: Diagnosis not present

## 2021-06-20 DIAGNOSIS — F4310 Post-traumatic stress disorder, unspecified: Secondary | ICD-10-CM

## 2021-06-20 DIAGNOSIS — F603 Borderline personality disorder: Secondary | ICD-10-CM

## 2021-06-20 DIAGNOSIS — F3162 Bipolar disorder, current episode mixed, moderate: Secondary | ICD-10-CM | POA: Diagnosis not present

## 2021-06-20 DIAGNOSIS — F902 Attention-deficit hyperactivity disorder, combined type: Secondary | ICD-10-CM

## 2021-06-20 NOTE — Progress Notes (Signed)
Virtual Visit via Video Note   I connected with Courtney [name] on 06/20/21 at 2:00pm by video enabled telemedicine application and verified that I am speaking with the correct person using two identifiers.   I discussed the limitations, risks, security and privacy concerns of performing an evaluation and management service by video and the availability of in person appointments. I also discussed with the patient that there may be a patient responsible charge related to this service. The patient expressed understanding and agreed to proceed.   I discussed the assessment and treatment plan with the patient. The patient was provided an opportunity to ask questions and all were answered. The patient agreed with the plan and demonstrated an understanding of the instructions.   The patient was advised to call back or seek an in-person evaluation if the symptoms worsen or if the condition fails to improve as anticipated.   I provided 30 minutes of non-face-to-face time during this encounter.   [name], LCSW, LCAS ________________________ THERAPIST PROGRESS NOTE   Session Time: 2:00pm - 2:30pm   Location: Patient: Patient Home Provider: [facility]   Participation Level: Active   Behavioral Response: Alert, casually dressed, euthymic mood/affect    Type of Therapy:  Individual Therapy   Treatment Goals addressed: Depression and Anxiety management; Medication management; Monitoring substance use    Interventions: CBT, guided imagery    Summary: Courtney [name] is a 30 year old female that presented for therapy appointment and is diagnosed with Bipolar I Disorder, mixed, moderate; Borderline personality disorder; PTSD; Cannabis Use Disorder, moderate; and ADHD, combined type.        Suicidal/Homicidal: None; without intent or plan   Therapist Response: Clinician spoke with [name] for virtual therapy session and assessed for safety, sobriety, and medication compliance.  Thanh  presented for appointment on time and was alert, oriented x5, with no evidence or self-report of active SI/HI or A/V H.  Tevis reported ongoing compliance with mediation and denied use of alcohol.  Oria reported daily use of marijuana and denied any relevant consequences of use.  Clinician inquired about Tommie's emotional ratings today, and whether she has had any significant changes in thoughts, feelings, or behavior since last check-in.  Samara reported scores of 1/10 for depression, 1/10 for anxiety, 0/10 for anger/irritability and 0/10 for mania.  Brinleigh denied any outbursts or panic attacks.  Paisly reported that one success has been meditating, and cuddling with her cat more often to improve self-care.  Javae denied any present struggles, and did not have an agenda in mind or pressing issues she wished to address in session.  Clinician invited Anelia to participate in peaceful place guided imagery activity today as a form of self-care.  Clinician explained how this is a powerful visualization tool which can aid in reducing stress while increasing sense of calm, control, and awareness if practiced regularly.  Clinician informed Maicey beforehand that if she became uncomfortable at any point during activity, she could stop and open her eyes.  Clinician invited her to get comfortable, achieve a relaxing breathing rhythm, close her eyes, and then guided her through process of creating a 'peaceful place' which filled her with safety and calm.  Clinician encouraged Kingston to include sensory details involving vision, sound, touch, smell, and taste which she considered pleasant to enhance experience.  After 10 minutes of practice in session, clinician invited Mayu to share her opinion on the activity, including whether she was able to imagine a specific place, what details  stood out to her, and how this made her feel during and after.  Intervention was effective, as evidenced by  [name] participating in activity successfully, reporting that she imagined being in her room with her cat relaxing together, and stated "I feel more peaceful, calmer, at ease.  I need to ground more often".  Clinician encouraged her to practice this technique regularly as part of developing self-care routine, and substitute for ongoing marijuana use to develop healthier coping skills.  Clinician will continue to monitor.        Plan: Meet again in 1 week virtually.   Diagnosis: Bipolar I Disorder, mixed, moderate; Borderline personality disorder; PTSD; Cannabis Use Disorder, moderate; and ADHD, combined type.   [name], [place], LCAS 06/20/21

## 2021-06-27 ENCOUNTER — Ambulatory Visit (HOSPITAL_COMMUNITY): Payer: No Typology Code available for payment source | Admitting: Licensed Clinical Social Worker

## 2021-07-03 ENCOUNTER — Ambulatory Visit (INDEPENDENT_AMBULATORY_CARE_PROVIDER_SITE_OTHER): Payer: No Typology Code available for payment source | Admitting: Licensed Clinical Social Worker

## 2021-07-03 ENCOUNTER — Other Ambulatory Visit: Payer: Self-pay

## 2021-07-03 DIAGNOSIS — F3162 Bipolar disorder, current episode mixed, moderate: Secondary | ICD-10-CM

## 2021-07-03 DIAGNOSIS — F603 Borderline personality disorder: Secondary | ICD-10-CM | POA: Diagnosis not present

## 2021-07-03 DIAGNOSIS — F1220 Cannabis dependence, uncomplicated: Secondary | ICD-10-CM

## 2021-07-03 DIAGNOSIS — F4310 Post-traumatic stress disorder, unspecified: Secondary | ICD-10-CM | POA: Diagnosis not present

## 2021-07-03 DIAGNOSIS — F902 Attention-deficit hyperactivity disorder, combined type: Secondary | ICD-10-CM

## 2021-07-03 NOTE — Progress Notes (Signed)
Virtual Visit via Video Note   I connected with Courtney [name] on 07/03/21 at 3:00pm by video enabled telemedicine application and verified that I am speaking with the correct person using two identifiers.   I discussed the limitations, risks, security and privacy concerns of performing an evaluation and management service by video and the availability of in person appointments. I also discussed with the patient that there may be a patient responsible charge related to this service. The patient expressed understanding and agreed to proceed.   I discussed the assessment and treatment plan with the patient. The patient was provided an opportunity to ask questions and all were answered. The patient agreed with the plan and demonstrated an understanding of the instructions.   The patient was advised to call back or seek an in-person evaluation if the symptoms worsen or if the condition fails to improve as anticipated.   I provided 30 minutes of non-face-to-face time during this encounter.   [name], LCSW, LCAS ________________________ THERAPIST PROGRESS NOTE   Session Time: 3:00pm - 3:30pm     Location: Patient: Patient Home Provider: [facility] [place] [facility]   Participation Level: Active   Behavioral Response: Alert, casually dressed, euthymic mood/affect    Type of Therapy:  Individual Therapy   Treatment Goals addressed: Depression and Anxiety management; Medication management; Monitoring substance use; Improving communication skills    Interventions: CBT, communication skills   Summary: Courtney [name] is a 30 year old female that presented for therapy appointment and is diagnosed with Bipolar I Disorder, mixed, moderate; Borderline personality disorder; PTSD; Cannabis Use Disorder, moderate; and ADHD, combined type.        Suicidal/Homicidal: None; without intent or plan   Therapist Response: Clinician met with Courtney [name] for virtual therapy appointment and assessed for safety,  sobriety, and medication compliance.  Courtney [name] presented for session on time and was alert, oriented x5, with no evidence or self-report of active SI/HI or A/V H.  Courtney [name] reported that she continues taking mediation as prescribed and denied use of alcohol.  Courtney [name] reported that she is smoking marijuana 3-4x per week but denied any relevant consequences of use.  Clinician inquired about Courtney [name]'s current emotional ratings, and whether she has had any significant changes in thoughts, feelings, or behavior since previous check-in.  Courtney [name] reported scores of 2/10 for depression, 1/10 for anxiety, 0/10 for anger/irritability and 0/10 for mania.  Courtney [name] denied any panic attacks.  Courtney [name] reported that one success has been taking more time to meditate recently.  She reported that a recent struggle was having an outburst towards a roommate over the weekend.  Clinician inquired about what triggered this outburst, and how Courtney [name] attempted to cope.  Courtney [name] reported that she was venting to a friend about one of her roommate's and this person overheard her, which led to an argument, stating "I told him that he doesn't contribute or help around the house, and if he had something to say, to say it to my face".  Clinician reviewed topic of communication skills with Courtney [name] today in order to help reduce frequency of outbursts.  Clinician utilized a previous handout that detailed various communication 'traps' that she might experience when interacting with supports, and defined each one, including common examples such as criticism, defensiveness, contempt, stonewalling, overgeneralizing, arguing, and more.  Clinician inquired about which ones Courtney [name] engaged in, or saw demonstrated by supports like this roommate, and discussed strategies for assertively addressing them successfully to strengthen relationship and reinforce new skillset.  Intervention was  effective, as evidenced by Courtney [name] actively engaging  in discussion on the subject, reporting that she has been compartmentalizing negative feelings regarding this roommate, and when they called her out for talking about them, she felt criticized and became defensive, stating "I shouldn't have let it build up.  That would have prevented it from the beginning.  I'm going to try talking to him about my resentments this week, but I need to be in a calm mood, clear headed, and watch my volume.  It was good to talk about this and what I can do differently when someone is getting under my skin".  Clinician will continue to monitor.        Plan: Meet again in 1 week virtually.   Diagnosis: Bipolar I Disorder, mixed, moderate; Borderline personality disorder; PTSD; Cannabis Use Disorder, moderate; and ADHD, combined type.   [name], [place], LCAS 07/03/21

## 2021-07-17 ENCOUNTER — Other Ambulatory Visit: Payer: Self-pay

## 2021-07-17 ENCOUNTER — Ambulatory Visit (INDEPENDENT_AMBULATORY_CARE_PROVIDER_SITE_OTHER): Payer: No Typology Code available for payment source | Admitting: Licensed Clinical Social Worker

## 2021-07-17 DIAGNOSIS — F603 Borderline personality disorder: Secondary | ICD-10-CM

## 2021-07-17 DIAGNOSIS — F902 Attention-deficit hyperactivity disorder, combined type: Secondary | ICD-10-CM

## 2021-07-17 DIAGNOSIS — F1220 Cannabis dependence, uncomplicated: Secondary | ICD-10-CM | POA: Diagnosis not present

## 2021-07-17 DIAGNOSIS — F3162 Bipolar disorder, current episode mixed, moderate: Secondary | ICD-10-CM

## 2021-07-17 DIAGNOSIS — F4310 Post-traumatic stress disorder, unspecified: Secondary | ICD-10-CM | POA: Diagnosis not present

## 2021-07-17 NOTE — Progress Notes (Signed)
Virtual Visit via Video Note   I connected with Christella App on 07/17/21 at 3:00pm by video enabled telemedicine application and verified that I am speaking with the correct person using two identifiers.   I discussed the limitations, risks, security and privacy concerns of performing an evaluation and management service by video and the availability of in person appointments. I also discussed with the patient that there may be a patient responsible charge related to this service. The patient expressed understanding and agreed to proceed.   I discussed the assessment and treatment plan with the patient. The patient was provided an opportunity to ask questions and all were answered. The patient agreed with the plan and demonstrated an understanding of the instructions.   The patient was advised to call back or seek an in-person evaluation if the symptoms worsen or if the condition fails to improve as anticipated.   I provided 30 minutes of non-face-to-face time during this encounter.   [name], LCSW, LCAS ________________________ THERAPIST PROGRESS NOTE   Session Time: 3:00pm - 3:30pm    Location: Patient: Patient Home Provider: [facility] [place] [facility]   Participation Level: Active   Behavioral Response: Alert, casually dressed, euthymic mood/affect    Type of Therapy:  Individual Therapy   Treatment Goals addressed: Depression and Anxiety management; Medication management; Monitoring substance use; Psychiatry followup    Interventions: CBT, safety planning     Summary: Nissi Doffing is a 30 year old female that presented for therapy appointment and is diagnosed with Bipolar I Disorder, mixed, moderate; Borderline personality disorder; PTSD; Cannabis Use Disorder, moderate; and ADHD, combined type.        Suicidal/Homicidal: None; without intent or plan   Therapist Response: Clinician met with [name] for virtual therapy session and assessed for safety, sobriety, and medication  compliance.  Teigen presented for appointment on time and was alert, oriented x5, with no evidence or self-report of active SI/HI or A/V H.  Josphine reported ongoing compliance with medication and denied use of alcohol.  Kymora reported that she is smoking marijuana roughly once per day.  She denied any present consequences resulting from use.  Clinician inquired about Anye's emotional ratings today, and whether she has had any significant changes in thoughts, feelings, or behavior since last check-in.  Alitzel reported scores of 1/10 for depression, 2/10 for anxiety, 0/10 for anger/irritability and 2/10 for mania.  Ivelisse denied any panic attacks.  Hermina reported that one recent success was attending an initial appointment with a new psychiatrist over the past week and having her Cymbalta dose increased.  Julie-Anne reported that one struggle was experiencing severe pain 4 days ago, which led her to visit the ER for 6 hours, only to be turned away.  Milanni reported that she was very frustrated and feeling hopeless when she got home, and experienced fleeting SI without intent or plan. She denied experiencing any SI since this event, but clinician proposed discussing safety planning measures with her today, including identifying warning signs that a crisis could be building, healthy distraction strategies to engage in, and reliable supports she can outreach if individual coping methods prove ineffective during crisis (see suicide risk patient safety plan in chart).  Calie participated in safety planning process and was able to identify distraction activities such as meditation, playing video games, or exercising, and agreed to speak with family and friends for support, call national suicide hotline, or travel to the hospital for assessment if necessary should SI return with development of intent  and/or plan.  Interventions were effective, as evidenced by [name] participating in safety  planning process and reporting that she would be more mindful to watch out for warning signs such as feeling down, experiencing severe pain, isolating, or feeling hopeless and practice coping skills to distract from this when needed, as well reaching out for help from support network.  Creedence stated "I really needed to talk about this and appreciate your time.  I'm going to go to the gym later today because that has helped me a lot with stress lately too".  Clinician will continue to monitor.         Plan: Meet again in 1 week virtually.   Diagnosis: Bipolar I Disorder, mixed, moderate; Borderline personality disorder; PTSD; Cannabis Use Disorder, moderate; and ADHD, combined type.   [name], LCSW, LCAS 07/17/21

## 2021-07-25 ENCOUNTER — Other Ambulatory Visit: Payer: Self-pay

## 2021-07-25 ENCOUNTER — Ambulatory Visit (INDEPENDENT_AMBULATORY_CARE_PROVIDER_SITE_OTHER): Payer: No Typology Code available for payment source | Admitting: Licensed Clinical Social Worker

## 2021-07-25 DIAGNOSIS — F1220 Cannabis dependence, uncomplicated: Secondary | ICD-10-CM | POA: Diagnosis not present

## 2021-07-25 DIAGNOSIS — F3162 Bipolar disorder, current episode mixed, moderate: Secondary | ICD-10-CM | POA: Diagnosis not present

## 2021-07-25 DIAGNOSIS — F4310 Post-traumatic stress disorder, unspecified: Secondary | ICD-10-CM | POA: Diagnosis not present

## 2021-07-25 DIAGNOSIS — F902 Attention-deficit hyperactivity disorder, combined type: Secondary | ICD-10-CM

## 2021-07-25 DIAGNOSIS — F603 Borderline personality disorder: Secondary | ICD-10-CM | POA: Diagnosis not present

## 2021-07-25 NOTE — Progress Notes (Signed)
Virtual Visit via Video Note   I connected with Courtney [name] on 07/25/21 at 2:00pm by video enabled telemedicine application and verified that I am speaking with the correct person using two identifiers.   I discussed the limitations, risks, security and privacy concerns of performing an evaluation and management service by video and the availability of in person appointments. I also discussed with the patient that there may be a patient responsible charge related to this service. The patient expressed understanding and agreed to proceed.   I discussed the assessment and treatment plan with the patient. The patient was provided an opportunity to ask questions and all were answered. The patient agreed with the plan and demonstrated an understanding of the instructions.   The patient was advised to call back or seek an in-person evaluation if the symptoms worsen or if the condition fails to improve as anticipated.   I provided 30 minutes of non-face-to-face time during this encounter.   [name], LCSW, LCAS ________________________ THERAPIST PROGRESS NOTE   Session Time: 2:00pm - 2:30pm   Location: Patient: Patient Home Provider: [facility] [place] [facility]   Participation Level: Active   Behavioral Response: Alert, casually dressed, irritable mood/affect   Type of Therapy:  Individual Therapy   Treatment Goals addressed: Depression and Anxiety management; Medication management; Monitoring substance use   Interventions: CBT, urgent care referral, relaxation skills    Summary: Courtney [name] is a 30 year old female that presented for therapy appointment and is diagnosed with Bipolar I Disorder, mixed, moderate; Borderline personality disorder; PTSD; Cannabis Use Disorder, moderate; and ADHD, combined type.        Suicidal/Homicidal: None; without intent or plan   Therapist Response: Clinician met with [name] for virtual therapy appointment and assessed for safety, sobriety, and  medication compliance.  Courtney [name] presented for session on time and was alert, oriented x5, with no evidence or self-report of active SI/HI or A/V H.  Courtney [name] reported that she continues taking medication as prescribed and denied use of alcohol or illicit substances.  Clinician inquired about Courtney [name]'s current emotional ratings, and whether she has had any significant changes in thoughts, feelings, or behavior since previous check-in.  Courtney [name] reported scores of 1/10 for depression, 2/10 for anxiety, 2/10 for anger/irritability and 0/10 for mania.  Courtney [name] denied any recent panic attacks or outbursts.  Courtney [name] reported that one recent challenge has been feeling sick for two days, with symptoms including sore throat, congestion, fatigue, body aches, and a low grade fever.  Clinician inquired about whether Courtney [name] has outreached her doctor for guidance on managing these symptoms.  Courtney [name] denied having a PCP at this time to connect with, so clinician recommended that she contact local urgent care regarding whether she needs to come in to be assessed.  Courtney [name] was agreeable to this, and reported that she would call them today after our session.  Courtney [name] reported that sickness appears to have affected her pain level too, and rated this at a 7/10 in severity.  Clinician offered to teach Courtney [name] a mindfulness meditation exercise today focused on pain relief, and she was agreeable to this.  Clinician guided Courtney [name] through process of getting comfortable, achieving relaxing breathing rhythm, and then practicing combination activity involving comprehensive body scan, and reciting pain relief affirmations. Intervention was effective, as evidenced by [name] successfully engaging in this activity and reporting that her pain level reduced to 2/10 in severity, and she felt more physically and mentally relaxed afterward.  Courtney [name] expressed interest in practicing this  to help address pain when needed and  decrease reliance upon marijuana.  Clinician will continue to monitor.         Plan: Meet again in 1 week virtually.   Diagnosis: Bipolar I Disorder, mixed, moderate; Borderline personality disorder; PTSD; Cannabis Use Disorder, moderate; and ADHD, combined type.   [name], [place], LCAS 07/25/21

## 2021-08-01 ENCOUNTER — Other Ambulatory Visit: Payer: Self-pay

## 2021-08-01 ENCOUNTER — Ambulatory Visit (INDEPENDENT_AMBULATORY_CARE_PROVIDER_SITE_OTHER): Payer: No Typology Code available for payment source | Admitting: Licensed Clinical Social Worker

## 2021-08-01 DIAGNOSIS — F4310 Post-traumatic stress disorder, unspecified: Secondary | ICD-10-CM | POA: Diagnosis not present

## 2021-08-01 DIAGNOSIS — F1220 Cannabis dependence, uncomplicated: Secondary | ICD-10-CM

## 2021-08-01 DIAGNOSIS — F902 Attention-deficit hyperactivity disorder, combined type: Secondary | ICD-10-CM

## 2021-08-01 DIAGNOSIS — F603 Borderline personality disorder: Secondary | ICD-10-CM

## 2021-08-01 DIAGNOSIS — F3162 Bipolar disorder, current episode mixed, moderate: Secondary | ICD-10-CM

## 2021-08-01 NOTE — Progress Notes (Signed)
Virtual Visit via Video Note   I connected with Courtney [name] on 08/01/21 at 3:00pm by video enabled telemedicine application and verified that I am speaking with the correct person using two identifiers.   I discussed the limitations, risks, security and privacy concerns of performing an evaluation and management service by video and the availability of in person appointments. I also discussed with the patient that there may be a patient responsible charge related to this service. The patient expressed understanding and agreed to proceed.   I discussed the assessment and treatment plan with the patient. The patient was provided an opportunity to ask questions and all were answered. The patient agreed with the plan and demonstrated an understanding of the instructions.   The patient was advised to call back or seek an in-person evaluation if the symptoms worsen or if the condition fails to improve as anticipated.   I provided 32 minutes of non-face-to-face time during this encounter.   Courtney Flood, LCSW, LCAS ________________________ THERAPIST PROGRESS NOTE   Session Time: 3:00pm - 3:32pm    Location: Patient: Patient Home Provider: [facility]   Participation Level: Active   Behavioral Response: Alert, casually dressed, euthymic mood/affect   Type of Therapy:  Individual Therapy   Treatment Goals addressed: Depression and Anxiety management; Medication management; Monitoring substance use; Exercise Routine   Interventions: CBT, treatment planning    Summary: Courtney [name] is a 30 year old female that presented for therapy appointment and is diagnosed with Bipolar I Disorder, mixed, moderate; Borderline personality disorder; PTSD; Cannabis Use Disorder, moderate; and ADHD, combined type.        Suicidal/Homicidal: None; without intent or plan   Therapist Response: Clinician met with Courtney [name] for virtual therapy session and assessed for safety, sobriety, and medication  compliance.  Courtney [name] presented for appointment on time and was alert, oriented x5, with no evidence or self-report of active SI/HI or A/V H.  Courtney [name] reported ongoing compliance with medication and denied use of alcohol or illicit substances.  Clinician inquired about Courtney [name]'s emotional ratings today, and whether she has had any significant changes in thoughts, feelings, or behavior since last check-in.  Courtney [name] reported scores of 1/10 for depression, 1/10 for anxiety, 0/10 for anger/irritability and 0/10 for mania.  Courtney [name] denied any recent panic attacks or outbursts.  Courtney [name] reported that recent success was getting a long distance boyfriend who she met online and making plans to meet him in January.  Courtney [name] stated "We actually have a lot in common and I have spoken to his mom too, so I feel pretty good about it".  Courtney [name] denied any present challenges or needs to be addressed at present.  Clinician suggested revisiting treatment plan today in order to assess progress towards goals, and any barriers to success.  Courtney [name] was agreeable to this, so clinician collaborated with her to update goals as follows with her verbal consent: Meet with clinician virtually once per week for therapy to address ongoing progress towards goals and any barriers to success; Meet with psychiatrist once per month to address efficacy of medication and make adjustments as needed to regimen and/or dosage; Take medication daily as prescribed to reduce symptoms and improve overall daily functioning, setting daily reminder on phone for further reinforcement; Reduce depression from average severity of 3/10 down to 1/10 over the next 90 days by engaging in positive self-care activities for 2 hours each day, such as listening to music, watching movies, playing video games, doing makeup, cutting hair, and/or  reading; Reduce anxiety from average severity of 3/10 down to 1/10, as well as maintain panic attacks at 0 per month via  utilization of 3-4 relaxation techniques daily such as mindful breathing, meditation, progressive muscle relaxation, grounding techniques, and/or positive visualizations; Continue working to become more independent in daily activities in order to increase sense of self-reliance, confidence, and curb dependency on other people; Identify 2-3 issues with communication skills that can be addressed to improve socialization with supports and reduce borderline tendencies within next 90 days;  Write 1 page at a minimum in journal each day regarding thoughts, feelings, and behaviors that arise, with goal of better understanding mood changes for implementation of appropriate copings skills ahead of time (I want to observe, but not absorb other people's feelings like empaths do); Exercise 4-5 times per week for 1 hour at a minimum each visit to improve both physical and mental well-being; Continue working to establish zero balanced budget within next 60 days in an effort to save money each month, and curb impulsive spending behavior; Monitor marijuana/Delta 8 use closely to manage dependence and negative impact upon mental health, motivation, and/or other goal progress; Voluntarily seek hospitalization with assistance from support system and/or medical professionals should SI/HI appear and safety of self or others is determined at risk due to development of intent, plan, and access to means necessary to carry out plan.  Progress is evidenced by Courtney [name] continuing to engage in regular therapy and psychiatry appointments, taking medications responsibly, maintaining exercise regimen, reporting reduction in depression down to average of 3/10, and reporting reduction in anxiety down to 3/10.  Courtney [name] reported that she continues to struggle with establishing a budget, and tracking mood changes in her journal. Clinician will continue to monitor.         Plan: Meet again in 1 week virtually.   Diagnosis: Bipolar I Disorder,  mixed, moderate; Borderline personality disorder; PTSD; Cannabis Use Disorder, moderate; and ADHD, combined type.   Courtney [name], [place], LCAS 08/01/21

## 2021-08-15 ENCOUNTER — Other Ambulatory Visit: Payer: Self-pay

## 2021-08-15 ENCOUNTER — Ambulatory Visit (INDEPENDENT_AMBULATORY_CARE_PROVIDER_SITE_OTHER): Payer: No Typology Code available for payment source | Admitting: Licensed Clinical Social Worker

## 2021-08-15 DIAGNOSIS — F3162 Bipolar disorder, current episode mixed, moderate: Secondary | ICD-10-CM | POA: Diagnosis not present

## 2021-08-15 DIAGNOSIS — F4310 Post-traumatic stress disorder, unspecified: Secondary | ICD-10-CM

## 2021-08-15 DIAGNOSIS — F902 Attention-deficit hyperactivity disorder, combined type: Secondary | ICD-10-CM

## 2021-08-15 DIAGNOSIS — F1220 Cannabis dependence, uncomplicated: Secondary | ICD-10-CM | POA: Diagnosis not present

## 2021-08-15 DIAGNOSIS — F603 Borderline personality disorder: Secondary | ICD-10-CM | POA: Diagnosis not present

## 2021-08-15 NOTE — Progress Notes (Signed)
Virtual Visit via Video Note  I connected with Courtney [name] on 08/15/21 at 3:00pm by video enabled telemedicine application and verified that I am speaking with the correct person using two identifiers.   I discussed the limitations, risks, security and privacy concerns of performing an evaluation and management service by video and the availability of in person appointments. I also discussed with the patient that there may be a patient responsible charge related to this service. The patient expressed understanding and agreed to proceed.   I discussed the assessment and treatment plan with the patient. The patient was provided an opportunity to ask questions and all were answered. The patient agreed with the plan and demonstrated an understanding of the instructions.   The patient was advised to call back or seek an in-person evaluation if the symptoms worsen or if the condition fails to improve as anticipated.   I provided 30 minutes of non-face-to-face time during this encounter.   [name], LCSW, LCAS ________________________ THERAPIST PROGRESS NOTE   Session Time: 3:00pm - 3:30pm   Location: Patient: Patient Home Provider: [facility]   Participation Level: Active   Behavioral Response: Alert, casually dressed, euthymic mood/affect   Type of Therapy:  Individual Therapy   Treatment Goals addressed: Depression and Anxiety management; Medication management; Monitoring substance use   Interventions: CBT, psychoeducation on healthy relationships    Summary: Courtney [name] is a 31 year old female that presented for therapy appointment and is diagnosed with Bipolar I Disorder, mixed, moderate; Borderline personality disorder; PTSD; Cannabis Use Disorder, moderate; and ADHD, combined type.        Suicidal/Homicidal: None; without intent or plan   Therapist Response: Clinician met with Courtney [name] for virtual therapy appointment and assessed for safety, sobriety, and medication  compliance.  Courtney [name] presented for session on time and was alert, oriented x5, with no evidence or self-report of active SI/HI or A/V H.  Courtney [name] reported that she is taking medication as prescribed and denied use of alcohol or illicit substances.  Clinician inquired about Courtney [name]'s current emotional ratings, and whether she has had any significant changes in thoughts, feelings, or behavior since previous check-in.  Courtney [name] reported scores of 0/10 for depression, 1/10 for anxiety, 0/10 for anger/irritability and 1/10 for mania.  Courtney [name] denied any recent panic attacks or outbursts.  Courtney [name] reported that recent success was spending the holidays with her family, stating "It was fine and I got a gratitude journal from my brother to work on".  Courtney [name] reported that one struggle was Getting dumped on new years day without any warning.  Clinician expressed sympathy for Courtney [name]'s loss, and inquired about events that led up to this abrupt separation.  Courtney [name] reported that they had a disagreement that morning and the person blocked her after claiming that Courtney [name] reminded him of his ex-girlfriend's negative traits, which made her second guess whether she had mistreated him.  Clinician provided psychoeducation on traits of healthy relationships using a handout to guide discussion with Courtney [name] on the subject.  This handout explained how attachments to connections within one's support network (i.e. friends, family, romantic partners, coworkers, Social research officer, government) can influence one's mental health, and emphasized importance of looking for green lights (positive behaviors) that can be considered normal, as well as red lights (harmful behaviors) which suggest that a boundary should be established. Courtney [name] was tasked with identifying green lights (i.e. respect, trust, appreciation, healthy conflict resolution, etc) and red lights (i.e. contempt, suspicion, impatience, lack of growth, etc) that were present  in previous  relationship in order to help her determine what (if any) role she played in recent separation, and assist in gauging quality of future relationships.  Intervention was effective, as evidenced by Courtney [name] actively participating in discussion on subject, and identifying several unhealthy traits that her partner displayed recently in the relationship, such as lack of communication, patience, empathy, and individuality.  Courtney [name] reported that at times she can also be impatient, and suspicious, but will continue engaging in therapy in order to address these behaviors.  Courtney [name] stated Maybe it all worked out for the best.  I definitely don't want to be with someone that doesn't want to grow because we're all a work in progress.  Clinician will continue to monitor.         Plan: Meet again in 1 week virtually.   Diagnosis: Bipolar I Disorder, mixed, moderate; Borderline personality disorder; PTSD; Cannabis Use Disorder, moderate; and ADHD, combined type.   [name], LCSW, LCAS 08/15/21

## 2021-08-22 ENCOUNTER — Ambulatory Visit (HOSPITAL_COMMUNITY): Payer: No Typology Code available for payment source | Admitting: Licensed Clinical Social Worker

## 2021-08-22 ENCOUNTER — Other Ambulatory Visit: Payer: Self-pay

## 2021-09-09 ENCOUNTER — Ambulatory Visit (HOSPITAL_COMMUNITY): Payer: No Typology Code available for payment source | Admitting: Licensed Clinical Social Worker

## 2021-09-09 ENCOUNTER — Encounter (HOSPITAL_COMMUNITY): Payer: Self-pay

## 2021-09-09 ENCOUNTER — Other Ambulatory Visit: Payer: Self-pay

## 2021-09-09 ENCOUNTER — Telehealth (HOSPITAL_COMMUNITY): Payer: Self-pay | Admitting: Licensed Clinical Social Worker

## 2021-09-09 NOTE — Telephone Encounter (Signed)
Sreshta had a virtual therapy appointment scheduled today for 1pm.  Clinician outreached Yailene by phone at 1pm, 1:05pm, and 1:15pm, but received no answer, even after leaving a voicemail following first outreach attempt reminding her of today's appointment.  Clinician informed front desk staff of no-show event following last unsuccessful attempt.     [name], [place], LCAS 09/09/21

## 2021-09-16 ENCOUNTER — Ambulatory Visit (INDEPENDENT_AMBULATORY_CARE_PROVIDER_SITE_OTHER): Payer: No Typology Code available for payment source | Admitting: Licensed Clinical Social Worker

## 2021-09-16 ENCOUNTER — Other Ambulatory Visit: Payer: Self-pay

## 2021-09-16 DIAGNOSIS — F902 Attention-deficit hyperactivity disorder, combined type: Secondary | ICD-10-CM

## 2021-09-16 DIAGNOSIS — F4310 Post-traumatic stress disorder, unspecified: Secondary | ICD-10-CM

## 2021-09-16 DIAGNOSIS — F1220 Cannabis dependence, uncomplicated: Secondary | ICD-10-CM | POA: Diagnosis not present

## 2021-09-16 DIAGNOSIS — F3162 Bipolar disorder, current episode mixed, moderate: Secondary | ICD-10-CM

## 2021-09-16 DIAGNOSIS — F603 Borderline personality disorder: Secondary | ICD-10-CM

## 2021-09-16 NOTE — Progress Notes (Signed)
Virtual Visit via Video Note  I connected with Courtney [name] on 09/16/21 at 2:00pm by video enabled telemedicine application and verified that I am speaking with the correct person using two identifiers.   I discussed the limitations, risks, security and privacy concerns of performing an evaluation and management service by video and the availability of in person appointments. I also discussed with the patient that there may be a patient responsible charge related to this service. The patient expressed understanding and agreed to proceed.   I discussed the assessment and treatment plan with the patient. The patient was provided an opportunity to ask questions and all were answered. The patient agreed with the plan and demonstrated an understanding of the instructions.   The patient was advised to call back or seek an in-person evaluation if the symptoms worsen or if the condition fails to improve as anticipated.   I provided 1 hour of non-face-to-face time during this encounter.   [name], LCSW, LCAS ________________________ THERAPIST PROGRESS NOTE   Session Time: 2:00pm - 3:00pm    Location: Patient: Patient Home Provider: [facility]   Participation Level: Active   Behavioral Response: Alert, casually dressed, depressed mood/affect    Type of Therapy:  Individual Therapy   Treatment Goals addressed: Depression and Anxiety management; Medication management; Monitoring substance use   Interventions: CBT, relapse prevention, healthy boundaries    Summary: Courtney [name] is a 31 year old female that presented for therapy appointment and is diagnosed with Bipolar I Disorder, mixed, moderate; Borderline personality disorder; PTSD; Cannabis Use Disorder, moderate; and ADHD, combined type.        Suicidal/Homicidal: None; without intent or plan   Therapist Response: Clinician met with Courtney [name] for virtual therapy session and assessed for safety, sobriety, and medication  compliance.  Courtney [name] presented for appointment on time and was alert, oriented x5, with no evidence or self-report of active SI/HI or A/V H.  Courtney [name] reported that she used an unknown quantity of cocaine and MDMA over the past weekend and didn't take medications for at least 3 days.  Clinician inquired about Courtney [name]'s emotional ratings today, and whether she has had any significant changes in thoughts, feelings, or behavior since last check-in.  Courtney [name] reported scores of 6/10 for depression, 6/10 for anxiety, 2/10 for anger/irritability and 0/10 for mania.  Courtney [name] denied any recent outbursts.  Courtney [name] reported that a recent struggle was experiencing a "Big panic attack over the weekend and calling out of work".  Clinician inquired about recent triggers which led Courtney [name] to skip medications and engage in polysubstance use.  Courtney [name] reported that someone messaged her over the weekend on a dating site and they 'hit it off', suggesting that they hang out, and offered her these drugs while socializing for the first time, so she didn't take her medications in order to feel the full effects.  Courtney [name] reported that this person said they had to step out to take a phone call, and then didn't return, stating "I just felt more depressed after all that.  I got attached too quick because I think we had a trauma bond.  It broke my heart".  Clinician encouraged Courtney [name] to abstain from use of illicit substances such as cocaine and MDMA due to the multitude of legal, social, and health risks that could result from this.  Clinician assisted Courtney [name] in strengthening relapse prevention plan by discussing external and internal triggers that could have influenced decision to use, exploration of healthier coping skills to substitute,  and refusal skills to practice if she is offered the opportunity to use again.  Clinician also reminded Courtney [name] of importance in being compliant with all prescribed medications, and  negative impact illicit substances could have on daily functioning.  Interventions were effective, as evidenced by Courtney [name] identifying internal and external triggers which preceded substance use episode such as feelings of loneliness and excitement, as well as healthier strategies for coping, including talking to supportive peers or meditating before impulsive decisions are made, using exercise or jewelry making as a health outlet, and establishing healthier social media boundaries in order to reinforce focus upon her own self-care and wellbeing.  Courtney [name] stated "I'm already back on my meds now and I'm going to be more careful about who I let into my life because I don't want to get hurt like that again".  Clinician will continue to monitor.         Plan: Meet again in 1 week virtually.   Diagnosis: Bipolar I Disorder, mixed, moderate; Borderline personality disorder; PTSD; Cannabis Use Disorder, moderate; and ADHD, combined type.   [name], LCSW, LCAS 09/16/21

## 2021-09-23 ENCOUNTER — Ambulatory Visit (INDEPENDENT_AMBULATORY_CARE_PROVIDER_SITE_OTHER): Payer: No Typology Code available for payment source | Admitting: Licensed Clinical Social Worker

## 2021-09-23 ENCOUNTER — Other Ambulatory Visit: Payer: Self-pay

## 2021-09-23 DIAGNOSIS — F4310 Post-traumatic stress disorder, unspecified: Secondary | ICD-10-CM

## 2021-09-23 DIAGNOSIS — F603 Borderline personality disorder: Secondary | ICD-10-CM

## 2021-09-23 DIAGNOSIS — F3162 Bipolar disorder, current episode mixed, moderate: Secondary | ICD-10-CM | POA: Diagnosis not present

## 2021-09-23 DIAGNOSIS — F902 Attention-deficit hyperactivity disorder, combined type: Secondary | ICD-10-CM

## 2021-09-23 DIAGNOSIS — F1220 Cannabis dependence, uncomplicated: Secondary | ICD-10-CM

## 2021-09-23 NOTE — Progress Notes (Signed)
Virtual Visit via Video Note ° °I connected with Courtney [name] on 09/23/21 at 2:00pm by video enabled telemedicine application and verified that I am speaking with the correct person using two identifiers. °  °I discussed the limitations, risks, security and privacy concerns of performing an evaluation and management service by video and the availability of in person appointments. I also discussed with the patient that there may be a patient responsible charge related to this service. The patient expressed understanding and agreed to proceed. °  °I discussed the assessment and treatment plan with the patient. The patient was provided an opportunity to ask questions and all were answered. The patient agreed with the plan and demonstrated an understanding of the instructions. °  °The patient was advised to call back or seek an in-person evaluation if the symptoms worsen or if the condition fails to improve as anticipated. °  °I provided 30 minutes of non-face-to-face time during this encounter. °  ° , LCSW, LCAS °________________________ °THERAPIST PROGRESS NOTE °  °Session Time: 2:00pm - 2:30pm    ° °Location: °Patient: Patient Home °Provider: Clinician Home Office   °  °Participation Level: Active °  °Behavioral Response: Alert, casually dressed, euthymic mood/affect  °  °Type of Therapy:  Individual Therapy °  °Treatment Goals addressed: Depression and Anxiety management; Medication management; Monitoring substance use  °  °Interventions: CBT; distress tolerance skills  °  °Summary: Courtney [name] is a 31 year old female that presented for therapy appointment and is diagnosed with Bipolar I Disorder, mixed, moderate; Borderline personality disorder; PTSD; Cannabis Use Disorder, moderate; and ADHD, combined type.      °  °Suicidal/Homicidal: None; without intent or plan °  °Therapist Response: Clinician met with Courtney [name] for virtual therapy appointment and assessed for safety, sobriety, and medication  compliance.  Courtney [name] presented for session on time and was alert, oriented x5, with no evidence or self-report of active SI/HI or A/V H.  Courtney [name] reported that she used THC once over past week, and denied alcohol use.  Clinician inquired about Courtney [name]'s current emotional ratings, and whether she has had any significant changes in thoughts, feelings, or behavior since previous check-in.  Courtney [name] reported scores of 1/10 for depression, 1/10 for anxiety, 0/10 for anger/irritability and 0/10 for mania.  Courtney [name] denied any recent outbursts or panic attacks.  Courtney [name] reported that a recent success was maintaining new work schedule and going to the gym as a stress outlet.  Courtney [name] reported that she didn't take her medication for two days in a row.  Clinician reminded Courtney [name] of importance in not missing any doses, and offered tips on improving compliance, such as setting daily reminders on phone or placing medication in a more visible place to her.  Courtney [name] reported that she would be more disciplined about medication compliance, stating “I know its not good when I skip”.  She reported that today she wished to work on impulsivity for therapy.  Clinician introduced topic of 'urge surfing' today.  Clinician explained how this technique can be used to avoid acting upon a behavior that needs to be reduced or stopped completely.  Clinician provided common examples of maladaptive behaviors, such as smoking, overeating, substance use, excessive spending, lashing out emotionally, and more.  Clinician explained how urges rarely last more than 30 minutes if they are not ruminated upon, and attempts to fight or suppress the urge can ultimately cause the problem to grow.  Clinician guided Courtney [name] through practice of combination mindfulness, relaxation, and visualization   exercises in order to surf an urge of her concern until it faded.  Intervention was effective, as evidenced by Courtney [name] participating in urge  surfing activity and reporting that she focused on the urge to eat, and although she found it difficult to focus at times due to ADHD, she did notice the urge subside with time and stated I did feel more relaxed afterward too. She reported that she would practice this exercise in free time to cut back on maladaptive behavior, stating I can be very impulsive about a lot of things. Clinician will continue to monitor.         Plan: Meet again in 1 week virtually.   Diagnosis: Bipolar I Disorder, mixed, moderate; Borderline personality disorder; PTSD; Cannabis Use Disorder, moderate; and ADHD, combined type.   [name], LCSW, LCAS 09/23/21

## 2021-09-30 ENCOUNTER — Ambulatory Visit (INDEPENDENT_AMBULATORY_CARE_PROVIDER_SITE_OTHER): Payer: No Typology Code available for payment source | Admitting: Licensed Clinical Social Worker

## 2021-09-30 ENCOUNTER — Other Ambulatory Visit: Payer: Self-pay

## 2021-09-30 DIAGNOSIS — F3162 Bipolar disorder, current episode mixed, moderate: Secondary | ICD-10-CM

## 2021-09-30 DIAGNOSIS — F1220 Cannabis dependence, uncomplicated: Secondary | ICD-10-CM | POA: Diagnosis not present

## 2021-09-30 DIAGNOSIS — F603 Borderline personality disorder: Secondary | ICD-10-CM | POA: Diagnosis not present

## 2021-09-30 DIAGNOSIS — F902 Attention-deficit hyperactivity disorder, combined type: Secondary | ICD-10-CM

## 2021-09-30 DIAGNOSIS — F4310 Post-traumatic stress disorder, unspecified: Secondary | ICD-10-CM | POA: Diagnosis not present

## 2021-09-30 NOTE — Progress Notes (Signed)
Virtual Visit via Video Note  I connected with Courtney [name] on 09/30/21 at 3:00pm by video enabled telemedicine application and verified that I am speaking with the correct person using two identifiers.   I discussed the limitations, risks, security and privacy concerns of performing an evaluation and management service by video and the availability of in person appointments. I also discussed with the patient that there may be [name] patient responsible charge related to this service. The patient expressed understanding and agreed to proceed.   I discussed the assessment and treatment plan with the patient. The patient was provided an opportunity to ask questions and all were answered. The patient agreed with the plan and demonstrated an understanding of the instructions.   The patient was advised to call back or seek an in-person evaluation if the symptoms worsen or if the condition fails to improve as anticipated.   I provided 33 minutes of non-face-to-face time during this encounter.   Courtney Flood, LCSW, LCAS ________________________ THERAPIST PROGRESS NOTE   Session Time: 3:00pm - 3:33pm     Location: Patient: Patient Home Provider: [facility] [place] [facility]     Participation Level: Active   Behavioral Response: Alert, casually dressed, euthymic mood/affect    Type of Therapy:  Individual Therapy   Treatment Goals addressed: Depression and Anxiety management; Medication management; Monitoring substance use; Maintaining exercise regimen   Progress Towards Goals: Progressing   Interventions: CBT; psychoeducation on sleep hygiene    Summary: Courtney [name] is [name] 31 year old female that presented for therapy appointment and is diagnosed with Bipolar I Disorder, mixed, moderate; Borderline personality disorder; PTSD; Cannabis Use Disorder, moderate; and ADHD, combined type.        Suicidal/Homicidal: None; without intent or plan   Therapist Response: Clinician met with Courtney [name] for virtual  therapy session and assessed for safety, sobriety, and medication compliance.  Courtney [name] presented for appointment on time and was alert, oriented x5, with no evidence or self-report of active SI/HI or [name]/V H.  Courtney [name] reported that she used marijuana once over past week and took 1 shot of vodka.  Clinician inquired about Courtney [name]'s emotional ratings today, and whether she has had any significant changes in thoughts, feelings, or behavior since last check-in.  Courtney [name] reported scores of 0/10 for depression, 0/10 for anxiety, 0/10 for anger/irritability and 0/10 for mania.  Courtney [name] denied any recent outbursts or panic attacks.  Courtney [name] reported that [name] recent success has been working at her part-time job and visiting the gym regularly.  Courtney [name] reported that one issue has been feeling tired most days, acknowledging that her sleep has not been consistent.  Clinician provided psychoeducation on topic of sleep hygiene with Courtney [name] today, defining this as the healthy habits, behaviors, and environmental factors that can be adjusted to improve quality of one's rest at night.  Clinician discussed various techniques with Courtney [name] today that could be implemented to improve her sleep habits, including sticking to [name] normal wake/sleep routine, avoiding using of electronics or consumption of heavy meals too close to bedtime, ensuring regular exercise routine during the day to increase fatigue before bedtime, making changes to bedroom environment as needed (i.e. blackout curtains, sound machine, etc), using [name] sleep journal to track effectiveness of changes, avoiding naps during the daytime, ensuring use of sleep medication as directed by psychiatrist, and more.  Intervention was effective, as evidenced by Courtney [name] actively engaging in discussion on topic and reporting that several tips could be implemented in order to begin improving average nightly  rest this week such as creating [name] routine of going to bed by  midnight during the week, avoiding use of her phone before bed, avoiding soda with caffeine around bedtime, creating [name] nighttime ritual to calm anxious thoughts via techniques like meditation or listening to calming music, maintaining regular exercise, keeping [name] sleep journal to track average rest, and avoiding naps throughout the day.   Courtney [name] stated It was good to talk about this.  I'm definitely going to try some of these out.  Clinician will continue to monitor.         Plan: Meet again in 1 week virtually.   Diagnosis: Bipolar I Disorder, mixed, moderate; Borderline personality disorder; PTSD; Cannabis Use Disorder, moderate; and ADHD, combined type.   Collaboration of Care:   No collaboration required at this time.                                                   Patient/Guardian was advised Release of Information must be obtained prior to any record release in order to collaborate their care with an outside provider. Patient/Guardian was advised if they have not already done so to contact the registration department to sign all necessary forms in order for [name] to release information regarding their care.   Consent: Patient/Guardian gives verbal consent for treatment and assignment of benefits for services provided during this visit. Patient/Guardian expressed understanding and agreed to proceed.  Courtney Flood, LCSW, LCAS 09/30/21

## 2021-10-14 ENCOUNTER — Ambulatory Visit (INDEPENDENT_AMBULATORY_CARE_PROVIDER_SITE_OTHER): Payer: No Typology Code available for payment source | Admitting: Licensed Clinical Social Worker

## 2021-10-14 ENCOUNTER — Other Ambulatory Visit: Payer: Self-pay

## 2021-10-14 DIAGNOSIS — F4310 Post-traumatic stress disorder, unspecified: Secondary | ICD-10-CM

## 2021-10-14 DIAGNOSIS — F603 Borderline personality disorder: Secondary | ICD-10-CM | POA: Diagnosis not present

## 2021-10-14 DIAGNOSIS — F1220 Cannabis dependence, uncomplicated: Secondary | ICD-10-CM

## 2021-10-14 DIAGNOSIS — F3162 Bipolar disorder, current episode mixed, moderate: Secondary | ICD-10-CM

## 2021-10-14 DIAGNOSIS — F902 Attention-deficit hyperactivity disorder, combined type: Secondary | ICD-10-CM

## 2021-10-14 NOTE — Progress Notes (Signed)
Virtual Visit via Video Note ? ?I connected with [name] Hulet on 10/14/21 at 3:00pm by video enabled telemedicine application and verified that I am speaking with the correct person using two identifiers. ?  ?I discussed the limitations, risks, security and privacy concerns of performing an evaluation and management service by video and the availability of in person appointments. I also discussed with the patient that there may be a patient responsible charge related to this service. The patient expressed understanding and agreed to proceed. ?  ?I discussed the assessment and treatment plan with the patient. The patient was provided an opportunity to ask questions and all were answered. The patient agreed with the plan and demonstrated an understanding of the instructions. ?  ?The patient was advised to call back or seek an in-person evaluation if the symptoms worsen or if the condition fails to improve as anticipated. ?  ?I provided 37 minutes of non-face-to-face time during this encounter. ?  ?[name], LCSW, LCAS ?________________________ ?THERAPIST PROGRESS NOTE ?  ?Session Time: 3:00pm - 3:37pm     ? ?Location: ?Patient: Patient Home ?Provider: [facility] [place] [facility]   ?  ?Participation Level: Active ?  ?Behavioral Response: Alert, casually dressed, 'numb' mood, depressed affect ?  ?Type of Therapy:  Individual Therapy ?  ?Treatment Goals addressed: Depression and Anxiety management; Medication management; Monitoring substance use ? ?Progress Towards Goals: Ongoing  ?  ?Interventions: CBT, relapse prevention  ?  ?Summary: Courtney [name] is a 31 year old female that presented for therapy appointment and is diagnosed with Bipolar I Disorder, mixed, moderate; Borderline personality disorder; PTSD; Cannabis Use Disorder, moderate; and ADHD, combined type.      ?  ?Suicidal/Homicidal: None; without intent or plan ?  ?Therapist Response: Clinician met with Courtney [name] for virtual therapy appointment and assessed for  safety, sobriety, and medication compliance.  Courtney [name] presented for session on time and was alert, oriented x5, with no evidence or self-report of active SI/HI or A/V H.  Courtney [name] reported that she missed 4 consecutive days of medication, and smoked cocaine, drank alcohol, used an unknown quantity of ecstasy, and psilocybin mushrooms with a work friend one day over the past weekend.  Clinician inquired about Devora's current emotional ratings, and whether she has had any significant changes in thoughts, feelings, or behavior since previous check-in.  Courtney [name] reported scores of 0/10 for depression, 0/10 for anxiety, 0/10 for anger/irritability and 0/10 for mania.  Courtney [name] denied any recent outbursts or panic attacks, but reported that she felt 'numb' today after restarting medication.  Clinician inquired about stressors that could have triggered Ivory to use these substances over the weekend, and whether she attempted to utilize any healthy coping skills beforehand.  Courtney [name] reported that she had ?A bad day at work? when interacting with a customer, and stated ?It made me realize that I still have anger issues.  It triggered my PTSD?Marland Kitchen  Alizandra reported that she tried her best to stay calm, but she was written up anyways because of her attitude, and when hanging out with a friend later, she was offered these substances, and could not refuse.  Caili reported that since this incident, she is now seeing a separate therapist for trauma work to aid in managing PTSD symptoms.  Clinician was supportive of this decision, and encouraged Courtney [name] to continue setting regular appointments to supplement ongoing therapy with existing provider.  Clinician utilized relapse prevention handout on topic of justifications with Courtney [name] to assist in curbing substance abuse patterns when  faced with stressors.  Several common relapse justification scenarios were presented for consideration, including blaming someone  else, catastrophic events, using for specific purposes, or self-medicating unpleasant emotions such as anxiety, anger, loneliness or fear.  Clinician explored healthier alternatives for coping with challenges like this via use of positive self-talk, exercise, or speaking with a sober peer for support.  Clinician also discussed refusal skills that could be implemented if offered substances in the future by similar peers.  Intervention was effective, as evidenced by Courtney [name] identifying several justifications which influenced her recent substance abuse, including feelings of anxiety, stress, and anger from dealing with unruly customers at work, maintaining a busier schedule that has reduced time for self-care, and proximity to a peer with easy access to substances. Courtney [name] reported that she would be more consistent with her medication and self-care activities to reduce negative impact upon mood and behavior, and begin practicing her refusal skills in order to successfully decline any future offers to use substances from peers.  Courtney [name] stated ?I accept that I have a choice when I'm dealing with this stuff, and can say 'No'?.   Clinician will continue to monitor.   ?      ?Plan: Meet again in 1 week virtually.  ? ?Diagnosis: Bipolar I Disorder, mixed, moderate; Borderline personality disorder; PTSD; Cannabis Use Disorder, moderate; and ADHD, combined type.  ? ?Collaboration of Care:   No collaboration required at this time.   ?                                                ?Patient/Guardian was advised Release of Information must be obtained prior to any record release in order to collaborate their care with an outside provider. Patient/Guardian was advised if they have not already done so to contact the registration department to sign all necessary forms in order for [name] to release information regarding their care.  ?  ?Consent: Patient/Guardian gives verbal consent for treatment and assignment of benefits for  services provided during this visit. Patient/Guardian expressed understanding and agreed to proceed. ? ?[name], LCSW, LCAS ?10/14/21   ?

## 2021-10-21 ENCOUNTER — Ambulatory Visit (HOSPITAL_COMMUNITY): Payer: No Typology Code available for payment source | Admitting: Licensed Clinical Social Worker

## 2021-10-28 ENCOUNTER — Ambulatory Visit (INDEPENDENT_AMBULATORY_CARE_PROVIDER_SITE_OTHER): Payer: No Typology Code available for payment source | Admitting: Licensed Clinical Social Worker

## 2021-10-28 ENCOUNTER — Other Ambulatory Visit: Payer: Self-pay

## 2021-10-28 DIAGNOSIS — F4310 Post-traumatic stress disorder, unspecified: Secondary | ICD-10-CM | POA: Diagnosis not present

## 2021-10-28 DIAGNOSIS — F1220 Cannabis dependence, uncomplicated: Secondary | ICD-10-CM

## 2021-10-28 DIAGNOSIS — F3162 Bipolar disorder, current episode mixed, moderate: Secondary | ICD-10-CM | POA: Diagnosis not present

## 2021-10-28 DIAGNOSIS — F603 Borderline personality disorder: Secondary | ICD-10-CM | POA: Diagnosis not present

## 2021-10-28 DIAGNOSIS — F902 Attention-deficit hyperactivity disorder, combined type: Secondary | ICD-10-CM
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
## 2021-11-04 ENCOUNTER — Ambulatory Visit (HOSPITAL_COMMUNITY): Payer: No Typology Code available for payment source | Admitting: Licensed Clinical Social Worker

## 2021-11-05 ENCOUNTER — Telehealth (INDEPENDENT_AMBULATORY_CARE_PROVIDER_SITE_OTHER): Payer: No Typology Code available for payment source | Admitting: Licensed Clinical Social Worker

## 2021-11-05 ENCOUNTER — Other Ambulatory Visit: Payer: Self-pay

## 2021-11-05 ENCOUNTER — Ambulatory Visit (HOSPITAL_COMMUNITY): Payer: No Typology Code available for payment source | Admitting: Licensed Clinical Social Worker

## 2021-11-05 NOTE — Telephone Encounter (Signed)
Ainara had a virtual therapy appointment scheduled today at [facility] presented for appointment on time via [facility] application and was alert, oriented x5, with no evidence or self-report of SI/HI or A/V H.  Brandey reported that she had attempted to cancel our appointment the day before and could not reach staff at our front desk.  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   ? ?[name], LCSW, LCAS ?11/05/21 ?

## 2021-11-11 ENCOUNTER — Ambulatory Visit (INDEPENDENT_AMBULATORY_CARE_PROVIDER_SITE_OTHER): Payer: No Typology Code available for payment source | Admitting: Licensed Clinical Social Worker

## 2021-11-11 DIAGNOSIS — F3162 Bipolar disorder, current episode mixed, moderate: Secondary | ICD-10-CM

## 2021-11-11 DIAGNOSIS — F4310 Post-traumatic stress disorder, unspecified: Secondary | ICD-10-CM | POA: Diagnosis not present

## 2021-11-11 DIAGNOSIS — F1220 Cannabis dependence, uncomplicated: Secondary | ICD-10-CM

## 2021-11-11 DIAGNOSIS — F603 Borderline personality disorder: Secondary | ICD-10-CM | POA: Diagnosis not present

## 2021-11-11 DIAGNOSIS — F902 Attention-deficit hyperactivity disorder, combined type: Secondary | ICD-10-CM

## 2021-11-11 NOTE — Progress Notes (Signed)
Virtual Visit via Video Note ? ?I connected with Courtney [name] on 11/11/21 at 3:00pm by video enabled telemedicine application and verified that I am speaking with the correct person using two identifiers. ?  ?I discussed the limitations, risks, security and privacy concerns of performing an evaluation and management service by video and the availability of in person appointments. I also discussed with the patient that there may be a patient responsible charge related to this service. The patient expressed understanding and agreed to proceed. ?  ?I discussed the assessment and treatment plan with the patient. The patient was provided an opportunity to ask questions and all were answered. The patient agreed with the plan and demonstrated an understanding of the instructions. ?  ?The patient was advised to call back or seek an in-person evaluation if the symptoms worsen or if the condition fails to improve as anticipated. ?  ?I provided 32 minutes of non-face-to-face time during this encounter. ?  ?[name], LCSW, LCAS ?________________________ ?THERAPIST PROGRESS NOTE ?  ?Session Time: 3:00pm - 3:32pm  ? ?Location: ?Patient: Patient Home ?Provider: [facility]   ?  ?Participation Level: Active ?  ?Behavioral Response: Alert, casually dressed, euthymic mood/affect ?  ?Type of Therapy:  Individual Therapy ?  ?Treatment Goals addressed: Depression and Anxiety management; Medication management; Monitoring substance use ? ?Progress Towards Goals: Progressing  ?  ?Interventions: CBT, psychoeducation on healthy relationships  ?  ?Summary: Courtney [name] is a 31 year old female that presented for therapy appointment and is diagnosed with Bipolar I Disorder, mixed, moderate; Borderline personality disorder; PTSD; Cannabis Use Disorder, moderate; and ADHD, combined type.      ?  ?Suicidal/Homicidal: None; without intent or plan ?  ?Therapist Response: Clinician met with Courtney [name] for virtual therapy session and assessed  for safety, sobriety, and medication compliance.  Courtney [name] presented for appointment on time and was alert, oriented x5, with no evidence or self-report of active SI/HI or A/V H.  Courtney [name] reported ongoing compliance with medication and reported that the last time she used illicit substances was several days ago when she smoked an unknown quantity of marijuana.  Clinician inquired about Courtney [name]'s emotional ratings today, and whether she has had any significant changes in thoughts, feelings, or behavior since last check-in.  Courtney [name] reported scores of 0/10 for depression, 0/10 for anxiety, 0/10 for anger/irritability and 0/10 for mania.  Courtney [name] denied any recent outbursts or panic attacks.  Courtney [name] reported that a recent success was meeting a new romantic partner online since our last session, stating ?I guess it was a little fast, but you know me.  When I fall, I fall hard?.  Clinician reviewed psychoeducation on traits of healthy relationships using a handout to guide discussion with Courtney [name] on the subject.  This handout explained how attachments to connections within one's support network (i.e. friends, family, romantic partners, coworkers, Social research officer, government) can influence one's mental health, and emphasized importance of looking for green lights (positive behaviors) that can be considered normal, as well as red lights (harmful behaviors) which suggest that a boundary should be established. Courtney [name] was tasked with identifying green lights (i.e. respect, trust, appreciation, healthy conflict resolution, etc) and red lights (i.e. contempt, suspicion, impatience, lack of growth, etc) that are present in newest developing relationship in order to help her gauge quality of this partner and overall impact on mood and wellness.  Intervention was effective, as evidenced by Courtney [name] actively participating in discussion on subject, and reporting that although this person is different  in several positive ways from past  partners that could be labeled as 'toxic', she does feel that the relationship has moved too quickly due to their mutual impulsive nature, since she just met him the other day, but has already been intimate, and spending time at their home.  Courtney [name] stated ?I want someone I can spend time with who is affectionate and genuinely loves me for who I am.  I am going to be less impulsive and slow things down to see if he is the right guy for me after all.  I know I need to be careful since I'm bipolar and borderline?.  Clinician will continue to monitor.   ?      ?Plan: Meet again in 1 week virtually.  ? ?Diagnosis: Bipolar I Disorder, mixed, moderate; Borderline personality disorder; PTSD; Cannabis Use Disorder, moderate; and ADHD, combined type.  ? ?Collaboration of Care:   No collaboration required at this time.   ?                                                ?Patient/Guardian was advised Release of Information must be obtained prior to any record release in order to collaborate their care with an outside provider. Patient/Guardian was advised if they have not already done so to contact the registration department to sign all necessary forms in order for [name] to release information regarding their care.  ?  ?Consent: Patient/Guardian gives verbal consent for treatment and assignment of benefits for services provided during this visit. Patient/Guardian expressed understanding and agreed to proceed. ? ?[name], LCSW, LCAS ?11/11/21   ?

## 2021-11-27 ENCOUNTER — Ambulatory Visit (INDEPENDENT_AMBULATORY_CARE_PROVIDER_SITE_OTHER): Payer: No Typology Code available for payment source | Admitting: Licensed Clinical Social Worker

## 2021-11-27 ENCOUNTER — Ambulatory Visit (HOSPITAL_COMMUNITY): Payer: No Typology Code available for payment source | Admitting: Licensed Clinical Social Worker

## 2021-11-27 DIAGNOSIS — F603 Borderline personality disorder: Secondary | ICD-10-CM | POA: Diagnosis not present

## 2021-11-27 DIAGNOSIS — F3162 Bipolar disorder, current episode mixed, moderate: Secondary | ICD-10-CM | POA: Diagnosis not present

## 2021-11-27 DIAGNOSIS — F4310 Post-traumatic stress disorder, unspecified: Secondary | ICD-10-CM

## 2021-11-27 DIAGNOSIS — F1220 Cannabis dependence, uncomplicated: Secondary | ICD-10-CM | POA: Diagnosis not present

## 2021-11-27 DIAGNOSIS — F902 Attention-deficit hyperactivity disorder, combined type: Secondary | ICD-10-CM

## 2021-11-27 NOTE — Progress Notes (Signed)
Virtual Visit via Video Note ? ?I connected with Courtney [name] on 11/27/21 at 1:00pm by video enabled telemedicine application and verified that I am speaking with the correct person using two identifiers. ?  ?I discussed the limitations, risks, security and privacy concerns of performing an evaluation and management service by video and the availability of in person appointments. I also discussed with the patient that there may be a patient responsible charge related to this service. The patient expressed understanding and agreed to proceed. ?  ?I discussed the assessment and treatment plan with the patient. The patient was provided an opportunity to ask questions and all were answered. The patient agreed with the plan and demonstrated an understanding of the instructions. ?  ?The patient was advised to call back or seek an in-person evaluation if the symptoms worsen or if the condition fails to improve as anticipated. ?  ?I provided 45 minutes of non-face-to-face time during this encounter. ?  ?[name], LCSW, LCAS ?________________________ ?THERAPIST PROGRESS NOTE ?  ?Session Time: 1:00pm - 1:45pm   ? ?Location: ?Patient: Patient Home ?Provider: [facility] Home Office   ?  ?Participation Level: Active ?  ?Behavioral Response: Alert, casually dressed, depressed mood/affect ?  ?Type of Therapy:  Individual Therapy ?  ?Treatment Goals addressed: Depression and Anxiety management; Medication management; Monitoring substance use; Safety planning  ? ?Progress Towards Goals: Progressing  ?  ?Interventions: CBT: applying the cognitive triangle model  ?  ?Summary: Courtney [name] is a 31 year old female that presented for therapy appointment and is diagnosed with Bipolar I Disorder, mixed, moderate; Borderline personality disorder; PTSD; Cannabis Use Disorder, moderate; and ADHD, combined type.      ?  ?Suicidal/Homicidal: None; without intent or plan ?  ?Therapist Response: Clinician met with Courtney [name] for virtual  therapy appointment and assessed for safety, sobriety, and medication compliance.  Courtney [name] presented for session on time and was alert, oriented x5, with no evidence or self-report of active SI/HI or A/V H.  Courtney [name] reported that she has been smoking marijuana once per day.  Clinician inquired about Courtney [name]'s current emotional ratings, and whether she has had any significant changes in thoughts, feelings, or behavior since previous check-in.  Courtney [name] reported scores of 2/10 for depression, 2/10 for anxiety, 0/10 for anger/irritability and 0/10 for mania.  [name] reported that on 11/16/21 she was transported to the [place] ED after experiencing SI with vague plan to [name] herself if she could locate a weapon.  Courtney [name] reported that she stayed in their behavioral unit for several days, and they adjusted her medication, taking her off of Cymbalta and increasing her Vraylar due to concerns this regimen had been negatively affecting severity of bipolar disorder.  Courtney [name] reported that she found this hospital stay to be beneficial for ?Getting away from the world so I could get my head straight?.  Clinician praised Courtney [name] for following safety plan by reaching out for professional help during a crisis.  Clinician virtually shared a CBT handout with Courtney [name] to guide reflection on recent events which triggered this crisis, and alternative approaches that could have been implemented early on to deescalate.  This handout explained how the cognitive triad model could be applied to difficult situations in order to demonstrate the relationship between one's thoughts, emotions, and behaviors, with emphasis placed on changing one's thinking to alleviate unpleasant emotions and curb maladaptive behaviors (i.e. substance use, self-[name]).  Intervention was effective, as evidenced by Courtney [name] actively engaging in discussion on subject, and finding this  handout to be helpful for building insight into recent triggers  which fueled crisis event, including the end of a friendship, use of stimulants recently along with missed doses of medication affecting mood, as well as work stress and feelings of loneliness and overwhelming fear.  [name] acknowledged that she has stopped engaging in healthy coping activities as frequently and would benefit from spending more time on meditation, deep breathing, and positive self-talk, stating ?I need to prioritize myself, and keep focus on bettering me?Marland Kitchen  [name] reported that she would also continue to follow safety plan, and voluntarily check herself into the hospital again if necessary should SI return with development of intent and/or plan to [name] self or anyone else. Clinician will continue to monitor.   ?      ?Plan: Meet again in 1 week virtually.  ? ?Diagnosis: Bipolar I Disorder, mixed, moderate; Borderline personality disorder; PTSD; Cannabis Use Disorder, moderate; and ADHD, combined type.  ? ?Collaboration of Care:   No collaboration required at this time.   ?                                                ?Patient/Guardian was advised Release of Information must be obtained prior to any record release in order to collaborate their care with an outside provider. Patient/Guardian was advised if they have not already done so to contact the registration department to sign all necessary forms in order for [name] to release information regarding their care.  ?  ?Consent: Patient/Guardian gives verbal consent for treatment and assignment of benefits for services provided during this visit. Patient/Guardian expressed understanding and agreed to proceed. ? ?[name], LCSW, LCAS ?11/27/21   ?

## 2021-12-16 ENCOUNTER — Ambulatory Visit (HOSPITAL_COMMUNITY): Payer: No Typology Code available for payment source | Admitting: Licensed Clinical Social Worker

## 2021-12-23 ENCOUNTER — Encounter (HOSPITAL_COMMUNITY): Payer: Self-pay

## 2021-12-23 ENCOUNTER — Ambulatory Visit (HOSPITAL_COMMUNITY): Payer: No Typology Code available for payment source | Admitting: Licensed Clinical Social Worker

## 2021-12-30 ENCOUNTER — Ambulatory Visit (INDEPENDENT_AMBULATORY_CARE_PROVIDER_SITE_OTHER): Payer: No Typology Code available for payment source | Admitting: Licensed Clinical Social Worker

## 2021-12-30 DIAGNOSIS — F3162 Bipolar disorder, current episode mixed, moderate: Secondary | ICD-10-CM | POA: Diagnosis not present

## 2021-12-30 DIAGNOSIS — F902 Attention-deficit hyperactivity disorder, combined type: Secondary | ICD-10-CM

## 2021-12-30 DIAGNOSIS — F603 Borderline personality disorder: Secondary | ICD-10-CM

## 2021-12-30 DIAGNOSIS — F1220 Cannabis dependence, uncomplicated: Secondary | ICD-10-CM | POA: Diagnosis not present

## 2021-12-30 DIAGNOSIS — F4310 Post-traumatic stress disorder, unspecified: Secondary | ICD-10-CM

## 2021-12-30 NOTE — Progress Notes (Signed)
Virtual Visit via Video Note  I connected with Courtney [name] on 12/30/21 at 2:07pm by video enabled telemedicine application and verified that I am speaking with the correct person using two identifiers.   I discussed the limitations, risks, security and privacy concerns of performing an evaluation and management service by video and the availability of in person appointments. I also discussed with the patient that there may be a patient responsible charge related to this service. The patient expressed understanding and agreed to proceed.   I discussed the assessment and treatment plan with the patient. The patient was provided an opportunity to ask questions and all were answered. The patient agreed with the plan and demonstrated an understanding of the instructions.   The patient was advised to call back or seek an in-person evaluation if the symptoms worsen or if the condition fails to improve as anticipated.   I provided 32 minutes of non-face-to-face time during this encounter.   [name], LCSW, LCAS ________________________ THERAPIST PROGRESS NOTE   Session Time: 2:07pm - 2:39pm   Location: Patient: Patient Home Provider: Clinician Home Office    Participation Level: Active   Behavioral Response: Alert, casually dressed, anxious mood/affect   Type of Therapy:  Individual Therapy   Treatment Goals addressed: Depression and Anxiety management; Medication management; Monitoring substance use  Progress Towards Goals: Progressing    Interventions: CBT, problem solving, relapse prevention     Summary: Courtney [name] is a 31 year old female that presented for therapy appointment and is diagnosed with Bipolar I Disorder, mixed, moderate; Borderline personality disorder; PTSD; Cannabis Use Disorder, moderate; and ADHD, combined type.        Suicidal/Homicidal: None; without intent or plan   Therapist Response: Clinician met with Courtney [name] for virtual therapy session and  assessed for safety, sobriety, and medication compliance.  This virtual meeting took place later than scheduled due to confusion regarding [name] reaching out beforehand about a concern regarding a survey, and staff interpreting that she wished to cancel the session.  Courtney [name] presented for appointment 7 minutes late as a result, but was alert, oriented x5, with no evidence or self-report of active SI/HI or A/V H.  Courtney [name] reported that she has not used alcohol or drugs for over 10 days due to an upcoming operation.  Clinician inquired about Courtney [name]'s emotional ratings today, and whether she has had any significant changes in thoughts, feelings, or behavior since last check-in.  Courtney [name] reported scores of 5/10 for depression, 7/10 for anxiety, 5/10 for anger/irritability and 0/10 for mania.  Courtney [name] denied any recent outbursts.  She reported that on the past several weeks have been difficult due to not keeping up with therapy appointments as often, stating "I've been very irritable, anxious, and keep getting overstimulated".  Courtney [name] reported that a success as getting initial approval for a gastric sleeve procedure to help with weight loss and general health, although this has required her to stay abstinent from nonapproved illicit substances, which has been a struggle.  Clinician reminded Courtney [name] of the importance in regularly practicing coping skills learned from therapy such as gratitude journaling, meditation, and grounding when faced with challenges like these in order to stay sober and ensure she is approved for surgery as scheduled.  Clinician reviewed various skills Courtney [name] could utilize when faced with typical external and internal triggers to avoid relapse.   Courtney [name] was receptive to feedback from clinician on subject, reporting that she does need to push herself to meditate more often and engage  in positive self-talk, stating "I have times when I backslide, and I know missing out on  therapy hasn't helped either".  Courtney [name] reported that due to her increased anxiety, she has been having trouble with panic attacks out in public recently, and although she has used relaxation skills learned from therapy at times, she would like to bring her cat with her as a daily emotional support animal.  Courtney [name] inquired about whether clinician could directly assist in this process.  Clinician informed Courtney [name] that he would be unable to provide documentation approving her cat as a service animal due to lack of medical license/experience required to make this determination, but was supportive of exploring this process to improve her overall coping ability.  Clinician explored various online resources with Courtney [name] on the steps required in getting a service animal approved in state of [place], in addition to weighing costs and benefits of beginning this registration process.  Courtney [name] was understanding of clinician's limitations, and reported that she would follow up with her PCP about their thoughts on the matter and ability to complete related paperwork.  Courtney [name] reported that she needed to end session early due to her phone overheating.  Progress is evidenced by Courtney [name] following up for therapy today after an extended absence, reporting medication compliance, abstinence from illicit substances and increased motivation to regularly practice healthier coping skills in order to curb maladaptive habits.  Clinician will continue to monitor.         Plan: Meet again in 1 week virtually.   Diagnosis: Bipolar I Disorder, mixed, moderate; Borderline personality disorder; PTSD; Cannabis Use Disorder, moderate; and ADHD, combined type.   Collaboration of Care:   No collaboration required at this time.                                                   Patient/Guardian was advised Release of Information must be obtained prior to any record release in order to collaborate their care with an outside provider.  Patient/Guardian was advised if they have not already done so to contact the registration department to sign all necessary forms in order for [name] to release information regarding their care.    Consent: Patient/Guardian gives verbal consent for treatment and assignment of benefits for services provided during this visit. Patient/Guardian expressed understanding and agreed to proceed.  [name], LCSW, LCAS 12/30/21

## 2022-01-13 ENCOUNTER — Ambulatory Visit (INDEPENDENT_AMBULATORY_CARE_PROVIDER_SITE_OTHER): Payer: No Typology Code available for payment source | Admitting: Licensed Clinical Social Worker

## 2022-01-13 DIAGNOSIS — F603 Borderline personality disorder: Secondary | ICD-10-CM | POA: Diagnosis not present

## 2022-01-13 DIAGNOSIS — F3162 Bipolar disorder, current episode mixed, moderate: Secondary | ICD-10-CM

## 2022-01-13 DIAGNOSIS — F4310 Post-traumatic stress disorder, unspecified: Secondary | ICD-10-CM

## 2022-01-13 DIAGNOSIS — F1220 Cannabis dependence, uncomplicated: Secondary | ICD-10-CM | POA: Diagnosis not present

## 2022-01-13 DIAGNOSIS — F902 Attention-deficit hyperactivity disorder, combined type: Secondary | ICD-10-CM

## 2022-01-13 NOTE — Progress Notes (Signed)
Virtual Visit via Video Note  I connected with Courtney [name] on 01/13/22 at 2:00pm by video enabled telemedicine application and verified that I am speaking with the correct person using two identifiers.   I discussed the limitations, risks, security and privacy concerns of performing an evaluation and management service by video and the availability of in person appointments. I also discussed with the patient that there may be a patient responsible charge related to this service. The patient expressed understanding and agreed to proceed.   I discussed the assessment and treatment plan with the patient. The patient was provided an opportunity to ask questions and all were answered. The patient agreed with the plan and demonstrated an understanding of the instructions.   The patient was advised to call back or seek an in-person evaluation if the symptoms worsen or if the condition fails to improve as anticipated.   I provided 1 hour of non-face-to-face time during this encounter.   [name], LCSW, LCAS ________________________ THERAPIST PROGRESS NOTE   Session Time: 2:00pm - 3:00pm  Location: Patient: Patient Home Provider: Clinician Home Office    Participation Level: Active   Behavioral Response: Alert, casually dressed, euthymic mood/affect   Type of Therapy:  Individual Therapy   Treatment Goals addressed: Depression and Anxiety management; Medication management; Monitoring substance use  Progress Towards Goals: Progressing    Interventions: CBT, psychoeducation on kratom, relapse prevention planning     Summary: Courtney [name] is a 31 year old female that presented for therapy appointment and is diagnosed with Bipolar I Disorder, mixed, moderate; Borderline personality disorder; PTSD; Cannabis Use Disorder, moderate; and ADHD, combined type.        Suicidal/Homicidal: None; without intent or plan   Therapist Response: Clinician met with Courtney [name] for virtual therapy  appointment and assessed for safety, sobriety, and medication compliance.  Courtney [name] presented for session on time and was alert, oriented x5, with no evidence or self-report of active SI/HI or A/V H.  Courtney [name] reported that she has not used alcohol or illicit drugs for over 3 weeks.  Clinician inquired about Courtney [name] current emotional ratings, and whether she has had any significant changes in thoughts, feelings, or behavior since previous check-in.  Courtney [name] reported scores of 3/10 for depression, 3/10 for anxiety, 1/10 for anger/irritability and 0/10 for mania.  Courtney [name] denied any recent outbursts or panic attacks.  Clinician praised [name] for ongoing sobriety, and inquired about how changes she has made to sustain this progress.  Courtney [name] reported that she has been working out regularly with her new boyfriend, which has helped her lose weight, and served as a healthy stress outlet.  She reported that she has also been in touch with her medication manager, and this has helped manage mania and impulse control.  Courtney [name] reported that she has been abstaining from compulsive shopping as well.  Courtney [name] reported that she has started using Kratom as a substitute to help cope with pain, which was a strong justification for her THC use.  Clinician provided psychoeducation on the mental and physical consequences that can result from abuse of Kratom, including insomnia, irritability, restlessness, mood swings, muscle pain, and joint stiffness.  Clinician encouraged her to abstain from future use due to her addictive personality, and revisited discussion on relapse prevention techniques with Courtney [name] to aid in preventing future use, including practice of refusal skills, and exploration of healthy self-care activities which could replace substance use, such as relaxation skills, socializing with sober peers, and meaningful outlets for stress such  as exercise.  Intervention was effective, as evidenced by  Courtney [name] reporting that discussion on this topic helped increase insight into consequences that ongoing Kratom use could result in, and made her more mindful of triggers which reinforce addictive patterns.  [name] reported that she is more motivated to engage in healthier mood management approaches to pain, such as meditation, positive self-talk, and exercise. Clinician will continue to monitor.         Plan: Meet again in 1 week virtually.   Diagnosis: Bipolar I Disorder, mixed, moderate; Borderline personality disorder; PTSD; Cannabis Use Disorder, moderate; and ADHD, combined type.   Collaboration of Care:   No collaboration required at this time.                                                   Patient/Guardian was advised Release of Information must be obtained prior to any record release in order to collaborate their care with an outside provider. Patient/Guardian was advised if they have not already done so to contact the registration department to sign all necessary forms in order for [name] to release information regarding their care.    Consent: Patient/Guardian gives verbal consent for treatment and assignment of benefits for services provided during this visit. Patient/Guardian expressed understanding and agreed to proceed.  [name], LCSW, LCAS 01/13/22

## 2022-01-20 ENCOUNTER — Ambulatory Visit (HOSPITAL_COMMUNITY): Payer: No Typology Code available for payment source | Admitting: Licensed Clinical Social Worker

## 2022-01-27 ENCOUNTER — Ambulatory Visit (INDEPENDENT_AMBULATORY_CARE_PROVIDER_SITE_OTHER): Payer: No Typology Code available for payment source | Admitting: Licensed Clinical Social Worker

## 2022-01-27 DIAGNOSIS — F3162 Bipolar disorder, current episode mixed, moderate: Secondary | ICD-10-CM

## 2022-01-27 DIAGNOSIS — F1220 Cannabis dependence, uncomplicated: Secondary | ICD-10-CM | POA: Diagnosis not present

## 2022-01-27 DIAGNOSIS — F603 Borderline personality disorder: Secondary | ICD-10-CM

## 2022-01-27 DIAGNOSIS — F4310 Post-traumatic stress disorder, unspecified: Secondary | ICD-10-CM

## 2022-01-27 DIAGNOSIS — F902 Attention-deficit hyperactivity disorder, combined type: Secondary | ICD-10-CM

## 2022-01-27 NOTE — Progress Notes (Signed)
Virtual Visit via Video Note  I connected with Courtney [name] on 01/27/22 at 1:00pm by video enabled telemedicine application and verified that I am speaking with the correct person using two identifiers.   I discussed the limitations, risks, security and privacy concerns of performing an evaluation and management service by video and the availability of in person appointments. I also discussed with the patient that there may be a patient responsible charge related to this service. The patient expressed understanding and agreed to proceed.   I discussed the assessment and treatment plan with the patient. The patient was provided an opportunity to ask questions and all were answered. The patient agreed with the plan and demonstrated an understanding of the instructions.   The patient was advised to call back or seek an in-person evaluation if the symptoms worsen or if the condition fails to improve as anticipated.   I provided 45 minutes of non-face-to-face time during this encounter.   [name], LCSW, LCAS ________________________ THERAPIST PROGRESS NOTE   Session Time: 1:00pm - 1:45pm   Location: Patient: Patient Home Provider: Clinician Home Office    Participation Level: Active   Behavioral Response: Alert, casually dressed, anxious mood/affect   Type of Therapy:  Individual Therapy   Treatment Goals addressed: Depression and Anxiety management; Medication management; Monitoring substance use  Progress Towards Goals: Progressing    Interventions: CBT, solution focused therapy    Summary: Courtney [name] is a 31 year old female that presented for therapy appointment and is diagnosed with Bipolar I Disorder, mixed, moderate; Borderline personality disorder; PTSD; Cannabis Use Disorder, moderate; and ADHD, combined type.        Suicidal/Homicidal: None; without intent or plan   Therapist Response: Clinician met with Courtney [name] for virtual therapy session and assessed for  safety, sobriety, and medication compliance.  Courtney [name] presented for appointment on time and was alert, oriented x5, with no evidence or self-report of active SI/HI or A/V H.  Courtney [name] reported that she has not used alcohol or illicit drugs since last check-in.  Clinician inquired about Courtney [name]'s emotional ratings today, and whether she has had any significant changes in thoughts, feelings, or behavior since last check-in.  Courtney [name] reported scores of 7/10 for depression, 10/10 for anxiety, 7/10 for anger/irritability and 0/10 for mania.  Courtney [name] denied any recent outbursts.  Courtney [name] reported that she has been experiencing frequent panic attacks, stating "I probably have 1 a day right now".  Clinician inquired about triggers which could have influenced this recent mood change.  Courtney [name] reported that she learned that she has to move in two months because she can no longer afford the apartment she is staying in, and doesn't know where she can go.  Clinician utilized a solution focused approach with Courtney [name] to identify options which could help ensure Courtney [name] maintains shelter, including exploration of ways to increase available income (i.e. changing jobs), locating cheaper living spaces (I.e. lower rent housing, living with friends/family temporarily), or shelters to stay in if homelessness is certain.  [name] reported that she could live with her mother temporarily if needed while she saves money, and her boyfriend could keep the cat, but she worries about how to handle stress of living with her parents since they can be triggering.  Clinician reminded Courtney [name] of available coping skills she has acquired from therapy (I.e. communication, grounding, relaxation, etc) which could be of use during transition.  Intervention was effective, as evidenced by Courtney [name]'s active engagement in discussion of realistic solutions, and identifying options  to explore in order to avoid homelessness and manage  anticipated stress.  Courtney [name] stated "My anxiety is down to a 5/10 now.  This reminded me that I'm resilient and I've made it through shit that other people wouldn't survive.  I'm going to give myself the right amount of time to focus on problems like this and distract myself if I start focusing on scary things I can't control".  Courtney [name] reported that this also helped remind her that she has a Medicaid representative to outreach about housing assistance.  She reported that she would ensure time for self-care this week such as exercise, meditation, practicing gratitude, and listening to relaxing music.  Clinician will continue to monitor.       Plan: Meet again in 1 week virtually.   Diagnosis: Bipolar I Disorder, mixed, moderate; Borderline personality disorder; PTSD; Cannabis Use Disorder, moderate; and ADHD, combined type.   Collaboration of Care:   No collaboration required at this time.                                                   Patient/Guardian was advised Release of Information must be obtained prior to any record release in order to collaborate their care with an outside provider. Patient/Guardian was advised if they have not already done so to contact the registration department to sign all necessary forms in order for [name] to release information regarding their care.    Consent: Patient/Guardian gives verbal consent for treatment and assignment of benefits for services provided during this visit. Patient/Guardian expressed understanding and agreed to proceed.  [name], LCSW, LCAS 01/27/22

## 2022-02-03 ENCOUNTER — Ambulatory Visit (INDEPENDENT_AMBULATORY_CARE_PROVIDER_SITE_OTHER): Payer: No Typology Code available for payment source | Admitting: Licensed Clinical Social Worker

## 2022-02-03 DIAGNOSIS — F4310 Post-traumatic stress disorder, unspecified: Secondary | ICD-10-CM

## 2022-02-03 DIAGNOSIS — F902 Attention-deficit hyperactivity disorder, combined type: Secondary | ICD-10-CM

## 2022-02-03 DIAGNOSIS — F1220 Cannabis dependence, uncomplicated: Secondary | ICD-10-CM

## 2022-02-03 DIAGNOSIS — F603 Borderline personality disorder: Secondary | ICD-10-CM | POA: Diagnosis not present

## 2022-02-03 DIAGNOSIS — F3162 Bipolar disorder, current episode mixed, moderate: Secondary | ICD-10-CM | POA: Diagnosis not present

## 2022-02-10 ENCOUNTER — Ambulatory Visit (INDEPENDENT_AMBULATORY_CARE_PROVIDER_SITE_OTHER): Payer: No Typology Code available for payment source | Admitting: Licensed Clinical Social Worker

## 2022-02-10 DIAGNOSIS — F4310 Post-traumatic stress disorder, unspecified: Secondary | ICD-10-CM

## 2022-02-10 DIAGNOSIS — F603 Borderline personality disorder: Secondary | ICD-10-CM

## 2022-02-10 DIAGNOSIS — F3162 Bipolar disorder, current episode mixed, moderate: Secondary | ICD-10-CM

## 2022-02-10 DIAGNOSIS — F902 Attention-deficit hyperactivity disorder, combined type: Secondary | ICD-10-CM

## 2022-02-10 DIAGNOSIS — F1220 Cannabis dependence, uncomplicated: Secondary | ICD-10-CM

## 2022-02-10 NOTE — Progress Notes (Signed)
Virtual Visit via Video Note  I connected with Courtney [name] on 02/10/22 at 1:00pm by video enabled telemedicine application and verified that I am speaking with the correct person using two identifiers.   I discussed the limitations, risks, security and privacy concerns of performing an evaluation and management service by video and the availability of in person appointments. I also discussed with the patient that there may be a patient responsible charge related to this service. The patient expressed understanding and agreed to proceed.   I discussed the assessment and treatment plan with the patient. The patient was provided an opportunity to ask questions and all were answered. The patient agreed with the plan and demonstrated an understanding of the instructions.   The patient was advised to call back or seek an in-person evaluation if the symptoms worsen or if the condition fails to improve as anticipated.   I provided 47 minutes of non-face-to-face time during this encounter.   [name], LCSW, LCAS ________________________ THERAPIST PROGRESS NOTE   Session Time: 1:00pm - 1:47pm    Location: Patient: Patient Home Provider: Clinician Home Office   Participation Level: Active   Behavioral Response: Alert, casually dressed, anxious mood/affect   Type of Therapy:  Individual Therapy   Treatment Goals addressed: Depression and Anxiety management; Medication management; Monitoring substance use  Progress Towards Goals: Progressing   Interventions: CBT, relapse prevention   Summary: Courtney [name] is a 31 year old female that presented for therapy appointment and is diagnosed with Bipolar I Disorder, mixed, moderate; Borderline personality disorder; PTSD; Cannabis Use Disorder, moderate; and ADHD, combined type.        Suicidal/Homicidal: None; without intent or plan   Therapist Response: Clinician met with Courtney [name] for virtual therapy session and assessed for safety,  sobriety, and medication compliance.  Courtney [name] presented for appointment on time and was alert, oriented x5, with no evidence or self-report of active SI/HI or A/V H.  Courtney [name] reported that she has not used alcohol since last check-in.  Courtney [name] reported that she ended up taking "More shrooms than I ever have before" over the weekend with new friends.  Clinician inquired about Courtney [name]'s emotional ratings today, and whether she has had any significant changes in thoughts, feelings, or behavior since last check-in.  Courtney [name] reported scores of 4/10 for depression, 10/10 for anxiety, 5/10 for irritability and 0/10 for mania.  Courtney [name] denied any recent outbursts.  Courtney [name] reported that a recent struggle has been feeling overwhelmed and experiencing panic attacks daily, stating "I think its because I've been overstimulated".  Courtney [name] reported that this accumulation of stress and uncertainty led to the use mushrooms with her friends.  Clinician encouraged Courtney [name] to abstain from using hallucinogens due to the numerous previously discussed physical and mental consequences that could result.  Clinician encouraged Courtney [name] to be mindful of internal and external triggers which could entice her to see illicit substances as an appropriate coping mechanism, and reminded her of healthier coping alternatives, such as meditation, and grounding which could be utilized to help deal with stressors.  Courtney [name] acknowledged that she has been 'slacking' on use of heathy coping skills such as meditation and deep breathing, and when her new friends offered this to her the other day, she could not resist.  She reported that she has been dwelling on recent breakup, and uncertainty about where she will move next.  Clinician encouraged her to practice refusal skills in order to turn down future offers of substance use, and begin setting aside  time in her daily routine to practice coping skills, and write in her journal about  stressors in order to explore realistic solutions.  Clinician inquired about whether Courtney [name] has had any additional leads on securing transition to new housing following recent concerns about homelessness.  Courtney [name] reported that she consulted with some friends following last session, and they will move in together at the end of the month, but these are the same friends that are using illicit substances.  Clinician encouraged Courtney [name] to surround herself with sober peers that will not reinforce maladaptive behavior, and provided referral for free community mental health groups to engage in.  Intervention was effective, as evidenced by Courtney [name] reporting increased motivation towards practicing healthier coping skills again, more caution towards future use of substances, and alleviation of stress, stating "I do feel a little bit better, and I know that I need to make better choices like exercising, reading, and meditating".  Clinician will continue to monitor.       Plan: Meet again in 1 week virtually.   Diagnosis: Bipolar I Disorder, mixed, moderate; Borderline personality disorder; PTSD; Cannabis Use Disorder, moderate; and ADHD, combined type.   Collaboration of Care:   No collaboration required at this time.                                                   Patient/Guardian was advised Release of Information must be obtained prior to any record release in order to collaborate their care with an outside provider. Patient/Guardian was advised if they have not already done so to contact the registration department to sign all necessary forms in order for [name] to release information regarding their care.    Consent: Patient/Guardian gives verbal consent for treatment and assignment of benefits for services provided during this visit. Patient/Guardian expressed understanding and agreed to proceed.  [name], LCSW, LCAS 02/10/22

## 2022-02-17 ENCOUNTER — Encounter (HOSPITAL_COMMUNITY): Payer: Self-pay

## 2022-02-17 ENCOUNTER — Ambulatory Visit (HOSPITAL_COMMUNITY): Payer: No Typology Code available for payment source | Admitting: Licensed Clinical Social Worker

## 2022-03-03 ENCOUNTER — Ambulatory Visit (INDEPENDENT_AMBULATORY_CARE_PROVIDER_SITE_OTHER): Payer: No Typology Code available for payment source | Admitting: Licensed Clinical Social Worker

## 2022-03-03 DIAGNOSIS — F3162 Bipolar disorder, current episode mixed, moderate: Secondary | ICD-10-CM

## 2022-03-03 DIAGNOSIS — F603 Borderline personality disorder: Secondary | ICD-10-CM | POA: Diagnosis not present

## 2022-03-03 DIAGNOSIS — F1220 Cannabis dependence, uncomplicated: Secondary | ICD-10-CM | POA: Diagnosis not present

## 2022-03-03 DIAGNOSIS — F4310 Post-traumatic stress disorder, unspecified: Secondary | ICD-10-CM | POA: Diagnosis not present

## 2022-03-03 DIAGNOSIS — F902 Attention-deficit hyperactivity disorder, combined type: Secondary | ICD-10-CM

## 2022-03-03 NOTE — Progress Notes (Signed)
Virtual Visit via Telephone Note  I connected with Courtney [name] on 03/03/22 at 3:00pm by telephone and verified that I am speaking with the correct person using two identifiers.   I discussed the limitations, risks, security and privacy concerns of performing an evaluation and management service by telephone and the availability of in person appointments. I also discussed with the patient that there may be a patient responsible charge related to this service. The patient expressed understanding and agreed to proceed.   I discussed the assessment and treatment plan with the patient. The patient was provided an opportunity to ask questions and all were answered. The patient agreed with the plan and demonstrated an understanding of the instructions.   The patient was advised to call back or seek an in-person evaluation if the symptoms worsen or if the condition fails to improve as anticipated.   I provided 1 hour of non-face-to-face time during this encounter.   Courtney Stain, LCSW, LCAS ________________________ THERAPIST PROGRESS NOTE   Session Time: 3:00pm - 3:45pm   Location: Patient: Patient Home Provider: [facility]   Participation Level: Active   Behavioral Response: Alert, manic mood   Type of Therapy:  Individual Therapy   Treatment Goals addressed: Depression and Anxiety management; Medication management; Monitoring substance use; Safety planning   Progress Towards Goals: Ongoing   Interventions: CBT, relapse prevention, mood regulation techniques, safety planning     Summary: Courtney [name] is a 31 year old female that presented for therapy appointment and is diagnosed with Bipolar I Disorder, mixed, moderate; Borderline personality disorder; PTSD; Cannabis Use Disorder, moderate; and ADHD, combined type.        Suicidal/Homicidal: None; without intent or plan   Therapist Response: Clinician spoke with Courtney [name] for today's therapy appointment via telephone, as she  was unable to engage in video session due to poor connection.  Clinician assessed for safety, sobriety, and medication compliance.  Courtney [name] was alert, oriented x5, with no evidence or self-report of active SI/HI or A/V H.  Courtney [name] reported that she stopped taking all behavioral medications 5 days ago, stating "I don't want to take pills anymore so I stopped cold [place].  I feel like they have been suppressing my feelings".  Clinician encouraged Courtney [name] to speak with her provider about decision to discontinue medication and reminded her of previous patterns of mood instability when missing doses.  Courtney [name] reported that she has not used alcohol, but has been "Microdosing mushrooms" 2-3x per week since last check-in, stating "Its helping me emotionally".  Clinician reminded Courtney [name] of risks involved in taking these illicit substances and encouraged her to abstain and avoid reinforcing pattern of self-medication.  Clinician inquired about Courtney [name]'s current emotional ratings, and whether she has had any significant changes in thoughts, feelings, or behavior since previous check-in.  Courtney [name] reported scores of 0/10 for depression, 1/10 for anxiety, 4/10 for anger/irritability and 0/10 for mania.  Courtney [name] denied any recent outbursts and panic attacks.  Courtney [name] reported that she still doesn't know where she will be living in 2.5 weeks, but is not very concerned about this, stating "Something will work out.  I'm trying not to worry about it today".  Clinician detected several manic symptoms during this conversation with Courtney [name], including grandiosity, pressured speech, flight of ideas, distractibility, increase in goal directed behavior, and decreased inhibition toward substance use.  Courtney [name] denied being manic at this time when these symptoms were pointed out, and became combative at first, suggesting that "I have never been more  sure of the decisions I am making in my life, and I know that it could  come off as impulsive, but I am the driver of my own car and trust myself". Courtney [name] reported that she is exploring holistic medicine now, and conversation was directed towards cost/benefit analysis regarding whether to continue with conventional therapy or seek discharge from care.  Courtney [name] reported that she wishes to continue with therapy at this time due to trust she has in this current professional, but she will not deviate from current course of medication non-compliance and self-medication with hallucinogens.  Clinician encouraged Courtney [name] to be mindful of changing severity in manic symptoms over following week and recommended several techniques for monitoring and intervening appropriately, including daily mood check-ins via journaling to track progression, speaking with her medical provider, getting adequate sleep, avoiding stimulants, being mindful of triggers, and utilizing relaxation techniques learned from therapy.  Clinician also informed Courtney [name] that pattern of non compliance with treatment plan goals could lead to discharge, and offered to explore alternative therapists if she chooses.  Courtney [name] reported that she would monitor for mania and use techniques offered to reduce risk to health and safety.  She expressed understanding towards policy of non-compliance and reported that she may explore holistic therapy.  Clinician also encouraged Courtney [name] to seek voluntary hospitalization should symptoms worsen and pose safety risk towards herself or others due to development of SI/HI.  Courtney [name] reported that she could contract for safety at this time and would follow safety plan.  She completed a CSS[employer] screening today negative for self-harm and/or SI. Clinician will continue to monitor.       Plan: Meet again in 1 week virtually.   Diagnosis: Bipolar I Disorder, mixed, moderate; Borderline personality disorder; PTSD; Cannabis Use Disorder, moderate; and ADHD, combined type.   Collaboration of  Care:   No collaboration required at this time.                                                   Patient/Guardian was advised Release of Information must be obtained prior to any record release in order to collaborate their care with an outside provider. Patient/Guardian was advised if they have not already done so to contact the registration department to sign all necessary forms in order for [name] to release information regarding their care.    Consent: Patient/Guardian gives verbal consent for treatment and assignment of benefits for services provided during this visit. Patient/Guardian expressed understanding and agreed to proceed.  Courtney Stain, LCSW, LCAS 03/03/22

## 2022-03-17 ENCOUNTER — Ambulatory Visit (INDEPENDENT_AMBULATORY_CARE_PROVIDER_SITE_OTHER): Payer: No Typology Code available for payment source | Admitting: Licensed Clinical Social Worker

## 2022-03-17 DIAGNOSIS — F4310 Post-traumatic stress disorder, unspecified: Secondary | ICD-10-CM | POA: Diagnosis not present

## 2022-03-17 DIAGNOSIS — F3162 Bipolar disorder, current episode mixed, moderate: Secondary | ICD-10-CM

## 2022-03-17 DIAGNOSIS — F603 Borderline personality disorder: Secondary | ICD-10-CM | POA: Diagnosis not present

## 2022-03-17 DIAGNOSIS — F1220 Cannabis dependence, uncomplicated: Secondary | ICD-10-CM | POA: Diagnosis not present

## 2022-03-17 DIAGNOSIS — F902 Attention-deficit hyperactivity disorder, combined type: Secondary | ICD-10-CM

## 2022-03-17 NOTE — Progress Notes (Signed)
Virtual Visit via Video Note  I connected with Courtney [name] on 03/17/22 at 3:00pm by video enabled telemedicine application and verified that I am speaking with the correct person using two identifiers.   I discussed the limitations, risks, security and privacy concerns of performing an evaluation and management service by telephone and the availability of in person appointments. I also discussed with the patient that there may be a patient responsible charge related Courtney [name] this service. The patient expressed understanding and agreed Courtney [name] proceed.   I discussed the assessment and treatment plan with the patient. The patient was provided an opportunity Courtney [name] ask questions and all were answered. The patient agreed with the plan and demonstrated an understanding of the instructions.   The patient was advised Courtney [name] call back or seek an in-person evaluation if the symptoms worsen or if the condition fails Courtney [name] improve as anticipated.   I provided 50 minutes of non-face-Courtney [name]-face time during this encounter.   Courtney Flood, LCSW, LCAS ________________________ THERAPIST PROGRESS NOTE   Session Time: 3:00pm - 3:50pm   Location: Patient: Patient Home Provider: Clinical Home Office   Participation Level: Active   Behavioral Response: Alert, manic mood/affect   Type of Therapy:  Individual Therapy   Treatment Goals addressed: Depression and Anxiety management; Medication management; Monitoring substance use; Safety planning   Progress Towards Goals: Revised    Interventions: CBT, problem solving, safety planning, referrals for therapists     Summary: Courtney [name] is a 31 year old female that presented for therapy appointment and is diagnosed with Bipolar I Disorder, mixed, moderate; Borderline personality disorder; PTSD; Cannabis Use Disorder, moderate; and ADHD, combined type.        Suicidal/Homicidal: None; without intent or plan   Therapist Response: Clinician met with Courtney [name] for virtual therapy  session today and assessed for safety, sobriety, and medication compliance.  Courtney [name] presented Courtney [name] appointment on time and was alert, oriented x5, with no evidence or self-report of active SI/HI or A/V H.  Courtney [name] reported that she has remained noncompliant with all prescribed behavioral medications but attempted Courtney [name] get in touch with her provider today Courtney [name] discuss process of restarting.  Courtney [name] reported that she has "Not used much" in regard Courtney [name] alcohol and illicit substances, but would not clarify further regarding what or how much she is using.  Clinician inquired about Courtney [name]'s emotional ratings today, and whether she has had any significant changes in thoughts, feelings, or behavior since last check-in.  Courtney [name] reported scores of 7/10 for depression, 7/10 for anxiety, 7/10 for anger/irritability and 7/10 for mania.  Courtney [name] denied any recent outbursts and panic attacks.  Courtney [name] reported that she has been feeling very depressed and hopeless lately, and due Courtney [name] uncertainty about where she would live at the end of the month, she began looking for new friends online.  Courtney [name] reported that she began speaking Courtney [name] someone in [place], and made the decision Courtney [name] relocate there over the course of the past week.  Clinician assisted Courtney [name] in running a cost benefit analysis regarding whether this was a sound decision, and not one influenced by increased mania since discontinuing medication.  Courtney [name] participated in analysis and reported that she feels like she can trust this person, as she has spoken with him at length on the phone as well as his parents, and there have been no red flags present, stating "He is very reassuring and kind".  Courtney [name] reported that she looks at this transition as an opportunity Courtney [name] start fresh, and establish  boundaries with some toxic family and friends.  Clinician informed Courtney [name] of licensure limitations and the need Courtney [name] secure a new therapist if she decides Courtney [name] permanently  relocate Courtney [name] [place].  Courtney [name] reported that although she considers this a significant loss, she is confident in her decision Courtney [name] move, and has already informed Medicaid, so she will begin trying Courtney [name] locate a new doctor Courtney [name] manage medications as well.  Clinician informed Courtney [name] that future appointments on calendar will be cancelled and she will be discharged from our practice in 30 days unless she informs [name] of change in plans.  Clinician shared online resources with Courtney [name] for locating a qualified therapist serving the area she will relocate Courtney [name] [facility]).  Courtney [name] reported understanding of discharge policy and expressed receptiveness Courtney [name] the therapist locator tool on [employer], noting that there appeared Courtney [name] be several trauma professionals she will plan Courtney [name] outreach this week.  Clinician revisited safety plan with Courtney [name] today Courtney [name] make revisions as needed ahead of transition Courtney [name] new state, including potential triggers/stressors Courtney [name] be mindful of, coping skills learned from therapy Courtney [name] practice, supports she can outreach for help, and professional resources such as the 988 hotline for crisis intervention.  Courtney [name] participated in crisis planning and reported that it was helpful Courtney [name] revisit this plan and make updates, as this put her mind at ease about upcoming decision.  Courtney [name] thanked clinician for assistance provided during her time in therapy with our practice, and agreed Courtney [name] keep [name] informed if anything changes. Clinician will continue Courtney [name] monitor until discharge is confirmed.       Plan: Courtney [name] will move Courtney [name] [place], locate a new care team, and continue therapy with new provider unless our office is notified of change of plans within 30 days.       Diagnosis: Bipolar I Disorder, mixed, moderate; Borderline personality disorder; PTSD; Cannabis Use Disorder, moderate; and ADHD, combined type.   Collaboration of Care:   No collaboration required at this time.                                                    Patient/Guardian was advised Release of Information must be obtained prior Courtney [name] any record release in order Courtney [name] collaborate their care with an outside provider. Patient/Guardian was advised if they have not already done so Courtney [name] contact the registration department Courtney [name] sign all necessary forms in order for [name] Courtney [name] release information regarding their care.    Consent: Patient/Guardian gives verbal consent for treatment and assignment of benefits for services provided during this visit. Patient/Guardian expressed understanding and agreed Courtney [name] proceed.  Courtney Flood, LCSW, LCAS 03/17/22

## 2022-03-24 ENCOUNTER — Ambulatory Visit (HOSPITAL_COMMUNITY): Payer: No Typology Code available for payment source | Admitting: Licensed Clinical Social Worker

## 2022-05-12 ENCOUNTER — Ambulatory Visit (INDEPENDENT_AMBULATORY_CARE_PROVIDER_SITE_OTHER): Payer: No Typology Code available for payment source | Admitting: Licensed Clinical Social Worker

## 2022-05-12 DIAGNOSIS — F3162 Bipolar disorder, current episode mixed, moderate: Secondary | ICD-10-CM | POA: Diagnosis not present

## 2022-05-12 DIAGNOSIS — F4310 Post-traumatic stress disorder, unspecified: Secondary | ICD-10-CM

## 2022-05-12 DIAGNOSIS — F1220 Cannabis dependence, uncomplicated: Secondary | ICD-10-CM | POA: Diagnosis not present

## 2022-05-12 DIAGNOSIS — F902 Attention-deficit hyperactivity disorder, combined type: Secondary | ICD-10-CM

## 2022-05-12 DIAGNOSIS — F603 Borderline personality disorder: Secondary | ICD-10-CM | POA: Diagnosis not present

## 2022-05-12 NOTE — Progress Notes (Signed)
Virtual Visit via Video Note  I connected with Courtney [name] on 05/12/22 at 3:00pm by video enabled telemedicine application and verified that I am speaking with the correct person using two identifiers.   I discussed the limitations, risks, security and privacy concerns of performing an evaluation and management service by video and the availability of in person appointments. I also discussed with the patient that there may be a patient responsible charge related to this service. The patient expressed understanding and agreed to proceed.   I discussed the assessment and treatment plan with the patient. The patient was provided an opportunity to ask questions and all were answered. The patient agreed with the plan and demonstrated an understanding of the instructions.   The patient was advised to call back or seek an in-person evaluation if the symptoms worsen or if the condition fails to improve as anticipated.   I provided 35 minutes of non-face-to-face time during this encounter.   [name], LCSW, LCAS ________________________ THERAPIST PROGRESS NOTE   Session Time: 3:00pm - 3:35pm     Location: Patient: Patient Home Provider: Clinician Home Office   Participation Level: Active   Behavioral Response: Alert, casually dressed, anxious mood/affect   Type of Therapy:  Individual Therapy   Treatment Goals addressed: Depression and Anxiety management; Medication management; Monitoring substance use; Seeking trauma therapist   Progress Towards Goals: Progressing   Interventions: CBT, problem solving    Summary: Courtney [name] is a 31 year old female that presented for therapy appointment and is diagnosed with Bipolar I Disorder, mixed, moderate; Borderline personality disorder; PTSD; Cannabis Use Disorder, moderate; and ADHD, combined type.        Suicidal/Homicidal: None; without intent or plan   Therapist Response: Clinician met with Courtney [name] for virtual therapy appointment  and assessed for safety, sobriety, and medication compliance.  Courtney [name] presented for session on time and was alert, oriented x5, with no evidence or self-report of active SI/HI or A/V H.  Courtney [name] reported that Courtney [name] has used synthetic OTC THC a few times since our last check-in, stating "Pot has been hard to get.  I know I shouldn't be doing anything right now but I'm just getting back on my medications and needed something to help in the meantime".  Clinician inquired about Courtney [name] emotional ratings today, and whether Courtney [name] has had any significant changes in thoughts, feelings, or behavior since last check-in.  Courtney [name] reported scores of 4/10 for depression, 6/10 for anxiety, 0/10 for anger/irritability and 0/10 for mania.  Courtney [name] denied any recent outbursts or panic attacks.  Courtney [name] reported that Courtney [name] moved back to [place] after staying in [place] for several weeks, stating "Things went super bad in [place].  Ill catch you up on everything next time because I don't have much time today".  Clinician inquired about where Courtney [name] is currently staying, if Courtney [name] is safe, and plans to stay within [place] due to limitations of licensure.  Courtney [name] reported that Courtney [name] is living in a safe place with a friend and does not plan to move from [place] again.  Courtney [name] reported that Courtney [name] reestablished care with her psychiatrist following an appointment today to restart medication, and plans to begin attending individual therapy weekly again as Courtney [name] seeks stability.  Courtney [name] stated "I've decided I'm not okay without my medication.  I wasn't ready to go off it and found out the hard way".  Clinician praised Courtney [name] for restarting medication and prioritizing medical appointments.  Courtney [name] reported that due to recent instability, Courtney [name]  has been thinking more about doing trauma therapy, as Courtney [name] noticed moments of dissociation under stress, and feels that awareness of trauma triggers would be beneficial.  Clinician reminded Courtney [name] that of  inability to treat PTSD due to lack of specific training, but offered to share resources with her today which could assist in linking with an appropriate professional.  Clinician showed Courtney [name] the therapist search tool on [employer] Today's website, and ability to sort by specific specialty, such as trauma certified interventions (i.e. EMDR).  Clinician assisted Courtney [name] in weighing pros and cons of various professionals that were discovered in online search to create a list to outreach over following week.  Progress is evidenced by Courtney [name] reengaging in therapy and psychiatry appointments again following move back to [place], restarting medications, and showing motivation to link with a trauma professional to begin treatment of PTSD specific symptoms. Courtney [name] stated "I don't want to push away people like I have in the past".  Clinician will continue to monitor.       Plan: Meet again in 1 week virtually.   Diagnosis: Bipolar I Disorder, mixed, moderate; Borderline personality disorder; PTSD; Cannabis Use Disorder, moderate; and ADHD, combined type.   Collaboration of Care:   No collaboration required at this time.                                                   Patient/Guardian was advised Release of Information must be obtained prior to any record release in order to collaborate their care with an outside provider. Patient/Guardian was advised if they have not already done so to contact the registration department to sign all necessary forms in order for [name] to release information regarding their care.    Consent: Patient/Guardian gives verbal consent for treatment and assignment of benefits for services provided during this visit. Patient/Guardian expressed understanding and agreed to proceed.  [name], LCSW, LCAS 05/12/22

## 2022-05-21 ENCOUNTER — Ambulatory Visit (INDEPENDENT_AMBULATORY_CARE_PROVIDER_SITE_OTHER): Payer: No Typology Code available for payment source | Admitting: Licensed Clinical Social Worker

## 2022-05-21 DIAGNOSIS — F4310 Post-traumatic stress disorder, unspecified: Secondary | ICD-10-CM | POA: Diagnosis not present

## 2022-05-21 DIAGNOSIS — F902 Attention-deficit hyperactivity disorder, combined type: Secondary | ICD-10-CM

## 2022-05-21 DIAGNOSIS — F1220 Cannabis dependence, uncomplicated: Secondary | ICD-10-CM | POA: Diagnosis not present

## 2022-05-21 DIAGNOSIS — F3162 Bipolar disorder, current episode mixed, moderate: Secondary | ICD-10-CM | POA: Diagnosis not present

## 2022-05-21 DIAGNOSIS — F603 Borderline personality disorder: Secondary | ICD-10-CM

## 2022-05-21 NOTE — Progress Notes (Signed)
Virtual Visit via Video Note  I connected with Courtney [name] on 05/21/22 at 3:00pm by video enabled telemedicine application and verified that I am speaking with the correct person using two identifiers.   I discussed the limitations, risks, security and privacy concerns of performing an evaluation and management service by video and the availability of in person appointments. I also discussed with the patient that there may be a patient responsible charge related to this service. The patient expressed understanding and agreed to proceed.   I discussed the assessment and treatment plan with the patient. The patient was provided an opportunity to ask questions and all were answered. The patient agreed with the plan and demonstrated an understanding of the instructions.   The patient was advised to call back or seek an in-person evaluation if the symptoms worsen or if the condition fails to improve as anticipated.   I provided 1 hour of non-face-to-face time during this encounter.   [name], LCSW, LCAS ________________________ THERAPIST PROGRESS NOTE   Session Time: 3:00pm - 4:00pm    Location: Patient: Patient Home Provider: [facility] [place] Office   Participation Level: Active   Behavioral Response: Alert, casually dressed, euthymic mood/affect     Type of Therapy:  Individual Therapy   Treatment Goals addressed: Depression and Anxiety management; Medication management; Monitoring substance use   Progress Towards Goals: Progressing    Interventions: CBT, psychoeducation on risks of cocaine use, relapse prevention      Summary: Courtney [name] is a 31 year old female that presented for therapy appointment and is diagnosed with Bipolar I Disorder, mixed, moderate; Borderline personality disorder; PTSD; Cannabis Use Disorder, moderate; and ADHD, combined type.        Suicidal/Homicidal: None; without intent or plan   Therapist Response: Clinician met with Courtney [name] for virtual therapy  session and assessed for safety, sobriety, and medication compliance.  Courtney [name] presented for appointment on time and was alert, oriented x5, with no evidence or self-report of active SI/HI or A/V H.  Courtney [name] reported compliance with medications, but continues smoking synthetic marijuana "Every now and then", in addition to oral cocaine use x2-3 times per week.  Clinician inquired about Courtney [name]'s current emotional ratings, and whether she has had any significant changes in thoughts, feelings, or behavior since previous check-in.  Courtney [name] reported scores of 0/10 for depression, 0/10 for anxiety, 0/10 for anger/irritability and 0/10 for mania.  Courtney [name] denied any recent outbursts or panic attacks. Courtney [name] reported that a recent success was outreaching a trauma therapist, although she hasn't received a response from them yet about beginning treatment.  Clinician inquired about when Courtney [name] began using cocaine again.  Courtney [name] reported that she made a friend a few weeks ago who has access to it, and provides it to her for free, and she enjoys the energy it gives her.  Clinician encouraged Courtney [name] to abstain from illicit substance use, and provided psychoeducation on the various psychological and physical side effects that could result from continued use of cocaine.  This included both short and long term effects such as chest pain, raised blood pressure, risk of heart attack, respiratory issues, strokes, seizures, mood fluctuation, interference with effectiveness of behavioral medications, and paranoia.  Clinician encouraged Courtney [name] to consider substituting cocaine use for healthier coping skills, such as exercise, journaling, practice of grounding and relaxation skills that have been previously offered.  Intervention was effective, as evidenced by Courtney [name]'s active engagement in discussion on this problem, and acknowledgement that use of this substance poses  significant risks to her wellbeing.   Courtney [name] stated "I didn't know it could do all that.  I'm trying to take care of myself and this is probably killing me.  For all I know that is fentanyl in there and even though I don't have to pay for it, I need to remember there are other prices you have to pay".  Clinician will continue to monitor.  Plan: Meet again in 1 week virtually.   Diagnosis: Bipolar I Disorder, mixed, moderate; Borderline personality disorder; PTSD; Cannabis Use Disorder, moderate; and ADHD, combined type.   Collaboration of Care:   No collaboration required at this time.                                                   Patient/Guardian was advised Release of Information must be obtained prior to any record release in order to collaborate their care with an outside provider. Patient/Guardian was advised if they have not already done so to contact the registration department to sign all necessary forms in order for [name] to release information regarding their care.    Consent: Patient/Guardian gives verbal consent for treatment and assignment of benefits for services provided during this visit. Patient/Guardian expressed understanding and agreed to proceed.  [name], LCSW, LCAS 05/21/22

## 2022-05-27 ENCOUNTER — Ambulatory Visit (HOSPITAL_COMMUNITY): Payer: No Typology Code available for payment source | Admitting: Licensed Clinical Social Worker

## 2022-05-27 ENCOUNTER — Encounter (HOSPITAL_COMMUNITY): Payer: Self-pay

## 2022-06-02 ENCOUNTER — Ambulatory Visit (INDEPENDENT_AMBULATORY_CARE_PROVIDER_SITE_OTHER): Payer: No Typology Code available for payment source | Admitting: Licensed Clinical Social Worker

## 2022-06-02 DIAGNOSIS — F1220 Cannabis dependence, uncomplicated: Secondary | ICD-10-CM

## 2022-06-02 DIAGNOSIS — F603 Borderline personality disorder: Secondary | ICD-10-CM | POA: Diagnosis not present

## 2022-06-02 DIAGNOSIS — F3162 Bipolar disorder, current episode mixed, moderate: Secondary | ICD-10-CM

## 2022-06-02 DIAGNOSIS — F4310 Post-traumatic stress disorder, unspecified: Secondary | ICD-10-CM

## 2022-06-02 DIAGNOSIS — F902 Attention-deficit hyperactivity disorder, combined type: Secondary | ICD-10-CM

## 2022-06-02 NOTE — Progress Notes (Signed)
Virtual Visit via Video Note  I connected with Jalesha Plotz on 06/02/22 at 3:00pm by video enabled telemedicine application and verified that I am speaking with the correct person using two identifiers.   I discussed the limitations, risks, security and privacy concerns of performing an evaluation and management service by video and the availability of in person appointments. I also discussed with the patient that there may be a patient responsible charge related to this service. The patient expressed understanding and agreed to proceed.   I discussed the assessment and treatment plan with the patient. The patient was provided an opportunity to ask questions and all were answered. The patient agreed with the plan and demonstrated an understanding of the instructions.   The patient was advised to call back or seek an in-person evaluation if the symptoms worsen or if the condition fails to improve as anticipated.   I provided 30 minutes of non-face-to-face time during this encounter.   [name], LCSW, LCAS ________________________ THERAPIST PROGRESS NOTE   Session Time: 3:00pm - 3:30pm     Location: Patient: Patient Home Provider: Clinical Home Office    Participation Level: Active   Behavioral Response: Alert, casually dressed, depressed mood/affect     Type of Therapy:  Individual Therapy   Treatment Goals addressed: Depression and Anxiety management; Medication management; Monitoring substance use; Improving communication skills    Progress Towards Goals: Progressing    Interventions: CBT, communication skills      Summary: Annalysse Shoemaker is a 31 year old female that presented for therapy appointment and is diagnosed with Bipolar I Disorder, mixed, moderate; Borderline personality disorder; PTSD; Cannabis Use Disorder, moderate; and ADHD, combined type.        Suicidal/Homicidal: None; without intent or plan   Therapist Response: Clinician met with [name] for virtual  therapy appointment and assessed for safety, sobriety, and medication compliance.  Hye presented for session on time and was alert, oriented x5, with no evidence or self-report of active SI/HI or A/V H.  Sanam reported compliance with medications.  Adrienne reported that she last used marijuana the night before to help fall asleep, and '2 bumps' of cocaine 2 days ago with a friend, although she is attempting to cut back due to recent conversation with clinician about risks.  Clinician inquired about Omolara's current emotional ratings, and whether she has had any significant changes in thoughts, feelings, or behavior since last check-in.  Zissy reported scores of 2/10 for depression, 0/10 for anxiety, 1/10 for irritability and 0/10 for mania.  Shiron denied any recent outbursts or panic attacks. Lalitha reported that a recent success has been noticing lower emotional volatility since restarting medication.  She reported that a recent challenge has been lack of communication in current relationship since moving away from partner in [place].  Clinician reviewed material with Lakeeta today on communication skills which could be utilized to increase understanding and support within the relationship.  Clinician presented a handout on 'soft startups' which offered suggestions on how Hatice could address a problem assertively with her partner, including tips such as choosing an appropriate time/setting, being mindful of maintaining gentle tone, volume and language, while avoiding triggering nonverbals such as rolling eyes, as well as utilizing "I" statements to express feelings, focusing on one problem at a time, and being respectful.  Intervention was effective, as evidenced by [name] actively engaging in discussion on subject, and expressing receptiveness to several suggestions offered, including being more mindful of her emotional state before starting a  serious conversation, practicing  relaxation skills in order to avoid outbursts, and avoiding critical statements which could cause the other person to become defensive.  Clinician will continue to monitor.   Plan: Meet again in 1 week virtually.   Diagnosis: Bipolar I Disorder, mixed, moderate; Borderline personality disorder; PTSD; Cannabis Use Disorder, moderate; and ADHD, combined type.   Collaboration of Care:   No collaboration required at this time.                                                   Patient/Guardian was advised Release of Information must be obtained prior to any record release in order to collaborate their care with an outside provider. Patient/Guardian was advised if they have not already done so to contact the registration department to sign all necessary forms in order for [name] to release information regarding their care.    Consent: Patient/Guardian gives verbal consent for treatment and assignment of benefits for services provided during this visit. Patient/Guardian expressed understanding and agreed to proceed.  [name], Ken Caryl, LCAS 06/02/22

## 2022-06-09 ENCOUNTER — Ambulatory Visit (INDEPENDENT_AMBULATORY_CARE_PROVIDER_SITE_OTHER): Payer: No Typology Code available for payment source | Admitting: Licensed Clinical Social Worker

## 2022-06-09 DIAGNOSIS — F603 Borderline personality disorder: Secondary | ICD-10-CM | POA: Diagnosis not present

## 2022-06-09 DIAGNOSIS — F4310 Post-traumatic stress disorder, unspecified: Secondary | ICD-10-CM | POA: Diagnosis not present

## 2022-06-09 DIAGNOSIS — F3162 Bipolar disorder, current episode mixed, moderate: Secondary | ICD-10-CM | POA: Diagnosis not present

## 2022-06-09 DIAGNOSIS — F902 Attention-deficit hyperactivity disorder, combined type: Secondary | ICD-10-CM

## 2022-06-09 DIAGNOSIS — F1220 Cannabis dependence, uncomplicated: Secondary | ICD-10-CM

## 2022-06-09 NOTE — Progress Notes (Signed)
Virtual Visit via Video Note  I connected with Courtney [name] on 06/09/22 at 3:00pm by video enabled telemedicine application and verified that I am speaking with the correct person using two identifiers.   I discussed the limitations, risks, security and privacy concerns of performing an evaluation and management service by video and the availability of in person appointments. I also discussed with the patient that there may be a patient responsible charge related to this service. The patient expressed understanding and agreed to proceed.   I discussed the assessment and treatment plan with the patient. The patient was provided an opportunity to ask questions and all were answered. The patient agreed with the plan and demonstrated an understanding of the instructions.   The patient was advised to call back or seek an in-person evaluation if the symptoms worsen or if the condition fails to improve as anticipated.   I provided 30 minutes of non-face-to-face time during this encounter.   [name], LCSW, LCAS ________________________ THERAPIST PROGRESS NOTE   Session Time: 3:00pm - 3:30pm     Location: Patient: Patient Home Provider: Clinical Home Office    Participation Level: Active   Behavioral Response: Alert, casually dressed, fatigued mood/affect     Type of Therapy:  Individual Therapy   Treatment Goals addressed: Depression and Anxiety management; Medication management; Monitoring substance use; Improving communication skills   Progress Towards Goals: Progressing    Interventions: CBT, treatment planning    Summary: Courtney [name] is a 31 year old female that presented for therapy appointment and is diagnosed with Bipolar I Disorder, mixed, moderate; Borderline personality disorder; PTSD; Cannabis Use Disorder, moderate; and ADHD, combined type.        Suicidal/Homicidal: None; without intent or plan   Therapist Response: Clinician met with Courtney [name] for virtual  therapy session and assessed for safety, sobriety, and medication compliance.  Courtney [name] presented for appointment on time and was alert, oriented x5, with no evidence or self-report of active SI/HI or A/V H.  Courtney [name] reported compliance with medications.  Courtney [name] reported that she last used marijuana 3 days ago, "3 sniffs" of cocaine 2 days ago, and "A couple of beers a few days ago".  She acknowledged that ongoing polysubstance use has likely affected energy levels, as her sleep has been poor and she is staying up late most nights hanging out with friends.  Clinician reminded Courtney [name] of the previously noted physical and mental consequences that could result from ongoing substance use.  Clinician inquired about Courtney [name]'s emotional ratings today, and whether she has had any significant changes in thoughts, feelings, or behavior since previous check-in.  Courtney [name] reported scores of 0/10 for depression, 0/10 for anxiety, 1/10 for irritability and 0/10 for mania.  Courtney [name] denied any recent outbursts or panic attacks. 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  She reported that she did not have any pressing issues to cover today for therapy, so clinician recommended revisiting treatment plan to identify progress and barriers to success, which she was agreeable to.  Clinician collaborated with Courtney [name] to make updates as follows with her verbal consent:  Meet with clinician virtually once every two weeks for therapy to address ongoing progress towards goals and any barriers to success; Meet with psychiatrist once per month to address efficacy of medication and make adjustments as needed to regimen and/or dosage; Take medication daily as prescribed to reduce symptoms and improve overall daily functioning, setting daily  reminder on phone for further reinforcement; Reduce depression from average severity of 3/10 down to 1/10 over the next 90  days by engaging in positive self-care activities for 2 hours each day, such as listening to music, watching movies, playing video games, doing makeup, cutting hair, and/or reading; Reduce anxiety from average severity of 3/10 down to 1/10, as well as maintain average panic attacks at 0 per month via utilization of 3-4 relaxation techniques daily such as mindful breathing, meditation, progressive muscle relaxation, grounding techniques, and/or positive visualizations; Continue working to become more independent in daily activities in order to increase sense of self-reliance, confidence, and curb dependency on other people; Identify 2-3 issues with communication skills that can be addressed to improve socialization with supports and reduce borderline tendencies within next 90 days;  Write 1 page at a minimum in journal each day regarding thoughts, feelings, and behaviors that arise, with goal of better understanding mood changes for implementation of appropriate copings skills ahead of time ("I want to observe, but not absorb other people's feelings like empaths do"); Exercise 4-5 times per week for 1 hour at a minimum each visit to improve both physical and mental well-being; Monitor substance use closely and consider engagement in [place] for assistance in addressing upon mental health, motivation, and/or other goal progress; Identify a qualified trauma professional to begin seeing biweekly in order to receive treatment for PTSD symptoms within next 90 days; Voluntarily seek hospitalization with assistance from support system and/or medical professionals should SI/HI appear and safety of self or others is determined at risk due to development of intent, plan, and access to means necessary to carry out plan.  Progress is evidenced by Courtney [name] consistently following up for therapy and psychiatry appointments following decision to avoid relocating to [place] in past month, reporting reduction in overall depression and  anxiety severity levels, improving medication compliance upon return to [employer], and efforts to outreach trauma specialist to work on PTSD symptoms.  Sullivan acknowledged that she would benefit from cutting back on substance use due to consequences that could result from dependence, although she is not motivated to seek additional resources such as substance abuse/recovery groups.  She also reported that she is no longer working part-time despite income issues, and would benefit from getting into a more active exercise routine again to ensure stress outlet.  Clinician will continue to monitor.       Plan: Meet again in 2 weeks virtually.   Diagnosis: Bipolar I Disorder, mixed, moderate; Borderline personality disorder; PTSD; Cannabis Use Disorder, moderate; and ADHD, combined type.   Collaboration of Care:   No collaboration required at this time.                                                   Patient/Guardian was advised Release of Information must be obtained prior to any record release in order to collaborate their care with an outside provider. Patient/Guardian was advised if they have not already done so to contact the registration department to sign all necessary forms in order for [name] to release information regarding their care.    Consent: Patient/Guardian gives verbal consent for treatment and assignment of benefits for services provided during this visit. Patient/Guardian expressed understanding and agreed to proceed.  [name], LCSW, LCAS 06/09/22

## 2022-06-16 ENCOUNTER — Ambulatory Visit (HOSPITAL_COMMUNITY): Payer: No Typology Code available for payment source | Admitting: Licensed Clinical Social Worker

## 2022-06-23 ENCOUNTER — Ambulatory Visit (HOSPITAL_COMMUNITY): Payer: No Typology Code available for payment source | Admitting: Licensed Clinical Social Worker

## 2022-06-24 ENCOUNTER — Ambulatory Visit (INDEPENDENT_AMBULATORY_CARE_PROVIDER_SITE_OTHER): Payer: No Typology Code available for payment source | Admitting: Licensed Clinical Social Worker

## 2022-06-24 DIAGNOSIS — F603 Borderline personality disorder: Secondary | ICD-10-CM

## 2022-06-24 DIAGNOSIS — F902 Attention-deficit hyperactivity disorder, combined type: Secondary | ICD-10-CM

## 2022-06-24 DIAGNOSIS — F3162 Bipolar disorder, current episode mixed, moderate: Secondary | ICD-10-CM

## 2022-06-24 DIAGNOSIS — F4310 Post-traumatic stress disorder, unspecified: Secondary | ICD-10-CM

## 2022-06-24 DIAGNOSIS — F1220 Cannabis dependence, uncomplicated: Secondary | ICD-10-CM | POA: Diagnosis not present

## 2022-06-24 NOTE — Progress Notes (Signed)
Virtual Visit via Video Note  I connected with Courtney [name] on 06/24/22 at 3:00pm by video enabled telemedicine application and verified that I am speaking with the correct person using two identifiers.   I discussed the limitations, risks, security and privacy concerns of performing an evaluation and management service by video and the availability of in person appointments. I also discussed with the patient that there may be a patient responsible charge related to this service. The patient expressed understanding and agreed to proceed.   I discussed the assessment and treatment plan with the patient. The patient was provided an opportunity to ask questions and all were answered. The patient agreed with the plan and demonstrated an understanding of the instructions.   The patient was advised to call back or seek an in-person evaluation if the symptoms worsen or if the condition fails to improve as anticipated.   I provided 45 minutes of non-face-to-face time during this encounter.   [name], LCSW, LCAS ________________________ THERAPIST PROGRESS NOTE   Session Time: 3:00pm - 3:45pm   Location: Patient: Patient Home Provider: Clinical Home Office   Participation Level: Active   Behavioral Response: Alert, casually dressed, depressed mood/affect    Type of Therapy:  Individual Therapy   Treatment Goals addressed: Depression and Anxiety management; Medication management; Monitoring substance use  Progress Towards Goals: Progressing    Interventions: CBT, psychoeducation on grief and loss    Summary: Courtney [name] is a 31 year old female that presented for therapy appointment and is diagnosed with Bipolar I Disorder, mixed, moderate; Borderline personality disorder; PTSD; Cannabis Use Disorder, moderate; and ADHD, combined type.        Suicidal/Homicidal: None; without intent or plan   Therapist Response: Clinician met with Courtney [name] for virtual therapy appointment and  assessed for safety, sobriety, and medication compliance.  Courtney [name] presented for session on time and was alert, oriented x5, with no evidence or self-report of active SI/HI or A/V H.  Courtney [name] reported compliance with medications.  Courtney [name] reported that substance use has declined over recent weeks, stating "I have had a beer here and there but I havent used cocaine in almost 2 weeks".  Courtney [name] reported that she is realizing she has poor self-control, so she is trying to cut contact with users.  Clinician reminded Courtney [name] of the previously noted physical and mental consequences that could result from ongoing substance use.  Clinician inquired about Courtney [name]'s current emotional ratings, and whether she has had any significant changes in thoughts, feelings, or behavior since last check-in.  Courtney [name] reported scores of 6/10 for depression, 5/10 for anxiety, 5/10 for irritability and 0/10 for mania.  Courtney [name] denied any recent outbursts or panic attacks. Courtney [name] reported that a recent struggle was learning that an old friend got shot and killed recently.  Clinician inquired about how Courtney [name] is handling her grief during this difficult time.  Courtney [name] reported that she has been talking to friends associated with this person for support, stating "It really broke me.  He went through a lot as a kid".  Clinician expressed sympathy for Courtney [name]'s loss, and provided her with psychoeducation on the 5 stages of grief, including denial, anger, bargaining, depression, and acceptance.  Clinician discussed how each stage can affect an individual, and provided strategies on how to faciliate healthy grieving as she processes this loss. Strategies provided to Courtney [name] included taking time to allow healthy emotional expression (I.e. allowing oneself to cry when appropriate), engaging in healthy self-care activities for distraction, talking to  people who can relate to the loss for support, and considering ways to  memorialize the deceased in a meaningful way.  Interventions were effective, as evidenced by Courtney [name]'s active engagement in discussion, reporting that she previously had minimal familiarity with the stages of grief, and this helped her better understand the changing emotional states that accompanied this loss, along with strategies for moving towards acceptance.  Courtney [name] reported that she also realized how this could be applied to the recent loss of a relationship she has been in, since she has been in a state of denial over their declining communication over the past month, and what this could indicate they are in a state of separation.  Courtney [name] stated "This helped me understand what has been going on with me.  Its like I've been in a dream I couldn't wake up from".  Clinician encouraged Courtney [name] to consider seeking grief counseling from a specialist if this remains a present concern, and provided contact information for [employer] to look into.  Clinician will continue to monitor.       Plan: Meet again in 2 weeks virtually.   Diagnosis: Bipolar I Disorder, mixed, moderate; Borderline personality disorder; PTSD; Cannabis Use Disorder, moderate; and ADHD, combined type.   Collaboration of Care:   No collaboration required at this time.                                                   Patient/Guardian was advised Release of Information must be obtained prior to any record release in order to collaborate their care with an outside provider. Patient/Guardian was advised if they have not already done so to contact the registration department to sign all necessary forms in order for [name] to release information regarding their care.    Consent: Patient/Guardian gives verbal consent for treatment and assignment of benefits for services provided during this visit. Patient/Guardian expressed understanding and agreed to proceed.  [name], [place], LCAS 06/24/22

## 2022-06-30 ENCOUNTER — Ambulatory Visit (HOSPITAL_COMMUNITY): Payer: No Typology Code available for payment source | Admitting: Licensed Clinical Social Worker

## 2022-07-07 ENCOUNTER — Ambulatory Visit (INDEPENDENT_AMBULATORY_CARE_PROVIDER_SITE_OTHER): Payer: No Typology Code available for payment source | Admitting: Licensed Clinical Social Worker

## 2022-07-07 DIAGNOSIS — F902 Attention-deficit hyperactivity disorder, combined type: Secondary | ICD-10-CM

## 2022-07-07 DIAGNOSIS — F3162 Bipolar disorder, current episode mixed, moderate: Secondary | ICD-10-CM

## 2022-07-07 DIAGNOSIS — F1220 Cannabis dependence, uncomplicated: Secondary | ICD-10-CM

## 2022-07-07 DIAGNOSIS — F603 Borderline personality disorder: Secondary | ICD-10-CM

## 2022-07-07 DIAGNOSIS — F4310 Post-traumatic stress disorder, unspecified: Secondary | ICD-10-CM | POA: Diagnosis not present

## 2022-07-07 NOTE — Progress Notes (Signed)
Virtual Visit via Video Note  I connected with Arita Severtson on 07/07/22 at 3:00pm by video enabled telemedicine application and verified that I am speaking with the correct person using two identifiers.   I discussed the limitations, risks, security and privacy concerns of performing an evaluation and management service by video and the availability of in person appointments. I also discussed with the patient that there may be a patient responsible charge related to this service. The patient expressed understanding and agreed to proceed.   I discussed the assessment and treatment plan with the patient. The patient was provided an opportunity to ask questions and all were answered. The patient agreed with the plan and demonstrated an understanding of the instructions.   The patient was advised to call back or seek an in-person evaluation if the symptoms worsen or if the condition fails to improve as anticipated.   I provided 30 minutes of non-face-to-face time during this encounter.   [name], LCSW, LCAS ________________________ THERAPIST PROGRESS NOTE   Session Time: 3:00pm - 3:30pm    Location: Patient: Patient Home Provider: Clinical Home Office   Participation Level: Active   Behavioral Response: Alert, casually dressed, anxious mood/affect   Type of Therapy:  Individual Therapy   Treatment Goals addressed: Depression and Anxiety management; Medication management; Monitoring substance use; Improving communication skills   Progress Towards Goals: Progressing    Interventions: CBT, problem solving, assertive communication skills     Summary: Chiyoko Torrico is a 31 year old female that presented for therapy appointment and is diagnosed with Bipolar I Disorder, mixed, moderate; Borderline personality disorder; PTSD; Cannabis Use Disorder, moderate; and ADHD, combined type.        Suicidal/Homicidal: None; without intent or plan   Therapist Response: Clinician met with  [name] for virtual therapy session and assessed for safety, sobriety, and medication compliance.  Chalisa presented for appointment on time and was alert, oriented x5, with no evidence or self-report of active SI/HI or A/V H.  Peta reported compliance with medications.  Daneisha reported that substance use has reduced, as she has limited drinking to x1 per week when socializing with friends, is using her THC vape pan once every 2-3x days, and has not used cocaine in over 2 weeks.  Clinician inquired about Nailani's emotional ratings today, and whether she has had any significant changes in thoughts, feelings, or behavior since previous check-in.  Takeisha reported scores of 2/10 for depression, 3/10 for anxiety, 0/10 for anger/irritability and 0/10 for mania.  Zaniyah denied any recent outbursts or panic attacks. Rosi reported that a recent success was being asked out by a friend to start dating, which she had been hoping for.  Emme reported that they spent thanksgiving with family, and this went well.  Nicolas reported that one struggle has been trying to determine whether or not to open up contact with her brother again, since they haven't spoken since July, and family have been encouraging them to reconcile.  Clinician assisted Kerby in running a cost benefit analysis regarding whether or not to adjust this boundary based upon their problematic history, and how reopening communication/contact could impact her mental health.  Azzure participated in analysis and reported that overall, there are equivalent costs and benefits to this decision, as they could settle differences, or just as likely fall back into old, harmful patterns of communication.  She reported that based upon this analysis, she feels like it is worth trying to speak with him at Christmas when family  gets together, although she isn't sure how to bring up the topic.  Clinician reviewed material with Elizabelle today  on assertive communication skills.  Clinician presented a handout on 'soft startups' which offered suggestions on how Crislyn could successfully bring up the topic of present boundaries with her brother, including tips such as choosing an appropriate time/setting, being mindful of maintaining gentle tone, volume and language, while avoiding triggering nonverbals such as rolling eyes, as well as utilizing "I" statements to express feelings, focusing on one problem at a time, and being respectful.  Intervention was effective, as evidenced by Sitlali actively engaging in discussion on topic, and reporting that she felt this helped prepare her for a difficult conversation, since she will use "I" statements to express her feelings clearly, and respectfully, ask to speak in a location separate from the family to avoid distractions and interruptions, and offer the brother a chance to share his feelings without judgement.  Oaklyn stated "I think this will go really well if I take my time".  Clinician will continue to monitor.       Plan: Meet again in 2 weeks virtually.   Diagnosis: Bipolar I Disorder, mixed, moderate; Borderline personality disorder; PTSD; Cannabis Use Disorder, moderate; and ADHD, combined type.   Collaboration of Care:   No collaboration required at this time.                                                   Patient/Guardian was advised Release of Information must be obtained prior to any record release in order to collaborate their care with an outside provider. Patient/Guardian was advised if they have not already done so to contact the registration department to sign all necessary forms in order for us to release information regarding their care.    Consent: Patient/Guardian gives verbal consent for treatment and assignment of benefits for services provided during this visit. Patient/Guardian expressed understanding and agreed to proceed.    , LCSW, LCAS 07/07/22    

## 2022-07-14 ENCOUNTER — Ambulatory Visit (HOSPITAL_COMMUNITY): Payer: No Typology Code available for payment source | Admitting: Licensed Clinical Social Worker

## 2022-07-21 ENCOUNTER — Ambulatory Visit (INDEPENDENT_AMBULATORY_CARE_PROVIDER_SITE_OTHER): Payer: No Typology Code available for payment source | Admitting: Licensed Clinical Social Worker

## 2022-07-21 DIAGNOSIS — F603 Borderline personality disorder: Secondary | ICD-10-CM

## 2022-07-21 DIAGNOSIS — F3162 Bipolar disorder, current episode mixed, moderate: Secondary | ICD-10-CM | POA: Diagnosis not present

## 2022-07-21 DIAGNOSIS — F902 Attention-deficit hyperactivity disorder, combined type: Secondary | ICD-10-CM

## 2022-07-21 DIAGNOSIS — F1220 Cannabis dependence, uncomplicated: Secondary | ICD-10-CM | POA: Diagnosis not present

## 2022-07-21 DIAGNOSIS — F4310 Post-traumatic stress disorder, unspecified: Secondary | ICD-10-CM | POA: Diagnosis not present

## 2022-07-21 NOTE — Progress Notes (Signed)
Virtual Visit via Video Note  I connected with Courtney [name] on 07/21/22 at 3:00pm by video enabled telemedicine application and verified that I am speaking with the correct person using two identifiers.   I discussed the limitations, risks, security and privacy concerns of performing an evaluation and management service by video and the availability of in person appointments. I also discussed with the patient that there may be a patient responsible charge related to this service. The patient expressed understanding and agreed to proceed.   I discussed the assessment and treatment plan with the patient. The patient was provided an opportunity to ask questions and all were answered. The patient agreed with the plan and demonstrated an understanding of the instructions.   The patient was advised to call back or seek an in-person evaluation if the symptoms worsen or if the condition fails to improve as anticipated.   I provided 34 minutes of non-face-to-face time during this encounter.   Courtney Flood, LCSW, LCAS ________________________ THERAPIST PROGRESS NOTE   Session Time: 3:00pm - 3:34pm   Location: Patient: Patient Home Provider: Clinical Home Office   Participation Level: Active   Behavioral Response: Alert, casually dressed, depressed mood/affect   Type of Therapy:  Individual Therapy   Treatment Goals addressed: Depression and Anxiety management; Medication management; Monitoring substance use; Improving communication skills   Progress Towards Goals: Progressing   Interventions: CBT: challenging anxious thoughts, problem solving     Summary: Courtney [name] is a 31 year old female that presented for therapy appointment and is diagnosed with Bipolar I Disorder, mixed, moderate; Borderline personality disorder; PTSD; Cannabis Use Disorder, moderate; and ADHD, combined type.        Suicidal/Homicidal: None; without intent or plan   Therapist Response: Clinician met with  Courtney [name] for virtual therapy appointment and assessed for safety, sobriety, and medication compliance.  Courtney [name] presented for session on time and was alert, oriented x5, with no evidence or self-report of active SI/HI or A/V H.  Courtney [name] reported compliance with medications.  Courtney [name] denied use of alcohol.  She reported that she is using coke orally once per week, averaging "1 bump" per event.  Courtney [name] reported that she is using marijuana 2-3x per week.  Clinician inquired about Courtney [name]'s current emotional ratings, and whether she has had any significant changes in thoughts, feelings, or behavior since last check-in.  Courtney [name] reported scores of 5/10 for depression, 1/10 for anxiety, 0/10 for anger/irritability and 1/10 for mania.  Courtney [name] denied any recent outbursts. Courtney [name] reported that a recent struggle was worrying about her partner for several days when he abruptly stopped returning calls/texts from her.  Courtney [name] reported that she has a tendency to expect the worst possible outcome when this has happened, and this has not been an isolated incident.  Clinician utilized handout in session today titled "Worry exploration" in order to assist Courtney [name] in reducing her anxiety related to miscommunication within the relationship.  This worksheet featured a series of Socratic questions aimed at exploring the most likely outcomes for a situation of concern, rather than focusing on the worst possible outcome (i.e. catastrophizing).  Clinician assisted Courtney [name] in identifying and challenging any irrational beliefs related to this worry, in addition to utilizing problem solving approach to explore strategies which would help her assertively address difference in boundaries between her and partner.  Courtney [name] actively participated in discussion on handout, reporting that due to past trauma she has experienced, as well as partners that 'ghosted' her to end relationships, she usually assumes  something bad  has happened when people stop replying to her.  Courtney [name] reported that there is sufficient evidence to suggest that her worries are exaggerated when her partner does this, as it has happened 5 times now, and she is always able to eventually hear back from him when he is ready.  Courtney [name] reported that she has become increasingly frustrated with this pattern, and will plan to address this tonight with him.  Clinician reviewed assertive communication strategies that Courtney [name] could utilize to accomplish this goal confidently and establish a boundary.  Interventions were effective, as evidenced by Courtney [name] actively engaging in both activities, reporting that this would help her keep anxiety under control when faced with distressing situations, and address problems directly, and appropriately in relationships with less chance of conflict.  Courtney [name] stated "I was definitely thinking in terms of worst case scenarios the other day, and its not fair to me when he does this.  There are a lot of red flags and I won't keep putting up with it".  Clinician will continue to monitor.       Plan: Meet again in 2 weeks virtually.   Diagnosis: Bipolar I Disorder, mixed, moderate; Borderline personality disorder; PTSD; Cannabis Use Disorder, moderate; and ADHD, combined type.   Collaboration of Care:   No collaboration required at this time.                                                   Patient/Guardian was advised Release of Information must be obtained prior to any record release in order to collaborate their care with an outside provider. Patient/Guardian was advised if they have not already done so to contact the registration department to sign all necessary forms in order for [name] to release information regarding their care.    Consent: Patient/Guardian gives verbal consent for treatment and assignment of benefits for services provided during this visit. Patient/Guardian expressed understanding and agreed to  proceed.  Courtney [name], Courtney [name], LCAS 07/21/22

## 2022-08-18 ENCOUNTER — Ambulatory Visit (INDEPENDENT_AMBULATORY_CARE_PROVIDER_SITE_OTHER): Payer: No Typology Code available for payment source | Admitting: Licensed Clinical Social Worker

## 2022-08-18 DIAGNOSIS — F1220 Cannabis dependence, uncomplicated: Secondary | ICD-10-CM

## 2022-08-18 DIAGNOSIS — F902 Attention-deficit hyperactivity disorder, combined type: Secondary | ICD-10-CM

## 2022-08-18 DIAGNOSIS — F603 Borderline personality disorder: Secondary | ICD-10-CM

## 2022-08-18 DIAGNOSIS — F3162 Bipolar disorder, current episode mixed, moderate: Secondary | ICD-10-CM

## 2022-08-18 DIAGNOSIS — F4310 Post-traumatic stress disorder, unspecified: Secondary | ICD-10-CM | POA: Diagnosis not present

## 2022-08-18 NOTE — Progress Notes (Signed)
Virtual Visit via Video Note  I connected with Courtney [name] on 08/18/22 at 3:00pm by video enabled telemedicine application and verified that I am speaking with the correct person using two identifiers.   I discussed the limitations, risks, security and privacy concerns of performing an evaluation and management service by video and the availability of in person appointments. I also discussed with the patient that there may be a patient responsible charge related to this service. The patient expressed understanding and agreed to proceed.   I discussed the assessment and treatment plan with the patient. The patient was provided an opportunity to ask questions and all were answered. The patient agreed with the plan and demonstrated an understanding of the instructions.   The patient was advised to call back or seek an in-person evaluation if the symptoms worsen or if the condition fails to improve as anticipated.   I provided 1 hour of non-face-to-face time during this encounter.   Courtney Stain, LCSW, LCAS ________________________ THERAPIST PROGRESS NOTE   Session Time: 3:00pm - 4:00pm   Location: Patient: Patient Home Provider: Clinical Home Office   Participation Level: Active   Behavioral Response: Alert, casually dressed, depressed mood/affect   Type of Therapy:  Individual Therapy   Treatment Goals addressed: Depression and Anxiety management; Medication management; Monitoring substance use; Following up with psychiatrist; Following safety plan    Progress Towards Goals: Progressing   Interventions: CBT, safety planning, problem solving     Summary: Courtney [name] is a 32 year old female that presented for therapy appointment and is diagnosed with Bipolar I Disorder, mixed, moderate; Borderline personality disorder; PTSD; Cannabis Use Disorder, moderate; and ADHD, combined type.        Suicidal/Homicidal: None; without intent or plan   Therapist Response: Clinician met with  Courtney [name] for virtual therapy session and assessed for safety, sobriety, and medication compliance.  Courtney [name] presented for appointment on time and was alert, oriented x5, with no evidence or self-report of active SI/HI or A/V H.  Courtney [name] reported compliance with medications.  Courtney [name] denied use of alcohol.  She reported that she has not used cocaine for several weeks, and has been limiting THC use to 1-3x per week.  She stated "I want to stay more sober this year and try to distract myself from that".  Clinician inquired about Courtney [name]'s emotional ratings today, and whether she has had any significant changes in thoughts, feelings, or behavior since previous check-in.  Courtney [name] reported scores of 2/10 for depression, 1/10 for anxiety, 0/10 for anger/irritability and 0/10 for mania.  Courtney [name] denied any recent outbursts. Courtney [name] reported that a recent success was linking with a new psychiatrist, who increased the dosage of her mood stabilizer since she had previously been experiencing more panic attacks and mania lately.  She stated "I do feel more balanced since they changed my meds".  Courtney [name] reported that a struggle was having a "Mental breakdown" on 08/05/22, which led her to consider going to the hospital for stabilization.  Clinician revisited safety plan with Courtney [name] to assess strengths and weaknesses at present.  This included inventory of triggers/stressors which could have influenced this crisis, warning signs that were present, distraction/coping techniques that were employed, available supports which could be outreached for assistance, safety measures that could be taken (I.e. having someone manage medications if necessary), and review of professional resources that could be utilized in the future if needed (I.e. local behavioral hospital and crisis numbers such as 988).  Intervention was effective, as evidenced  by Courtney [name]'s active engagement in safety planning process, and receptiveness  to changes which could be made to improve plan, such as meditating or journaling more often to get in touch with her emotions and increase insight into mood changes, practicing relaxation skills like deep breathing when feeling upset, assessing quality of supports in network which could help in a crisis, and keeping crisis numbers visible around the home.  Courtney [name] reported that she will need to be mindful of maintaining healthier social media boundaries around the holidays since reading about others' happy experiences can make her feel lonely and sad.  She denied experiencing SI/HI at this time, and could contract for safety, agreeing to seek hospitalization if necessary should SI/HI appear with intent/plan to harm herself or others.  Clinician will continue to monitor.       Plan: Meet again in 2 weeks virtually.   Diagnosis: Bipolar I Disorder, mixed, moderate; Borderline personality disorder; PTSD; Cannabis Use Disorder, moderate; and ADHD, combined type.   Collaboration of Care:   No collaboration required at this time.                                                   Patient/Guardian was advised Release of Information must be obtained prior to any record release in order to collaborate their care with an outside provider. Patient/Guardian was advised if they have not already done so to contact the registration department to sign all necessary forms in order for [name] to release information regarding their care.    Consent: Patient/Guardian gives verbal consent for treatment and assignment of benefits for services provided during this visit. Patient/Guardian expressed understanding and agreed to proceed.  Courtney [name], , LCAS 08/18/22

## 2022-09-01 ENCOUNTER — Ambulatory Visit (INDEPENDENT_AMBULATORY_CARE_PROVIDER_SITE_OTHER): Payer: No Typology Code available for payment source | Admitting: Licensed Clinical Social Worker

## 2022-09-01 DIAGNOSIS — F1220 Cannabis dependence, uncomplicated: Secondary | ICD-10-CM

## 2022-09-01 DIAGNOSIS — F603 Borderline personality disorder: Secondary | ICD-10-CM | POA: Diagnosis not present

## 2022-09-01 DIAGNOSIS — F3162 Bipolar disorder, current episode mixed, moderate: Secondary | ICD-10-CM | POA: Diagnosis not present

## 2022-09-01 DIAGNOSIS — F4310 Post-traumatic stress disorder, unspecified: Secondary | ICD-10-CM | POA: Diagnosis not present

## 2022-09-01 DIAGNOSIS — F902 Attention-deficit hyperactivity disorder, combined type: Secondary | ICD-10-CM

## 2022-09-01 NOTE — Progress Notes (Signed)
Virtual Visit via Video Note  I connected with [name] on 09/01/22 at 3:00pm by video enabled telemedicine application and verified that I am speaking with the correct person using two identifiers.   I discussed the limitations, risks, security and privacy concerns of performing an evaluation and management service by video and the availability of in person appointments. I also discussed with the patient that there may be a patient responsible charge related to this service. The patient expressed understanding and agreed to proceed.   I discussed the assessment and treatment plan with the patient. The patient was provided an opportunity to ask questions and all were answered. The patient agreed with the plan and demonstrated an understanding of the instructions.   The patient was advised to call back or seek an in-person evaluation if the symptoms worsen or if the condition fails to improve as anticipated.   I provided 46 minutes of non-face-to-face time during this encounter.   [name], LCSW, LCAS ________________________ THERAPIST PROGRESS NOTE   Session Time: 3:00pm - 3:46pm   Location: Patient: Patient Home Provider: Clinical Home Office   Participation Level: Active   Behavioral Response: Alert, casually dressed, angry/irritable mood/affect   Type of Therapy:  Individual Therapy   Treatment Goals addressed: Depression and Anxiety management; Medication management; Monitoring substance use  Progress Towards Goals: Progressing   Interventions: CBT, psychoeducation on building a sober support network    Summary: Courtney [name] is a 32 year old female that presented for therapy appointment and is diagnosed with Bipolar I Disorder, mixed, moderate; Borderline personality disorder; PTSD; Cannabis Use Disorder, moderate; and ADHD, combined type.        Suicidal/Homicidal: None; without intent or plan   Therapist Response: Clinician met with [name] for virtual  therapy appointment and assessed for safety, sobriety, and medication compliance.  [name] presented for session on time and was alert, oriented x5, with no evidence or self-report of active SI/HI or A/V H.  [name] reported compliance with medications.  Amar reported use of alcohol and marijuana 'dabs' over the weekend while out with friends.  Clinician inquired about Courtney [name]'s current emotional ratings, and whether she has had any significant changes in thoughts, feelings, or behavior since last check-in.  [name] reported scores of 2/10 for depression, 0/10 for anxiety, 5/10 for anger/irritability and 0/10 for mania.  Christyne denied any recent outbursts or panic attacks. Courtney [name] reported that a recent struggle was getting out of her comfort zone by accepting an invite to go out with new friends over the weekend, although this is what triggered her to use alcohol and marijuana, stating "I was nervous and wanted to calm some of my anxieties.  I'm not sure how to socialize otherwise".  Clinician introduced topic of building a sober support network today and explained how this can be defined as having a having a group of healthy, sober people in one's life you can talk to, spend time with, and get direct help from when necessary in order to reinforce abstinence efforts.  Clinician noted several benefits that can result from strengthening one's network, including chances to repair existing relationships harmed from past behavior, having an outlet to express thoughts without judgement, receiving positive peer pressure, reducing chance of relapse, and having access to more opportunities for growth such as learning of job openings, or social events.  Clinician explained that some barriers can make it difficult to connect with other people, including the presence of anxiety or depression, or moving to an unfamiliar  area.  Courtney [name] was asked to assess the current state of her support network, and identify  ways that this could be improved.  Tips were given on how to address previously noted barriers, such as strengthening social skills, using relaxation techniques to reduce anxiety, scheduling social time each week, and/or exploring social events nearby which could increase chances of meeting new supports.  Intervention was effective, as evidenced by Courtney [name]'s active engagement in discussion on subject, reporting that there are a few barriers which have led her to struggle with this area of wellness, including frequently moving from home to home, which has led her to lose touch with several people, in addition to lack of practice with social/communication skills.  She reported that she also lacks transportation, so it is hard to get out of her home and go to settings where she could meet new people.  She was receptive to tips offered today, including getting back into a routine of practicing relaxation skills, and positive self-talk to avoid reliance upon substances, in addition to setting goals to reconnect with friends she has lost touch with this week, and look for a new church to attend nearby which may offer to chance to make new connections.  She stated "I need more people in my life because I can't do this alone".  Clinician will continue to monitor.       Plan: Meet again in 2 weeks virtually.   Diagnosis: Bipolar I Disorder, mixed, moderate; Borderline personality disorder; PTSD; Cannabis Use Disorder, moderate; and ADHD, combined type.   Collaboration of Care:   No collaboration required at this time.                                                   Patient/Guardian was advised Release of Information must be obtained prior to any record release in order to collaborate their care with an outside provider. Patient/Guardian was advised if they have not already done so to contact the registration department to sign all necessary forms in order for [name] to release information regarding their care.     Consent: Patient/Guardian gives verbal consent for treatment and assignment of benefits for services provided during this visit. Patient/Guardian expressed understanding and agreed to proceed.  [name], [place], LCAS 09/01/22

## 2022-09-15 ENCOUNTER — Ambulatory Visit (INDEPENDENT_AMBULATORY_CARE_PROVIDER_SITE_OTHER): Payer: No Typology Code available for payment source | Admitting: Licensed Clinical Social Worker

## 2022-09-15 DIAGNOSIS — F4310 Post-traumatic stress disorder, unspecified: Secondary | ICD-10-CM | POA: Diagnosis not present

## 2022-09-15 DIAGNOSIS — F1220 Cannabis dependence, uncomplicated: Secondary | ICD-10-CM | POA: Diagnosis not present

## 2022-09-15 DIAGNOSIS — F3162 Bipolar disorder, current episode mixed, moderate: Secondary | ICD-10-CM | POA: Diagnosis not present

## 2022-09-15 DIAGNOSIS — F902 Attention-deficit hyperactivity disorder, combined type: Secondary | ICD-10-CM

## 2022-09-15 DIAGNOSIS — F603 Borderline personality disorder: Secondary | ICD-10-CM

## 2022-09-15 NOTE — Progress Notes (Signed)
Virtual Visit via Video Note  I connected with Adaria Hole on 09/15/22 at 3:00pm by video enabled telemedicine application and verified that I am speaking with the correct person using two identifiers.   I discussed the limitations, risks, security and privacy concerns of performing an evaluation and management service by video and the availability of in person appointments. I also discussed with the patient that there may be a patient responsible charge related to this service. The patient expressed understanding and agreed to proceed.   I discussed the assessment and treatment plan with the patient. The patient was provided an opportunity to ask questions and all were answered. The patient agreed with the plan and demonstrated an understanding of the instructions.   The patient was advised to call back or seek an in-person evaluation if the symptoms worsen or if the condition fails to improve as anticipated.   I provided 1 hour of non-face-to-face time during this encounter.   [name], LCSW, LCAS ________________________ THERAPIST PROGRESS NOTE   Session Time: 3:00pm - 4:00pm   Location: Patient: Patient Home Provider: Clinical Home Office   Participation Level: Active   Behavioral Response: Alert, casually dressed, anxious mood/affect   Type of Therapy:  Individual Therapy   Treatment Goals addressed: Depression and Anxiety management; Medication management; Monitoring substance use  Progress Towards Goals: Progressing   Interventions: CBT: challenging irrational thoughts    Summary: Archana Eckman is a 32 year old female that presented for therapy appointment and is diagnosed with Bipolar I Disorder, mixed, moderate; Borderline personality disorder; PTSD; Cannabis Use Disorder, moderate; and ADHD, combined type.        Suicidal/Homicidal: None; without intent or plan   Therapist Response: Clinician met with [name] for virtual therapy session and assessed for  safety, sobriety, and medication compliance.  Koralee presented for appointment on time and was alert, oriented x5, with no evidence or self-report of active SI/HI or A/V H.  Sindia reported compliance with medications.  Taelyr denied any recent use of alcohol or cocaine.  She reported that she has been trying to cut back on overall marijuana use, stating "I've been doing good.  I did take a few bowl hits over the weekend, but I don't need it. I want to handle things without needing a substance".  Clinician inquired about Tika's emotional ratings today, and whether she has had any significant changes in thoughts, feelings, or behavior since previous check-in.  Hannie reported scores of 3/10 for depression, 4/10 for anxiety, 0/10 for anger/irritability and 0/10 for mania.  Yeilin denied any recent outbursts or panic attacks. Fredrica reported that a recent success was outreaching a trauma professional to start therapy with, although she needs to see if they accept her coverage.  Zyanne reported that a recent struggle was breaking up with her boyfriend, stating "I self-sabotaged and fucked things up.  I have this problem where I get scared people are going to leave, and unintentionally push them away.  My brain tells me that they are going to leave me and I feel so sure of it".  Clinician utilized handout in session today titled "Worry exploration" in order to assist Karena in challenging anxious thoughts of abandonment related to her BPD diagnosis.  This worksheet featured a series of Socratic questions aimed at exploring the most likely outcomes for a situation of concern, rather than focusing on the worst possible outcome (i.e. catastrophizing).  Clinician assisted Yen in identifying and challenging any irrational beliefs related to relationship worries,  in addition to utilizing problem solving approach to explore strategies which would help her maintain healthy relationships that  support treatment goals.  [name] actively participated in discussion on handout, reporting that she has ended relationships abruptly several times in the past due to concerns about imagined red flags, then regretted it later, and this pattern repeated itself again.  Gursimran reported that there is significant evidence to suggest that her most recent relationship was in fact healthy due to consistency seen in her partner's behavior, and understanding for her unique diagnoses, stating "He never gave up on me".  Intervention was effective, as evidenced by [name] reporting that discussion on this subject helped her better understand how borderline personality disorder can fuel irrational thinking, and provided new strategies for interrupting this harmful cycle.  She stated "I can't keep making these decisions based upon my emotions".  Clinician will continue to monitor.       Plan: Meet again in 2 weeks virtually.   Diagnosis: Bipolar I Disorder, mixed, moderate; Borderline personality disorder; PTSD; Cannabis Use Disorder, moderate; and ADHD, combined type.   Collaboration of Care:   No collaboration required at this time.                                                   Patient/Guardian was advised Release of Information must be obtained prior to any record release in order to collaborate their care with an outside provider. Patient/Guardian was advised if they have not already done so to contact the registration department to sign all necessary forms in order for [name] to release information regarding their care.    Consent: Patient/Guardian gives verbal consent for treatment and assignment of benefits for services provided during this visit. Patient/Guardian expressed understanding and agreed to proceed.  [name], LCSW, LCAS 09/15/22

## 2022-09-29 ENCOUNTER — Ambulatory Visit (INDEPENDENT_AMBULATORY_CARE_PROVIDER_SITE_OTHER): Payer: No Typology Code available for payment source | Admitting: Licensed Clinical Social Worker

## 2022-09-29 DIAGNOSIS — F603 Borderline personality disorder: Secondary | ICD-10-CM

## 2022-09-29 DIAGNOSIS — F1220 Cannabis dependence, uncomplicated: Secondary | ICD-10-CM

## 2022-09-29 DIAGNOSIS — F3162 Bipolar disorder, current episode mixed, moderate: Secondary | ICD-10-CM | POA: Diagnosis not present

## 2022-09-29 DIAGNOSIS — F4310 Post-traumatic stress disorder, unspecified: Secondary | ICD-10-CM

## 2022-09-29 DIAGNOSIS — F902 Attention-deficit hyperactivity disorder, combined type: Secondary | ICD-10-CM

## 2022-09-29 NOTE — Progress Notes (Signed)
Virtual Visit via Video Note  I connected with Courtney [name] on 09/29/22 at 3:00pm by video enabled telemedicine application and verified that I am speaking with the correct person using two identifiers.   I discussed the limitations, risks, security and privacy concerns of performing an evaluation and management service by video and the availability of in person appointments. I also discussed with the patient that there may be a patient responsible charge related to this service. The patient expressed understanding and agreed to proceed.   I discussed the assessment and treatment plan with the patient. The patient was provided an opportunity to ask questions and all were answered. The patient agreed with the plan and demonstrated an understanding of the instructions.   The patient was advised to call back or seek an in-person evaluation if the symptoms worsen or if the condition fails to improve as anticipated.   I provided 50 minutes of non-face-to-face time during this encounter.   [name], LCSW, LCAS ________________________ THERAPIST PROGRESS NOTE   Session Time: 3:00pm - 3:50pm     Location: Patient: Patient Home Provider: [facility]   Participation Level: Active   Behavioral Response: Alert, casually dressed, depressed mood/affect    Type of Therapy:  Individual Therapy   Treatment Goals addressed: Depression and Anxiety management; Medication management; Monitoring substance use; Improving communication skills and boundaries      Progress Towards Goals: Progressing   Interventions: CBT: conflict resolution skills      Summary: Courtney [name] is a 32 year old female that presented for therapy appointment and is diagnosed with Bipolar I Disorder, mixed, moderate; Borderline personality disorder; PTSD; Cannabis Use Disorder, moderate; and ADHD, combined type.        Suicidal/Homicidal: None; without intent or plan   Therapist Response: Clinician met with Courtney [name]  for virtual therapy appointment and assessed for safety, sobriety, and medication compliance.  Courtney [name] presented for session on time and was alert, oriented x5, with no evidence or self-report of active SI/HI or A/V H.  Brelee reported compliance with medications.  Courtney [name] denied any recent use of alcohol.  She reported that she did relapse on cocaine over the weekend, using 'a couple of hits' when she was feeling more depressed.  She reported that she has continued sporadic social use of marijuana when able to access it from friends.  Clinician encouraged Courtney [name] to abstain from using these illicit substances as a means to cope with difficult feelings, and instead use coping skills offered through therapy.  Clinician inquired about Courtney [name]'s current emotional ratings, and whether she has had any significant changes in thoughts, feelings, or behavior since last check-in.  Courtney [name] reported scores of 9/10 for depression, 9/10 for anxiety, 7/10 for anger/irritability and 0/10 for mania.  Courtney [name] denied any recent outbursts or panic attacks. Courtney [name] reported that a recent struggle was having an argument with her roommate the other day, which escalated and led to increase tension between them.  Clinician covered topic of conflict resolution today and virtually shared a handout on subject with Courtney [name] which warned against the 'four horsemen' of communication traps that should be avoided due to tendency to escalate and damage a relationship.  These included criticism, defensiveness, contempt, and stonewalling.  'Antidotes' to these harmful behaviors were offered as healthy replacements to improve communication and understanding, including approaching problems with a gentle startup, taking responsibility for one's behavior, sharing fondness/admiration, and using self-soothing to calm down and focus on the problem at hand.  Clinician encouraged Courtney [name]  to apply this material to recent experiences with  conflict that she has faced, consider which approach she utilized, and any changes that she could plan to implement in order to improve overall conflict resolution skills.  Intervention was effective, as evidenced by Courtney [name] actively participating in discussion on topic, reporting that she has engaged in some of these communication traps before, including criticism, contempt, and stonewalling.  Courtney [name] reported that this has led to consequences such as loss of friendships and relationships, heightened depression, anxiety, and loneliness.  Courtney [name] stated "I try to take responsibility and accountability when I screw up.  I don't want to be petty and spiteful, but I can be sarcastic, and an asshole when I'm mad enough".  Courtney [name] expressed receptiveness to alternative strategies offered in order to improve conflict resolution skills, including practicing her relaxation skills or taking timeouts when feeling volatile emotions arising in conversations, and practice "I" statements in order to express herself clearly, and respectfully to supports.  She stated "I have said some mean things and I'm trying to do better.  I need to watch what I say and be kinder with my words".  Clinician will continue to monitor.       Plan: Meet again in 2 weeks virtually.   Diagnosis: Bipolar I Disorder, mixed, moderate; Borderline personality disorder; PTSD; Cannabis Use Disorder, moderate; and ADHD, combined type.   Collaboration of Care:   No collaboration required at this time.                                                   Patient/Guardian was advised Release of Information must be obtained prior to any record release in order to collaborate their care with an outside provider. Patient/Guardian was advised if they have not already done so to contact the registration department to sign all necessary forms in order for [name] to release information regarding their care.    Consent: Patient/Guardian gives verbal consent  for treatment and assignment of benefits for services provided during this visit. Patient/Guardian expressed understanding and agreed to proceed.  [name], LCSW, LCAS 09/29/22

## 2022-10-09 ENCOUNTER — Encounter: Payer: Self-pay | Admitting: Radiology

## 2022-10-13 ENCOUNTER — Ambulatory Visit (HOSPITAL_COMMUNITY): Payer: No Typology Code available for payment source | Admitting: Licensed Clinical Social Worker

## 2022-10-13 ENCOUNTER — Encounter (HOSPITAL_COMMUNITY): Payer: Self-pay

## 2022-10-27 ENCOUNTER — Ambulatory Visit (INDEPENDENT_AMBULATORY_CARE_PROVIDER_SITE_OTHER): Payer: No Typology Code available for payment source | Admitting: Licensed Clinical Social Worker

## 2022-10-27 DIAGNOSIS — F4310 Post-traumatic stress disorder, unspecified: Secondary | ICD-10-CM | POA: Diagnosis not present

## 2022-10-27 DIAGNOSIS — F902 Attention-deficit hyperactivity disorder, combined type: Secondary | ICD-10-CM

## 2022-10-27 DIAGNOSIS — F3162 Bipolar disorder, current episode mixed, moderate: Secondary | ICD-10-CM

## 2022-10-27 DIAGNOSIS — F603 Borderline personality disorder: Secondary | ICD-10-CM | POA: Diagnosis not present

## 2022-10-27 DIAGNOSIS — F1220 Cannabis dependence, uncomplicated: Secondary | ICD-10-CM

## 2022-10-27 NOTE — Progress Notes (Signed)
Virtual Visit via Video Note  I connected with Courtney [name] on 10/27/22 at 3:00pm by video enabled telemedicine application and verified that I am speaking with the correct person using two identifiers.   I discussed the limitations, risks, security and privacy concerns of performing an evaluation and management service by video and the availability of in person appointments. I also discussed with the patient that there may be a patient responsible charge related to this service. The patient expressed understanding and agreed to proceed.   I discussed the assessment and treatment plan with the patient. The patient was provided an opportunity to ask questions and all were answered. The patient agreed with the plan and demonstrated an understanding of the instructions.   The patient was advised to call back or seek an in-person evaluation if the symptoms worsen or if the condition fails to improve as anticipated.   I provided 1 hour of non-face-to-face time during this encounter.   [name], LCSW, LCAS ________________________ THERAPIST PROGRESS NOTE   Session Time: 3:00pm - 4:00pm   Location: Patient: Patient Home Provider: [facility]   Participation Level: Active   Behavioral Response: Alert, casually dressed, anxious mood/affect    Type of Therapy:  Individual Therapy   Treatment Goals addressed: Depression and Anxiety management; Medication management; Monitoring substance use  Progress Towards Goals: Ongoing   Interventions: CBT, relapse prevention planning      Summary: Courtney [name] is a 32 year old female that presented for therapy appointment and is diagnosed with Bipolar I Disorder, mixed, moderate; Borderline personality disorder; PTSD; Cannabis Use Disorder, moderate; and ADHD, combined type.        Suicidal/Homicidal: None; without intent or plan   Therapist Response: Clinician met with Courtney [name] for virtual therapy session and assessed for safety,  sobriety, and medication compliance.  Courtney [name] presented for appointment on time and was alert, oriented x5, with no evidence or self-report of active SI/HI or A/V H.  Courtney [name] reported compliance with medications.  Courtney [name] denied any recent use of alcohol, and also denied using cocaine for several weeks.  Courtney [name] reported that she is using marijuana occasionally. She reported that she did use '2 bumps' of fentanyl over the weekend, which she had never used before.  Clinician inquired about Courtney [name]'s emotional ratings today, and whether she has had any significant changes in thoughts, feelings, or behavior since previous check-in.  Courtney [name] reported scores of 3/10 for depression, 5/10 for anxiety, 1/10 for anger/irritability and 0/10 for mania.  Courtney [name] denied any recent outbursts or panic attacks. Courtney [name] reported that a recent struggle was voluntarily hospitalizing herself 2 weeks ago when she was experiencing a manic episode which left her awake for several days.  Courtney [name] reported that the doctor adjusted her medication regimen following this visit, which appears to have helped so far in stabilizing her mood/energy.  Clinician provided psychoeducation on the mental and physical consequences that can result from use of fentanyl and encouraged her to abstain from this in the future based upon numerous risks, including high potential for overdose or even death at low quantities.  Courtney [name] was receptive to this information, and agreed with recommendation, stating "I've never doing it again.  I got very lucky because I know a lot of people die from it".  Courtney [name] reported that discussing this made her motivated to strength relapse prevention efforts, stating "Someone offered me meth the other day too, so I've got to stop putting myself in these situations".  Clinician revisited material on  relapse prevention techniques with Courtney [name] to aid in preventing future use, including practice of refusal  skills which could be used to turn down offers of drugs in social settings. Interventions were effective, as evidenced by Courtney [name]'s active engagement in discussion on the subject, and receptiveness to relapse prevention planning, including setting healthier boundaries with supports that use illicit substances, improving overall refusal skills to decline offers of substances when caught off guard, and focusing more on positive self-care activities, like exercise and jewelry making.  Clinician discussed availability of substance abuse treatment programs and community support groups that could assist Courtney [name] in addressing ongoing polysubstance use.  Courtney [name] declined referrals at this time.  Clinician will continue to monitor.       Plan: Meet again in 2 weeks virtually.   Diagnosis: Bipolar I Disorder, mixed, moderate; Borderline personality disorder; PTSD; Cannabis Use Disorder, moderate; and ADHD, combined type.   Collaboration of Care:   No collaboration required at this time.                                                   Patient/Guardian was advised Release of Information must be obtained prior to any record release in order to collaborate their care with an outside provider. Patient/Guardian was advised if they have not already done so to contact the registration department to sign all necessary forms in order for [name] to release information regarding their care.    Consent: Patient/Guardian gives verbal consent for treatment and assignment of benefits for services provided during this visit. Patient/Guardian expressed understanding and agreed to proceed.  [name], [place], LCAS 10/27/22

## 2022-11-17 ENCOUNTER — Ambulatory Visit (INDEPENDENT_AMBULATORY_CARE_PROVIDER_SITE_OTHER): Payer: No Typology Code available for payment source | Admitting: Licensed Clinical Social Worker

## 2022-11-17 DIAGNOSIS — F3162 Bipolar disorder, current episode mixed, moderate: Secondary | ICD-10-CM

## 2022-11-17 DIAGNOSIS — F603 Borderline personality disorder: Secondary | ICD-10-CM | POA: Diagnosis not present

## 2022-11-17 DIAGNOSIS — F1220 Cannabis dependence, uncomplicated: Secondary | ICD-10-CM

## 2022-11-17 DIAGNOSIS — F4310 Post-traumatic stress disorder, unspecified: Secondary | ICD-10-CM

## 2022-11-17 DIAGNOSIS — F902 Attention-deficit hyperactivity disorder, combined type: Secondary | ICD-10-CM

## 2022-11-17 NOTE — Progress Notes (Signed)
Virtual Visit via Video Note  I connected with Courtney [name] on 11/17/22 at 3:00pm by video enabled telemedicine application Courtney verified that I am speaking with the correct person using two identifiers.   I discussed the limitations, risks, security Courtney privacy concerns of performing an evaluation Courtney management service by video Courtney the availability of in person appointments. I also discussed with the patient that there may be a patient responsible charge related to this service. The patient expressed understanding Courtney agreed to proceed.   I discussed the assessment Courtney treatment plan with the patient. The patient was provided an opportunity to ask questions Courtney all were answered. The patient agreed with the plan Courtney demonstrated an understanding of the instructions.   The patient was advised to call back or seek an in-person evaluation if the symptoms worsen or if the condition fails to improve as anticipated.   I provided 50 minutes of non-face-to-face time during this encounter.   [name], LCSW, LCAS ________________________ THERAPIST PROGRESS NOTE   Session Time: 3:00pm - 3:50pm    Location: Patient: Patient Home Provider: [facility]   Participation Level: Active   Behavioral Response: Alert, casually dressed, euthymic mood/affect    Type of Therapy:  Individual Therapy   Treatment Goals addressed: Depression Courtney Anxiety management; Medication management; Monitoring substance use; Managing boundaries; [name] planning   Progress Towards Goals: Progressing    Interventions: CBT, psychoeducation on boundaries, discharge planning   Summary: Courtney [name] is a 32 year old female that presented for therapy appointment Courtney is diagnosed with Bipolar I Disorder, mixed, moderate; Borderline personality disorder; PTSD; Cannabis Use Disorder, moderate; Courtney ADHD, combined type.        Suicidal/Homicidal: None; without intent or plan   Therapist Response: Courtney [name], Courtney [name], Courtney [name].  Courtney [name] presented for session on time Courtney was alert, oriented x5, with no evidence or self-report of active SI/HI or A/V H.  Courtney [name] reported [name] with medications.  Courtney [name] denied any recent use of alcohol, fentanyl, or cocaine.  She reported "occasional weed use" Courtney one event where she took psilocybin mushrooms with a friend, but denied any notable consequences from this.  Courtney inquired about Courtney [name]'s current emotional ratings, Courtney whether she has had any significant changes in thoughts, feelings, or behavior since last check-in.  Rayshonda reported scores of 0/10 for depression, 2/10 for anxiety, 0/10 for anger/irritability Courtney 0/10 for mania.  Courtney [name] denied any recent outbursts or panic attacks. Courtney [name] reported that a recent success has been writing each day in a self-help journal that she bought.  Courtney [name] reported that a recent struggle has been dealing with the loss of a friendship, which has brought up strong feelings at times.  Courtney utilized a handout on boundaries with Courtney [name] to guide discussion on this subject.  This included defining what boundaries are (i.e. the limits Courtney rules we set for ourselves within relationships), ways to articulate boundaries to others in a respectful, assertive way, Courtney tips for improving approach, such as planning ahead regarding what to say Courtney how to say it, seeking compromise when appropriate, Courtney using confident body language.  Intervention was effective, as evidenced by Courtney [name] expressing receptiveness to this material, reporting that she would work to improve overall boundaries with her network to protect her mental health, including romantic partners, stating "I have to remind myself that others might be hurting, or projecting, Courtney take it out on me.  I don't want to get as bad as them so I need to be careful how I approach  them".  Courtney informed Anisah that she will be discharged from this provider's care after 12/12/22 due to employment ending with [employer].  Courtney offered to assist Courtney [name] in connecting with a new therapist in order to continue work upon present treatment goals Courtney ensure continuation of care.  Courtney also offered resources that could be useful during Courtney [name]'s transition, including online support groups.  Courtney encouraged Courtney [name] to continue following [name] plan, including outreaching 911, [phone], Courtney/or visiting local hospital for assessment should SI/HI arise Courtney pose risk of harm to self Courtney/or others.  Courtney [name] expressed understanding, agreed to follow [name] plan, Courtney reported that she has been looking for a trauma trained Courtney to transition to, so she will look online for suitable options prior to discharge date.  She stated "I still have room for improvement Courtney plan to look into DBT Courtney trauma work".  Courtney will continue to monitor.       Plan: Meet again in 2 weeks virtually.   Diagnosis: Bipolar I Disorder, mixed, moderate; Borderline personality disorder; PTSD; Cannabis Use Disorder, moderate; Courtney ADHD, combined type.   Collaboration of Care:   No collaboration required at this time.                                                   Patient/Guardian was advised Release of Information must be obtained prior to any record release in order to collaborate their care with an outside provider. Patient/Guardian was advised if they have not already done so to contact the registration department to sign all necessary forms in order for [name] to release information regarding their care.    Consent: Patient/Guardian gives verbal consent for treatment Courtney assignment of benefits for services provided during this visit. Patient/Guardian expressed understanding Courtney agreed to proceed.  [name], [place], LCAS 11/17/22

## 2022-12-01 ENCOUNTER — Ambulatory Visit (HOSPITAL_COMMUNITY): Payer: No Typology Code available for payment source | Admitting: Licensed Clinical Social Worker

## 2022-12-03 ENCOUNTER — Ambulatory Visit (INDEPENDENT_AMBULATORY_CARE_PROVIDER_SITE_OTHER): Payer: No Typology Code available for payment source | Admitting: Licensed Clinical Social Worker

## 2022-12-03 DIAGNOSIS — F902 Attention-deficit hyperactivity disorder, combined type: Secondary | ICD-10-CM

## 2022-12-03 DIAGNOSIS — F3162 Bipolar disorder, current episode mixed, moderate: Secondary | ICD-10-CM

## 2022-12-03 DIAGNOSIS — F1220 Cannabis dependence, uncomplicated: Secondary | ICD-10-CM

## 2022-12-03 DIAGNOSIS — F603 Borderline personality disorder: Secondary | ICD-10-CM

## 2022-12-03 DIAGNOSIS — F4310 Post-traumatic stress disorder, unspecified: Secondary | ICD-10-CM | POA: Diagnosis not present

## 2022-12-03 NOTE — Progress Notes (Signed)
Virtual Visit via Video Note  I connected with Courtney [name] on 12/03/22 at 3:00pm by video enabled telemedicine application and verified that I am speaking with the correct person using two identifiers.   I discussed the limitations, risks, security and privacy concerns of performing an evaluation and management service by video and the availability of in person appointments. I also discussed with the patient that there may be a patient responsible charge related to this service. The patient expressed understanding and agreed to proceed.   I discussed the assessment and treatment plan with the patient. The patient was provided an opportunity to ask questions and all were answered. The patient agreed with the plan and demonstrated an understanding of the instructions.   The patient was advised to call back or seek an in-person evaluation if the symptoms worsen or if the condition fails to improve as anticipated.   I provided 45 minutes of non-face-to-face time during this encounter.   [name], LCSW, LCAS ________________________ THERAPIST PROGRESS NOTE   Session Time: 3:00pm - 3:45pm   Location: Patient: Patient Home Provider: [facility]   Participation Level: Active   Behavioral Response: Alert, casually dressed, euthymic mood/affect    Type of Therapy:  Individual Therapy   Treatment Goals addressed: Depression and Anxiety management; Medication management; Monitoring substance use  Progress Towards Goals: Progressing    Interventions: CBT, problem solving, discharge planning   Summary: Courtney [name] is a 32 year old female that presented for therapy appointment and is diagnosed with Bipolar I Disorder, mixed, moderate; Borderline personality disorder; PTSD; Cannabis Use Disorder, moderate; and ADHD, combined type.        Suicidal/Homicidal: None; without intent or plan   Therapist Response: Clinician met with Courtney [name] for virtual therapy session and assessed for  safety, sobriety, and medication compliance.  Courtney [name] presented for appointment on time and was alert, oriented x5, with no evidence or self-report of active SI/HI or A/V H.  Courtney [name] reported compliance with medications.  Courtney [name] reported that she did have a glass of wine 2 days ago, and the "Occasional bowl of marijuana", but denied using any other substances since last check in.  Clinician inquired about Courtney [name] emotional ratings today, and whether she has had any significant changes in thoughts, feelings, or behavior since previous check-in.  Courtney [name] reported scores of 0/10 for depression, 3/10 for anxiety, 0/10 for anger/irritability and 0/10 for mania.  Courtney [name] denied any recent outbursts or panic attacks. Courtney [name] reported that since news of current provider leaving, she has been making efforts to find a new therapist to transition to, with little luck.  Clinician utilized [employer] therapist finder application with [employer] today to assist.  Clinician virtually shared screen in [employer] application, showing Courtney [name] how this tool could help her sort therapists by important filters relevant to her specific needs, such as those that take Medicaid, have experience in trauma, substance abuse, and personality disorder treatment.  Clinician created a comprehensive list with Courtney [name] of providers which she could outreach this week about availability to schedule initial assessments.  Intervention was effective, as evidenced by Courtney [name]'s active engagement in search and discussion on which providers she perceived would be a good fit for mental health needs, resulting in 11 choices.  Courtney [name] reported that this activity greatly added to her available options and she would plan to make phone calls and/or emails this week about availability to the most qualified ones.  Clinician will continue to monitor.       Plan: Meet  again in 1 week virtually.   Diagnosis: Bipolar I Disorder, mixed,  moderate; Borderline personality disorder; PTSD; Cannabis Use Disorder, moderate; and ADHD, combined type.   Collaboration of Care:   No collaboration required at this time.                                                   Patient/Guardian was advised Release of Information must be obtained prior to any record release in order to collaborate their care with an outside provider. Patient/Guardian was advised if they have not already done so to contact the registration department to sign all necessary forms in order for [name] to release information regarding their care.    Consent: Patient/Guardian gives verbal consent for treatment and assignment of benefits for services provided during this visit. Patient/Guardian expressed understanding and agreed to proceed.  [name], [place], LCAS 12/03/22

## 2022-12-11 ENCOUNTER — Ambulatory Visit (HOSPITAL_COMMUNITY): Payer: No Typology Code available for payment source | Admitting: Licensed Clinical Social Worker

## 2022-12-11 DIAGNOSIS — F1220 Cannabis dependence, uncomplicated: Secondary | ICD-10-CM | POA: Diagnosis not present

## 2022-12-11 DIAGNOSIS — F4310 Post-traumatic stress disorder, unspecified: Secondary | ICD-10-CM | POA: Diagnosis not present

## 2022-12-11 DIAGNOSIS — F603 Borderline personality disorder: Secondary | ICD-10-CM | POA: Diagnosis not present

## 2022-12-11 DIAGNOSIS — F3162 Bipolar disorder, current episode mixed, moderate: Secondary | ICD-10-CM

## 2022-12-11 DIAGNOSIS — F902 Attention-deficit hyperactivity disorder, combined type: Secondary | ICD-10-CM
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
## 2022-12-15 ENCOUNTER — Ambulatory Visit (HOSPITAL_COMMUNITY): Payer: No Typology Code available for payment source | Admitting: Licensed Clinical Social Worker

## 2022-12-24 ENCOUNTER — Ambulatory Visit (INDEPENDENT_AMBULATORY_CARE_PROVIDER_SITE_OTHER): Payer: No Typology Code available for payment source | Admitting: Licensed Clinical Social Worker

## 2022-12-24 DIAGNOSIS — F1220 Cannabis dependence, uncomplicated: Secondary | ICD-10-CM

## 2022-12-24 DIAGNOSIS — F3162 Bipolar disorder, current episode mixed, moderate: Secondary | ICD-10-CM | POA: Diagnosis not present

## 2022-12-24 DIAGNOSIS — F4310 Post-traumatic stress disorder, unspecified: Secondary | ICD-10-CM

## 2022-12-24 DIAGNOSIS — F603 Borderline personality disorder: Secondary | ICD-10-CM | POA: Diagnosis not present

## 2022-12-24 DIAGNOSIS — F902 Attention-deficit hyperactivity disorder, combined type: Secondary | ICD-10-CM

## 2022-12-24 NOTE — Progress Notes (Signed)
Virtual Visit via Video Note  I connected with Lezli Buenrostro on 12/24/22 at 3:00pm by video enabled telemedicine application and verified that I am speaking with the correct person using two identifiers.   I discussed the limitations, risks, security and privacy concerns of performing an evaluation and management service by video and the availability of in person appointments. I also discussed with the patient that there may be a patient responsible charge related to this service. The patient expressed understanding and agreed to proceed.   I discussed the assessment and treatment plan with the patient. The patient was provided an opportunity to ask questions and all were answered. The patient agreed with the plan and demonstrated an understanding of the instructions.   The patient was advised to call back or seek an in-person evaluation if the symptoms worsen or if the condition fails to improve as anticipated.   I provided 45 minutes of non-face-to-face time during this encounter.   [name], LCSW, LCAS ________________________ THERAPIST PROGRESS NOTE   Session Time: 3:00pm - 3:45pm   Location: Patient: Patient Home Provider: [facility]   Participation Level: Active   Behavioral Response: Alert, casually dressed, euthymic mood/affect    Type of Therapy:  Individual Therapy   Treatment Goals addressed: Depression and Anxiety management; Medication management; Monitoring substance use; Improving communication skills   Progress Towards Goals: Progressing   Interventions: CBT, conflict resolution skills    Summary: Tarren Pozo is a 32 year old female that presented for therapy appointment and is diagnosed with Bipolar I Disorder, mixed, moderate; Borderline personality disorder; PTSD; Cannabis Use Disorder, moderate; and ADHD, combined type.        Suicidal/Homicidal: None; without intent or plan   Therapist Response: Clinician met with [name] for virtual therapy  appointment and assessed for safety, sobriety, and medication compliance.  Verne presented for session on time and was alert, oriented x5, with no evidence or self-report of active SI/HI or A/V H.  Dae reported compliance with medications.  Naquisha reported that she has had occasional marijuana and alcohol use, but denied any notable consequences at this time.  Clinician inquired about Helane's current emotional ratings, and whether she has had any significant changes in thoughts, feelings, or behavior since last check-in.  Janye reported scores of 0/10 for depression, 1/10 for anxiety, 0/10 for anger/irritability and 2/10 for mania.  Yailene denied any recent outbursts or panic attacks. Laundyn reported that a recent success was having some tests done for autism, although she is still awaiting the results from this.  She reported that she also spoke with a friend about starting a jewelry business on [employer] to make extra income without putting her government benefits at risk.  She reported that a struggle was having an outburst toward her mother recently.  Clinician inquired about what triggered this event, and any efforts she took to utilize coping skills to intervene.  Parris reported that she had visited her parents, and they asked her for a favor, but they had a disagreement about how to clean some bins.  Clinician reviewed topic of conflict resolution today and virtually shared a handout on subject with [employer] which warned against the 'four horsemen' of communication traps that should be avoided due to tendency to escalate and damage a relationship.  These included criticism, defensiveness, contempt, and stonewalling.  'Antidotes' to these harmful behaviors were offered as healthy replacements to improve communication and understanding, including approaching problems with a gentle startup approach, taking responsibility for one's  behavior, sharing fondness/admiration, and using  self-soothing to calm down and focus on the problem at hand.  Clinician encouraged Camelia to apply this material to recent experiences with conflict that she has faced, consider which approach she utilized, and any changes that she could plan to implement in order to improve overall conflict resolution skills.  Intervention was effective, as evidenced by [name] actively participating in discussion on topic, reporting that although she has engaged in some of these communication traps before like criticism, contempt and defensiveness, she has been handling conflict better, and trying to address issues when they arise with her parents in a calmer way without resorting to 'pettiness or nastiness'.  Caylan expressed receptiveness to alternative strategies offered in order to improve conflict resolution skills, including using "I" statements to avoid coming off as critical, practicing relaxation skills to avoid feeling overwhelmed, and adjusting boundaries with people in her network that are outright disrespectful.  She stated "I can't continuously deal with the disrespect.  I'm not going to tolerate it".  Clinician will continue to monitor.       Plan: Meet again virtually in 2 weeks.   Diagnosis: Bipolar I Disorder, mixed, moderate; Borderline personality disorder; PTSD; Cannabis Use Disorder, moderate; and ADHD, combined type.   Collaboration of Care:   No collaboration required at this time.                                                   Patient/Guardian was advised Release of Information must be obtained prior to any record release in order to collaborate their care with an outside provider. Patient/Guardian was advised if they have not already done so to contact the registration department to sign all necessary forms in order for [name] to release information regarding their care.    Consent: Patient/Guardian gives verbal consent for treatment and assignment of benefits for services provided during  this visit. Patient/Guardian expressed understanding and agreed to proceed.  [name], [place], LCAS 12/24/22

## 2023-01-07 ENCOUNTER — Ambulatory Visit (HOSPITAL_COMMUNITY): Payer: No Typology Code available for payment source | Admitting: Licensed Clinical Social Worker

## 2023-01-07 DIAGNOSIS — F4310 Post-traumatic stress disorder, unspecified: Secondary | ICD-10-CM

## 2023-01-07 DIAGNOSIS — F1220 Cannabis dependence, uncomplicated: Secondary | ICD-10-CM

## 2023-01-07 DIAGNOSIS — F902 Attention-deficit hyperactivity disorder, combined type: Secondary | ICD-10-CM

## 2023-01-07 DIAGNOSIS — F3162 Bipolar disorder, current episode mixed, moderate: Secondary | ICD-10-CM | POA: Diagnosis not present

## 2023-01-07 DIAGNOSIS — F603 Borderline personality disorder: Secondary | ICD-10-CM | POA: Diagnosis not present

## 2023-01-07 NOTE — Progress Notes (Signed)
Virtual Visit via Video Note  I connected with Courtney [name] on 01/07/23 at 3:00pm by video enabled telemedicine application and verified that I am speaking with the correct person using two identifiers.   I discussed the limitations, risks, security and privacy concerns of performing an evaluation and management service by video and the availability of in person appointments. I also discussed with the patient that there may be a patient responsible charge related to this service. The patient expressed understanding and agreed to proceed.   I discussed the assessment and treatment plan with the patient. The patient was provided an opportunity to ask questions and all were answered. The patient agreed with the plan and demonstrated an understanding of the instructions.   The patient was advised to call back or seek an in-person evaluation if the symptoms worsen or if the condition fails to improve as anticipated.   I provided 1 hour of non-face-to-face time during this encounter.   [name], LCSW, LCAS ________________________ THERAPIST PROGRESS NOTE   Session Time: 3:00pm - 4:00pm    Location: Patient: Patient Home Provider: [facility]   Participation Level: Active   Behavioral Response: Alert, casually dressed, depressed mood/affect    Type of Therapy:  Individual Therapy   Treatment Goals addressed: Depression and Anxiety management; Medication management; Monitoring substance use; Psychiatry followup   Progress Towards Goals: Progressing   Interventions: CBT, DBT skills    Summary: Courtney [name] is a 32 year old female that presented for therapy appointment and is diagnosed with Bipolar I Disorder, mixed, moderate; Borderline personality disorder; PTSD; Cannabis Use Disorder, moderate; and ADHD, combined type.        Suicidal/Homicidal: None; without intent or plan   Therapist Response: Clinician met with Courtney [name] for virtual therapy session and assessed for  safety, sobriety, and medication compliance.  Courtney [name] presented for appointment on time and was alert, oriented x5, with no evidence or self-report of active SI/HI or A/V H.  Courtney [name] reported compliance with medications.  Courtney [name] reported that she has abstained from using alcohol or drugs since our last check-in due to upcoming surgery.  Clinician inquired about Courtney [name]'s emotional ratings today, and whether she has had any significant changes in thoughts, feelings, or behavior since previous check-in.  Courtney [name] reported scores of 10/10 for depression, 6/10 for anxiety, 4/10 for irritability and 0/10 for mania.  Courtney [name] denied any recent outbursts or panic attacks. Courtney [name] reported that a recent struggle has been dealing with worsening depression, stating "This weeks been very rough for some reason.  I haven't felt this bad in awhile.  I've been feeling very alone".  She reported that she made an earlier followup appointment to speak with her psychiatrist about medication ineffectiveness, and she is still planning to link with a trauma professional to address PTSD.  Clinician utilized a DBT handout with Courtney [name] to assist.  This handout focused on a distress tolerance approach which is represented by the acronym ACCEPTS, and outlines strategies for distracting oneself from distressing emotions, allowing appropriate time to lesson in intensity and eventually fade away.  Strategies offered included engaging in positive activities, contributing to the wellbeing of others, comparing one's present situation to a previously difficult one to highlight resilience, using mental imagery, and physical grounding.  Clinician assisted Courtney [name] in created a realistic ACCEPTS plan for tackling a distressing emotion such as depression.  Intervention was effective, as evidenced by Courtney [name] in creation of the plan, choosing depression as her emotion of focus, and identifying  several helpful approaches for  distraction, such as reading an interesting book, writing about her feelings in her journal, exercising at the gym, making a thoughtful gift for a friend, making jewelry, reflecting upon past challenges to identify blessings, visualizing herself cuddling with her cat, listening to uplifting music, or taking a brisk walk outside.  Courtney [name] stated "I think these will be really good for distracting me this week.  I feel a lot better now that we got to talk".  Clinician will continue to monitor.       Plan: Meet again virtually in 2 weeks.   Diagnosis: Bipolar I Disorder, mixed, moderate; Borderline personality disorder; PTSD; Cannabis Use Disorder, moderate; and ADHD, combined type.   Collaboration of Care:   No collaboration required at this time.                                                   Patient/Guardian was advised Release of Information must be obtained prior to any record release in order to collaborate their care with an outside provider. Patient/Guardian was advised if they have not already done so to contact the registration department to sign all necessary forms in order for [name] to release information regarding their care.    Consent: Patient/Guardian gives verbal consent for treatment and assignment of benefits for services provided during this visit. Patient/Guardian expressed understanding and agreed to proceed.  [name], LCSW, LCAS 01/07/23

## 2023-01-21 ENCOUNTER — Ambulatory Visit (HOSPITAL_COMMUNITY): Payer: No Typology Code available for payment source | Admitting: Licensed Clinical Social Worker

## 2023-01-21 DIAGNOSIS — F902 Attention-deficit hyperactivity disorder, combined type: Secondary | ICD-10-CM

## 2023-01-21 DIAGNOSIS — F3162 Bipolar disorder, current episode mixed, moderate: Secondary | ICD-10-CM | POA: Diagnosis not present

## 2023-01-21 DIAGNOSIS — F4310 Post-traumatic stress disorder, unspecified: Secondary | ICD-10-CM | POA: Diagnosis not present

## 2023-01-21 DIAGNOSIS — F603 Borderline personality disorder: Secondary | ICD-10-CM | POA: Diagnosis not present

## 2023-01-21 DIAGNOSIS — F1220 Cannabis dependence, uncomplicated: Secondary | ICD-10-CM

## 2023-01-21 NOTE — Progress Notes (Signed)
Virtual Visit via Video Note  I connected with Moniquea Stiteler on 01/21/23 at 3:00pm by video enabled telemedicine application and verified that I am speaking with the correct person using two identifiers.   I discussed the limitations, risks, security and privacy concerns of performing an evaluation and management service by video and the availability of in person appointments. I also discussed with the patient that there may be a patient responsible charge related to this service. The patient expressed understanding and agreed to proceed.   I discussed the assessment and treatment plan with the patient. The patient was provided an opportunity to ask questions and all were answered. The patient agreed with the plan and demonstrated an understanding of the instructions.   The patient was advised to call back or seek an in-person evaluation if the symptoms worsen or if the condition fails to improve as anticipated.   I provided 45 minutes of non-face-to-face time during this encounter.   [name], LCSW, LCAS ________________________ THERAPIST PROGRESS NOTE   Session Time: 3:00pm - 3:45pm   Location: Patient: Patient Home Provider: [facility]   Participation Level: Active   Behavioral Response: Alert, casually dressed, anxious mood/affect    Type of Therapy:  Individual Therapy   Treatment Goals addressed: Depression and Anxiety management; Medication management; Monitoring substance use  Progress Towards Goals: Progressing   Interventions: CBT: challenging anxious thoughts    Summary: Haide Ormsby is a 32 year old female that presented for therapy appointment and is diagnosed with Bipolar I Disorder, mixed, moderate; Borderline personality disorder; PTSD; Cannabis Use Disorder, moderate; and ADHD, combined type.        Suicidal/Homicidal: None; without intent or plan   Therapist Response: Clinician met with [name] for virtual therapy appointment and assessed  for safety, sobriety, and medication compliance.  Montserrat presented for session on time and was alert, oriented x5, with no evidence or self-report of active SI/HI or A/V H.  Demetrice reported compliance with medications.  Lilie reported that she has abstained from using alcohol or drugs since our last session to stay on track for upcoming surgery.  Clinician inquired about Elizabeht's current emotional ratings, and whether she has had any significant changes in thoughts, feelings, or behavior since last check-in.  Arleene reported scores of 1/10 for depression, 10/10 for anxiety, 7/10 for irritability and 2/10 for mania.  Malaysiah denied any recent outbursts. Nannie reported that recent successes have included making more time for self-care such as journaling, meditating, and exercising, stating "I feel more at peace after doing these things".  She reported that she has also [employer] about therapists that are certified to do trauma and DBT, and was given a list of names to to outreach.  Yulie reported that several of these appear well qualified, but she worries about transitioning to a new clinician.  Clinician utilized handout in session today titled "Worry exploration" in order to assist Evamae in reducing her anxiety related to connecting with a new therapist.  This worksheet featured a series of Socratic questions aimed at exploring the most likely outcomes for a situation of concern, rather than focusing on the worst possible outcome (i.e. catastrophizing).  Clinician assisted Teagan in identifying and challenging any irrational beliefs related to this worry, in addition to utilizing problem solving approach to explore strategies which would help her accomplish goal of beginning work with a new professional to address PTSD and Borderline traits more effectively.  Aislyn actively participated in discussion on handout, reporting that  she is worried that she will not like  this person, or feel overwhelmed by addressing these issues she has tried to repress for several years.  Nickolette reported that there is sufficient evidence to suggest she will be okay, as she is doing her research on these therapists beforehand to see if they are qualified, and she acknowledged that connecting with current therapist was a "Leap of faith", but helped her build trust and confidence in the process.  Intervention was effective, as evidenced by [name] reporting that discussion on this subject reduced her anxiety down to 7/10, and increased her confidence in her ability to follow through on referrals for trauma informed professionals to begin therapy with.  Tene reported that she would inform current provider if she schedules an initial assessment, and understood discharge process.  Natania stated "I know I have to let go to become better.  I keep saying I'm scared, but I know this will help me".  Clinician will continue to monitor.       Plan: Meet again virtually in 2 weeks.   Diagnosis: Bipolar I Disorder, mixed, moderate; Borderline personality disorder; PTSD; Cannabis Use Disorder, moderate; and ADHD, combined type.   Collaboration of Care:   No collaboration required at this time.                                                   Patient/Guardian was advised Release of Information must be obtained prior to any record release in order to collaborate their care with an outside provider. Patient/Guardian was advised if they have not already done so to contact the registration department to sign all necessary forms in order for [name] to release information regarding their care.    Consent: Patient/Guardian gives verbal consent for treatment and assignment of benefits for services provided during this visit. Patient/Guardian expressed understanding and agreed to proceed.  [name], LCSW, LCAS 01/21/23

## 2023-02-02 ENCOUNTER — Encounter (HOSPITAL_COMMUNITY): Payer: Self-pay

## 2023-02-02 ENCOUNTER — Ambulatory Visit (HOSPITAL_COMMUNITY): Payer: No Typology Code available for payment source | Admitting: Licensed Clinical Social Worker

## 2023-03-09 ENCOUNTER — Ambulatory Visit (HOSPITAL_COMMUNITY): Payer: MEDICAID | Admitting: Licensed Clinical Social Worker

## 2023-03-09 DIAGNOSIS — F603 Borderline personality disorder: Secondary | ICD-10-CM | POA: Diagnosis not present

## 2023-03-09 DIAGNOSIS — F4310 Post-traumatic stress disorder, unspecified: Secondary | ICD-10-CM | POA: Diagnosis not present

## 2023-03-09 DIAGNOSIS — F3162 Bipolar disorder, current episode mixed, moderate: Secondary | ICD-10-CM

## 2023-03-09 DIAGNOSIS — F902 Attention-deficit hyperactivity disorder, combined type: Secondary | ICD-10-CM

## 2023-03-09 DIAGNOSIS — F1220 Cannabis dependence, uncomplicated: Secondary | ICD-10-CM

## 2023-03-09 NOTE — Progress Notes (Signed)
Virtual Visit via Video Note  I connected with Courtney [name] on 03/09/23 at 1:00pm by video enabled telemedicine application and verified that I am speaking with the correct person using two identifiers.   I discussed the limitations, risks, security and privacy concerns of performing an evaluation and management service by video and the availability of in person appointments. I also discussed with the patient that there may be a patient responsible charge related to this service. The patient expressed understanding and agreed to proceed.   I discussed the assessment and treatment plan with the patient. The patient was provided an opportunity to ask questions and all were answered. The patient agreed with the plan and demonstrated an understanding of the instructions.   The patient was advised to call back or seek an in-person evaluation if the symptoms worsen or if the condition fails to improve as anticipated.   I provided 30 minutes of non-face-to-face time during this encounter.   Courtney Stain, LCSW, LCAS ________________________ THERAPIST PROGRESS NOTE   Session Time: 1:00pm - 1:30pm    Location: Patient: Patient Home Provider: [facility]   Participation Level: Active   Behavioral Response: Alert, casually dressed, depressed mood/affect   Type of Therapy:  Individual Therapy   Treatment Goals addressed: Depression and Anxiety management; Medication management; Monitoring substance use  Progress Towards Goals: Progressing   Interventions: CBT, problem solving, supportive therapy    Summary: Courtney [name] is a 32 year old female that presented for therapy appointment and is diagnosed with Bipolar I Disorder, mixed, moderate; Borderline personality disorder; PTSD; Cannabis Use Disorder, moderate; and ADHD, combined type.        Suicidal/Homicidal: None; without intent or plan   Therapist Response: Clinician met with Courtney [name] for virtual therapy session and assessed for  safety, sobriety, and medication compliance.  Courtney [name] presented for appointment on time and was alert, oriented x5, with no evidence or self-report of active SI/HI or A/V H.  Courtney [name] reported compliance with medications.  Courtney [name] reported that she has abstained from using alcohol or drugs since in over 1 month, stating "I have had the occasional beer but I'm staying away from drugs because of my surgery".  Clinician inquired about Courtney [name]'s emotional ratings today, and whether she has had any significant changes in thoughts, feelings, or behavior since previous check-in.  Courtney [name] reported scores of 6/10 for depression, 2/10 for anxiety, 2/10 for anger/irritability and 0/10 for mania.  Courtney [name] denied any recent outbursts or panic attacks. Courtney [name] reported that recent struggle has been having to wait for her therapy session today, as she forgot to schedule again for some time now.  She reported that lately she has been in 'a funk', and credited this to falling out of her self-care routine, since she has been oversleeping, and spending less time on activities she enjoyed, such as Nutritional therapist or meditating.  Clinician introduced topic of creating mental health maintenance plan today.  Clinician provided handout on subject to Courtney [name], which stressed the importance of maintaining one's mental health in a similar way to using diet and exercise to ensure physical health.  Clinician walked members through process of identifying triggers which could worsen symptoms, including specific people, places, and things one needs to avoid.  Members were also tasked with identifying warning signs such as thoughts, feelings, or behaviors which could indicate mental health is at increased risk.  Clinician also facilitated conversation on self-care activities and coping strategies which Courtney [name] has previously utilized in the past, are currently  using in daily routine, or plan to use soon to assist with managing  problems or symptoms when/if they appear.  Clinician encouraged Courtney [name] to revisit her maintenance plan often and make changes as needed to ensure day to day stability.  Intervention was effective, as evidenced by Courtney [name] participating in activity and creating a comprehensive plan, including identification of triggers such as being yelled at or spoken to in a condescending manner, physical violence or threats, being around a jail or court setting, or seeing a police officer, and warning signs including becoming disorganized, hyperventilating, feeling more depressed, sleeping more often, and isolating from others.  Courtney [name] also reported that they would make an effort to set aside time for self-care activities such as exercising, following a healthy diet, reading, spending quality time with friends,  and use coping skills such as meditating, listening to relaxing music, or creating some form of artwork to manage stressors.  Courtney [name] reported that tomorrow she would begin making changes to her self-care routine in order to prioritize her mental health, stating "I should go off social media for awhile too.  I want to focus on myself more".  Clinician will continue to monitor.       Plan: Meet again virtually in 2 weeks.   Diagnosis: Bipolar I Disorder, mixed, moderate; Borderline personality disorder; PTSD; Cannabis Use Disorder, moderate; and ADHD, combined type.   Collaboration of Care:   No collaboration required at this time.                                                   Patient/Guardian was advised Release of Information must be obtained prior to any record release in order to collaborate their care with an outside provider. Patient/Guardian was advised if they have not already done so to contact the registration department to sign all necessary forms in order for [name] to release information regarding their care.    Consent: Patient/Guardian gives verbal consent for treatment and assignment of  benefits for services provided during this visit. Patient/Guardian expressed understanding and agreed to proceed.  Courtney Stain, LCSW, LCAS 03/09/23

## 2023-03-23 ENCOUNTER — Ambulatory Visit (HOSPITAL_COMMUNITY): Payer: MEDICAID | Admitting: Licensed Clinical Social Worker

## 2023-03-23 ENCOUNTER — Encounter (HOSPITAL_COMMUNITY): Payer: Self-pay

## 2023-03-27 ENCOUNTER — Ambulatory Visit (INDEPENDENT_AMBULATORY_CARE_PROVIDER_SITE_OTHER): Payer: MEDICAID | Admitting: Licensed Clinical Social Worker

## 2023-03-27 DIAGNOSIS — F4310 Post-traumatic stress disorder, unspecified: Secondary | ICD-10-CM

## 2023-03-27 DIAGNOSIS — F603 Borderline personality disorder: Secondary | ICD-10-CM | POA: Diagnosis not present

## 2023-03-27 DIAGNOSIS — F3162 Bipolar disorder, current episode mixed, moderate: Secondary | ICD-10-CM

## 2023-03-27 DIAGNOSIS — F902 Attention-deficit hyperactivity disorder, combined type: Secondary | ICD-10-CM

## 2023-03-27 DIAGNOSIS — F1220 Cannabis dependence, uncomplicated: Secondary | ICD-10-CM

## 2023-03-27 NOTE — Progress Notes (Signed)
Virtual Visit via Video Note  I connected with Dedorah Salom on 03/27/23 at 11:00am by video enabled telemedicine application and verified that I am speaking with the correct person using two identifiers.   I discussed the limitations, risks, security and privacy concerns of performing an evaluation and management service by video and the availability of in person appointments. I also discussed with the patient that there may be a patient responsible charge related to this service. The patient expressed understanding and agreed to proceed.   I discussed the assessment and treatment plan with the patient. The patient was provided an opportunity to ask questions and all were answered. The patient agreed with the plan and demonstrated an understanding of the instructions.   The patient was advised to call back or seek an in-person evaluation if the symptoms worsen or if the condition fails to improve as anticipated.   I provided 1 hour of non-face-to-face time during this encounter.   [name], LCSW, LCAS ________________________ THERAPIST PROGRESS NOTE   Session Time: 11:00am - 12:00pm    Location: Patient: Patient Home Provider: [facility]   Participation Level: Active   Behavioral Response: Alert, casually dressed, manic mood/affect    Type of Therapy:  Individual Therapy   Treatment Goals addressed: Depression and Anxiety management; Medication management; Improving communication skills and boundaries  Progress Towards Goals: Progressing   Interventions: CBT, problem solving, psychoeducation on healthy boundaries    Summary: Ottie Gurecki is a 32 year old female that presented for therapy appointment and is diagnosed with Bipolar I Disorder, mixed, moderate; Borderline personality disorder; PTSD; Cannabis Use Disorder, moderate; and ADHD, combined type.        Suicidal/Homicidal: None; without intent or plan   Therapist Response: Clinician met with [name] for  virtual therapy session and assessed for safety, sobriety, and medication compliance.  Cybill presented for appointment on time and was alert, oriented x5, with no evidence or self-report of active SI/HI or A/V H.  Mataya reported compliance with medications.  Roslynn reported that she has continued abstaining from alcohol or drug use since our last session to stay on track for upcoming surgery.  Clinician inquired about Elliett's emotional ratings today, and whether she has had any significant changes in thoughts, feelings, or behavior since previous check-in.  Monzerat reported scores of 2/10 for depression, 2/10 for anxiety, 1/10 for anger/irritability and 4/10 for mania.  Uchechi reported that she has experienced outbursts and panic attacks, despite these low ratings. Clinician inquired about what external or internal triggers may have influenced these mood changes, as well as coping skills she has utilized to manage them.  Quinita reported that she recently realized that she has fallen in love with her roommate, and experienced strong feelings of abandonment when he had to house sit for his parents, and the distance led her to have panic attacks.  Joan reported that when he expressed her feelings and this was not reciprocated, she got very upset, stating "I think I made him really uncomfortable.  He is in no position to have a relationship and wants me to work on myself".  Clinician reviewed material on subject of boundaries with Marelly today.  This handout defined boundaries as the limits and rules that we set for ourselves within relationships, and featured a breakdown of the 3 common categories of boundaries (i.e. porous, rigid, and healthy), along with typical traits specific to each one for easy identification.  It was noted that most people have a mixture  of different boundary types depending on setting, person, and culture.  Clinician tasked [name] with identifying which types  of boundaries she presently has within her own support system, the collective impact these boundaries have upon her mental health, and changes that could be made in order to more effectively communicate her mental health needs with supports like her roommate.  Intervention was effective, as evidenced by [name] actively engaging in discussion on subject, and reporting that this helped her build insight into why there are problems in the friendship, which validates her frustration.  She reported that her roommate has numerous traits of rigid boundaries due to a failed marriage, which has led him to avoid intimacy, be protective of personal thoughts and feelings, and seem detached to other people, including on a romantic nature.  Lareina reported that in contrast, she has porous boundaries that clash with him, since she has trouble saying "No" to others, is dependent on their opinions, accepting of disrespect, and fears rejection if she doesn't comply.  Kitiara reported that when her roommate returns, she will try to find an appropriate time to sit down and address these issues in an assertive, calm way in order to seek closure.  Azalia stated "I need to learn to control my borderline personality.  I had no boundaries, and it was getting toxic".  Clinician will continue to monitor.       Plan: Meet again virtually in 2 weeks.   Diagnosis: Bipolar I Disorder, mixed, moderate; Borderline personality disorder; PTSD; Cannabis Use Disorder, moderate; and ADHD, combined type.   Collaboration of Care:   No collaboration required at this time.                                                   Patient/Guardian was advised Release of Information must be obtained prior to any record release in order to collaborate their care with an outside provider. Patient/Guardian was advised if they have not already done so to contact the registration department to sign all necessary forms in order for [name] to release  information regarding their care.    Consent: Patient/Guardian gives verbal consent for treatment and assignment of benefits for services provided during this visit. Patient/Guardian expressed understanding and agreed to proceed.  [name], [place], LCAS 03/27/23

## 2023-04-06 ENCOUNTER — Encounter (HOSPITAL_COMMUNITY): Payer: Self-pay

## 2023-04-06 ENCOUNTER — Ambulatory Visit (HOSPITAL_COMMUNITY): Payer: MEDICAID | Admitting: Licensed Clinical Social Worker

## 2023-04-20 ENCOUNTER — Ambulatory Visit (HOSPITAL_COMMUNITY): Payer: MEDICAID | Admitting: Licensed Clinical Social Worker

## 2023-04-20 ENCOUNTER — Encounter (HOSPITAL_COMMUNITY): Payer: Self-pay

## 2023-04-28 ENCOUNTER — Ambulatory Visit (HOSPITAL_COMMUNITY): Payer: MEDICAID | Admitting: Licensed Clinical Social Worker

## 2023-05-04 ENCOUNTER — Ambulatory Visit (INDEPENDENT_AMBULATORY_CARE_PROVIDER_SITE_OTHER): Payer: MEDICAID | Admitting: Licensed Clinical Social Worker

## 2023-05-04 DIAGNOSIS — F4310 Post-traumatic stress disorder, unspecified: Secondary | ICD-10-CM | POA: Diagnosis not present

## 2023-05-04 DIAGNOSIS — F1220 Cannabis dependence, uncomplicated: Secondary | ICD-10-CM | POA: Diagnosis not present

## 2023-05-04 DIAGNOSIS — F902 Attention-deficit hyperactivity disorder, combined type: Secondary | ICD-10-CM

## 2023-05-04 DIAGNOSIS — F3162 Bipolar disorder, current episode mixed, moderate: Secondary | ICD-10-CM | POA: Diagnosis not present

## 2023-05-04 DIAGNOSIS — F603 Borderline personality disorder: Secondary | ICD-10-CM

## 2023-05-04 NOTE — Progress Notes (Signed)
Virtual Visit via Video Note  I connected with Courtney [name] on 05/04/23 at 1:00pm by video enabled telemedicine application and verified that I am speaking with the correct person using two identifiers.   I discussed the limitations, risks, security and privacy concerns of performing an evaluation and management service by video and the availability of in person appointments. I also discussed with the patient that there may be a patient responsible charge related to this service. The patient expressed understanding and agreed to proceed.   I discussed the assessment and treatment plan with the patient. The patient was provided an opportunity to ask questions and all were answered. The patient agreed with the plan and demonstrated an understanding of the instructions.   The patient was advised to call back or seek an in-person evaluation if the symptoms worsen or if the condition fails to improve as anticipated.   I provided 1 hour of non-face-to-face time during this encounter.   Courtney Stain, LCSW, LCAS ________________________ THERAPIST PROGRESS NOTE   Session Time: 1:00pm - 2:00pm     Location: Patient: Patient Home Provider: Home Office   Participation Level: Active   Behavioral Response: Alert, casually dressed, euthymic mood/affect    Type of Therapy:  Individual Therapy   Treatment Goals addressed: Depression and Anxiety management; Medication management  Progress Towards Goals: Progressing   Interventions: CBT, relapse prevention planning    Summary: Courtney [name] is a 32 year old female that presented for therapy appointment and is diagnosed with Bipolar I Disorder, mixed, moderate; Borderline personality disorder; PTSD; Cannabis Use Disorder, moderate; and ADHD, combined type.        Suicidal/Homicidal: None; without intent or plan   Therapist Response: Clinician met with Courtney [name] for virtual therapy appointment and assessed for safety, sobriety, and medication  compliance.  Courtney [name] presented for session on time and was alert, oriented x5, with no evidence or self-report of active SI/HI or A/V H.  Courtney [name] reported compliance with medications.  Courtney [name] reported that she has continued abstaining from alcohol or drug use since our previous session to avoid having her surgery cancelled.  Clinician inquired about Courtney [name]'s current emotional ratings, and whether she has had any significant changes in thoughts, feelings, or behavior since last check-in.  Courtney [name] reported scores of 0/10 for depression, 0/10 for anxiety, 0/10 for anger/irritability and 2/10 for mania.  Courtney [name] denied any recent outbursts or panic attacks.  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  She reported that this has been difficult at times and she can be tempted to use THC or alcohol as well despite extended length of sobriety.  Clinician encouraged Courtney [name] to abstain from use of foods that are not allowed in her post-surgery diet due to the potential health risks/complications that could result from eating them.  Clinician assisted Courtney [name] in creating a relapse prevention plan by discussing topic of external and internal triggers she should be mindful of, exploration of healthier coping skills to substitute, and refusal skills to practice if she feels tempted to eat any off-limit dietary items.  Clinician also reminded Courtney [name] of importance in being compliant with all prescribed medications, and negative impact illicit substances could have on both physical and mental health.  Clinician discussed strategies for improving compliance, including use of a calendar, reminders on her phone, or supports for greater accountability.  Interventions were effective, as evidenced by Courtney [name] actively engaging in discussion on subject,  and creation of relapse prevention plan based upon her doctor's  feedback.  Courtney [name] reported that she needs to be mindful of triggers that could lead her to 'cheat' on her diet, such as feeling depressed, or more stressed, or seeing media that involves unhealthy foods she would normally eat for comfort.  She reported that she would continue practicing coping skills learned from therapy such as deep breathing to manage urges, and 'play the tape forward' to weigh the pros and cons of her actions.  Courtney [name] stated "I don't want to risk anything.  I want to play it safe".  Clinician will continue to monitor.       Plan: Meet again virtually in 2 weeks.   Diagnosis: Bipolar I Disorder, mixed, moderate; Borderline personality disorder; PTSD; Cannabis Use Disorder, moderate; and ADHD, combined type.   Collaboration of Care:   No collaboration required at this time.                                                   Patient/Guardian was advised Release of Information must be obtained prior to any record release in order to collaborate their care with an outside provider. Patient/Guardian was advised if they have not already done so to contact the registration department to sign all necessary forms in order for [name] to release information regarding their care.    Consent: Patient/Guardian gives verbal consent for treatment and assignment of benefits for services provided during this visit. Patient/Guardian expressed understanding and agreed to proceed.  Courtney [name], [place], LCAS 05/04/23

## 2023-05-11 ENCOUNTER — Ambulatory Visit (INDEPENDENT_AMBULATORY_CARE_PROVIDER_SITE_OTHER): Payer: MEDICAID | Admitting: Licensed Clinical Social Worker

## 2023-05-11 DIAGNOSIS — F1220 Cannabis dependence, uncomplicated: Secondary | ICD-10-CM | POA: Diagnosis not present

## 2023-05-11 DIAGNOSIS — F4310 Post-traumatic stress disorder, unspecified: Secondary | ICD-10-CM | POA: Diagnosis not present

## 2023-05-11 DIAGNOSIS — F902 Attention-deficit hyperactivity disorder, combined type: Secondary | ICD-10-CM

## 2023-05-11 DIAGNOSIS — F3162 Bipolar disorder, current episode mixed, moderate: Secondary | ICD-10-CM | POA: Diagnosis not present

## 2023-05-11 DIAGNOSIS — F603 Borderline personality disorder: Secondary | ICD-10-CM | POA: Diagnosis not present

## 2023-05-11 NOTE — Progress Notes (Signed)
Virtual Visit via Video Note  I connected with Courtney [name] on 05/11/23 at 3:00pm by video enabled telemedicine application and verified that I am speaking with the correct person using two identifiers.   I discussed the limitations, risks, security and privacy concerns of performing an evaluation and management service by video and the availability of in person appointments. I also discussed with the patient that there may be a patient responsible charge related to this service. The patient expressed understanding and agreed to proceed.   I discussed the assessment and treatment plan with the patient. The patient was provided an opportunity to ask questions and all were answered. The patient agreed with the plan and demonstrated an understanding of the instructions.   The patient was advised to call back or seek an in-person evaluation if the symptoms worsen or if the condition fails to improve as anticipated.   I provided 52 minutes of non-face-to-face time during this encounter.   [name], LCSW, LCAS ________________________ THERAPIST PROGRESS NOTE   Session Time: 3:00pm - 3:52pm    Location: Patient: Patient Home Provider: Home Office   Participation Level: Active   Behavioral Response: Alert, casually dressed, irritable mood/affect    Type of Therapy:  Individual Therapy   Treatment Goals addressed: Depression and Anxiety management; Medication management; Improving communication skills   Progress Towards Goals: Progressing   Interventions: CBT, communication skills     Summary: Courtney [name] is a 32 year old female that presented for therapy appointment and is diagnosed with Bipolar I Disorder, mixed, moderate; Borderline personality disorder; PTSD; Cannabis Use Disorder, moderate; and ADHD, combined type.        Suicidal/Homicidal: None; without intent or plan   Therapist Response: Courtney [name] met with Courtney [name] for virtual therapy session and assessed for safety,  sobriety, and medication compliance.  Courtney [name] presented for appointment on time and was alert, oriented x5, with no evidence or self-report of active SI/HI or A/V H.  Courtney [name] reported compliance with medications.  Courtney [name] reported that she has continued abstaining from alcohol or drug use since our previous session to avoid complications following recent surgery.  Courtney [name] inquired about Courtney [name]'s emotional ratings today, and whether she has had any significant changes in thoughts, feelings, or behavior since previous check-in.  Courtney [name] reported scores of 0/10 for depression, 1/10 for anxiety, 4/10 for irritability and 0/10 for mania.  Courtney [name] denied any recent outbursts or panic attacks.  Courtney [name] reported that a recent struggle has been trying to communication with her roommate, as they always seem to get into arguments.  Courtney [name] stated "I can be very passive aggressive.  Its like I want to start something".  Courtney [name] discussed topic of communication styles to assist, and used a handout on passive aggressive style to guide discussion.  This handout defined passive aggressive behavior as non-verbal aggression that manifests in negative behavior, and instead of communicating honestly how one feels, an individual may resort to numerous tactics which can eventually lead the relationship to break down. Several traits of passive aggressive communicators were noted to help determine severity and scope of issues, including examples such as ambiguity, chronic lateness, making excuses, victimization, self-pity, evasion of issues, and more.  Several healthy approaches were offered for correcting any associated problematic behavior that might lead to more issues, including increased insight into underlying feelings, the consequences this approach can have, the importance of taking responsibility for one's actions and reactions, and learning to be more assertive.  Intervention was effective, as evidenced by  Courtney [name] active engagement in discussion on subject, and receptiveness to suggestions offered for improving communication style.  Courtney [name] reported that she has been trying to be more assertive in order to express herself in respectful, healthy ways, but her roommate can either be rude, or stonewall, and trigger her at times.  She stated "I can get very sarcastic.  This teaches me how to say something the right way without adding to the issues we already have".  Courtney [name] will continue to monitor.       Plan: Meet again virtually in 2 weeks.   Diagnosis: Bipolar I Disorder, mixed, moderate; Borderline personality disorder; PTSD; Cannabis Use Disorder, moderate; and ADHD, combined type.   Collaboration of Care:   No collaboration required at this time.                                                   Patient/Guardian was advised Release of Information must be obtained prior to any record release in order to collaborate their care with an outside provider. Patient/Guardian was advised if they have not already done so to contact the registration department to sign all necessary forms in order for [name] to release information regarding their care.    Consent: Patient/Guardian gives verbal consent for treatment and assignment of benefits for services provided during this visit. Patient/Guardian expressed understanding and agreed to proceed.  [name], LCSW, LCAS 05/11/23

## 2023-06-18 ENCOUNTER — Ambulatory Visit (HOSPITAL_COMMUNITY): Payer: MEDICAID | Admitting: Licensed Clinical Social Worker

## 2023-06-29 ENCOUNTER — Ambulatory Visit (INDEPENDENT_AMBULATORY_CARE_PROVIDER_SITE_OTHER): Payer: MEDICAID | Admitting: Licensed Clinical Social Worker

## 2023-06-29 ENCOUNTER — Encounter (HOSPITAL_COMMUNITY): Payer: Self-pay

## 2023-06-29 DIAGNOSIS — F603 Borderline personality disorder: Secondary | ICD-10-CM | POA: Diagnosis not present

## 2023-06-29 DIAGNOSIS — F1290 Cannabis use, unspecified, uncomplicated: Secondary | ICD-10-CM | POA: Diagnosis not present

## 2023-06-29 DIAGNOSIS — F3162 Bipolar disorder, current episode mixed, moderate: Secondary | ICD-10-CM | POA: Diagnosis not present

## 2023-06-29 DIAGNOSIS — F4310 Post-traumatic stress disorder, unspecified: Secondary | ICD-10-CM | POA: Diagnosis not present

## 2023-06-29 DIAGNOSIS — F902 Attention-deficit hyperactivity disorder, combined type: Secondary | ICD-10-CM

## 2023-06-29 NOTE — Progress Notes (Signed)
Virtual Visit via Video Note  I connected with Courtney [name] on 06/29/23 at 3:00pm by video enabled telemedicine application and verified that I am speaking with the correct person using two identifiers.   I discussed the limitations, risks, security and privacy concerns of performing an evaluation and management service by video and the availability of in person appointments. I also discussed with the patient that there may be a patient responsible charge related to this service. The patient expressed understanding and agreed to proceed.   I discussed the assessment and treatment plan with the patient. The patient was provided an opportunity to ask questions and all were answered. The patient agreed with the plan and demonstrated an understanding of the instructions.   The patient was advised to call back or seek an in-person evaluation if the symptoms worsen or if the condition fails to improve as anticipated.   I provided 31 minutes of non-face-to-face time during this encounter.   [name], LCSW, LCAS ________________________ THERAPIST PROGRESS NOTE   Session Time: 3:00pm - 3:31pm  Location: Patient: Patient Home Provider: Home Office    Participation Level: Active   Behavioral Response: Alert, casually dressed, euthymic mood/affect   Type of Therapy:  Individual Therapy   Treatment Goals addressed: Depression and Anxiety management; Medication management  Progress Towards Goals: Progressing   Interventions: CBT, psychoeducation on healthy relationships    Summary: Courtney [name] is a 32 year old female that presented for therapy appointment and is diagnosed with Bipolar I Disorder, mixed, moderate; Borderline personality disorder; PTSD; Cannabis Use Disorder, moderate; and ADHD, combined type.        Suicidal/Homicidal: None; without intent or plan   Therapist Response: Clinician met with Courtney [name] for virtual therapy appointment and assessed for safety, sobriety,  and medication compliance.  Courtney [name] presented for session on time and was alert, oriented x5, with no evidence or self-report of active SI/HI or A/V H.  Courtney [name] reported compliance with medications.  Courtney [name] reported that she has been trying to limit alcohol and marijuana use to social encounters with friends.  Clinician inquired about Courtney [name]'s current emotional ratings, and whether she has had any significant changes in thoughts, feelings, or behavior since last check-in.  Courtney [name] reported scores of 0/10 for depression, 0/10 for anxiety, 0/10 for anger/irritability and 0/10 for mania.  Courtney [name] denied any recent outbursts or panic attacks.  Courtney [name] reported that a recent success was starting to speak with her brother again, and finding that they are getting along better than before.  Courtney [name] reported that she also has a new boyfriend that she intends to introduce to her family on Thanksgiving.  She reported that he lives in [place], and since the relationship is still new and distance is a barrier, she is trying to be mindful of any red flags that might be present.  Clinician reviewed psychoeducation on traits of healthy relationships using a handout to guide discussion with Courtney [name] on the subject.  This handout explained how attachments to connections within one's support network (i.e. friends, family, romantic partners, coworkers, Catering manager) can influence one's mental health, and emphasized importance of looking for green lights (positive behaviors) that can be considered normal, as well as red lights (harmful behaviors) which suggest that a boundary should be established. Courtney [name] was tasked with identifying green lights (i.e. respect, trust, appreciation, healthy conflict resolution, etc) and red lights (i.e. contempt, suspicion, impatience, lack of growth, etc) that are present in developing in relationship in order to help her determine how to  adjust boundaries accordingly.  Intervention was  effective, as evidenced by Courtney [name] actively participating in discussion on subject, and reporting that at present, there are no red flags that need to be considered, as her boyfriend has been respectful, honest, open with communication, appreciative, and able to empathize with her regarding her own struggles, since he also has history of mental health issues.  Courtney [name] reported that this session put her mind at ease and made her feel more comfortable about the progression of the relationship.  Clinician will continue to monitor.       Plan: Meet again virtually in 2 weeks.   Diagnosis: Bipolar I Disorder, mixed, moderate; Borderline personality disorder; PTSD; Cannabis Use Disorder, moderate; and ADHD, combined type.   Collaboration of Care:   No collaboration required at this time.                                                   Patient/Guardian was advised Release of Information must be obtained prior to any record release in order to collaborate their care with an outside provider. Patient/Guardian was advised if they have not already done so to contact the registration department to sign all necessary forms in order for [name] to release information regarding their care.    Consent: Patient/Guardian gives verbal consent for treatment and assignment of benefits for services provided during this visit. Patient/Guardian expressed understanding and agreed to proceed.  [name], [place], LCAS 06/29/23

## 2023-07-13 ENCOUNTER — Ambulatory Visit (HOSPITAL_COMMUNITY): Payer: MEDICAID | Admitting: Licensed Clinical Social Worker

## 2023-07-27 ENCOUNTER — Ambulatory Visit (INDEPENDENT_AMBULATORY_CARE_PROVIDER_SITE_OTHER): Payer: MEDICAID | Admitting: Licensed Clinical Social Worker

## 2023-07-27 DIAGNOSIS — F1220 Cannabis dependence, uncomplicated: Secondary | ICD-10-CM | POA: Diagnosis not present

## 2023-07-27 DIAGNOSIS — F603 Borderline personality disorder: Secondary | ICD-10-CM

## 2023-07-27 DIAGNOSIS — F4310 Post-traumatic stress disorder, unspecified: Secondary | ICD-10-CM

## 2023-07-27 DIAGNOSIS — F3162 Bipolar disorder, current episode mixed, moderate: Secondary | ICD-10-CM | POA: Diagnosis not present

## 2023-07-27 DIAGNOSIS — F902 Attention-deficit hyperactivity disorder, combined type: Secondary | ICD-10-CM

## 2023-07-27 NOTE — Progress Notes (Signed)
Virtual Visit via Video Note  I connected with Courtney [name] on 07/27/23 at 3:00pm by video enabled telemedicine application and verified that I am speaking with the correct person using two identifiers.   I discussed the limitations, risks, security and privacy concerns of performing an evaluation and management service by video and the availability of in person appointments. I also discussed with the patient that there may be a patient responsible charge related to this service. The patient expressed understanding and agreed to proceed.   I discussed the assessment and treatment plan with the patient. The patient was provided an opportunity to ask questions and all were answered. The patient agreed with the plan and demonstrated an understanding of the instructions.   The patient was advised to call back or seek an in-person evaluation if the symptoms worsen or if the condition fails to improve as anticipated.   I provided 47 minutes of non-face-to-face time during this encounter.   Courtney Stain, LCSW, LCAS ________________________ THERAPIST PROGRESS NOTE   Session Time: 3:00pm - 3:47pm   Location: Patient: Patient Home Provider: Home Office    Participation Level: Active   Behavioral Response: Alert, casually dressed, anxious mood/affect   Type of Therapy:  Individual Therapy   Treatment Goals addressed: Depression and Anxiety management; Medication management  Progress Towards Goals: Progressing   Interventions: CBT, problem solving, strengths based     Summary: Courtney [name] is a 32 year old female that presented for therapy appointment and is diagnosed with Bipolar I Disorder, mixed, moderate; Borderline personality disorder; PTSD; Cannabis Use Disorder, moderate; and ADHD, combined type.        Suicidal/Homicidal: None; without intent or plan   Therapist Response: Clinician met with Courtney [name] for virtual therapy session and assessed for safety, sobriety, and  medication compliance.  Courtney [name] presented for appointment on time and was alert, oriented x5, with no evidence or self-report of active SI/HI or A/V H.  Courtney [name] reported compliance with medications.  Courtney [name] reported that she has been trying to limit alcohol and marijuana use to social encounters with friends.  Clinician inquired about Courtney [name]'s emotional ratings today, and whether she has had any significant changes in thoughts, feelings, or behavior since previous check-in.  Courtney [name] reported scores of 0/10 for depression, 2/10 for anxiety, 0/10 for anger/irritability and 0/10 for mania.  Courtney [name] denied any recent panic attacks.  Courtney [name] reported that a recent success was linking with a peer support specialist that has been helpful for linking her with resources.  She reported that a struggle has been trying to assist her boyfriend, whose mental health issues have become more apparent, and worried her.  Clinician utilized a handout on the subject with [name] to assist.  This handout offered strategies for helping a someone struggling with mental health issues while maintaining healthy boundaries to protect oneself.  Some of the suggestions offered included referring the individual for therapy, linking them with a psychiatrist to discuss medication appropriateness, being willing to listen to their concerns without judgement, giving reinforcement for positive changes made, offering additional assistance when possible, creating a low stress environment at home, and making plans together to look forward to. Clinician also pointed out how [name]'s own experience in learning to cope with her depression and anxiety could be perceived as a strength and offers her a chance to bond with partner during this difficult time.  Intervention was effective, as evidenced by Courtney [name] actively engaging in discussion on subject, reporting that she is proud of herself for  navigating this challenge carefully, as she  did suggest her boyfriend see a therapist and normalized the idea of getting help as a strength rather than weakness.  [name] reported that she believes he may be borderline as well, and this has made her reflect on progress she has made in the areas of communication, self-care, and boundaries.  [name] reported that she has established a boundary with him and she will not tolerate disrespect, or blaming for his condition potentially worsening if he doesn't get help.  She stated "I'm gonna continue to work on myself and avoid letting anyone stop my personal growth".  Clinician will continue to monitor.       Plan: Meet again virtually in 2 weeks.   Diagnosis: Bipolar I Disorder, mixed, moderate; Borderline personality disorder; PTSD; Cannabis Use Disorder, moderate; and ADHD, combined type.   Collaboration of Care:   No collaboration required at this time.                                                   Patient/Guardian was advised Release of Information must be obtained prior to any record release in order to collaborate their care with an outside provider. Patient/Guardian was advised if they have not already done so to contact the registration department to sign all necessary forms in order for [name] to release information regarding their care.    Consent: Patient/Guardian gives verbal consent for treatment and assignment of benefits for services provided during this visit. Patient/Guardian expressed understanding and agreed to proceed.  Courtney [name], [place], LCAS 07/27/23

## 2023-08-10 ENCOUNTER — Ambulatory Visit (HOSPITAL_COMMUNITY): Payer: MEDICAID | Admitting: Licensed Clinical Social Worker

## 2023-08-10 ENCOUNTER — Encounter (HOSPITAL_COMMUNITY): Payer: Self-pay

## 2023-08-10 ENCOUNTER — Telehealth (HOSPITAL_COMMUNITY): Payer: Self-pay | Admitting: Licensed Clinical Social Worker

## 2023-08-10 NOTE — Telephone Encounter (Signed)
Courtney [name] had a virtual therapy session scheduled today for 3pm.  Courtney [name] presented for appointment on time, but requested to cancel, as she had begun to feel sick yesterday and was feeling worse this afternoon.  She reported that she was unable to cancel sooner 24 hours in advance since yesterday was Sunday.  Clinician was agreeable to cancellation so that Courtney [name] could recover, and encouraged her to follow up with her PCP if condition does not improve.  Clinician also outreached front desk staff to inform of cancellation.    Courtney Stain, LCSW, LCAS 08/10/23

## 2023-08-18 ENCOUNTER — Ambulatory Visit (HOSPITAL_COMMUNITY): Payer: MEDICAID | Admitting: Licensed Clinical Social Worker

## 2023-08-18 DIAGNOSIS — F1220 Cannabis dependence, uncomplicated: Secondary | ICD-10-CM

## 2023-08-18 DIAGNOSIS — F3162 Bipolar disorder, current episode mixed, moderate: Secondary | ICD-10-CM | POA: Diagnosis not present

## 2023-08-18 DIAGNOSIS — F4310 Post-traumatic stress disorder, unspecified: Secondary | ICD-10-CM | POA: Diagnosis not present

## 2023-08-18 DIAGNOSIS — F902 Attention-deficit hyperactivity disorder, combined type: Secondary | ICD-10-CM

## 2023-08-18 DIAGNOSIS — F603 Borderline personality disorder: Secondary | ICD-10-CM

## 2023-08-18 NOTE — Progress Notes (Signed)
 Virtual Visit via Video Note  I connected with Courtney [name] on 08/18/23 at 3:07pm by video enabled telemedicine application and verified that I am speaking with the correct person using two identifiers.   I discussed the limitations, risks, security and privacy concerns of performing an evaluation and management service by video and the availability of in person appointments. I also discussed with the patient that there may be a patient responsible charge related to this service. The patient expressed understanding and agreed to proceed.   I discussed the assessment and treatment plan with the patient. The patient was provided an opportunity to ask questions and all were answered. The patient agreed with the plan and demonstrated an understanding of the instructions.   The patient was advised to call back or seek an in-person evaluation if the symptoms worsen or if the condition fails to improve as anticipated.   I provided 53 minutes of non-face-to-face time during this encounter.   Courtney Ricker, LCSW, LCAS ________________________ THERAPIST PROGRESS NOTE   Session Time: 3:07pm - 4:00pm    Location: Patient: Patient Home Provider: [facility]    Participation Level: Active   Behavioral Response: Alert, casually dressed, anxious mood/affect   Type of Therapy:  Individual Therapy   Treatment Goals addressed: Depression and Anxiety management; Medication management; Monitoring substance use   Progress Towards Goals: Progressing   Interventions: CBT, relapse prevention, supportive therapy      Summary: Courtney [name] is a 33 year old female that presented for therapy appointment and is diagnosed with Bipolar I Disorder, mixed, moderate; Borderline personality disorder; PTSD; Cannabis Use Disorder, moderate; and ADHD, combined type.        Suicidal/Homicidal: None; without intent or plan   Therapist Response: Clinician met with Courtney [name] for virtual therapy appointment and  assessed for safety, sobriety, and medication compliance.  Kayela presented for session 7 minutes late, but was alert, oriented x5, with no evidence or self-report of active SI/HI or A/V H.  Brynley reported compliance with medications.  Nadezhda reported that she has been continuing to work toward curbing overall substance use.  Clinician inquired about Courtney [name]'s current emotional ratings, and whether she has had any significant changes in thoughts, feelings, or behavior since last check-in.  Courtney [name] reported scores of 2/10 for depression, 4/10 for anxiety, 0/10 for anger/irritability and 0/10 for mania.  Courtney [name] denied any recent panic attacks.  Courtney [name] reported that a recent success was helping a friend do their hair, which highlighted a personal strength.  She reported that a struggle has been noticing cravings to use various substances like THC, alcohol or cocaine when she is experiencing emotional extremes, like a depressive episode last night.  Courtney [name] stated I was kinda moody and crying.  Clinician reviewed material on relapse prevention with Courtney [name] to assist.  Clinician covered a handout on justifications with Courtney [name] to aid in curbing substance abuse patterns when she is faced with stressors.  Several common relapse justification scenarios were presented for consideration, including blaming someone else, catastrophic events, using for specific purposes, or self-medicating unpleasant emotions such as anxiety, anger, loneliness or fear.  Clinician explored healthier alternatives for coping with challenges like this via use of positive self-talk, exercise, or speaking with a sober peer for support.  Clinician also discussed refusal skills that could be implemented if offered substances in the future by peers.  Intervention was effective, as evidenced by Courtney [name] actively engaging in discussion on subject, reporting that she is realizing that depending on how she  is feeling, she will  seek out specific substances to self-medicate, although this increases dependence, and tendency to 'mask' emotions rather than process them in healthy ways.  She reported that when things don't go her way, or someone lets or down or makes her feel abandoned, this can also increase cravings to use.  [name] reported that she is motivated to seek out healthier coping skills when faced with these emotions and situations, including journaling, hugging her cat, making jewelry, listening to music, or meditating.  Courtney [name] reported that she would also set healthier boundaries with users in her network, stating You can't be expected to flourish if you're surrounded by weeds. This is why I moved away from [place] .  Courtney [name] reported that she has made contact with a trauma/DBT therapist and she plans to transition treatment to new provider once an assessment appointment is complete.  Clinician expressed understanding and asked to be updated when possible to prepare for discharge.  Courtney [name] was agreeable to this.  Clinician will continue to monitor.       Plan: Meet again virtually in 2 weeks.   Diagnosis: Bipolar I Disorder, mixed, moderate; Borderline personality disorder; PTSD; Cannabis Use Disorder, moderate; and ADHD, combined type.   Collaboration of Care:   No collaboration required at this time.                                                   Patient/Guardian was advised Release of Information must be obtained prior to any record release in order to collaborate their care with an outside provider. Patient/Guardian was advised if they have not already done so to contact the registration department to sign all necessary forms in order for us  to release information regarding their care.    Consent: Patient/Guardian gives verbal consent for treatment and assignment of benefits for services provided during this visit. Patient/Guardian expressed understanding and agreed to proceed.  Courtney Ricker, LCSW,  LCAS 08/18/23

## 2023-09-15 ENCOUNTER — Ambulatory Visit (INDEPENDENT_AMBULATORY_CARE_PROVIDER_SITE_OTHER): Payer: MEDICAID | Admitting: Licensed Clinical Social Worker

## 2023-09-15 DIAGNOSIS — F603 Borderline personality disorder: Secondary | ICD-10-CM | POA: Diagnosis not present

## 2023-09-15 DIAGNOSIS — F902 Attention-deficit hyperactivity disorder, combined type: Secondary | ICD-10-CM

## 2023-09-15 DIAGNOSIS — F1220 Cannabis dependence, uncomplicated: Secondary | ICD-10-CM | POA: Diagnosis not present

## 2023-09-15 DIAGNOSIS — F3162 Bipolar disorder, current episode mixed, moderate: Secondary | ICD-10-CM | POA: Diagnosis not present

## 2023-09-15 DIAGNOSIS — F4310 Post-traumatic stress disorder, unspecified: Secondary | ICD-10-CM | POA: Diagnosis not present

## 2023-09-15 NOTE — Progress Notes (Signed)
 Virtual Visit via Video Note  I connected with Courtney [name] on 09/15/23 at 1:00pm by video enabled telemedicine application and verified that I am speaking with the correct person using two identifiers.   I discussed the limitations, risks, security and privacy concerns of performing an evaluation and management service by video and the availability of in person appointments. I also discussed with the patient that there may be a patient responsible charge related to this service. The patient expressed understanding and agreed to proceed.   I discussed the assessment and treatment plan with the patient. The patient was provided an opportunity to ask questions and all were answered. The patient agreed with the plan and demonstrated an understanding of the instructions.   The patient was advised to call back or seek an in-person evaluation if the symptoms worsen or if the condition fails to improve as anticipated.   I provided 1 hour of non-face-to-face time during this encounter.   Darleene Ricker, LCSW, LCAS ________________________ THERAPIST PROGRESS NOTE   Session Time: 1:00pm - 2:00pm    Location: Patient: Patient Home Provider: [facility]    Participation Level: Active   Behavioral Response: Alert, casually dressed, anxious mood/affect      Type of Therapy:  Individual Therapy   Treatment Goals addressed: Depression and Anxiety management; Medication management; Monitoring substance use; Maintaining exercise routine; Improving communication skills; Linking with trauma therapist; Following safety plan     Progress Towards Goals: Progressing    Interventions: CBT, treatment planning        Summary: Courtney [name] is a 33 year old female that presented for therapy appointment and is diagnosed with Bipolar I Disorder, mixed, moderate; Borderline personality disorder; PTSD; Cannabis Use Disorder, moderate; and ADHD, combined type.        Suicidal/Homicidal: None; without intent or  plan   Therapist Response: Clinician met with Courtney [name] for virtual therapy session and assessed for safety, sobriety, and medication compliance.  [name] presented for appointment on time and was alert, oriented x5, with no evidence or self-report of active SI/HI or A/V H.  Jadda reported compliance with medications.  Dellar reported that she has been continuing to work toward curbing overall substance use.  Clinician inquired about Courtney [name]'s emotional ratings today, and whether she has had any significant changes in thoughts, feelings, or behavior since previous check-in.  Courtney [name] reported scores of 0/10 for depression, 2/10 for anxiety, 0/10 for anger/irritability and 0/10 for mania.  Courtney [name] denied any recent panic attacks or outbursts.  Courtney [name] reported that a recent success was securing an initial appointment with a trauma therapist, which will lead her to transition from this providers care upon completion of clinical assessment.  She reported that she is both excited and anxious about this transition, as she hopes to continue with progress on treatment goals, but has 'trust issues'.  Clinician recommended revisiting treatment plan today to identify progress toward goals and any barriers that might impede success as she begins specialized treatment with new provider.  Courtney [name] was agreeable to this, so clinician collaborated with her to make updates as follows with her verbal consent:  Meet with clinician virtually once every 2 weeks for therapeutic support until initial assessment with new trauma therapist is completed;  Meet with psychiatrist once per month to address efficacy of medication and make adjustments as needed to regimen and/or dosage; Take medication daily as prescribed to reduce symptoms and improve overall daily functioning, setting daily reminder on phone for further reinforcement; Reduce depression  from average severity of 2/10 down to 0/10 over the next 90 days by  engaging in positive self-care activities for 2 hours each day, such as listening to music, watching movies, playing video games, doing makeup, cutting hair, and/or reading; Reduce anxiety from average severity of 3/10 down to 1/10, as well as maintain average panic attacks at 0 per month via utilization of 3-4 relaxation techniques daily such as mindful breathing, meditation, progressive muscle relaxation, grounding techniques, and/or positive visualizations; Continue working to become more independent in daily activities in order to increase sense of self-reliance, confidence, and curb dependency on other people; Identify 2-3 issues with communication skills that can be addressed to improve socialization with supports and reduce borderline tendencies within next 90 days; Exercise 4-5 times per week for 1 hour at a minimum each visit to improve both physical and mental well-being; Monitor substance use closely and consider engagement in [facility] for assistance in addressing overall impact upon mental health, motivation, and/or other goal progress; Identify a qualified trauma professional to begin seeing biweekly in order to receive treatment for PTSD symptoms within next 90 days; Voluntarily seek hospitalization with assistance from support system and/or medical professionals should SI/HI appear and safety of self or others is determined at risk due to development of intent, plan, and access to means necessary to carry out plan.  Progress is evidenced by Courtney [name]'s consistent attendance to therapy, following up with her prescribing doctor, and taking medication as prescribed.  She reported that she has also been able to build insight into triggers that influence manic episodes and use coping skills to manage these triggers more effectively. She reported that communication skills and assertiveness have improved, and relationships with her family appear to be better.  Courtney [name] reported that staying consistent with  self-care has reduced depression from previous level of 6/10 down to a 2/10 now.  She reported that use of coping skills learned from therapy has reduced anxiety down to 3/10 from a previous high of 5/10, and she has not had any panic attacks in several weeks. Katy reported that she is working out at least x4 days per week and trying to be more proactive about following a healthy diet to support this habit.  Layaan will need to stay active with her new therapist to monitor for sporadic polysubstance use and has been encouraged to continue strengthening relapse prevention plan, seek sober support, and avoid pattern of self-medication.  Chalyn stated I'm very happy these days.  This has taught me so much.  Clinician will continue to monitor until transition to new therapist is confirmed, and Phillipa will be discharged.       Plan: Robertta will transition to trauma therapist upon confirmation of attendance to initial assessment and be discharged from our practice afterward.   Diagnosis: Bipolar I Disorder, mixed, moderate; Borderline personality disorder; PTSD; Cannabis Use Disorder, moderate; and ADHD, combined type.   Collaboration of Care:   No collaboration required at this time.                                                   Patient/Guardian was advised Release of Information must be obtained prior to any record release in order to collaborate their care with an outside provider. Patient/Guardian was advised if they have not already done so to contact the registration department to  sign all necessary forms in order for us  to release information regarding their care.    Consent: Patient/Guardian gives verbal consent for treatment and assignment of benefits for services provided during this visit. Patient/Guardian expressed understanding and agreed to proceed.  Darleene Ricker, LCSW, LCAS 09/15/23

## 2023-09-29 ENCOUNTER — Ambulatory Visit (HOSPITAL_COMMUNITY): Payer: MEDICAID | Admitting: Licensed Clinical Social Worker

## 2023-10-01 ENCOUNTER — Ambulatory Visit (INDEPENDENT_AMBULATORY_CARE_PROVIDER_SITE_OTHER): Payer: MEDICAID | Admitting: Licensed Clinical Social Worker

## 2023-10-01 DIAGNOSIS — F3162 Bipolar disorder, current episode mixed, moderate: Secondary | ICD-10-CM | POA: Diagnosis not present

## 2023-10-01 DIAGNOSIS — F603 Borderline personality disorder: Secondary | ICD-10-CM | POA: Diagnosis not present

## 2023-10-01 DIAGNOSIS — F4310 Post-traumatic stress disorder, unspecified: Secondary | ICD-10-CM | POA: Diagnosis not present

## 2023-10-01 DIAGNOSIS — F1220 Cannabis dependence, uncomplicated: Secondary | ICD-10-CM

## 2023-10-01 DIAGNOSIS — F902 Attention-deficit hyperactivity disorder, combined type: Secondary | ICD-10-CM

## 2023-10-01 NOTE — Progress Notes (Signed)
Virtual Visit via Video Note  I connected with [place]ney [name] on 10/01/23 at 3:00pm by video enabled telemedicine application and verified that I am speaking with the correct person using two identifiers.   I discussed the limitations, risks, security and privacy concerns of performing an evaluation and management service by video and the availability of in person appointments. I also discussed with the patient that there may be a patient responsible charge related to this service. The patient expressed understanding and agreed to proceed.   I discussed the assessment and treatment plan with the patient. The patient was provided an opportunity to ask questions and all were answered. The patient agreed with the plan and demonstrated an understanding of the instructions.   The patient was advised to call back or seek an in-person evaluation if the symptoms worsen or if the condition fails to improve as anticipated.   I provided 30 minutes of non-face-to-face time during this encounter.   [name], LCSW, LCAS ________________________ THERAPIST PROGRESS NOTE   Session Time: 3:00pm - 3:30pm   Location: Patient: Patient Home Provider: Home Office    Participation Level: Active   Behavioral Response: Alert, casually dressed, euthymic mood/affect      Type of Therapy:  Individual Therapy   Treatment Goals addressed: Depression and Anxiety management; Medication management; Monitoring substance use; Linking with trauma therapist  Progress Towards Goals: Ongoing    Interventions: CBT, relapse prevention, problem solving      Summary: [place]ney [name] is a 33 year old female that presented for therapy appointment and is diagnosed with Bipolar I Disorder, mixed, moderate; Borderline personality disorder; PTSD; Cannabis Use Disorder, moderate; and ADHD, combined type.        Suicidal/Homicidal: None; without intent or plan   Therapist Response: Clinician met with [name] for virtual  therapy appointment and assessed for safety, sobriety, and medication compliance.  [name] presented for session on time and was alert, oriented x5, with no evidence or self-report of active SI/HI or A/V H.  [place]ney [name] reported that she has been smoking marijuana several times a day.  Clinician inquired about [name]'s current emotional ratings, and whether she has had any significant changes in thoughts, feelings, or behavior since last check-in.  [name] reported scores of 0/10 for depression, 1/10 for anxiety, 0/10 for anger/irritability and 0/10 for mania.  [name] denied any recent panic attacks or outbursts.  [name] reported that a recent success was starting to date someone new.  She reported that a struggle was drinking alcohol the other night to excess when visiting her boyfriend out of town, which led her to throw up, and forget to take her medication. Clinician discussed topic of maintaining a healthy sober support network with [place]ney [name].  Clinician explained importance of having healthy, positive supports in one's life either through connection to recovery groups, religious organizations/church, family, personal friends, or employment.  Clinician compared and contrasted healthy (i.e. mutual trust, honesty, respect, effort, safety, boundaries, etc) versus unhealthy qualities (i.e. manipulation, blaming, abusive behaviors, isolation, disrespect, etc) that could be present in these relationships and their overall impact upon her relapse prevention efforts.  Clinician assisted [name] in weighing costs versus benefits regarding whether or not to pursue a new romantic relationship at this point in her recovery.  Intervention effectiveness was mixed, as evidenced by [name] actively engaging in discussion on subject, reporting that she feels like this new relationship is positive for her despite recent drinking incident.  [name] reported that there are some red flags to be  mindful of,  including the fact that this person has a medical condition which could trigger her caretaking habit, and encourage codependency.  She reported that he does appear honest, good with communication, and not excessively 'needy', but she will remain cautious.  [place]ney [name] reported that visiting him in [place] has made her consider relocating to the area permanently due to accessibility of resources.  Clinician offered referral to [facility], noting that they may be able to link her with valuable resources like peer support and group therapy.  [name] reported that she would look into their website for more details, and asked to end session at halfway point due to feeling tired and unable to focus. Clinician will continue to monitor until transition to new therapist is confirmed, and [name] will be discharged.       Plan: [place]ney [name] will transition to trauma therapist upon confirmation of attendance to initial assessment and be discharged from our practice afterward.   Diagnosis: Bipolar I Disorder, mixed, moderate; Borderline personality disorder; PTSD; Cannabis Use Disorder, moderate; and ADHD, combined type.   Collaboration of Care:   No collaboration required at this time.                                                   Patient/Guardian was advised Release of Information must be obtained prior to any record release in order to collaborate their care with an outside provider. Patient/Guardian was advised if they have not already done so to contact the registration department to sign all necessary forms in order for [name] to release information regarding their care.    Consent: Patient/Guardian gives verbal consent for treatment and assignment of benefits for services provided during this visit. Patient/Guardian expressed understanding and agreed to proceed.  [name], LCSW, LCAS 10/01/23

## 2023-10-13 ENCOUNTER — Ambulatory Visit (INDEPENDENT_AMBULATORY_CARE_PROVIDER_SITE_OTHER): Payer: MEDICAID | Admitting: Licensed Clinical Social Worker

## 2023-10-13 DIAGNOSIS — F603 Borderline personality disorder: Secondary | ICD-10-CM

## 2023-10-13 DIAGNOSIS — F4310 Post-traumatic stress disorder, unspecified: Secondary | ICD-10-CM | POA: Diagnosis not present

## 2023-10-13 DIAGNOSIS — F3162 Bipolar disorder, current episode mixed, moderate: Secondary | ICD-10-CM | POA: Diagnosis not present

## 2023-10-13 DIAGNOSIS — F1220 Cannabis dependence, uncomplicated: Secondary | ICD-10-CM

## 2023-10-13 DIAGNOSIS — F902 Attention-deficit hyperactivity disorder, combined type: Secondary | ICD-10-CM

## 2023-10-13 NOTE — Progress Notes (Signed)
 Virtual Visit via Video Note  I connected with [place]ney [name] on 10/13/23 at 3:00pm by video enabled telemedicine application and verified that I am speaking with the correct person using two identifiers.   I discussed the limitations, risks, security and privacy concerns of performing an evaluation and management service by video and the availability of in person appointments. I also discussed with the patient that there may be a patient responsible charge related to this service. The patient expressed understanding and agreed to proceed.   I discussed the assessment and treatment plan with the patient. The patient was provided an opportunity to ask questions and all were answered. The patient agreed with the plan and demonstrated an understanding of the instructions.   The patient was advised to call back or seek an in-person evaluation if the symptoms worsen or if the condition fails to improve as anticipated.   I provided 31 minutes of non-face-to-face time during this encounter.   Courtney Stain, LCSW, LCAS ________________________ THERAPIST PROGRESS NOTE   Session Time: 3:00pm - 3:31pm   Location: Patient: Patient Home Provider: [facility]    Participation Level: Active   Behavioral Response: Alert, casually dressed, euthymic mood/affect     Type of Therapy:  Individual Therapy   Treatment Goals addressed: Depression and Anxiety management; Medication management; Monitoring substance use; Linking with trauma therapist; Following exercise routine   Progress Towards Goals: Progressing    Interventions: CBT: self-care assessment     Summary: Courtney [name] is a 32 year old female that presented for therapy appointment and is diagnosed with Bipolar I Disorder, mixed, moderate; Borderline personality disorder; PTSD; Cannabis Use Disorder, moderate; and ADHD, combined type.        Suicidal/Homicidal: None; without intent or plan   Therapist Response: Clinician met with  [name] for virtual therapy session and assessed for safety, sobriety, and medication compliance.  [name] presented for appointment on time and was alert, oriented x5, with no evidence or self-report of active SI/HI or A/V H.  [name] reported that she has been smoking marijuana x1-2 times a day.  Clinician inquired about [name]'s emotional ratings today, and whether she has had any significant changes in thoughts, feelings, or behavior since previous check-in.  [name] reported scores of 0/10 for depression, 1/10 for anxiety, 0/10 for anger/irritability and 0/10 for mania.  [name] denied any recent panic attacks or outbursts.  [name] reported that a recent success was making the decision to move [name] by the end of the month to stay with her boyfriend's family, stating "They are great.  Its been really good for me".  She reported that she has not heard back from the trauma therapist she was scheduled to start with, but plans to begin searching for new ones this week, and will continue therapy with current provider until then.  [name] reported that she has been working out more often, and trying to improve overall physical health.  Clinician introduced topic of self-care today.  Clinician explained how this can be defined as the things one does to maintain good health and improve well-being.  Clinician provided [name] with a self-care assessment form to complete.  This handout featured various sub-categories of self-care, including physical, psychological/emotional, social, spiritual, and professional.  [name] was asked to rank her engagement in the activities listed for each dimension on a scale of 1-3, with 1 indicating 'Poor', 2 indicating 'Ok', and 3 indicating 'Well'.  Clinician invited [name] to share results of her assessment, and inquired about  which areas of self-care she is doing well in, as well as areas that require attention, and how she plans to begin addressing  this during treatment.  Intervention was effective, as evidenced by [name] successfully completing first section (physical) of assessment and actively engaging in discussion on subject, reporting that she is excelling in areas such as maintaining personal hygiene, exercising, wearing clothes that make her feel good, and attending preventative medical appointments, but would benefit from focusing more on areas such as eating healthy foods, participating in fun activities, getting enough sleep, and resting when sick.  [name] reported that she would work to improve self-care deficits by cutting back on junk food that isn't good for her, moderating exercise to avoid overworking her muscles, planning to go hiking more often to get out of the house, adjusting sleep schedule to ensure that she isn't sleeping late into the afternoon, and being mindful of warning signs that indicate she needs more rest, such as fatigue and irritability.  [name] stated "I feel pretty good.  Self-care is a form of self-love". [name] requested to end session at halfway point due to fatigue from lack of sleep last night.  Clinician will continue to monitor.       Plan: [name] will followup for virtual therapy in 2 weeks.   Diagnosis: Bipolar I Disorder, mixed, moderate; Borderline personality disorder; PTSD; Cannabis Use Disorder, moderate; and ADHD, combined type.   Collaboration of Care:   No collaboration required at this time.                                                   Patient/Guardian was advised Release of Information must be obtained prior to any record release in order to collaborate their care with an outside provider. Patient/Guardian was advised if they have not already done so to contact the registration department to sign all necessary forms in order for [name] to release information regarding their care.    Consent: Patient/Guardian gives verbal consent for treatment and assignment of benefits for  services provided during this visit. Patient/Guardian expressed understanding and agreed to proceed.  Courtney Stain, LCSW, LCAS 10/13/23

## 2023-11-04 ENCOUNTER — Ambulatory Visit (HOSPITAL_COMMUNITY): Payer: MEDICAID | Admitting: Licensed Clinical Social Worker

## 2023-11-04 DIAGNOSIS — F603 Borderline personality disorder: Secondary | ICD-10-CM

## 2023-11-04 DIAGNOSIS — F3162 Bipolar disorder, current episode mixed, moderate: Secondary | ICD-10-CM

## 2023-11-04 DIAGNOSIS — F1220 Cannabis dependence, uncomplicated: Secondary | ICD-10-CM

## 2023-11-04 DIAGNOSIS — F902 Attention-deficit hyperactivity disorder, combined type: Secondary | ICD-10-CM

## 2023-11-04 DIAGNOSIS — F4310 Post-traumatic stress disorder, unspecified: Secondary | ICD-10-CM | POA: Diagnosis not present

## 2023-11-04 NOTE — Progress Notes (Signed)
 Virtual Visit via Video Note  I connected with [name] on 11/04/23 at 3:00pm by video enabled telemedicine application and verified that I am speaking with the correct person using two identifiers.   I discussed the limitations, risks, security and privacy concerns of performing an evaluation and management service by video and the availability of in person appointments. I also discussed with the patient that there may be a patient responsible charge related to this service. The patient expressed understanding and agreed to proceed.   I discussed the assessment and treatment plan with the patient. The patient was provided an opportunity to ask questions and all were answered. The patient agreed with the plan and demonstrated an understanding of the instructions.   The patient was advised to call back or seek an in-person evaluation if the symptoms worsen or if the condition fails to improve as anticipated.   I provided 50 minutes of non-face-to-face time during this encounter.   Courtney Stain, LCSW, LCAS ________________________ THERAPIST PROGRESS NOTE   Session Time: 3:00pm - 3:50pm  Location: Patient: Patient Home Provider: [facility]    Participation Level: Active   Behavioral Response: Alert, casually dressed, manic mood/affect    Type of Therapy:  Individual Therapy   Treatment Goals addressed: Depression and Anxiety management; Medication management; Monitoring substance use  Progress Towards Goals: Progessing   Interventions: CBT, problem solving, relapse prevention    Summary: [place]ney [name] is a 33 year old female that presented for therapy appointment and is diagnosed with Bipolar I Disorder, mixed, moderate; Borderline personality disorder; PTSD; Cannabis Use Disorder, moderate; and ADHD, combined type.        Suicidal/Homicidal: None; without intent or plan   Therapist Response: Clinician met with [name] for virtual therapy appointment and assessed for  safety, sobriety, and medication compliance.  [name] presented for session on time and was alert, oriented x5, with no evidence or self-report of active SI/HI or A/V H.  [name] reported that she has been smoking marijuana 1-2x times daily, and drinking 1-2x alcoholic drinks 1-2x per week.  Clinician inquired about [name]'s current emotional ratings, and whether she has had any significant changes in thoughts, feelings, or behavior since last check-in.  [name] reported scores of 0/10 for depression, 8/10 for anxiety, 0/10 for anger/irritability and 10/10 for mania.  [name] denied any recent panic attacks or outbursts, but reported that she has been feeling very anxious and on the verge of a panic attack today.  She reported that she also didn't sleep for a day.  Clinician inquired about whether [name] has modified her medication regimen at all, including addition of anything OTC.  [name] reported that she stopped taking her buspirone this week because she didn't feel like it was working, and she had been regularly taking an 'RSP thermogenic' supplement daily to help with exercise and weight loss.  Clinician inquired about whether [name] spoke with her doctor about discontinuing any medication, and importance of keeping providers up to date on efficacy of dosage.  [name] reported that she did not inform her doctor before making this decision.  Clinician provided psychoeducation on Buspirone to improve [name]'s medication compliance, including explanation on what it is intended to treat, how it regulates mood through adjustment of dopamine and serotonin levels, and risks posed to efficacy when combined with self-medication through substances like alcohol or illicit substances.  Clinician also utilized online resources to review potential side effects of the supplement mentioned, which included increased heart rate/blood pressure, nausea, insomnia,  anxiety, restlessness, pain, and more.   Intervention was effective, as evidenced by [name] reporting that learning more about how buspirone is intended to treat symptoms increased her motivation toward staying more consistent in taking it.  She reported that all side effects of the supplement are something she has struggled with at times, and will not take this anymore due to potential health risks.  Clinician encouraged [name] to be mindful of relapse justifications and avoid self-medication of mental health symptoms with alcohol or drugs due to historical issues that have been noted throughout treatment episode.  [name] reported that she would be more mindful of substance use and try to cut back, stating "Essentially I'm self sabotaging.  Now I know what has been going on with me".  She reported that she would outreach her doctor today and admit to ongoing substance use.  Clinician will continue to monitor.       Plan: [name] will followup for virtual therapy in 2 weeks.   Diagnosis: Bipolar I Disorder, mixed, moderate; Borderline personality disorder; PTSD; Cannabis Use Disorder, moderate; and ADHD, combined type.   Collaboration of Care:   No collaboration required at this time.                                                   Patient/Guardian was advised Release of Information must be obtained prior to any record release in order to collaborate their care with an outside provider. Patient/Guardian was advised if they have not already done so to contact the registration department to sign all necessary forms in order for [name] to release information regarding their care.    Consent: Patient/Guardian gives verbal consent for treatment and assignment of benefits for services provided during this visit. Patient/Guardian expressed understanding and agreed to proceed.  Courtney Stain, LCSW, LCAS 11/04/23

## 2023-11-18 ENCOUNTER — Ambulatory Visit (INDEPENDENT_AMBULATORY_CARE_PROVIDER_SITE_OTHER): Payer: MEDICAID | Admitting: Licensed Clinical Social Worker

## 2023-11-18 DIAGNOSIS — F3162 Bipolar disorder, current episode mixed, moderate: Secondary | ICD-10-CM

## 2023-11-18 DIAGNOSIS — F1220 Cannabis dependence, uncomplicated: Secondary | ICD-10-CM | POA: Diagnosis not present

## 2023-11-18 DIAGNOSIS — F902 Attention-deficit hyperactivity disorder, combined type: Secondary | ICD-10-CM

## 2023-11-18 DIAGNOSIS — F4310 Post-traumatic stress disorder, unspecified: Secondary | ICD-10-CM | POA: Diagnosis not present

## 2023-11-18 DIAGNOSIS — F603 Borderline personality disorder: Secondary | ICD-10-CM | POA: Diagnosis not present

## 2023-11-18 NOTE — Progress Notes (Signed)
 Virtual Visit via Video Note  I connected with [name] on 11/18/23 at 3:00pm by video enabled telemedicine application and verified that I am speaking with the correct person using two identifiers.   I discussed the limitations, risks, security and privacy concerns of performing an evaluation and management service by video and the availability of in person appointments. I also discussed with the patient that there may be a patient responsible charge related to this service. The patient expressed understanding and agreed to proceed.   I discussed the assessment and treatment plan with the patient. The patient was provided an opportunity to ask questions and all were answered. The patient agreed with the plan and demonstrated an understanding of the instructions.   The patient was advised to call back or seek an in-person evaluation if the symptoms worsen or if the condition fails to improve as anticipated.   I provided 1 hour of non-face-to-face time during this encounter.   [name], LCSW, LCAS ________________________ THERAPIST PROGRESS NOTE   Session Time: 3:00pm - 4:00pm  Location: Patient: Patient Home Provider: Home Office    Participation Level: Active   Behavioral Response: Alert, casually dressed, euthymic mood/affect   Type of Therapy:  Individual Therapy   Treatment Goals addressed: Depression and Anxiety management; Medication management; Monitoring substance use  Progress Towards Goals: Progressing   Interventions: CBT, problem solving    Summary: [place]ney [name] is a 33 year old female that presented for therapy appointment and is diagnosed with Bipolar I Disorder, mixed, moderate; Borderline personality disorder; PTSD; Cannabis Use Disorder, moderate; and ADHD, combined type.        Suicidal/Homicidal: None; without intent or plan   Therapist Response: Clinician met with [name] for virtual therapy session and assessed for safety, sobriety, and  medication compliance.  [name] presented for appointment on time and was alert, oriented x5, with no evidence or self-report of active SI/HI or A/V H.  [name] reported that she has been drinking wine or beer once every 2 weeks, and smoking marijuana 2-3x per day.  She reported medication compliance.  Clinician inquired about [name]'s emotional ratings today, and whether she has had any significant changes in thoughts, feelings, or behavior since previous check-in.  [name] reported scores of 0/10 for depression, 1/10 for anxiety, 1/10 for anger/irritability and 0/10 for mania.  [name] denied any recent panic attacks, but she did almost have an outburst last week.  [name] stated "I shut down, like an internalized outburst.  I'm not sure what caused it".  Clinician virtually shared a CBT handout with [name] to guide reflection on recent events which may have triggered this crisis.  This handout explained how the cognitive triad model could be applied to difficult situations in order to demonstrate the relationship between one's thoughts, emotions, and behaviors, with emphasis placed on changing one's thinking to alleviate unpleasant emotions and curb maladaptive behaviors (i.e. substance use, self-harm).  Alternative approaches were also discussed that could have been implemented early on to deescalate, including importance of communicating thoughts and feelings to key supports in network to challenge any distorted thinking.  Intervention was effective, as evidenced by [name] actively engaging in discussion on subject, and reporting that this helped increase insight into triggers which contributed to this event, as well as additional coping strategies to explore when faced with similar circumstances.  [name] reported that since moving in with her new boyfriend, she has been trying to get used to his norms, including the fact that he isn't as  clean or orderly.  She reported that seeing the  dishes in the sink was upsetting, but she began to think of herself as a burden for caring, and then felt empty.  [place]ney [name] reported that rather than stonewalling, she will be more open and express these thoughts and feelings with her boyfriend when triggered in order to strengthen communication and understanding.  [name] stated "I need to reassure myself.  Have conversations.  Be honest and open about problems when they arise in the relationship".  Clinician will continue to monitor.       Plan: [name] will followup for virtual therapy in 2 weeks.   Diagnosis: Bipolar I Disorder, mixed, moderate; Borderline personality disorder; PTSD; Cannabis Use Disorder, moderate; and ADHD, combined type.   Collaboration of Care:   No collaboration required at this time.                                                   Patient/Guardian was advised Release of Information must be obtained prior to any record release in order to collaborate their care with an outside provider. Patient/Guardian was advised if they have not already done so to contact the registration department to sign all necessary forms in order for [name] to release information regarding their care.    Consent: Patient/Guardian gives verbal consent for treatment and assignment of benefits for services provided during this visit. Patient/Guardian expressed understanding and agreed to proceed.  [name], LCSW, LCAS 11/18/23

## 2023-12-01 ENCOUNTER — Ambulatory Visit (INDEPENDENT_AMBULATORY_CARE_PROVIDER_SITE_OTHER): Payer: MEDICAID | Admitting: Licensed Clinical Social Worker

## 2023-12-01 DIAGNOSIS — F3162 Bipolar disorder, current episode mixed, moderate: Secondary | ICD-10-CM

## 2023-12-01 DIAGNOSIS — F1210 Cannabis abuse, uncomplicated: Secondary | ICD-10-CM | POA: Diagnosis not present

## 2023-12-01 DIAGNOSIS — F603 Borderline personality disorder: Secondary | ICD-10-CM

## 2023-12-01 DIAGNOSIS — F902 Attention-deficit hyperactivity disorder, combined type: Secondary | ICD-10-CM

## 2023-12-01 DIAGNOSIS — F4310 Post-traumatic stress disorder, unspecified: Secondary | ICD-10-CM | POA: Diagnosis not present

## 2023-12-01 DIAGNOSIS — F1220 Cannabis dependence, uncomplicated: Secondary | ICD-10-CM

## 2023-12-01 NOTE — Progress Notes (Signed)
 Virtual Visit via Video Note  I connected with Courtney [name] on 12/01/23 at 3:00pm by video enabled telemedicine application and verified that I am speaking with the correct person using two identifiers.   I discussed the limitations, risks, security and privacy concerns of performing an evaluation and management service by video and the availability of in person appointments. I also discussed with the patient that there may be a patient responsible charge related to this service. The patient expressed understanding and agreed to proceed.   I discussed the assessment and treatment plan with the patient. The patient was provided an opportunity to ask questions and all were answered. The patient agreed with the plan and demonstrated an understanding of the instructions.   The patient was advised to call back or seek an in-person evaluation if the symptoms worsen or if the condition fails to improve as anticipated.   I provided 32 minutes of non-face-to-face time during this encounter.   Desmond Florida, LCSW, LCAS ________________________ THERAPIST PROGRESS NOTE   Session Time: 3:00pm - 3:32pm   Location: Patient: Patient Home Provider: [facility]    Participation Level: Active   Behavioral Response: Alert, casually dressed, anxious mood/affect   Type of Therapy:  Individual Therapy   Treatment Goals addressed: Depression and Anxiety management; Medication management; Monitoring substance use  Progress Towards Goals: Progressing   Interventions: CBT: core beliefs    Summary: Courtney [name] is a 33 year old female that presented for therapy appointment and is diagnosed with Bipolar I Disorder, mixed, moderate; Borderline personality disorder; PTSD; Cannabis Use Disorder, moderate; and ADHD, combined type.        Suicidal/Homicidal: None; without intent or plan   Therapist Response: Clinician met with Courtney [name] for virtual therapy appointment and assessed for safety, sobriety, and  medication compliance.  Courtney [name] presented for session on time and was alert, oriented x5, with no evidence or self-report of active SI/HI or A/V H.  Courtney [name] reported that she has been drinking 1 beer every 2 weeks, and smoking "A couple of bowls" of marijuana every other day.  She reported medication compliance.  Clinician inquired about Courtney [name]'s current emotional ratings, and whether she has had any significant changes in thoughts, feelings, or behavior since last check-in.  Courtney [name] reported scores of 0/10 for depression, 3/10 for anxiety, 1/10 for irritability and 0/10 for mania.  Courtney [name] denied any recent panic attacks or outbursts.  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  She reported that she is also participating in [employer] for extra income.  She reported that a struggle has been dealing with anxiety about her relationship, stating "I worry that I don't deserve to be happy".    Clinician covered topic of core beliefs with Courtney [name] today.  Clinician shared a handout on the subject, which explained how everyone looks at the world differently, and two people can have the same experience, but have different interpretations of what happened.  Courtney [name] was encouraged to think of these like sunglasses with different "shades" influencing perception towards positive or negative outcomes.  Examples of negative core beliefs were provided, such as "I'm unlovable", "I'm not good enough", and "I'm a bad person".  Courtney [name] was asked to share which one(s) she could relate to, and then identify evidence which contradicts these beliefs.  Clinician also provided psychoeducation on positive affirmations today, and explained how these are positive statements which can be spoken  out loud or recited mentally to challenge negative thoughts and/or core beliefs to improve mood and outlook each day.  Intervention was  effective, as evidenced by Courtney [name] successfully participating in discussion on the subject and reporting that she could relate to several negative core beliefs such as "Its too good to be true", "I'll end up alone", and "I'm too damaged".  Courtney [name] was able to successfully challenge these beliefs, reporting that she is a good person that several people in her support network appreciate and would not abandon due to positive behaviors she shows each day, including going out of her way to help when possible, giving encouragement, and trying to lift their spirits.  Courtney [name] also reported that she would plan to talk more positively to herself when ruminating on these negative beliefs, stating "I need to remind myself that I'm worthy, I am loved, I matter, and I'm not a bad person".  Courtney [name] asked to end session at halfway point in order to do a [employer] delivery.  Clinician will continue to monitor.       Plan: Courtney [name] will followup for virtual therapy in 2 weeks.   Diagnosis: Bipolar I Disorder, mixed, moderate; Borderline personality disorder; PTSD; Cannabis Use Disorder, moderate; and ADHD, combined type.   Collaboration of Care:   No collaboration required at this time.                                                   Patient/Guardian was advised Release of Information must be obtained prior to any record release in order to collaborate their care with an outside provider. Patient/Guardian was advised if they have not already done so to contact the registration department to sign all necessary forms in order for us  to release information regarding their care.    Consent: Patient/Guardian gives verbal consent for treatment and assignment of benefits for services provided during this visit. Patient/Guardian expressed understanding and agreed to proceed.  Desmond Florida, LCSW, LCAS 12/01/23

## 2023-12-15 ENCOUNTER — Ambulatory Visit (INDEPENDENT_AMBULATORY_CARE_PROVIDER_SITE_OTHER): Payer: MEDICAID | Admitting: Licensed Clinical Social Worker

## 2023-12-15 DIAGNOSIS — F4310 Post-traumatic stress disorder, unspecified: Secondary | ICD-10-CM

## 2023-12-15 DIAGNOSIS — F1220 Cannabis dependence, uncomplicated: Secondary | ICD-10-CM

## 2023-12-15 DIAGNOSIS — F3162 Bipolar disorder, current episode mixed, moderate: Secondary | ICD-10-CM

## 2023-12-15 DIAGNOSIS — F603 Borderline personality disorder: Secondary | ICD-10-CM | POA: Diagnosis not present

## 2023-12-15 DIAGNOSIS — F902 Attention-deficit hyperactivity disorder, combined type: Secondary | ICD-10-CM
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
## 2023-12-28 ENCOUNTER — Ambulatory Visit (HOSPITAL_COMMUNITY): Payer: MEDICAID | Admitting: Licensed Clinical Social Worker

## 2023-12-28 DIAGNOSIS — F603 Borderline personality disorder: Secondary | ICD-10-CM

## 2023-12-28 DIAGNOSIS — F1220 Cannabis dependence, uncomplicated: Secondary | ICD-10-CM

## 2023-12-28 DIAGNOSIS — F902 Attention-deficit hyperactivity disorder, combined type: Secondary | ICD-10-CM

## 2023-12-28 DIAGNOSIS — F3162 Bipolar disorder, current episode mixed, moderate: Secondary | ICD-10-CM

## 2023-12-28 DIAGNOSIS — F4310 Post-traumatic stress disorder, unspecified: Secondary | ICD-10-CM
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
## 2024-01-11 ENCOUNTER — Ambulatory Visit (HOSPITAL_BASED_OUTPATIENT_CLINIC_OR_DEPARTMENT_OTHER): Payer: MEDICAID | Admitting: Family

## 2024-01-11 DIAGNOSIS — F3162 Bipolar disorder, current episode mixed, moderate: Secondary | ICD-10-CM

## 2024-01-11 MED ORDER — OXCARBAZEPINE 300 MG PO TABS: 300.0000 mg | ORAL_TABLET | Freq: Two times a day (BID) | ORAL | 0 refills | Status: DC

## 2024-01-11 MED ORDER — VRAYLAR 4.5 MG PO CAPS: 4.5000 mg | ORAL_CAPSULE | Freq: Every day | ORAL | 0 refills | Status: DC

## 2024-01-11 MED ORDER — DULOXETINE HCL 30 MG PO CPEP: 60.0000 mg | ORAL_CAPSULE | Freq: Every day | ORAL | 0 refills | Status: DC

## 2024-01-11 NOTE — Addendum Note (Signed)
 Addended by: Levester Reagin on: 01/11/2024 06:09 PM   Modules accepted: Level of Service
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
## 2024-01-13 ENCOUNTER — Ambulatory Visit (INDEPENDENT_AMBULATORY_CARE_PROVIDER_SITE_OTHER): Payer: MEDICAID | Admitting: Licensed Clinical Social Worker

## 2024-01-13 DIAGNOSIS — F1220 Cannabis dependence, uncomplicated: Secondary | ICD-10-CM | POA: Diagnosis not present

## 2024-01-13 DIAGNOSIS — F603 Borderline personality disorder: Secondary | ICD-10-CM | POA: Diagnosis not present

## 2024-01-13 DIAGNOSIS — F902 Attention-deficit hyperactivity disorder, combined type: Secondary | ICD-10-CM

## 2024-01-13 DIAGNOSIS — F4310 Post-traumatic stress disorder, unspecified: Secondary | ICD-10-CM | POA: Diagnosis not present

## 2024-01-13 DIAGNOSIS — F3162 Bipolar disorder, current episode mixed, moderate: Secondary | ICD-10-CM
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
## 2024-01-15 ENCOUNTER — Encounter: Payer: Self-pay | Admitting: Family Medicine

## 2024-01-27 ENCOUNTER — Ambulatory Visit (HOSPITAL_COMMUNITY): Payer: MEDICAID | Admitting: Licensed Clinical Social Worker

## 2024-02-10 ENCOUNTER — Telehealth (HOSPITAL_COMMUNITY): Payer: MEDICAID | Admitting: Family

## 2024-02-10 ENCOUNTER — Ambulatory Visit (HOSPITAL_COMMUNITY): Payer: MEDICAID | Admitting: Licensed Clinical Social Worker

## 2024-02-10 DIAGNOSIS — F3162 Bipolar disorder, current episode mixed, moderate: Secondary | ICD-10-CM | POA: Diagnosis not present

## 2024-02-10 DIAGNOSIS — F4310 Post-traumatic stress disorder, unspecified: Secondary | ICD-10-CM

## 2024-02-10 MED ORDER — DULOXETINE HCL 30 MG PO CPEP: 60.0000 mg | ORAL_CAPSULE | Freq: Every day | ORAL | 0 refills | Status: DC

## 2024-02-10 MED ORDER — OXCARBAZEPINE 300 MG PO TABS: 300.0000 mg | ORAL_TABLET | Freq: Two times a day (BID) | ORAL | 0 refills | Status: DC

## 2024-02-10 MED ORDER — VRAYLAR 4.5 MG PO CAPS: 4.5000 mg | ORAL_CAPSULE | Freq: Every day | ORAL | 0 refills | Status: DC
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
## 2024-02-24 ENCOUNTER — Ambulatory Visit (HOSPITAL_COMMUNITY): Payer: MEDICAID | Admitting: Licensed Clinical Social Worker

## 2024-03-09 ENCOUNTER — Ambulatory Visit (HOSPITAL_COMMUNITY): Payer: MEDICAID | Admitting: Licensed Clinical Social Worker

## 2024-03-23 ENCOUNTER — Ambulatory Visit (HOSPITAL_COMMUNITY): Payer: MEDICAID | Admitting: Licensed Clinical Social Worker

## 2024-03-23 DIAGNOSIS — F1220 Cannabis dependence, uncomplicated: Secondary | ICD-10-CM

## 2024-03-23 DIAGNOSIS — F4310 Post-traumatic stress disorder, unspecified: Secondary | ICD-10-CM

## 2024-03-23 DIAGNOSIS — F902 Attention-deficit hyperactivity disorder, combined type: Secondary | ICD-10-CM

## 2024-03-23 DIAGNOSIS — F603 Borderline personality disorder: Secondary | ICD-10-CM

## 2024-03-23 DIAGNOSIS — F3162 Bipolar disorder, current episode mixed, moderate: Secondary | ICD-10-CM

## 2024-03-23 NOTE — Progress Notes (Signed)
 Virtual Visit via Video Note  I connected with Courtney [name] on 03/23/24 at 3:00pm by video enabled telemedicine application and verified that I am speaking with the correct person using two identifiers.   I discussed the limitations, risks, security and privacy concerns of performing an evaluation and management service by video and the availability of in person appointments. I also discussed with the patient that there may be a patient responsible charge related to this service. The patient expressed understanding and agreed to proceed.   I discussed the assessment and treatment plan with the patient. The patient was provided an opportunity to ask questions and all were answered. The patient agreed with the plan and demonstrated an understanding of the instructions.   The patient was advised to call back or seek an in-person evaluation if the symptoms worsen or if the condition fails to improve as anticipated.   I provided 41 minutes of non-face-to-face time during this encounter.   Darleene Ricker, LCSW, LCAS ________________________ THERAPIST PROGRESS NOTE   Session Time: 3:00pm - 3:41pm   Location: Patient: Patient Home Provider: Home Office    Participation Level: Active   Behavioral Response: Alert, casually dressed, euthymic mood/affect   Type of Therapy:  Individual Therapy   Treatment Goals addressed: Depression and Anxiety management; Medication management; Monitoring substance use  Progress Towards Goals: Progressing   Interventions: CBT, psychoeducation on healthy boundaries    Summary: Courtney [name] is a 33 year old female that presented for therapy appointment and is diagnosed with Bipolar I Disorder, mixed, moderate; Borderline personality disorder; PTSD; Cannabis Use Disorder, moderate; and ADHD, combined type.        Suicidal/Homicidal: None; without intent or plan   Therapist Response: Clinician met with Courtney [name] for virtual therapy session and assessed for  safety, sobriety, and medication compliance.  Courtney [name] presented for appointment on time and was alert, oriented x5, with no evidence or self-report of active SI/HI or A/V H.  Courtney [name] reported that she has drank light amounts of wine a few days a week, and continued to smoke marijuana 1-2x per week.  She reported that she tapered off of her behavioral medication for one week, and then restarted when she experienced significant side effects.  She reported that she did inform her provider of this.  Clinician inquired about Courtney [name]'s emotional ratings today, and whether she has had any significant changes in thoughts, feelings, or behavior since previous check-in.  Courtney [name] reported scores of 0/10 for depression, 1/10 for anxiety, 0/10 for anger/irritability and 0/10 for mania.  Clinician inquired about Courtney [name]'s recent lack of engagement in therapy.  Courtney [name] reported that when she went off medication, she also decided to pause therapy in order to 'challenge' herself.  She reported that she regretted this decision, does not intend to do this again and has scheduled several followup therapy sessions.  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 She reported that a struggle has been navigating boundaries with supports in her life, as she had an outburst toward her mother recently when the subject of political beliefs came up.  Clinician discussed topic of boundaries with Courtney [name] today to assist.  Clinician utilized a handout on the subject which featured various categories present within typical relationship, including physical, intellectual, emotional, sexual, material, and time.  Healthy versus unhealthy traits were presented for each category to gauge quality of boundaries within the relationship (  i.e. respecting a partner's unique thoughts and ideas in regard to healthy intellectual  boundaries instead of dismissing or belittling one's beliefs).  Clinician inquired about areas of concern within the present relationship's boundaries, and discussed strategies for assertively communicating these issues to seek resolution.  Intervention was effective, as evidenced by Courtney [name]'s active engagement in discussion on subject, and clarification on boundaries that need to be set with supports to avoid conflict.  Courtney [name] reported that with her mother, and another new friend she made at the gym, it would benefit their relationships to avoid talking about politics or religion since they disagree with each other's stances.  She reported that she also needs to clarify physical boundary preferences to avoid being triggered if someone were to touch or hug her from behind without warning, and bring up past trauma.  Courtney [name] reported that she would plan to bring these up directly with her mother and friend, noting that she already apologized to her mother for recent argument that happened where her mother hung up on her.  Clinician will continue to monitor.       Plan: Courtney [name] will followup for virtual therapy in 2 weeks.   Diagnosis: Bipolar I Disorder, mixed, moderate; Borderline personality disorder; PTSD; Cannabis Use Disorder, moderate; and ADHD, combined type.   Collaboration of Care:   No collaboration required at this time.                                                   Patient/Guardian was advised Release of Information must be obtained prior to any record release in order to collaborate their care with an outside provider. Patient/Guardian was advised if they have not already done so to contact the registration department to sign all necessary forms in order for us  to release information regarding their care.    Consent: Patient/Guardian gives verbal consent for treatment and assignment of benefits for services provided during this visit. Patient/Guardian expressed understanding and  agreed to proceed.  Darleene Ricker, [place], LCAS 03/23/24

## 2024-04-06 ENCOUNTER — Ambulatory Visit (INDEPENDENT_AMBULATORY_CARE_PROVIDER_SITE_OTHER): Payer: MEDICAID | Admitting: Licensed Clinical Social Worker

## 2024-04-06 DIAGNOSIS — F4310 Post-traumatic stress disorder, unspecified: Secondary | ICD-10-CM

## 2024-04-06 DIAGNOSIS — F1220 Cannabis dependence, uncomplicated: Secondary | ICD-10-CM | POA: Diagnosis not present

## 2024-04-06 DIAGNOSIS — F603 Borderline personality disorder: Secondary | ICD-10-CM

## 2024-04-06 DIAGNOSIS — F3162 Bipolar disorder, current episode mixed, moderate: Secondary | ICD-10-CM | POA: Diagnosis not present

## 2024-04-06 DIAGNOSIS — F902 Attention-deficit hyperactivity disorder, combined type: Secondary | ICD-10-CM

## 2024-04-06 NOTE — Progress Notes (Signed)
 Virtual Visit via Video Note  I connected with Courtney [name] on 04/06/24 at 3:00pm by video enabled telemedicine application and verified that I am speaking with the correct person using two identifiers.   I discussed the limitations, risks, security and privacy concerns of performing an evaluation and management service by video and the availability of in person appointments. I also discussed with the patient that there may be a patient responsible charge related to this service. The patient expressed understanding and agreed to proceed.   I discussed the assessment and treatment plan with the patient. The patient was provided an opportunity to ask questions and all were answered. The patient agreed with the plan and demonstrated an understanding of the instructions.   The patient was advised to call back or seek an in-person evaluation if the symptoms worsen or if the condition fails to improve as anticipated.   I provided 1 hour of non-face-to-face time during this encounter.   Darleene Ricker, LCSW, LCAS ________________________ THERAPIST PROGRESS NOTE   Session Time: 3:00pm - 4:00pm   Location: Patient: Patient Home Provider: Home Office    Participation Level: Active   Behavioral Response: Alert, casually dressed, euthymic mood/affect   Type of Therapy:  Individual Therapy   Treatment Goals addressed: Depression and Anxiety management; Medication management; Monitoring substance use  Progress Towards Goals: Progressing   Interventions: CBT, psychoeducation on borderline personality disorder, supportive therapy    Summary: Courtney [name] is a 33 year old female that presented for therapy appointment and is diagnosed with Bipolar I Disorder, mixed, moderate; Borderline personality disorder; PTSD; Cannabis Use Disorder, moderate; and ADHD, combined type.        Suicidal/Homicidal: None; without intent or plan   Therapist Response: Clinician met with Courtney [name] for virtual  therapy appointment and assessed for safety, sobriety, and medication compliance.  Courtney [name] presented for session on time and was alert, oriented x5, with no evidence or self-report of active SI/HI or A/V H.  She reported compliance with medication.  Courtney [name] reported that she has not smoked marijuana for one week due to not having any available.  She reported that she is drinking alcohol x1 per week.  Clinician inquired about Courtney [name]'s current emotional ratings, and whether she has had any significant changes in thoughts, feelings, or behavior since last check-in.  Courtney [name] reported scores of 0/10 for depression, 2/10 for anxiety, 1/10 for anger/irritability and 0/10 for mania.  She denied any recent panic attacks or outbursts.  Courtney [name] reported that a success has been continuing to work at her new job, and enjoying it for the most part, aside from an issue with roaches she came across when cleaning that almost triggered a panic attack. Courtney [name] reported that she also had an interview with [facility] recently to get connected for services.  She reported that she wants to get connected with a peer support specialist, but she got upset during the assessment because the interviewer did not diagnose her with borderline personality disorder.  She stated It felt very invalidating.  Clinician provided psychoeducation on borderline personality disorder according to DSM V TR criteria. This included screening for past and present issues Courtney [name] may have struggled with, such as frantic efforts to avoid real/imagined abandonment, unstable self-image, impulsivity in damaging behavior such as substance use, or spending, feelings of emptiness, intense anger, and more.  Clinician assisted Courtney [name] in identifying progress she has made toward coping with this disorder in healthier ways, as well as problem areas that could be targeting with specialized  treatment such as DBT.  Intervention was effective, as evidenced by  Courtney [name]'s active engagement in discussion on subject, reporting that she has personal experience with all criteria related to borderline personality disorder.  Courtney [name] reported that although she would prefer to have never had to deal with this disorder, she is glad to have made progress in working toward a state of remission, and hoped that linking with a DBT therapist will help her learn more about this condition and how to manage it more effectively.  Courtney [name] asked if records could be transferred to [facility] if they can provider her with a referral to a specialist.  Clinician was agreeable to this, and provided her with information on how to contact our records department for secure transfer of confidential medical records.  Clinician encouraged Courtney [name] to keep our office updated on any changes in treatment so that we can best assist her with potential transition of care.  Courtney [name] reported that she would.  Clinician will continue to monitor.       Plan: Courtney [name] will followup for virtual therapy in 2 weeks.   Diagnosis: Bipolar I Disorder, mixed, moderate; Borderline personality disorder; PTSD; Cannabis Use Disorder, moderate; and ADHD, combined type.   Collaboration of Care:   No collaboration required at this time.                                                   Patient/Guardian was advised Release of Information must be obtained prior to any record release in order to collaborate their care with an outside provider. Patient/Guardian was advised if they have not already done so to contact the registration department to sign all necessary forms in order for us  to release information regarding their care.    Consent: Patient/Guardian gives verbal consent for treatment and assignment of benefits for services provided during this visit. Patient/Guardian expressed understanding and agreed to proceed.  Darleene Ricker, LCSW, LCAS 04/06/24

## 2024-04-20 ENCOUNTER — Ambulatory Visit (HOSPITAL_COMMUNITY): Payer: MEDICAID | Admitting: Licensed Clinical Social Worker

## 2024-04-20 DIAGNOSIS — F902 Attention-deficit hyperactivity disorder, combined type: Secondary | ICD-10-CM

## 2024-04-20 DIAGNOSIS — F3162 Bipolar disorder, current episode mixed, moderate: Secondary | ICD-10-CM | POA: Diagnosis not present

## 2024-04-20 DIAGNOSIS — F603 Borderline personality disorder: Secondary | ICD-10-CM | POA: Diagnosis not present

## 2024-04-20 DIAGNOSIS — F4310 Post-traumatic stress disorder, unspecified: Secondary | ICD-10-CM | POA: Diagnosis not present

## 2024-04-20 DIAGNOSIS — F1220 Cannabis dependence, uncomplicated: Secondary | ICD-10-CM

## 2024-04-20 NOTE — Progress Notes (Signed)
 Virtual Visit via Video Note  I connected with Courtney [name] on 04/20/24 at 3:00pm by video enabled telemedicine application and verified that I am speaking with the correct person using two identifiers.   I discussed the limitations, risks, security and privacy concerns of performing an evaluation and management service by video and the availability of in person appointments. I also discussed with the patient that there may be a patient responsible charge related to this service. The patient expressed understanding and agreed to proceed.   I discussed the assessment and treatment plan with the patient. The patient was provided an opportunity to ask questions and all were answered. The patient agreed with the plan and demonstrated an understanding of the instructions.   The patient was advised to call back or seek an in-person evaluation if the symptoms worsen or if the condition fails to improve as anticipated.   I provided 42 minutes of non-face-to-face time during this encounter.   Darleene Ricker, LCSW, LCAS ________________________ THERAPIST PROGRESS NOTE   Session Time: 3:00pm - 3:42PM   Location: Patient: Patient Home Provider: [facility]    Participation Level: Active   Behavioral Response: Alert, casually dressed, euthymic mood/affect   Type of Therapy:  Individual Therapy   Treatment Goals addressed: Depression and Anxiety management; Medication management; Monitoring substance use; Maintaining employment   Progress Towards Goals: Progressing   Interventions: CBT, communication skills    Summary: Courtney [name] is a 33 year old female that presented for therapy appointment and is diagnosed with Bipolar I Disorder, mixed, moderate; Borderline personality disorder; PTSD; Cannabis Use Disorder, moderate; and ADHD, combined type.        Suicidal/Homicidal: None; without intent or plan   Therapist Response: Clinician met with Courtney [name] for virtual therapy session and  assessed for safety, sobriety, and medication compliance.  Courtney [name] presented for appointment on time and was alert, oriented x5, with no evidence or self-report of active SI/HI or A/V H.  She reported compliance with medication.  Courtney [name] reported that she has been reducing overall marijuana and alcohol use.  Clinician inquired about Courtney [name]'s emotional ratings today, and whether she has had any significant changes in thoughts, feelings, or behavior since previous check-in.  Courtney [name] reported scores of 2/10 for depression, 2/10 for anxiety, 0/10 for anger/irritability and 0/10 for mania.  She denied any recent panic attacks or outbursts.  Courtney [name] reported that a struggle was getting sent home from work early the other day due to breaking a rule she was unaware of.  She reported that she considered leaving the job and finding a different one, but her partner encouraged her to try to work this out with staff.  Clinician reviewed material with Courtney [name] today on communication skills which could be utilized to increase understanding and support within the workplace.  Clinician presented a handout on 'soft startups' which offered suggestions on how Courtney [name] could address a problem assertively with her employer and coworkers, including tips such as choosing an appropriate time/setting, being mindful of maintaining gentle tone, volume and language, while avoiding triggering nonverbals such as rolling eyes, as well as utilizing "I" statements to express feelings, focusing on one problem at a time, and being respectful.  Intervention was effective, as evidenced by Courtney [name]'s active engagement in discussion on subject, and receptiveness to suggestions offered in improving communication, reporting that rather than abruptly leave this position, she will plan to speak first with her supervisor, express how this situation made her feel, agree to make changes to avoid  further disciplinary action, and set a boundary with  the employee that 'ratted her out' to avoid conflict.  Clinician will continue to monitor.       Plan: Courtney [name] will followup for virtual therapy in 2 weeks.   Diagnosis: Bipolar I Disorder, mixed, moderate; Borderline personality disorder; PTSD; Cannabis Use Disorder, moderate; and ADHD, combined type.   Collaboration of Care:   No collaboration required at this time.                                                   Patient/Guardian was advised Release of Information must be obtained prior to any record release in order to collaborate their care with an outside provider. Patient/Guardian was advised if they have not already done so to contact the registration department to sign all necessary forms in order for us  to release information regarding their care.    Consent: Patient/Guardian gives verbal consent for treatment and assignment of benefits for services provided during this visit. Patient/Guardian expressed understanding and agreed to proceed.  Darleene Ricker, LCSW, LCAS 04/20/24

## 2024-05-04 ENCOUNTER — Ambulatory Visit (HOSPITAL_COMMUNITY): Payer: MEDICAID | Admitting: Licensed Clinical Social Worker

## 2024-05-09 ENCOUNTER — Telehealth (HOSPITAL_COMMUNITY): Payer: MEDICAID | Admitting: Family

## 2024-05-09 DIAGNOSIS — F3162 Bipolar disorder, current episode mixed, moderate: Secondary | ICD-10-CM

## 2024-05-09 MED ORDER — HYDROXYZINE HCL 10 MG PO TABS: 10.0000 mg | ORAL_TABLET | Freq: Three times a day (TID) | ORAL | 0 refills | Status: AC | PRN

## 2024-05-09 MED ORDER — VRAYLAR 4.5 MG PO CAPS: 4.5000 mg | ORAL_CAPSULE | Freq: Every day | ORAL | 0 refills | Status: DC

## 2024-05-09 MED ORDER — OXCARBAZEPINE 300 MG PO TABS: 300.0000 mg | ORAL_TABLET | Freq: Two times a day (BID) | ORAL | 0 refills | Status: DC

## 2024-05-09 MED ORDER — DULOXETINE HCL 30 MG PO CPEP: 60.0000 mg | ORAL_CAPSULE | Freq: Every day | ORAL | 0 refills | Status: DC
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
## 2024-05-18 ENCOUNTER — Ambulatory Visit (INDEPENDENT_AMBULATORY_CARE_PROVIDER_SITE_OTHER): Payer: MEDICAID | Admitting: Licensed Clinical Social Worker

## 2024-05-18 DIAGNOSIS — F3162 Bipolar disorder, current episode mixed, moderate: Secondary | ICD-10-CM | POA: Diagnosis not present

## 2024-05-18 DIAGNOSIS — F603 Borderline personality disorder: Secondary | ICD-10-CM | POA: Diagnosis not present

## 2024-05-18 DIAGNOSIS — F4310 Post-traumatic stress disorder, unspecified: Secondary | ICD-10-CM | POA: Diagnosis not present

## 2024-05-18 DIAGNOSIS — F902 Attention-deficit hyperactivity disorder, combined type: Secondary | ICD-10-CM

## 2024-05-18 DIAGNOSIS — F1220 Cannabis dependence, uncomplicated: Secondary | ICD-10-CM | POA: Diagnosis not present

## 2024-05-18 NOTE — Progress Notes (Signed)
 Virtual Visit via Video Note  I connected with Courtney [name] on 05/18/24 at 3:00pm by video enabled telemedicine application and verified that I am speaking with the correct person using two identifiers.   I discussed the limitations, risks, security and privacy concerns of performing an evaluation and management service by video and the availability of in person appointments. I also discussed with the patient that there may be a patient responsible charge related to this service. The patient expressed understanding and agreed to proceed.   I discussed the assessment and treatment plan with the patient. The patient was provided an opportunity to ask questions and all were answered. The patient agreed with the plan and demonstrated an understanding of the instructions.   The patient was advised to call back or seek an in-person evaluation if the symptoms worsen or if the condition fails to improve as anticipated.   I provided 50 minutes of non-face-to-face time during this encounter.   Darleene Ricker, LCSW, LCAS ________________________ THERAPIST PROGRESS NOTE   Session Time: 3:00pm - 3:50pm   Location: Patient: Patient Home Provider: [facility] Office    Participation Level: Active   Behavioral Response: Alert, casually dressed, anxious mood/affect   Type of Therapy:  Individual Therapy   Treatment Goals addressed: Depression and Anxiety management; Medication management; Monitoring substance use; Maintaining employment   Progress Towards Goals: Progressing   Interventions: CBT, problem solving    Summary: Courtney [name] is a 33 year old female that presented for therapy appointment and is diagnosed with Bipolar I Disorder, mixed, moderate; Borderline personality disorder; PTSD; Cannabis Use Disorder, moderate; and ADHD, combined type.        Suicidal/Homicidal: None; without intent or plan   Therapist Response: Clinician met with Courtney [name] for virtual therapy appointment and  assessed for safety, sobriety, and medication compliance.  Courtney [name] presented for session on time and was alert, oriented x5, with no evidence or self-report of active SI/HI or A/V H.  She reported compliance with medication.  Courtney [name] reported that she has been limiting overall marijuana and alcohol use to 1-2x per week.  Clinician inquired about Courtney [name]'s current emotional ratings, and whether she has had any significant changes in thoughts, feelings, or behavior since last check-in.  Courtney [name] reported scores of 3/10 for depression, 4/10 for anxiety, 4/10 for irritability and 5/10 for mania.  She denied any recent outbursts.  Courtney [name] reported that a recent struggle was quitting her job today.  She reported that she has noticed increased panic attacks, anxiety, and irritability, which she attributed to excessive job stress.  Clinician covered topic of burnout with Courtney [name] to assist.  Clinician explained how burnout is a state of emotional, mental, and physical exhaustion caused by excessive and prolonged work related stress.  Clinician explained how the negative effects of burnout manifest in all aspects of life, including home, work and social life.  Clinician went through a checklist of different physical, emotional and behavioral burnout signs that could indicate her work life balance is off, and inquired about which ones she could identify with at present.  Clinician also discussed importance of maintaining a healthy self-care routine, and inquired about positive activities that Courtney [name] may have been neglecting, and needs to prioritize more as she recovers from recent work stress.  Intervention was effective, as evidenced by Courtney [name]'s active engagement in discussion on subject, and identification of numerous burnout signs suggesting that this job was a trigger for recent mood changes.  Courtney [name] reported that these signs included feeling tired  and drained, lowered immunity, frequent headaches,  compulsive eating, excessive sleep, self-doubt, feelings of detachment, lack of motivation, and procrastination.  Courtney [name] reported that she will take a break from job hunting and try to spend more time on self-care activities to recuperate, including catching up on household chores she has put off, making jewelry, exercising, meditation, and attending local events such as 'poetry slams'.  Courtney [name] stated I think it was the job.  I think I pushed myself too much.  The money wasn't worth my peace of mind.  Clinician will continue to monitor.       Plan: Courtney [name] will followup for virtual therapy in 2 weeks.   Diagnosis: Bipolar I Disorder, mixed, moderate; Borderline personality disorder; PTSD; Cannabis Use Disorder, moderate; and ADHD, combined type.   Collaboration of Care:   No collaboration required at this time.                                                   Patient/Guardian was advised Release of Information must be obtained prior to any record release in order to collaborate their care with an outside provider. Patient/Guardian was advised if they have not already done so to contact the registration department to sign all necessary forms in order for us  to release information regarding their care.    Consent: Patient/Guardian gives verbal consent for treatment and assignment of benefits for services provided during this visit. Patient/Guardian expressed understanding and agreed to proceed.  Darleene Ricker, LCSW, LCAS 05/18/24

## 2024-05-25 ENCOUNTER — Telehealth (HOSPITAL_COMMUNITY): Payer: MEDICAID | Admitting: Family

## 2024-05-25 DIAGNOSIS — F603 Borderline personality disorder: Secondary | ICD-10-CM

## 2024-05-25 DIAGNOSIS — F3162 Bipolar disorder, current episode mixed, moderate: Secondary | ICD-10-CM

## 2024-05-25 MED ORDER — VRAYLAR 4.5 MG PO CAPS: 4.5000 mg | ORAL_CAPSULE | Freq: Every day | ORAL | 0 refills | Status: AC

## 2024-05-25 MED ORDER — DULOXETINE HCL 60 MG PO CPEP: 60.0000 mg | ORAL_CAPSULE | Freq: Every day | ORAL | 0 refills | Status: AC

## 2024-05-25 MED ORDER — OXCARBAZEPINE 600 MG PO TABS: 600.0000 mg | ORAL_TABLET | Freq: Two times a day (BID) | ORAL | 0 refills | Status: AC
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
## 2024-06-01 ENCOUNTER — Ambulatory Visit (HOSPITAL_COMMUNITY): Payer: MEDICAID | Admitting: Licensed Clinical Social Worker

## 2024-06-01 ENCOUNTER — Encounter (HOSPITAL_COMMUNITY): Payer: Self-pay

## 2024-06-01 ENCOUNTER — Telehealth (HOSPITAL_COMMUNITY): Payer: Self-pay | Admitting: Licensed Clinical Social Worker

## 2024-06-01 NOTE — Telephone Encounter (Signed)
 Courtney [name] had a virtual therapy appointment scheduled today for 3pm.  Courtney [name] presented on time, but reported that she was not feeling well, and asked to cancel.  Clinician permitted cancellation due to sickness, and encouraged her to follow up for next available appointment when she is feeling better.  Courtney [name] was agreeable to this.  Clinician informed front desk staff of appointment cancellation due to illness.    Darleene Ricker, LCSW, LCAS 06/01/24

## 2024-06-05 ENCOUNTER — Other Ambulatory Visit (HOSPITAL_COMMUNITY): Payer: Self-pay | Admitting: Family

## 2024-06-15 ENCOUNTER — Ambulatory Visit (HOSPITAL_COMMUNITY): Payer: MEDICAID | Admitting: Licensed Clinical Social Worker

## 2024-06-27 ENCOUNTER — Telehealth (HOSPITAL_COMMUNITY): Payer: MEDICAID | Admitting: Family

## 2024-06-29 ENCOUNTER — Ambulatory Visit (HOSPITAL_COMMUNITY): Payer: MEDICAID | Admitting: Licensed Clinical Social Worker

## 2024-06-29 ENCOUNTER — Encounter (HOSPITAL_COMMUNITY): Payer: Self-pay

## 2024-07-22 ENCOUNTER — Other Ambulatory Visit (HOSPITAL_COMMUNITY): Payer: Self-pay | Admitting: Family

## 2024-08-02 ENCOUNTER — Encounter (HOSPITAL_COMMUNITY): Payer: Self-pay | Admitting: Licensed Clinical Social Worker

## 2024-08-02 ENCOUNTER — Telehealth (HOSPITAL_COMMUNITY): Payer: Self-pay | Admitting: Licensed Clinical Social Worker

## 2024-08-02 NOTE — Telephone Encounter (Signed)
 Per patient, will no longer see provider due to moving. Dismissal letter sent on 08/02/2024 via mychart.

## 2024-08-02 NOTE — Telephone Encounter (Signed)
 Clinician outreached Courtney [name] today by phone at 8:42am to offer availability in schedule for a therapy session.  Courtney [name] answered this phone call and reported that she has moved to [place]  and is in the process of changing providers.  Clinician reminded Courtney [name] that due to this provider only being licensed in [place], she would need to be discharged from services in our office.  Courtney [name] reported that she understood and would outreach our office again if she returns to the area and needs to restart treatment.  Clinician informed front desk staff of need to discharge client from care.    Darleene Ricker, [place], LCAS 08/02/24
